# Patient Record
Sex: Female | Born: 1940 | Race: White | Hispanic: No | Marital: Married | Smoking: Never smoker
Health system: Southern US, Community
[De-identification: ages and names within clinical notes are randomized; demographics above are authoritative.]

## PROBLEM LIST (undated history)

## (undated) DIAGNOSIS — N3090 Cystitis, unspecified without hematuria: Secondary | ICD-10-CM

## (undated) DIAGNOSIS — E669 Obesity, unspecified: Secondary | ICD-10-CM

## (undated) DIAGNOSIS — Z9889 Other specified postprocedural states: Secondary | ICD-10-CM

## (undated) DIAGNOSIS — B379 Candidiasis, unspecified: Secondary | ICD-10-CM

## (undated) DIAGNOSIS — C50919 Malignant neoplasm of unspecified site of unspecified female breast: Secondary | ICD-10-CM

## (undated) DIAGNOSIS — R112 Nausea with vomiting, unspecified: Secondary | ICD-10-CM

## (undated) DIAGNOSIS — T4145XA Adverse effect of unspecified anesthetic, initial encounter: Secondary | ICD-10-CM

## (undated) DIAGNOSIS — T8859XA Other complications of anesthesia, initial encounter: Secondary | ICD-10-CM

## (undated) DIAGNOSIS — E039 Hypothyroidism, unspecified: Secondary | ICD-10-CM

## (undated) DIAGNOSIS — M069 Rheumatoid arthritis, unspecified: Secondary | ICD-10-CM

## (undated) DIAGNOSIS — K219 Gastro-esophageal reflux disease without esophagitis: Secondary | ICD-10-CM

## (undated) DIAGNOSIS — M1990 Unspecified osteoarthritis, unspecified site: Secondary | ICD-10-CM

## (undated) DIAGNOSIS — T7840XA Allergy, unspecified, initial encounter: Secondary | ICD-10-CM

## (undated) DIAGNOSIS — C801 Malignant (primary) neoplasm, unspecified: Secondary | ICD-10-CM

## (undated) DIAGNOSIS — I5189 Other ill-defined heart diseases: Secondary | ICD-10-CM

## (undated) DIAGNOSIS — R0600 Dyspnea, unspecified: Secondary | ICD-10-CM

## (undated) DIAGNOSIS — R000 Tachycardia, unspecified: Secondary | ICD-10-CM

## (undated) DIAGNOSIS — IMO0001 Reserved for inherently not codable concepts without codable children: Secondary | ICD-10-CM

## (undated) DIAGNOSIS — I10 Essential (primary) hypertension: Secondary | ICD-10-CM

## (undated) DIAGNOSIS — M797 Fibromyalgia: Secondary | ICD-10-CM

## (undated) DIAGNOSIS — G609 Hereditary and idiopathic neuropathy, unspecified: Principal | ICD-10-CM

## (undated) HISTORY — DX: Other ill-defined heart diseases: I51.89

## (undated) HISTORY — PX: OTHER SURGICAL HISTORY: SHX169

## (undated) HISTORY — DX: Tachycardia, unspecified: R00.0

## (undated) HISTORY — PX: FINGER SURGERY: SHX640

## (undated) HISTORY — DX: Essential (primary) hypertension: I10

## (undated) HISTORY — PX: EYE SURGERY: SHX253

## (undated) HISTORY — DX: Reserved for inherently not codable concepts without codable children: IMO0001

## (undated) HISTORY — PX: BREAST SURGERY: SHX581

## (undated) HISTORY — PX: LUMBAR FUSION: SHX111

## (undated) HISTORY — DX: Malignant (primary) neoplasm, unspecified: C80.1

## (undated) HISTORY — DX: Rheumatoid arthritis, unspecified: M06.9

## (undated) HISTORY — DX: Obesity, unspecified: E66.9

## (undated) HISTORY — PX: TONSILLECTOMY: SUR1361

## (undated) HISTORY — PX: LUMBAR LAMINECTOMY: SHX95

## (undated) HISTORY — DX: Gastro-esophageal reflux disease without esophagitis: K21.9

## (undated) HISTORY — DX: Hereditary and idiopathic neuropathy, unspecified: G60.9

## (undated) HISTORY — PX: BELPHAROPTOSIS REPAIR: SHX369

---

## 1999-02-23 ENCOUNTER — Other Ambulatory Visit: Admission: RE | Admit: 1999-02-23 | Discharge: 1999-02-23 | Payer: Self-pay | Admitting: Obstetrics and Gynecology

## 2000-03-15 ENCOUNTER — Other Ambulatory Visit: Admission: RE | Admit: 2000-03-15 | Discharge: 2000-03-15 | Payer: Self-pay | Admitting: *Deleted

## 2001-03-19 ENCOUNTER — Other Ambulatory Visit: Admission: RE | Admit: 2001-03-19 | Discharge: 2001-03-19 | Payer: Self-pay | Admitting: *Deleted

## 2002-04-03 ENCOUNTER — Other Ambulatory Visit: Admission: RE | Admit: 2002-04-03 | Discharge: 2002-04-03 | Payer: Self-pay | Admitting: *Deleted

## 2003-07-06 ENCOUNTER — Other Ambulatory Visit: Admission: RE | Admit: 2003-07-06 | Discharge: 2003-07-06 | Payer: Self-pay | Admitting: *Deleted

## 2005-01-24 HISTORY — PX: COLONOSCOPY: SHX174

## 2005-02-22 ENCOUNTER — Ambulatory Visit (HOSPITAL_COMMUNITY): Admission: RE | Admit: 2005-02-22 | Discharge: 2005-02-22 | Payer: Self-pay | Admitting: Gastroenterology

## 2005-02-22 ENCOUNTER — Encounter (INDEPENDENT_AMBULATORY_CARE_PROVIDER_SITE_OTHER): Payer: Self-pay | Admitting: *Deleted

## 2006-11-14 ENCOUNTER — Encounter: Admission: RE | Admit: 2006-11-14 | Discharge: 2006-11-14 | Payer: Self-pay | Admitting: Family Medicine

## 2006-12-06 ENCOUNTER — Encounter: Admission: RE | Admit: 2006-12-06 | Discharge: 2006-12-06 | Payer: Self-pay | Admitting: Family Medicine

## 2007-03-12 ENCOUNTER — Encounter (INDEPENDENT_AMBULATORY_CARE_PROVIDER_SITE_OTHER): Payer: Self-pay | Admitting: Specialist

## 2007-03-12 ENCOUNTER — Ambulatory Visit (HOSPITAL_BASED_OUTPATIENT_CLINIC_OR_DEPARTMENT_OTHER): Admission: RE | Admit: 2007-03-12 | Discharge: 2007-03-12 | Payer: Self-pay | Admitting: Orthopedic Surgery

## 2008-01-09 ENCOUNTER — Ambulatory Visit: Payer: Self-pay | Admitting: Oncology

## 2010-01-31 ENCOUNTER — Encounter: Admission: RE | Admit: 2010-01-31 | Discharge: 2010-01-31 | Payer: Self-pay | Admitting: Neurosurgery

## 2010-06-03 ENCOUNTER — Encounter: Admission: RE | Admit: 2010-06-03 | Discharge: 2010-06-03 | Payer: Self-pay | Admitting: Internal Medicine

## 2010-10-26 ENCOUNTER — Ambulatory Visit: Payer: Self-pay | Admitting: Genetic Counselor

## 2011-04-13 NOTE — Op Note (Signed)
NAMESTARLYN, DROGE             ACCOUNT NO.:  [id number]   MEDICAL RECORD NO.:  [id number]          PATIENT TYPE:  AMB   LOCATION:  ENDO                         FACILITY:  [facility]   PHYSICIAN:  Bernette Redbird, M.D.   DATE OF BIRTH:  04/25/1941   DATE OF PROCEDURE:  02/22/2005  DATE OF DISCHARGE:                                 OPERATIVE REPORT   PROCEDURE:  Upper endoscopy with biopsies.   INDICATIONS:  70 year old female with intensified reflux symptoms recently,  and having necessitated the use of Prilosec OTC in the past few months  whereas previously, over-the-counter remedies were sufficient.   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   PROCEDURE:  The nature, purpose and risks of the procedure had been  discussed with the patient who provided written consent. Sedation was  fentanyl 60 mcg and Versed 6 milligrams IV without arrhythmias or  desaturation. The Olympus small-caliber adult video endoscope was passed  under direct vision. The vocal cords looked normal. The esophagus was  readily entered and had normal mucosa except at the GE junction where there  appeared to be a 1-cm tongue of Barrett's mucosa extending from the region  of the lower esophageal sphincter. I could not get the distal esophagus to  relax fully to determine whether this was actually just some slight  irregularity of the Z-line, versus a true tongue of Barrett's mucosa, so I  took a couple of biopsies from it to look for evidence of intestinal  metaplasia. No ring, stricture, varices, infection or neoplasia were  observed in the esophagus nor was there any hiatal hernia.   The stomach contained no significant residual and had normal mucosa without  evidence of gastritis, erosions, ulcers, polyps or masses and the pylorus,  duodenal bulb and proximal second duodenum looked normal. Retroflexed  viewing of the cardia was unremarkable.   The patient tolerated the procedure well and there  were no apparent  complications.   IMPRESSION:  1.  Reflux, without active inflammation seen at the time of this exam      (530.81).  2.  Possible short tongue of columnar mucosa (? Barrett's esophagus), path      pending.   PLAN:  Await pathology results. In the meantime, continue PPI therapy as  needed to control symptoms.      RB/MEDQ  D:  02/22/2005  T:  02/22/2005  Job:  [id number]   cc:   Donia Guiles, M.D.  [address] [place]  [place] [address]  Fax: [phone]

## 2011-04-13 NOTE — Op Note (Signed)
[name], [name]             ACCOUNT NO.:  [id number]   MEDICAL RECORD NO.:  [id number]          PATIENT TYPE:  AMB   LOCATION:  ENDO                         FACILITY:  [facility]   PHYSICIAN:  Bernette Redbird, M.D.   DATE OF BIRTH:  04/23/1941   DATE OF PROCEDURE:  02/22/2005  DATE OF DISCHARGE:                                 OPERATIVE REPORT   PROCEDURE:  Colonoscopy.   INDICATIONS:  Screening for colon cancer in a standard risk individual  without risk factors or symptoms. A flexible sigmoidoscopy several years ago  was reportedly negative.   FINDINGS:  Normal exam to the cecum.   PROCEDURE:  The nature, purpose and risks of the procedure had been  discussed with the patient who provided written consent. Sedation for this  procedure and the upper endoscopy which preceded it totalled fentanyl 80 mcg  and Versed 8 mg IV without arrhythmias or desaturation. Perianal exam was  unremarkable. Despite the fact that the patient had had what sounds like  some prolapsed hemorrhoids during the course of her prep last night.   The Olympus adult video colonoscope was advanced around the colon to the  cecum as identified by clear visualization of the appendiceal orifice and  pullback was then performed. The quality of prep was excellent and it was  felt that all areas were well seen.   This was a normal examination. No polyps, cancer, colitis, vascular  malformations or diverticulosis were noted and retroflexion in the rectum  and reinspection of the rectum was unremarkable. No biopsies were obtained.  The patient tolerated the procedure well and there no apparent  complications.   IMPRESSION:  Normal screening colonoscopy in a patient without worrisome  risk factors or symptoms (V76.51).   PLAN:  Follow-up sigmoidoscopy (versus colonoscopy) in five years for  continued screening.      RB/MEDQ  D:  02/22/2005  T:  02/22/2005  Job:  [id number]   cc:   Donia Guiles, M.D.  [address] [place]  [place] [address]  Fax: [phone]

## 2011-04-13 NOTE — Op Note (Signed)
NAMEMADALYNE, HUSK             ACCOUNT NO.:  [id number]   MEDICAL RECORD NO.:  [id number]          PATIENT TYPE:  AMB   LOCATION:  DSC                          FACILITY:  [facility]   PHYSICIAN:  Cindee Salt, M.D.       DATE OF BIRTH:  April 27, 1941   DATE OF PROCEDURE:  03/12/2007  DATE OF DISCHARGE:                               OPERATIVE REPORT   PREOPERATIVE DIAGNOSIS:  Mucoid cyst left index finger.   POSTOPERATIVE DIAGNOSIS:  Mucoid cyst left index finger.   OPERATION:  Excision mucoid cyst left index finger.   SURGEON:  Cindee Salt, M.D.   ASSISTANT:  Carolyne Fiscal R.N.   ANESTHESIA:  Forearm based IV regional.   HISTORY:  The patient is a 70 year old female with a history large cyst  over the dorsal aspect distal interphalangeal joint left index finger.  She is desirous of removal.  She is aware of risks and complications  including infection, recurrence, injury to arteries, nerves, tendons  incomplete relief of symptoms, dystrophy, possibility of arthritic  changes, stiffness of the joint following the surgery.  She is desirous  of proceeding to have this excised in the joint debrided in effort to  prevent recurrence.  In the preoperative area questions were encouraged  and answered.  The extremity marked by both the patient and surgeon.  Antibiotic given.   PROCEDURE:  The patient is brought to the operating room where a forearm  based IV regional anesthetic was carried out without difficulty.  She  was prepped using DuraPrep, supine position, left arm free.  A  curvilinear incision was made mid laterally, carried across dorsal  aspect of the DIP joint, carried down through subcutaneous tissue.  Bleeders were electrocauterized.  The cyst was immediately encountered.  With blunt and sharp dissection, this was dissected free and sent to  pathology.  The joint was then opened on its radial aspect.  Large  osteophyte was present.  This was removed with a rongeur.  A synovectomy  performed.  The joint debrided with the rongeur.  This was then  irrigated.  The skin was then closed interrupted 5-0 Vicryl Rapide  suture.  A sterile compressive dressing splint to the finger applied.  The patient tolerated the procedure well, was taken to the recovery  observation in satisfactory condition.  She is discharged home to return  to [facility] [place] in one week on Vicodin.           ______________________________  Cindee Salt, M.D.     GK/MEDQ  D:  03/12/2007  T:  03/12/2007  Job:  [id number]   cc:   Donia Guiles, M.D.

## 2012-06-25 ENCOUNTER — Other Ambulatory Visit: Payer: Self-pay | Admitting: Neurosurgery

## 2012-06-25 DIAGNOSIS — M5137 Other intervertebral disc degeneration, lumbosacral region: Secondary | ICD-10-CM

## 2012-06-25 DIAGNOSIS — M47817 Spondylosis without myelopathy or radiculopathy, lumbosacral region: Secondary | ICD-10-CM

## 2012-06-25 DIAGNOSIS — IMO0002 Reserved for concepts with insufficient information to code with codable children: Secondary | ICD-10-CM

## 2012-06-25 DIAGNOSIS — M4310 Spondylolisthesis, site unspecified: Secondary | ICD-10-CM

## 2012-06-26 ENCOUNTER — Ambulatory Visit
Admission: RE | Admit: 2012-06-26 | Discharge: 2012-06-26 | Disposition: A | Discharge status: Home / Self Care | Payer: Medicare Other | Source: Ambulatory Visit | Attending: Neurosurgery | Admitting: Neurosurgery

## 2012-06-26 ENCOUNTER — Other Ambulatory Visit: Payer: Self-pay | Admitting: Neurosurgery

## 2012-06-26 DIAGNOSIS — M4310 Spondylolisthesis, site unspecified: Secondary | ICD-10-CM

## 2012-06-26 DIAGNOSIS — IMO0002 Reserved for concepts with insufficient information to code with codable children: Secondary | ICD-10-CM

## 2012-06-26 DIAGNOSIS — M5137 Other intervertebral disc degeneration, lumbosacral region: Secondary | ICD-10-CM

## 2012-06-26 DIAGNOSIS — M47817 Spondylosis without myelopathy or radiculopathy, lumbosacral region: Secondary | ICD-10-CM

## 2012-07-01 ENCOUNTER — Other Ambulatory Visit: Payer: Self-pay

## 2012-07-02 ENCOUNTER — Ambulatory Visit
Admission: RE | Admit: 2012-07-02 | Discharge: 2012-07-02 | Disposition: A | Discharge status: Home / Self Care | Payer: Medicare Other | Source: Ambulatory Visit | Attending: Neurosurgery | Admitting: Neurosurgery

## 2012-07-02 DIAGNOSIS — M4310 Spondylolisthesis, site unspecified: Secondary | ICD-10-CM

## 2012-07-02 DIAGNOSIS — M5137 Other intervertebral disc degeneration, lumbosacral region: Secondary | ICD-10-CM

## 2012-07-02 DIAGNOSIS — M47817 Spondylosis without myelopathy or radiculopathy, lumbosacral region: Secondary | ICD-10-CM

## 2012-07-02 DIAGNOSIS — IMO0002 Reserved for concepts with insufficient information to code with codable children: Secondary | ICD-10-CM

## 2012-07-02 MED ORDER — GADOBENATE DIMEGLUMINE 529 MG/ML IV SOLN
15.0000 mL | Freq: Once | INTRAVENOUS | Status: AC | PRN
  Administered 2012-07-02: 15 mL via INTRAVENOUS

## 2012-12-29 ENCOUNTER — Ambulatory Visit: Payer: Medicare Other | Attending: Internal Medicine | Admitting: Physical Therapy

## 2012-12-29 DIAGNOSIS — R269 Unspecified abnormalities of gait and mobility: Secondary | ICD-10-CM | POA: Insufficient documentation

## 2012-12-29 DIAGNOSIS — IMO0001 Reserved for inherently not codable concepts without codable children: Secondary | ICD-10-CM | POA: Insufficient documentation

## 2014-03-31 ENCOUNTER — Encounter: Payer: Self-pay | Admitting: General Surgery

## 2014-03-31 DIAGNOSIS — I498 Other specified cardiac arrhythmias: Secondary | ICD-10-CM | POA: Insufficient documentation

## 2014-03-31 DIAGNOSIS — R0602 Shortness of breath: Secondary | ICD-10-CM

## 2014-03-31 DIAGNOSIS — I10 Essential (primary) hypertension: Secondary | ICD-10-CM | POA: Insufficient documentation

## 2014-04-07 ENCOUNTER — Ambulatory Visit: Payer: Medicare Other | Admitting: Cardiology

## 2014-04-13 ENCOUNTER — Ambulatory Visit: Payer: Medicare Other | Admitting: Cardiology

## 2014-04-15 ENCOUNTER — Ambulatory Visit (INDEPENDENT_AMBULATORY_CARE_PROVIDER_SITE_OTHER): Payer: Medicare Other | Admitting: Cardiology

## 2014-04-15 ENCOUNTER — Encounter: Payer: Self-pay | Admitting: Cardiology

## 2014-04-15 VITALS — BP 144/83 | HR 83 | Ht 59.0 in | Wt 169.0 lb

## 2014-04-15 DIAGNOSIS — I5189 Other ill-defined heart diseases: Secondary | ICD-10-CM

## 2014-04-15 DIAGNOSIS — I10 Essential (primary) hypertension: Secondary | ICD-10-CM

## 2014-04-15 DIAGNOSIS — I519 Heart disease, unspecified: Secondary | ICD-10-CM

## 2014-04-15 DIAGNOSIS — R000 Tachycardia, unspecified: Secondary | ICD-10-CM | POA: Insufficient documentation

## 2014-04-15 NOTE — Patient Instructions (Signed)
Your physician recommends that you continue on your current medications as directed. Please refer to the Current Medication list given to you today.  Your physician wants you to follow-up in: 1 year with Dr. Turner. You will receive a reminder letter in the mail two months in advance. If you don't receive a letter, please call our office to schedule the follow-up appointment.  

## 2014-04-15 NOTE — Progress Notes (Signed)
  [address] [place], [place]  [address] Phone: [phone] Fax:  [phone]  Date:  04/15/2014   ID:  Montine, Hight September 24, 1941, MRN [id number]  PCP:  [name], MD  Cardiologist:  Fransico Him, MD     History of Present Illness: Audrey [name] is a 73 y.o. 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  She is doing well.  She denies any chest pain, SOB, DOE, LE edema, dizziness, palpitations or syncope.   Wt Readings from Last 3 Encounters:  04/15/14 169 lb (76.658 kg)     Past Medical History  Diagnosis Date  . 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 Obesity   . Reflux     buring and dysphagia sx resp to PPI; egd 3/06 neg for Barretts  . Hypertension   . Diastolic dysfunction     Current Outpatient Prescriptions  Medication Sig Dispense Refill  . Biotin 10 MG CAPS Take by mouth daily.      . Calcium Carb-Cholecalciferol (CALCIUM + D3) 600-200 MG-UNIT TABS Take by mouth daily.      . Cholecalciferol (VITAMIN D3) 2000 UNITS CHEW Chew by mouth.      Marland Kitchen Co-Enzyme Q10 200 MG CAPS Take by mouth.      . Cranberry 200 MG CAPS Take by mouth daily.      . hydrochlorothiazide (MICROZIDE) 12.5 MG capsule Take 12.5 mg by mouth daily.      Marland Kitchen losartan (COZAAR) 50 MG tablet Take 50 mg by mouth daily.      . nebivolol (BYSTOLIC) 5 MG tablet Take 5 mg by mouth daily.      Marland Kitchen omeprazole (PRILOSEC) 20 MG capsule Take 20 mg by mouth daily.      . rosuvastatin (CRESTOR) 20 MG tablet Take 20 mg by mouth. Once a week       No current facility-administered medications for this visit.    Allergies:    Allergies  Allergen Reactions  . Macrodantin [Nitrofurantoin] Rash    Social History:  The patient  reports that she has never smoked. She does not have any smokeless tobacco history on file. She reports that she does not drink alcohol or use  illicit drugs.   Family History:  The patient's family history is not on file.   ROS:  Please see the history of present illness.      All other systems reviewed and negative.   PHYSICAL EXAM: VS:  BP 144/83  Pulse 83  Ht 4\' 11"  (1.499 m)  Wt 169 lb (76.658 kg)  BMI 34.12 kg/m2 Well nourished, well developed, in no acute distress HEENT: normal Neck: no JVD Cardiac:  normal S1, S2; RRR; no murmur Lungs:  clear to auscultation bilaterally, no wheezing, rhonchi or rales Abd: soft, nontender, no hepatomegaly Ext: no edema Skin: warm and dry Neuro:  CNs 2-12 intact, no focal abnormalities noted     ASSESSMENT AND PLAN:  1. Inappropriate sinus tachycardia suppressed on beta blockers - continue Bystolic 2. HTN well controlled - continue HCTZ and Bystolic  Followup with me in 1 year  Signed, Fransico Him, MD 04/15/2014 11:21 AM

## 2014-08-26 ENCOUNTER — Other Ambulatory Visit: Payer: Self-pay | Admitting: *Deleted

## 2014-08-26 MED ORDER — HYDROCHLOROTHIAZIDE 12.5 MG PO CAPS: 12.5000 mg | ORAL_CAPSULE | Freq: Every day | ORAL | Status: DC

## 2015-01-17 ENCOUNTER — Encounter: Payer: Self-pay | Admitting: Cardiology

## 2015-02-02 ENCOUNTER — Encounter: Payer: Self-pay | Admitting: Neurology

## 2015-02-02 ENCOUNTER — Ambulatory Visit (INDEPENDENT_AMBULATORY_CARE_PROVIDER_SITE_OTHER): Payer: PPO | Admitting: Neurology

## 2015-02-02 VITALS — BP 148/84 | HR 97 | Ht 59.0 in | Wt 168.0 lb

## 2015-02-02 DIAGNOSIS — E538 Deficiency of other specified B group vitamins: Secondary | ICD-10-CM

## 2015-02-02 DIAGNOSIS — R269 Unspecified abnormalities of gait and mobility: Secondary | ICD-10-CM | POA: Diagnosis not present

## 2015-02-02 DIAGNOSIS — M797 Fibromyalgia: Secondary | ICD-10-CM | POA: Diagnosis not present

## 2015-02-02 NOTE — Patient Instructions (Signed)

## 2015-02-02 NOTE — Progress Notes (Signed)
Reason for visit: Gait disturbance  Audrey [name] is a 74 y.o. female  History of present illness:  Ms. Hoggard is a 74 year old right-handed white female with a history of a mild gait disorder that has been present for least 2 years. The patient has had 2 falls in 2 years, the last fall occurred in July 2015. The patient indicated that she fell without much warning, toppling over. The patient goes on to say that she is involved with yoga, and she is having problems with balancing on one leg or the other during these sessions. She reports a greater than two-year history of some numbness in the toes of the feet, she denies any burning or stinging in the feet at night. She has a history of low back surgery, and some low back pain at this point. She denies any sciatica type pain down either leg. She denies any issues controlling the bowels or the bladder. The strength in the legs is relatively good, but she does report some pain in the right knee from a meniscal tear previously. She is sent to this office for further evaluation of the walking problems. She is referred by Dr. Polly Cobia. She denies any dizziness or sensation of syncope prior to the falls. She reports a history of diffuse neuromuscular discomfort in the arms, shoulders, back, and legs. She is also concerned about this issue.  Past Medical History  Diagnosis Date  . 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 Obesity   . Reflux     buring and dysphagia sx resp to PPI; egd 3/06 neg for Barretts  . Hypertension   . Diastolic dysfunction   . Abnormality of gait 02/02/2015    Past Surgical History  Procedure Laterality Date  . Colonoscopy  3/06    NEG  . Lumbar laminectomy    . Belpharoptosis repair Bilateral     Family History  Problem Relation Age of Onset  . Cancer Mother     breast  . Cancer Father     Hodgkin's disease  . Down syndrome Sister   . Alcohol abuse Brother      Social history:  reports that she has never smoked. She does not have any smokeless tobacco history on file. She reports that she does not drink alcohol or use illicit drugs.  Medications:  Prior to Admission medications   Medication Sig Start Date End Date Taking? Authorizing Provider  aspirin 81 MG tablet Take 81 mg by mouth daily.   Yes Historical Provider, MD  Biotin 10 MG CAPS Take by mouth daily.   Yes Historical Provider, MD  Calcium Carb-Cholecalciferol (CALCIUM + D3) 600-200 MG-UNIT TABS Take by mouth daily.   Yes Historical Provider, MD  Cholecalciferol (VITAMIN D3) 2000 UNITS CHEW Chew by mouth.   Yes Historical Provider, MD  Co-Enzyme Q10 200 MG CAPS Take by mouth.   Yes Historical Provider, MD  Cranberry 200 MG CAPS Take by mouth daily.   Yes Historical Provider, MD  hydrochlorothiazide (MICROZIDE) 12.5 MG capsule Take 1 capsule (12.5 mg total) by mouth daily. 08/26/14  Yes Sueanne Margarita, MD  losartan (COZAAR) 50 MG tablet Take 50 mg by mouth daily.   Yes Historical Provider, MD  Multiple Vitamins-Minerals (PRESERVISION AREDS 2) CAPS Take by mouth.   Yes Historical Provider, MD  omeprazole (PRILOSEC) 20 MG capsule Take 20 mg by mouth daily.   Yes Historical Provider, MD  rosuvastatin (  CRESTOR) 20 MG tablet Take 20 mg by mouth. Once a week   Yes Historical Provider, MD  PREMARIN vaginal cream  01/03/15   Historical Provider, MD      Allergies  Allergen Reactions  . Cymbalta [Duloxetine Hcl]     Diarrhea  . Macrodantin [Nitrofurantoin] Rash    ROS:  Out of a complete 14 system review of symptoms, the patient complains only of the following symptoms, and all other reviewed systems are negative.  Cold intolerance Food allergies Joint pain, joint swelling, back pain, achy muscles, walking difficulties Numbness  Blood pressure 148/84, pulse 97, height 4\' 11"  (1.499 m), weight 168 lb (76.204 kg).  Physical Exam  General: The patient is alert and cooperative at the  time of the examination.  Eyes: Pupils are equal, round, and reactive to light. Discs are flat bilaterally.  Neck: The neck is supple, no carotid bruits are noted.  Respiratory: The respiratory examination is clear.  Cardiovascular: The cardiovascular examination reveals a regular rate and rhythm, no obvious murmurs or rubs are noted.  Skin: Extremities are without significant edema.  Neurologic Exam  Mental status: The patient is alert and oriented x 3 at the time of the examination. The patient has apparent normal recent and remote memory, with an apparently normal attention span and concentration ability.  Cranial nerves: Facial symmetry is present. There is good sensation of the face to pinprick and soft touch bilaterally. The strength of the facial muscles and the muscles to head turning and shoulder shrug are normal bilaterally. Speech is well enunciated, no aphasia or dysarthria is noted. Extraocular movements are full. Visual fields are full. The tongue is midline, and the patient has symmetric elevation of the soft palate. No obvious hearing deficits are noted.  Motor: The motor testing reveals 5 over 5 strength of all 4 extremities. Good symmetric motor tone is noted throughout.  Sensory: Sensory testing is intact to pinprick, soft touch, vibration sensation, and position sense on all 4 extremities. No evidence of extinction is noted.  Coordination: Cerebellar testing reveals good finger-nose-finger and heel-to-shin bilaterally.  Gait and station: Gait is normal. Tandem gait is normal. Romberg is negative. No drift is seen.  Reflexes: Deep tendon reflexes are symmetric and normal bilaterally. Toes are downgoing bilaterally.   Assessment/Plan:  1. Mild gait disturbance  2. Diffuse neuromuscular pain, possible fibromyalgia  The patient reports some numbness in the feet, a mild peripheral neuropathy does need to be considered. Lumbosacral spinal stenosis could also produce  similar symptoms. She will undergo blood work today, and have nerve conduction studies done on both legs, EMG evaluation on the right leg. She has diffuse neuromuscular discomfort, she could also have an underlying fibromyalgia syndrome. She will follow-up for the EMG evaluation. In the past, she could not tolerate Cymbalta secondary to severe diarrhea.  Jill Alexanders MD 02/02/2015 8:44 PM  [facility] [address] [place], [place] [address]  Phone [phone] Fax [phone]

## 2015-02-04 LAB — B. BURGDORFI ANTIBODIES: Lyme IgG/IgM Ab: <0.91 {ISR} (ref 0.00–0.90)

## 2015-02-04 LAB — MULTIPLE MYELOMA PANEL, SERUM
ALBUMIN SERPL ELPH-MCNC: 4.1 g/dL (ref 3.2–5.6)
ALPHA2 GLOB SERPL ELPH-MCNC: 0.7 g/dL (ref 0.4–1.2)
Albumin/Glob SerPl: 1.4 (ref 0.7–2.0)
Alpha 1: 0.3 g/dL (ref 0.1–0.4)
B-GLOBULIN SERPL ELPH-MCNC: 1 g/dL (ref 0.6–1.3)
GAMMA GLOB SERPL ELPH-MCNC: 1 g/dL (ref 0.5–1.6)
GLOBULIN, TOTAL: 3 g/dL (ref 2.0–4.5)
IGA/IMMUNOGLOBULIN A, SERUM: 231 mg/dL (ref 91–414)
IGG (IMMUNOGLOBIN G), SERUM: 1003 mg/dL (ref 700–1600)
IgM (Immunoglobulin M), Srm: 109 mg/dL (ref 40–230)
Total Protein: 7.1 g/dL (ref 6.0–8.5)

## 2015-02-04 LAB — ANGIOTENSIN CONVERTING ENZYME: ANGIO CONVERT ENZYME: 36 U/L (ref 14–82)

## 2015-02-04 LAB — VITAMIN B12

## 2015-02-04 LAB — RHEUMATOID FACTOR: Rhuematoid fact SerPl-aCnc: 8.3 IU/mL (ref 0.0–13.9)

## 2015-02-04 LAB — ANA W/REFLEX: Anti Nuclear Antibody(ANA): NEGATIVE

## 2015-02-04 LAB — COPPER, SERUM: COPPER: 120 ug/dL (ref 72–166)

## 2015-02-10 ENCOUNTER — Ambulatory Visit (INDEPENDENT_AMBULATORY_CARE_PROVIDER_SITE_OTHER): Payer: PPO | Admitting: Neurology

## 2015-02-10 ENCOUNTER — Ambulatory Visit (INDEPENDENT_AMBULATORY_CARE_PROVIDER_SITE_OTHER): Payer: Self-pay | Admitting: Neurology

## 2015-02-10 ENCOUNTER — Encounter: Payer: Self-pay | Admitting: Neurology

## 2015-02-10 DIAGNOSIS — M797 Fibromyalgia: Secondary | ICD-10-CM

## 2015-02-10 DIAGNOSIS — G609 Hereditary and idiopathic neuropathy, unspecified: Secondary | ICD-10-CM | POA: Insufficient documentation

## 2015-02-10 DIAGNOSIS — R269 Unspecified abnormalities of gait and mobility: Secondary | ICD-10-CM | POA: Diagnosis not present

## 2015-02-10 HISTORY — DX: Hereditary and idiopathic neuropathy, unspecified: G60.9

## 2015-02-10 MED ORDER — GABAPENTIN 100 MG PO CAPS: ORAL_CAPSULE | ORAL | Status: DC

## 2015-02-10 NOTE — Progress Notes (Signed)
Please refer to EMG and nerve conduction study procedure note.  The patient reports some discomfort in the feet with cold sensations and itching. The patient will be placed on gabapentin in low dose, she could not tolerate Cymbalta previously. She has had blood work done that was unremarkable looking for the etiology of her neuropathy issue.  The patient will follow-up in 4 months for an evaluation. She will call office if she is not tolerating medication well.

## 2015-02-10 NOTE — Procedures (Signed)
     HISTORY:  Audrey [name] is a 74 year old patient with a history of a gait disturbance over the last 2 years associated with occasional falls. The patient reports some sensory alteration in the feet, she is being evaluated for a possible peripheral neuropathy. She also reports some issues with chronic low back pain, prior history of lumbosacral spine surgery.  NERVE CONDUCTION STUDIES:  Nerve conduction studies were performed on the right upper extremity. The distal motor latencies and motor amplitudes for the median and ulnar nerves were within normal limits. The F wave latencies and nerve conduction velocities for these nerves were also normal. The sensory latencies for the median and ulnar nerves were normal.  Nerve conduction studies were performed on both lower extremities. The distal motor latencies for the peroneal and posterior tibial nerves were within normal limits bilaterally, with a low motor amplitude for the left peroneal nerve and for the posterior tibial nerves bilaterally. The motor amplitude for the right peroneal nerve was normal. Nerve conduction velocities for the peroneal and posterior tibial nerves were normal. The H reflex latencies were symmetric and normal, the peroneal sensory latencies were absent bilaterally, and the medial and lateral plantar sensory latencies were unobtainable bilaterally.  EMG STUDIES:  EMG study was performed on the left lower extremity:  The tibialis anterior muscle reveals 2 to 5K motor units with full recruitment. No fibrillations or positive waves were seen. The peroneus tertius muscle reveals 2 to 6K motor units with decreased recruitment. No fibrillations or positive waves were seen. The medial gastrocnemius muscle reveals 2 to 5K motor units with full recruitment. No fibrillations or positive waves were seen. The vastus lateralis muscle reveals 2 to 4K motor units with full recruitment. No fibrillations or positive waves were  seen. The iliopsoas muscle reveals 2 to 4K motor units with full recruitment. No fibrillations or positive waves were seen. The biceps femoris muscle (long head) reveals 2 to 4K motor units with full recruitment. No fibrillations or positive waves were seen. The lumbosacral paraspinal muscles were tested at 3 levels, and revealed no abnormalities of insertional activity at all 3 levels tested. There was good relaxation.   IMPRESSION:  Nerve conduction studies done on the right upper extremity and both lower extremities shows evidence of a primarily axonal peripheral neuropathy of moderate severity. EMG evaluation of the left lower extremity shows mild chronic stable denervation below the knee consistent with the diagnosis of a peripheral neuropathy. No evidence of an overlying lumbosacral radiculopathy is seen.  Jill Alexanders MD 02/10/2015 11:35 AM  [facility] [address] [place], [place] [address]  Phone [phone] Fax [phone]

## 2015-02-10 NOTE — Progress Notes (Signed)
Please refer to EMG and nerve conduction study procedure note. 

## 2015-03-22 ENCOUNTER — Ambulatory Visit (INDEPENDENT_AMBULATORY_CARE_PROVIDER_SITE_OTHER): Payer: PPO | Admitting: Cardiology

## 2015-03-22 ENCOUNTER — Encounter: Payer: Self-pay | Admitting: Cardiology

## 2015-03-22 VITALS — BP 130/72 | HR 84 | Ht 59.0 in | Wt 167.8 lb

## 2015-03-22 DIAGNOSIS — R000 Tachycardia, unspecified: Secondary | ICD-10-CM

## 2015-03-22 DIAGNOSIS — I10 Essential (primary) hypertension: Secondary | ICD-10-CM | POA: Diagnosis not present

## 2015-03-22 NOTE — Patient Instructions (Signed)

## 2015-03-22 NOTE — Progress Notes (Signed)
Cardiology Office Note   Date:  03/22/2015   ID:  Audrey [name], Audrey [name] 20-Mar-1941, MRN [id number]  PCP:  [name], MD    Chief Complaint  Patient presents with  . Tachycardia  . Hypertension      History of Present Illness: MACY POLIO is a 74 y.o. female with a history of inappropriate sinus tachycardia stable and had been on beta blockers but she was having a lot of fatigue and sleepiness and the BB was stopped and she was started on Losartan for HTN.  She was diagnosed with fibromyalgia and is on gabapentin and feels great.  She presents today for followup. She is doing well. She denies any chest pain, SOB, DOE, LE edema, dizziness, palpitations or syncope.     Past Medical History  Diagnosis Date  . 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 Obesity   . Reflux     buring and dysphagia sx resp to PPI; egd 3/06 neg for Barretts  . Hypertension   . Diastolic dysfunction   . Abnormality of gait 02/02/2015  . 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 Colonoscopy  3/06    NEG  . Lumbar laminectomy    . Belpharoptosis repair Bilateral      Current Outpatient Prescriptions  Medication Sig Dispense Refill  . aspirin 81 MG tablet Take 81 mg by mouth daily.    . Calcium Carb-Cholecalciferol (CALCIUM + D3) 600-200 MG-UNIT TABS Take by mouth daily.    . Cholecalciferol (VITAMIN D3) 2000 UNITS CHEW Chew by mouth.    Marland Kitchen Co-Enzyme Q10 200 MG CAPS Take by mouth.    . Cranberry 200 MG CAPS Take by mouth daily.    . Esomeprazole Magnesium (NEXIUM PO) Take 1 capsule by mouth daily.    Marland Kitchen gabapentin (NEURONTIN) 100 MG capsule One capsule 3 times a day for 2 weeks, then take 2 capsules 3 times a day 180 capsule 2  . hydrochlorothiazide (MICROZIDE) 12.5 MG capsule Take 1 capsule (12.5 mg total) by mouth daily. 90 capsule 1  . losartan (COZAAR) 50 MG tablet  Take 50 mg by mouth daily.    . Multiple Vitamins-Minerals (PRESERVISION AREDS 2) CAPS Take by mouth.    Marland Kitchen PREMARIN vaginal cream   11  . rosuvastatin (CRESTOR) 20 MG tablet Take 20 mg by mouth. Once a week     No current facility-administered medications for this visit.    Allergies:   Cymbalta and Macrodantin    Social History:  The patient  reports that she has never smoked. She does not have any smokeless tobacco history on file. She reports that she does not drink alcohol or use illicit drugs.   Family History:  The patient's family history includes Alcohol abuse in her brother; Cancer in her father and mother; Down syndrome in her sister.    ROS:  Please see the history of present illness.   Otherwise, review of systems are positive for none.   All other systems are reviewed and negative.    PHYSICAL EXAM: VS:  BP 130/72 mmHg  Pulse 84  Ht 4\' 11"  (1.499 m)  Wt 167 lb 12.8 oz (76.114 kg)  BMI 33.87 kg/m2 , BMI Body mass index is 33.87 kg/(m^2). GEN: Well nourished, well developed, in no acute distress HEENT: normal Neck: no JVD, carotid bruits, or masses Cardiac: RRR;  no murmurs, rubs, or gallops,no edema  Respiratory:  clear to auscultation bilaterally, normal work of breathing GI: soft, nontender, nondistended, + BS MS: no deformity or atrophy Skin: warm and dry, no rash Neuro:  Strength and sensation are intact Psych: euthymic mood, full affect   EKG:  EKG  ordered today and showed NSR with nonspecific ST abnormality    Recent Labs: No results found for requested labs within last 365 days.    Lipid Panel No results found for: CHOL, TRIG, HDL, CHOLHDL, VLDL, LDLCALC, LDLDIRECT    Wt Readings from Last 3 Encounters:  03/22/15 167 lb 12.8 oz (76.114 kg)  02/02/15 168 lb (76.204 kg)  04/15/14 169 lb (76.658 kg)    ASSESSMENT AND PLAN: 1.  Inappropriate sinus tachycardia resolved and now off beta blockers due to fatigue.  She had one episode of tachycardia  since then and her BP was 182/145mmHg but she did not check her pulse.  She has not had any other problems except for that episodes and denies any other palpitations.  I wonder whether her tachycardia in the past was from chronic pain from her fibromyalgia because this has resolved after pain resolved on gabapentin.   2.  HTN well controlled - continue HCTZ and Losartan    Current medicines are reviewed at length with the patient today.  The patient does not have concerns regarding medicines.  The following changes have been made:  no change  Labs/ tests ordered today include: see above assessment and plan  Orders Placed This Encounter  Procedures  . EKG 12-Lead     Disposition:   FU with me in 1 year   Signed, Sueanne Margarita, MD  03/22/2015 3:03 PM    [facility] [address], [place], [place]  [address] Phone: [phone]; Fax: [phone]

## 2015-03-30 NOTE — Progress Notes (Signed)
Please put orders in Epic surgery 04-13-15 pre op 04-01-15 Thanks

## 2015-03-31 ENCOUNTER — Ambulatory Visit: Payer: Self-pay | Admitting: Orthopedic Surgery

## 2015-03-31 ENCOUNTER — Other Ambulatory Visit (HOSPITAL_COMMUNITY): Payer: Self-pay | Admitting: *Deleted

## 2015-03-31 NOTE — Progress Notes (Signed)
Preoperative surgical orders have been place into the Epic hospital system for Audrey [name] on 03/31/2015, 10:10 PM  by Mickel Crow for surgery on 04/13/15.  Preop Knee Scope orders including IV Tylenol and IV Decadron as long as there are no contraindications to the above medications. Arlee Muslim, PA-C

## 2015-04-01 ENCOUNTER — Encounter (HOSPITAL_COMMUNITY)
Admission: RE | Admit: 2015-04-01 | Discharge: 2015-04-01 | Disposition: A | Discharge status: Home / Self Care | Payer: PPO | Source: Ambulatory Visit | Attending: Orthopedic Surgery | Admitting: Orthopedic Surgery

## 2015-04-01 ENCOUNTER — Encounter: Payer: Self-pay | Admitting: Cardiology

## 2015-04-01 ENCOUNTER — Encounter (HOSPITAL_COMMUNITY): Payer: Self-pay

## 2015-04-01 DIAGNOSIS — S83206A Unspecified tear of unspecified meniscus, current injury, right knee, initial encounter: Secondary | ICD-10-CM | POA: Diagnosis not present

## 2015-04-01 DIAGNOSIS — X58XXXA Exposure to other specified factors, initial encounter: Secondary | ICD-10-CM | POA: Diagnosis not present

## 2015-04-01 HISTORY — DX: Cystitis, unspecified without hematuria: N30.90

## 2015-04-01 HISTORY — DX: Unspecified osteoarthritis, unspecified site: M19.90

## 2015-04-01 HISTORY — DX: Other complications of anesthesia, initial encounter: T88.59XA

## 2015-04-01 HISTORY — DX: Candidiasis, unspecified: B37.9

## 2015-04-01 HISTORY — DX: Adverse effect of unspecified anesthetic, initial encounter: T41.45XA

## 2015-04-01 HISTORY — DX: Other specified postprocedural states: Z98.890

## 2015-04-01 HISTORY — DX: Gastro-esophageal reflux disease without esophagitis: K21.9

## 2015-04-01 HISTORY — DX: Nausea with vomiting, unspecified: R11.2

## 2015-04-01 NOTE — Progress Notes (Signed)
CBCD,CMP,lipid panel and TSH results from 03/24/2015 per chart  EKG per epic 03/22/2015 ECHO per epic 04/16/2012 Stress test per epic 04/16/2012  LOV note per Fransico Him MD / cardiology/epic 03/22/2015 LOV note per epic 02/02/2015 Dr Jannifer Franklin / neurologist

## 2015-04-01 NOTE — Patient Instructions (Addendum)
Melissa  04/01/2015   Your procedure is scheduled on:      Wednesday Apr 13, 2015  Report to [facility] Main Entrance and follow signs to  [facility] arrive at 12:45 PM.  Call this number if you have problems the morning of surgery [phone] or Presurgical Testing [phone].   Remember:  Do not eat food After Midnight but may take clear liquids till 9:00 am day of surgery then nothing by mouth.   For Living Will and/or Health Care Power Attorney Forms: please provide copy for your medical record, may bring AM of surgery (forms should be already notarized-we do not provide this service).     Take these medicines the morning of surgery with A SIP OF WATER: Nexium; Gabapentin; Loratadine (Claritin) if needed;                               You may not have any metal on your body including hair pins and piercings  Do not wear jewelry, make-up, lotions, powders, or deodorant.  Do not shave body hair  48 hours(2 days) of CHG soap use.                Do not bring valuables to the hospital. [place].  Contacts, dentures or bridgework may not be worn into surgery.     Patients discharged the day of surgery will not be allowed to drive home.  Name and phone number of your driver:Joseph Mccarter (husband)   Special Instructions: review fact sheets for Incentive Spirometry. ________________________________________________________________________  [facility] - Preparing for Surgery Before surgery, you can play an important role.  Because skin is not sterile, your skin needs to be as free of germs as possible.  You can reduce the number of germs on your skin by washing with CHG (chlorahexidine gluconate) soap before surgery.  CHG is an antiseptic cleaner which kills germs and bonds with the skin to continue killing germs even after washing. Please DO NOT use if you have an allergy to CHG or antibacterial soaps.  If your skin becomes  reddened/irritated stop using the CHG and inform your nurse when you arrive at Short Stay. Do not shave (including legs and underarms) for at least 48 hours prior to the first CHG shower.  You may shave your face/neck. Please follow these instructions carefully:  1.  Shower with CHG Soap the night before surgery and the  morning of Surgery.  2.  If you choose to wash your hair, wash your hair first as usual with your  normal  shampoo.  3.  After you shampoo, rinse your hair and body thoroughly to remove the  shampoo.                           4.  Use CHG as you would any other liquid soap.  You can apply chg directly  to the skin and wash                       Gently with a scrungie or clean washcloth.  5.  Apply the CHG Soap to your body ONLY FROM THE NECK DOWN.   Do not use on face/ open  Wound or open sores. Avoid contact with eyes, ears mouth and genitals (private parts).                       Wash face,  Genitals (private parts) with your normal soap.             6.  Wash thoroughly, paying special attention to the area where your surgery  will be performed.  7.  Thoroughly rinse your body with warm water from the neck down.  8.  DO NOT shower/wash with your normal soap after using and rinsing off  the CHG Soap.                9.  Pat yourself dry with a clean towel.            10.  Wear clean pajamas.            11.  Place clean sheets on your bed the night of your first shower and do not  sleep with pets. Day of Surgery : Do not apply any lotions/deodorants the morning of surgery.  Please wear clean clothes to the hospital/surgery center.  FAILURE TO FOLLOW THESE INSTRUCTIONS MAY RESULT IN THE CANCELLATION OF YOUR SURGERY PATIENT SIGNATURE_________________________________  NURSE SIGNATURE__________________________________  ________________________________________________________________________    CLEAR LIQUID DIET   Foods Allowed                                                                      Foods Excluded  Coffee and tea, regular and decaf                             liquids that you cannot  Plain Jell-O in any flavor                                             see through such as: Fruit ices (not with fruit pulp)                                     milk, soups, orange juice  Iced Popsicles                                    All solid food Carbonated beverages, regular and diet                                    Cranberry, grape and apple juices Sports drinks like Gatorade Lightly seasoned clear broth or consume(fat free) Sugar, honey syrup  Sample Menu Breakfast                                Lunch  Supper Cranberry juice                    Beef broth                            Chicken broth Jell-O                                     Grape juice                           Apple juice Coffee or tea                        Jell-O                                      Popsicle                                                Coffee or tea                        Coffee or tea  _____________________________________________________________________    Incentive Spirometer  An incentive spirometer is a tool that can help keep your lungs clear and active. This tool measures how well you are filling your lungs with each breath. Taking long deep breaths may help reverse or decrease the chance of developing breathing (pulmonary) problems (especially infection) following:  A long period of time when you are unable to move or be active. BEFORE THE PROCEDURE   If the spirometer includes an indicator to show your best effort, your nurse or respiratory therapist will set it to a desired goal.  If possible, sit up straight or lean slightly forward. Try not to slouch.  Hold the incentive spirometer in an upright position. INSTRUCTIONS FOR USE  1. Sit on the edge of your bed if possible, or sit up as far as you  can in bed or on a chair. 2. Hold the incentive spirometer in an upright position. 3. Breathe out normally. 4. Place the mouthpiece in your mouth and seal your lips tightly around it. 5. Breathe in slowly and as deeply as possible, raising the piston or the ball toward the top of the column. 6. Hold your breath for 3-5 seconds or for as long as possible. Allow the piston or ball to fall to the bottom of the column. 7. Remove the mouthpiece from your mouth and breathe out normally. 8. Rest for a few seconds and repeat Steps 1 through 7 at least 10 times every 1-2 hours when you are awake. Take your time and take a few normal breaths between deep breaths. 9. The spirometer may include an indicator to show your best effort. Use the indicator as a goal to work toward during each repetition. 10. After each set of 10 deep breaths, practice coughing to be sure your lungs are clear. If you have an incision (the cut made at the time of surgery), support your incision when coughing by placing a pillow or rolled up towels firmly against  it. Once you are able to get out of bed, walk around indoors and cough well. You may stop using the incentive spirometer when instructed by your caregiver.  RISKS AND COMPLICATIONS  Take your time so you do not get dizzy or light-headed.  If you are in pain, you may need to take or ask for pain medication before doing incentive spirometry. It is harder to take a deep breath if you are having pain. AFTER USE  Rest and breathe slowly and easily.  It can be helpful to keep track of a log of your progress. Your caregiver can provide you with a simple table to help with this. If you are using the spirometer at home, follow these instructions: Stem IF:   You are having difficultly using the spirometer.  You have trouble using the spirometer as often as instructed.  Your pain medication is not giving enough relief while using the spirometer.  You develop  fever of 100.5 F (38.1 C) or higher. SEEK IMMEDIATE MEDICAL CARE IF:   You cough up bloody sputum that had not been present before.  You develop fever of 102 F (38.9 C) or greater.  You develop worsening pain at or near the incision site. MAKE SURE YOU:   Understand these instructions.  Will watch your condition.  Will get help right away if you are not doing well or get worse. Document Released: 03/25/2007 Document Revised: 02/04/2012 Document Reviewed: 05/26/2007 ExitCare Patient Information 2014 [place].   ________________________________________________________________________    CLEAR LIQUID DIET   Foods Allowed                                                                     Foods Excluded  Coffee and tea, regular and decaf                             liquids that you cannot  Plain Jell-O in any flavor                                             see through such as: Fruit ices (not with fruit pulp)                                     milk, soups, orange juice  Iced Popsicles                                    All solid food Carbonated beverages, regular and diet                                    Cranberry, grape and apple juices Sports drinks like Gatorade Lightly seasoned clear broth or consume(fat free) Sugar, honey syrup  Sample Menu Breakfast  Lunch                                     Supper Cranberry juice                    Beef broth                            Chicken broth Jell-O                                     Grape juice                           Apple juice Coffee or tea                        Jell-O                                      Popsicle                                                Coffee or tea                        Coffee or tea  _____________________________________________________________________

## 2015-04-12 DIAGNOSIS — S83249A Other tear of medial meniscus, current injury, unspecified knee, initial encounter: Secondary | ICD-10-CM | POA: Diagnosis present

## 2015-04-12 NOTE — Progress Notes (Signed)
Spoke with pt on 04/10/2014 in regards to time change pt is aware of surgery time change and knows to be at Short Stay at 12:50 pm. Also discussed clear liquid diet. Pt has info in regards to clear liquid diet and knows can take clear liquids till 9:20 am day of surgery then nothing by mouth.

## 2015-04-12 NOTE — H&P (Signed)
CC- Audrey [name] is a 74 y.o. female who presents with right knee pain.  HPI- . Knee Pain: Patient presents with knee pain involving the  right knee. Onset of the symptoms was several months ago. Inciting event: none known. Current symptoms include giving out, pain located medially and swelling. Pain is aggravated by kneeling, pivoting, rising after sitting and squatting.  Patient has had no prior knee problems. Evaluation to date: MRI: abnormal medial meniscal tear. Treatment to date: corticosteroid injection which was not very effective.  Past Medical History  Diagnosis Date  . 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 Obesity   . Reflux     buring and dysphagia sx resp to PPI; egd 3/06 neg for Barretts  . Hypertension   . Diastolic dysfunction   . Abnormality of gait 02/02/2015  . 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 Complication of anesthesia     difficulty awakening   . PONV (postoperative nausea and vomiting)   . Bladder infection     hx of last one approx 5 years ago   . Yeast infection     hx of   . GERD (gastroesophageal reflux disease)   . Arthritis   . Abnormality of gait     Past Surgical History  Procedure Laterality Date  . Colonoscopy  3/06    NEG  . Lumbar laminectomy    . Belpharoptosis repair Bilateral   . Eye surgery      cosmetic; eyelid surgery bilat   . Left rotator cuff repair      12/2006  . Finger surgery      left index finger/ cyst 02/2007  . Cortisone injections      right arm; back    Prior to Admission medications   Medication Sig Start Date End Date Taking? Authorizing Provider  aspirin 81 MG tablet Take 81 mg by mouth daily.   Yes Historical Provider, MD  Biotin 10 MG TABS Take 1 tablet by mouth daily.   Yes Historical Provider, MD  Calcium Carb-Cholecalciferol (CALCIUM + D3) 600-200 MG-UNIT TABS Take 1 tablet by mouth daily.    Yes Historical Provider, MD   Cholecalciferol (VITAMIN D3) 2000 UNITS CHEW Chew 1 tablet by mouth daily.    Yes Historical Provider, MD  Co-Enzyme Q10 200 MG CAPS Take 1 capsule by mouth daily.    Yes Historical Provider, MD  Cranberry 200 MG CAPS Take 1 capsule by mouth daily.    Yes Historical Provider, MD  Cyanocobalamin (VITAMIN B-12) 5000 MCG SUBL Place 1 tablet under the tongue daily.   Yes Historical Provider, MD  Esomeprazole Magnesium (NEXIUM PO) Take 1 capsule by mouth daily.   Yes Historical Provider, MD  gabapentin (NEURONTIN) 100 MG capsule One capsule 3 times a day for 2 weeks, then take 2 capsules 3 times a day Patient taking differently: Take 200 mg by mouth 3 (three) times daily. One capsule 3 times a day for 2 weeks, then take 2 capsules 3 times a day 02/10/15  Yes Kathrynn Ducking, MD  hydrochlorothiazide (MICROZIDE) 12.5 MG capsule Take 1 capsule (12.5 mg total) by mouth daily. 08/26/14  Yes Sueanne Margarita, MD  loratadine (CLARITIN) 10 MG tablet Take 10 mg by mouth daily.   Yes Historical Provider, MD  losartan (COZAAR) 50 MG tablet Take 50 mg by mouth every morning.    Yes Historical Provider, MD  Multiple Vitamins-Minerals (PRESERVISION AREDS  2) CAPS Take 1 tablet by mouth 2 (two) times daily.    Yes Historical Provider, MD  PREMARIN vaginal cream Place 1 Applicatorful vaginally 2 (two) times a week.  01/03/15  Yes Historical Provider, MD  rosuvastatin (CRESTOR) 10 MG tablet Take 10 mg by mouth 2 (two) times a week.   Yes Historical Provider, MD   KNEE EXAM antalgic gait, soft tissue tenderness over medial joint line, effusion, negative drawer sign, collateral ligaments intact  Physical Examination: General appearance - alert, well appearing, and in no distress Mental status - alert, oriented to person, place, and time Chest - clear to auscultation, no wheezes, rales or rhonchi, symmetric air entry Heart - normal rate, regular rhythm, normal S1, S2, no murmurs, rubs, clicks or gallops Abdomen - soft,  nontender, nondistended, no masses or organomegaly Neurological - alert, oriented, normal speech, no focal findings or movement disorder noted    Asessment/Plan--- Left knee medial meniscal tear- - Plan left knee arthroscopy with meniscal debridement. Procedure risks and potential comps discussed with patient who elects to proceed. Goals are decreased pain and increased function with a high likelihood of achieving both

## 2015-04-13 ENCOUNTER — Encounter (HOSPITAL_COMMUNITY): Payer: Self-pay | Admitting: *Deleted

## 2015-04-13 ENCOUNTER — Ambulatory Visit (HOSPITAL_COMMUNITY): Payer: PPO | Admitting: Anesthesiology

## 2015-04-13 ENCOUNTER — Encounter (HOSPITAL_COMMUNITY)
Admission: RE | Disposition: A | Discharge status: Home / Self Care | Payer: Self-pay | Source: Ambulatory Visit | Attending: Orthopedic Surgery

## 2015-04-13 ENCOUNTER — Ambulatory Visit (HOSPITAL_COMMUNITY)
Admission: RE | Admit: 2015-04-13 | Discharge: 2015-04-13 | Disposition: A | Discharge status: Home / Self Care | Payer: PPO | Source: Ambulatory Visit | Attending: Orthopedic Surgery | Admitting: Orthopedic Surgery

## 2015-04-13 DIAGNOSIS — I10 Essential (primary) hypertension: Secondary | ICD-10-CM | POA: Insufficient documentation

## 2015-04-13 DIAGNOSIS — Y999 Unspecified external cause status: Secondary | ICD-10-CM | POA: Insufficient documentation

## 2015-04-13 DIAGNOSIS — G609 Hereditary and idiopathic neuropathy, unspecified: Secondary | ICD-10-CM | POA: Insufficient documentation

## 2015-04-13 DIAGNOSIS — X58XXXA Exposure to other specified factors, initial encounter: Secondary | ICD-10-CM | POA: Insufficient documentation

## 2015-04-13 DIAGNOSIS — Y929 Unspecified place or not applicable: Secondary | ICD-10-CM | POA: Insufficient documentation

## 2015-04-13 DIAGNOSIS — Z79899 Other long term (current) drug therapy: Secondary | ICD-10-CM | POA: Diagnosis not present

## 2015-04-13 DIAGNOSIS — Z6833 Body mass index (BMI) 33.0-33.9, adult: Secondary | ICD-10-CM | POA: Diagnosis not present

## 2015-04-13 DIAGNOSIS — M94261 Chondromalacia, right knee: Secondary | ICD-10-CM | POA: Insufficient documentation

## 2015-04-13 DIAGNOSIS — S83241A Other tear of medial meniscus, current injury, right knee, initial encounter: Secondary | ICD-10-CM | POA: Insufficient documentation

## 2015-04-13 DIAGNOSIS — K219 Gastro-esophageal reflux disease without esophagitis: Secondary | ICD-10-CM | POA: Insufficient documentation

## 2015-04-13 DIAGNOSIS — E669 Obesity, unspecified: Secondary | ICD-10-CM | POA: Diagnosis not present

## 2015-04-13 DIAGNOSIS — M25561 Pain in right knee: Secondary | ICD-10-CM | POA: Diagnosis present

## 2015-04-13 DIAGNOSIS — Z7982 Long term (current) use of aspirin: Secondary | ICD-10-CM | POA: Diagnosis not present

## 2015-04-13 DIAGNOSIS — M797 Fibromyalgia: Secondary | ICD-10-CM | POA: Insufficient documentation

## 2015-04-13 DIAGNOSIS — M1990 Unspecified osteoarthritis, unspecified site: Secondary | ICD-10-CM | POA: Insufficient documentation

## 2015-04-13 DIAGNOSIS — S83249A Other tear of medial meniscus, current injury, unspecified knee, initial encounter: Secondary | ICD-10-CM | POA: Diagnosis present

## 2015-04-13 DIAGNOSIS — Y939 Activity, unspecified: Secondary | ICD-10-CM | POA: Insufficient documentation

## 2015-04-13 HISTORY — PX: KNEE ARTHROSCOPY: SHX127

## 2015-04-13 SURGERY — ARTHROSCOPY, KNEE
Anesthesia: General | Site: Knee | Laterality: Right

## 2015-04-13 MED ORDER — GABAPENTIN 100 MG PO CAPS: 200.0000 mg | ORAL_CAPSULE | Freq: Three times a day (TID) | ORAL | Status: DC

## 2015-04-13 MED ORDER — PROPOFOL 10 MG/ML IV BOLUS
INTRAVENOUS | Status: AC
  Filled 2015-04-13: qty 20

## 2015-04-13 MED ORDER — LIDOCAINE HCL (CARDIAC) 20 MG/ML IV SOLN
INTRAVENOUS | Status: AC
  Filled 2015-04-13: qty 5

## 2015-04-13 MED ORDER — FENTANYL CITRATE (PF) 250 MCG/5ML IJ SOLN
INTRAMUSCULAR | Status: AC
  Filled 2015-04-13: qty 5

## 2015-04-13 MED ORDER — SODIUM CHLORIDE 0.9 % IV SOLN: INTRAVENOUS | Status: DC

## 2015-04-13 MED ORDER — DEXAMETHASONE SODIUM PHOSPHATE 10 MG/ML IJ SOLN: 10.0000 mg | Freq: Once | INTRAMUSCULAR | Status: DC

## 2015-04-13 MED ORDER — ONDANSETRON HCL 4 MG/2ML IJ SOLN
INTRAMUSCULAR | Status: AC
  Filled 2015-04-13: qty 2

## 2015-04-13 MED ORDER — FENTANYL CITRATE (PF) 100 MCG/2ML IJ SOLN
INTRAMUSCULAR | Status: DC | PRN
  Administered 2015-04-13 (×3): 50 ug via INTRAVENOUS

## 2015-04-13 MED ORDER — ACETAMINOPHEN 10 MG/ML IV SOLN
1000.0000 mg | Freq: Once | INTRAVENOUS | Status: AC
  Administered 2015-04-13: 1000 mg via INTRAVENOUS
  Filled 2015-04-13: qty 100

## 2015-04-13 MED ORDER — FENTANYL CITRATE (PF) 100 MCG/2ML IJ SOLN: 25.0000 ug | INTRAMUSCULAR | Status: DC | PRN

## 2015-04-13 MED ORDER — DEXAMETHASONE SODIUM PHOSPHATE 10 MG/ML IJ SOLN
INTRAMUSCULAR | Status: AC
  Filled 2015-04-13: qty 1

## 2015-04-13 MED ORDER — METHOCARBAMOL 500 MG PO TABS: 500.0000 mg | ORAL_TABLET | Freq: Four times a day (QID) | ORAL | Status: DC

## 2015-04-13 MED ORDER — MIDAZOLAM HCL 5 MG/5ML IJ SOLN
INTRAMUSCULAR | Status: DC | PRN
  Administered 2015-04-13 (×2): 1 mg via INTRAVENOUS

## 2015-04-13 MED ORDER — LACTATED RINGERS IR SOLN
Status: DC | PRN
  Administered 2015-04-13: 6000 mL

## 2015-04-13 MED ORDER — MIDAZOLAM HCL 2 MG/2ML IJ SOLN
INTRAMUSCULAR | Status: AC
  Filled 2015-04-13: qty 2

## 2015-04-13 MED ORDER — ONDANSETRON HCL 4 MG/2ML IJ SOLN
INTRAMUSCULAR | Status: DC | PRN
  Administered 2015-04-13: 4 mg via INTRAVENOUS

## 2015-04-13 MED ORDER — BUPIVACAINE-EPINEPHRINE 0.25% -1:200000 IJ SOLN
INTRAMUSCULAR | Status: DC | PRN
  Administered 2015-04-13: 20 mL

## 2015-04-13 MED ORDER — DEXAMETHASONE SODIUM PHOSPHATE 10 MG/ML IJ SOLN
INTRAMUSCULAR | Status: DC | PRN
  Administered 2015-04-13: 10 mg via INTRAVENOUS

## 2015-04-13 MED ORDER — BUPIVACAINE-EPINEPHRINE (PF) 0.25% -1:200000 IJ SOLN
INTRAMUSCULAR | Status: AC
  Filled 2015-04-13: qty 30

## 2015-04-13 MED ORDER — CHLORHEXIDINE GLUCONATE 4 % EX LIQD: 60.0000 mL | Freq: Once | CUTANEOUS | Status: DC

## 2015-04-13 MED ORDER — PROMETHAZINE HCL 25 MG/ML IJ SOLN: 6.2500 mg | INTRAMUSCULAR | Status: DC | PRN

## 2015-04-13 MED ORDER — LIDOCAINE HCL (CARDIAC) 20 MG/ML IV SOLN
INTRAVENOUS | Status: DC | PRN
  Administered 2015-04-13: 75 mg via INTRAVENOUS

## 2015-04-13 MED ORDER — HYDROCODONE-ACETAMINOPHEN 5-325 MG PO TABS: 1.0000 | ORAL_TABLET | Freq: Four times a day (QID) | ORAL | Status: DC | PRN

## 2015-04-13 MED ORDER — CEFAZOLIN SODIUM-DEXTROSE 2-3 GM-% IV SOLR
INTRAVENOUS | Status: AC
  Filled 2015-04-13: qty 50

## 2015-04-13 MED ORDER — PROPOFOL 10 MG/ML IV BOLUS
INTRAVENOUS | Status: DC | PRN
  Administered 2015-04-13: 175 mg via INTRAVENOUS

## 2015-04-13 MED ORDER — MEPERIDINE HCL 50 MG/ML IJ SOLN: 6.2500 mg | INTRAMUSCULAR | Status: DC | PRN

## 2015-04-13 MED ORDER — LACTATED RINGERS IV SOLN
INTRAVENOUS | Status: DC
  Administered 2015-04-13: 1000 mL via INTRAVENOUS

## 2015-04-13 MED ORDER — CEFAZOLIN SODIUM-DEXTROSE 2-3 GM-% IV SOLR
2.0000 g | INTRAVENOUS | Status: AC
  Administered 2015-04-13: 2 g via INTRAVENOUS

## 2015-04-13 SURGICAL SUPPLY — 28 items
BANDAGE ELASTIC 6 VELCRO ST LF (GAUZE/BANDAGES/DRESSINGS) ×2 IMPLANT
BLADE 4.2CUDA (BLADE) ×2 IMPLANT
COVER SURGICAL LIGHT HANDLE (MISCELLANEOUS) ×2 IMPLANT
CUFF TOURN SGL QUICK 34 (TOURNIQUET CUFF) ×1
CUFF TRNQT CYL 34X4X40X1 (TOURNIQUET CUFF) ×1 IMPLANT
DRAPE U-SHAPE 47X51 STRL (DRAPES) ×2 IMPLANT
DRSG EMULSION OIL 3X3 NADH (GAUZE/BANDAGES/DRESSINGS) ×2 IMPLANT
DRSG PAD ABDOMINAL 8X10 ST (GAUZE/BANDAGES/DRESSINGS) ×4 IMPLANT
DURAPREP 26ML APPLICATOR (WOUND CARE) ×2 IMPLANT
GAUZE SPONGE 4X4 12PLY STRL (GAUZE/BANDAGES/DRESSINGS) ×2 IMPLANT
GAUZE SPONGE 4X4 16PLY XRAY LF (GAUZE/BANDAGES/DRESSINGS) ×2 IMPLANT
GLOVE BIO SURGEON STRL SZ8 (GLOVE) ×2 IMPLANT
GLOVE BIOGEL PI IND STRL 8 (GLOVE) ×1 IMPLANT
GLOVE BIOGEL PI INDICATOR 8 (GLOVE) ×1
GOWN STRL REUS W/TWL LRG LVL3 (GOWN DISPOSABLE) ×2 IMPLANT
KIT BASIN OR (CUSTOM PROCEDURE TRAY) ×2 IMPLANT
MANIFOLD NEPTUNE II (INSTRUMENTS) ×2 IMPLANT
MARKER PEN SURG W/LABELS BLK (STERILIZATION PRODUCTS) ×2 IMPLANT
PACK ARTHROSCOPY WL (CUSTOM PROCEDURE TRAY) ×2 IMPLANT
PACK ICE MAXI GEL EZY WRAP (MISCELLANEOUS) ×6 IMPLANT
PADDING CAST COTTON 6X4 STRL (CAST SUPPLIES) ×2 IMPLANT
POSITIONER SURGICAL ARM (MISCELLANEOUS) ×2 IMPLANT
SET ARTHROSCOPY TUBING (MISCELLANEOUS) ×1
SET ARTHROSCOPY TUBING LN (MISCELLANEOUS) ×1 IMPLANT
SUT ETHILON 4 0 PS 2 18 (SUTURE) ×2 IMPLANT
TOWEL OR 17X26 10 PK STRL BLUE (TOWEL DISPOSABLE) ×2 IMPLANT
WAND 90 DEG TURBOVAC W/CORD (SURGICAL WAND) ×2 IMPLANT
WRAP KNEE MAXI GEL POST OP (GAUZE/BANDAGES/DRESSINGS) ×2 IMPLANT

## 2015-04-13 NOTE — Discharge Instructions (Signed)
° °Dr. Ethyl Vila °Total Joint Specialist °[facility] °[address] °[place], [place] [address] °[phone] ° ° °Arthroscopic Procedure, Knee °An arthroscopic procedure can find what is wrong with your knee. °PROCEDURE °Arthroscopy is a surgical technique that allows your orthopedic surgeon to diagnose and treat your knee injury with accuracy. They will look into your knee through a small instrument. This is almost like a small (pencil sized) telescope. Because arthroscopy affects your knee less than open knee surgery, you can anticipate a more rapid recovery. Taking an active role by following your caregiver's instructions will help with rapid and complete recovery. Use crutches, rest, elevation, ice, and knee exercises as instructed. The length of recovery depends on various factors including type of injury, age, physical condition, medical conditions, and your rehabilitation. °Your knee is the joint between the large bones (femur and tibia) in your leg. Cartilage covers these bone ends which are smooth and slippery and allow your knee to bend and move smoothly. Two menisci, thick, semi-lunar shaped pads of cartilage which form a rim inside the joint, help absorb shock and stabilize your knee. Ligaments bind the bones together and support your knee joint. Muscles move the joint, help support your knee, and take stress off the joint itself. Because of this all programs and physical therapy to rehabilitate an injured or repaired knee require rebuilding and strengthening your muscles. °AFTER THE PROCEDURE °· After the procedure, you will be moved to a recovery area until most of the effects of the medication have worn off. Your caregiver will discuss the test results with you.  °· Only take over-the-counter or prescription medicines for pain, discomfort, or fever as directed by your caregiver.  °SEEK MEDICAL CARE IF:  °· You have increased bleeding from your wounds.  °· You see  redness, swelling, or have increasing pain in your wounds.  °· You have pus coming from your wound.  °· You have an oral temperature above 102° F (38.9° C).  °· You notice a bad smell coming from the wound or dressing.  °· You have severe pain with any motion of your knee.  °SEEK IMMEDIATE MEDICAL CARE IF:  °· You develop a rash.  °· You have difficulty breathing.  °· You have any allergic problems.  °FURTHER INSTRUCTIONS:  °· ICE to the affected knee every three hours for 30 minutes at a time and then as needed for pain and swelling.  Continue to use ice on the knee for pain and swelling from surgery. You may notice swelling that will progress down to the foot and ankle.  This is normal after surgery.  Elevate the leg when you are not up walking on it.   ° °DIET °You may resume your previous home diet once your are discharged from the hospital. ° °DRESSING / WOUND CARE / SHOWERING °You may start showering two days after being discharged home but do not submerge the incisions under water.  °Change dressing 48 hours after the procedure and then cover the small incisions with band aids until your follow up visit. °Change the surgical dressings daily and reapply a dry dressing each time.  ° °ACTIVITY °Walk with your walker as instructed. °Use walker as long as suggested by your caregivers. °Avoid periods of inactivity such as sitting longer than an hour when not asleep. This helps prevent blood clots.  °You may resume a sexual relationship in one month or when given the OK by your doctor.  °You may return to   work once you are cleared by your doctor.  Do not drive a car for 6 weeks or until released by you surgeon.  Do not drive while taking narcotics.  WEIGHT BEARING You may bear weight as tolerated. Use walker for comfort and safety until you feel steady without it  POSTOPERATIVE CONSTIPATION PROTOCOL Constipation - defined medically as fewer than three stools per week and severe constipation as less than one  stool per week.  One of the most common issues patients have following surgery is constipation.  Even if you have a regular bowel pattern at home, your normal regimen is likely to be disrupted due to multiple reasons following surgery.  Combination of anesthesia, postoperative narcotics, change in appetite and fluid intake all can affect your bowels.  In order to avoid complications following surgery, here are some recommendations in order to help you during your recovery period.  Colace (docusate) - Pick up an over-the-counter form of Colace or another stool softener and take twice a day as long as you are requiring postoperative pain medications.  Take with a full glass of water daily.  If you experience loose stools or diarrhea, hold the colace until you stool forms back up.  If your symptoms do not get better within 1 week or if they get worse, check with your doctor.  Dulcolax (bisacodyl) - Pick up over-the-counter and take as directed by the product packaging as needed to assist with the movement of your bowels.  Take with a full glass of water.  Use this product as needed if not relieved by Colace only.   MiraLax (polyethylene glycol) - Pick up over-the-counter to have on hand.  MiraLax is a solution that will increase the amount of water in your bowels to assist with bowel movements.  Take as directed and can mix with a glass of water, juice, soda, coffee, or tea.  Take if you go more than two days without a movement. Do not use MiraLax more than once per day. Call your doctor if you are still constipated or irregular after using this medication for 7 days in a row.  If you continue to have problems with postoperative constipation, please contact the office for further assistance and recommendations.  If you experience "the worst abdominal pain ever" or develop nausea or vomiting, please contact the office immediatly for further recommendations for treatment.  ITCHING  If you experience itching  with your medications, try taking only a single pain pill, or even half a pain pill at a time.  You can also use Benadryl over the counter for itching or also to help with sleep.   TED HOSE STOCKINGS Wear the elastic stockings on both legs for three weeks following surgery during the day but you may remove then at night for sleeping.  MEDICATIONS See your medication summary on the After Visit Summary that the nursing staff will review with you prior to discharge.  You may have some home medications which will be placed on hold until you complete the course of blood thinner medication.  It is important for you to complete the blood thinner medication as prescribed by your surgeon.  Continue your approved medications as instructed at time of discharge. Do not drive while taking narcotics.   PRECAUTIONS If you experience chest pain or shortness of breath - call 911 immediately for transfer to the hospital emergency department.  If you develop a fever greater that 101 F, purulent drainage from wound, increased redness or drainage from  wound, foul odor from the wound/dressing, or calf pain - CONTACT YOUR SURGEON.                                                   FOLLOW-UP APPOINTMENTS Make sure you keep all of your appointments after your operation with your surgeon and caregivers. You should call the office at [phone]  and make an appointment for approximately one week after the date of your surgery or on the date instructed by your surgeon outlined in the "After Visit Summary".  RANGE OF MOTION AND STRENGTHENING EXERCISES  Rehabilitation of the knee is important following a knee injury or an operation. After just a few days of immobilization, the muscles of the thigh which control the knee become weakened and shrink (atrophy). Knee exercises are designed to build up the tone and strength of the thigh muscles and to improve knee motion. Often times heat used for twenty to thirty minutes before  working out will loosen up your tissues and help with improving the range of motion but do not use heat for the first two weeks following surgery. These exercises can be done on a training (exercise) mat, on the floor, on a table or on a bed. Use what ever works the best and is most comfortable for you Knee exercises include:  QUAD STRENGTHENING EXERCISES Strengthening Quadriceps Sets  Tighten muscles on top of thigh by pushing knees down into floor or table. Hold for 20 seconds. Repeat 10 times. Do 2 sessions per day.     Strengthening Terminal Knee Extension  With knee bent over bolster, straighten knee by tightening muscle on top of thigh. Be sure to keep bottom of knee on bolster. Hold for 20 seconds. Repeat 10 times. Do 2 sessions per day.   Straight Leg with Bent Knee  Lie on back with opposite leg bent. Keep involved knee slightly bent at knee and raise leg 4-6". Hold for 10 seconds. Repeat 20 times per set. Do 2 sets per session. Do 2 sessions per day.

## 2015-04-13 NOTE — Anesthesia Postprocedure Evaluation (Signed)
  Anesthesia Post-op Note  Patient: Audrey [name]  Procedure(s) Performed: Procedure(s) (LRB): ARTHROSCOPY RIGHT KNEE WITH MEDIAL MENISCAL DEBRIDEMENT AND CHONDROPLASTY (Right)  Patient Location: PACU  Anesthesia Type: General  Level of Consciousness: awake and alert   Airway and Oxygen Therapy: Patient Spontanous Breathing  Post-op Pain: mild  Post-op Assessment: Post-op Vital signs reviewed, Patient's Cardiovascular Status Stable, Respiratory Function Stable, Patent Airway and No signs of Nausea or vomiting  Last Vitals:  Filed Vitals:   04/13/15 1720  BP: 139/66  Pulse: 79  Temp:   Resp: 18    Post-op Vital Signs: stable   Complications: No apparent anesthesia complications

## 2015-04-13 NOTE — Interval H&P Note (Signed)
History and Physical Interval Note:  04/13/2015 3:44 PM  Audrey [name]  has presented today for surgery, with the diagnosis of right knee medial meniscal tear  The various methods of treatment have been discussed with the patient and family. After consideration of risks, benefits and other options for treatment, the patient has consented to  Procedure(s): ARTHROSCOPY RIGHT KNEE WITH DEBRIDEMENT (Right) as a surgical intervention .  The patient's history has been reviewed, patient examined, no change in status, stable for surgery.  I have reviewed the patient's chart and labs.  Questions were answered to the patient's satisfaction.     [name]

## 2015-04-13 NOTE — Anesthesia Preprocedure Evaluation (Addendum)
Anesthesia Evaluation  Patient identified by MRN, date of birth, ID band Patient awake    Reviewed: Allergy & Precautions, NPO status , Patient's Chart, lab work & pertinent test results  History of Anesthesia Complications (+) PONV and history of anesthetic complications  Airway Mallampati: II  TM Distance: >3 FB Neck ROM: Full    Dental no notable dental hx. (+) Caps   Pulmonary neg pulmonary ROS,  breath sounds clear to auscultation  Pulmonary exam normal       Cardiovascular hypertension, Pt. on medications Normal cardiovascular exam+ dysrhythmias Rhythm:Regular Rate:Normal     Neuro/Psych negative neurological ROS  negative psych ROS   GI/Hepatic Neg liver ROS, GERD-  Medicated and Controlled,  Endo/Other  negative endocrine ROS  Renal/GU negative Renal ROS  negative genitourinary   Musculoskeletal  (+) Arthritis -, Fibromyalgia -  Abdominal (+) + obese,   Peds negative pediatric ROS (+)  Hematology negative hematology ROS (+)   Anesthesia Other Findings   Reproductive/Obstetrics negative OB ROS                            Anesthesia Physical Anesthesia Plan  ASA: II  Anesthesia Plan: General   Post-op Pain Management:    Induction: Intravenous  Airway Management Planned: LMA  Additional Equipment:   Intra-op Plan:   Post-operative Plan: Extubation in OR  Informed Consent: I have reviewed the patients History and Physical, chart, labs and discussed the procedure including the risks, benefits and alternatives for the proposed anesthesia with the patient or authorized representative who has indicated his/her understanding and acceptance.   Dental advisory given  Plan Discussed with: CRNA  Anesthesia Plan Comments:        Anesthesia Quick Evaluation

## 2015-04-13 NOTE — Op Note (Signed)
Preoperative diagnosis-  Right knee medial meniscal tear  Postoperative diagnosis Right- knee medial meniscal tear plus  Medial and lateral femoral chondral defects  Procedure- Right knee arthroscopy with medial  meniscal debridement and chondroplasty   Surgeon- Dione Plover. Shylah Dossantos, MD  Anesthesia-General  EBL-  Minimal  Complications- None  Condition- PACU - hemodynamically stable.  Brief clinical note- -Audrey [name] is a 74 y.o.  female with a several month history of right knee pain and mechanical symptoms. Exam and history suggested medial meniscal tear confirmed by MRI. The patient presents now for arthroscopy and debridement  Procedure in detail -       After successful administration of General anesthetic, a tourmiquet is placed high on the Right  thigh and the Right lower extremity is prepped and draped in the usual sterile fashion. Time out is performed by the surgical team. Standard superomedial and inferolateral portal sites are marked and incisions made with an 11 blade. The inflow cannula is passed through the superomedial portal and camera through the inferolateral portal and inflow is initiated. Arthroscopic visualization proceeds.      The undersurface of the patella and trochlea are visualized and there is a large area of exposed bone on the undersurface of the patella. The trochlea has mild chondromalacia. The medial and lateral gutters are visualized and there are  no loose bodies. Flexion and valgus force is applied to the knee and the medial compartment is entered. A spinal needle is passed into the joint through the site marked for the inferomedial portal. A small incision is made and the dilator passed into the joint. The findings for the medial compartment are tear in body and posterior horn of medial meniscus with unstable cartilage on the surface of the medial femoral condyle . The tear is debrided to a stable base with baskets and a shaver and sealed off with the  Arthrocare. The shaver is used to debride the unstable cartilage to a stable cartilaginous base with stable edges. It is probed and found to be stable.    The intercondylar notch is visualized and the ACL appears normal. The lateral compartment is entered and the findings are 1 x 0.5 cm unstable chondral flap lateral femoral condyle with exposed bone at base . The shaver is used to debride the unstable cartilage to a stable bony base with stable edges. It is probed and found to be stable and the shaver is used to abrade the exposed bone.     The joint is again inspected and there are no other tears, defects or loose bodies identified. The arthroscopic equipment is then removed from the inferior portals which are closed with interrupted 4-0 nylon. 20 ml of .25% Marcaine with epinephrine are injected through the inflow cannula and the cannula is then removed and the portal closed with nylon. The incisions are cleaned and dried and a bulky sterile dressing is applied. The patient is then awakened and transported to recovery in stable condition.   04/13/2015, 4:36 PM

## 2015-04-13 NOTE — Anesthesia Procedure Notes (Signed)
Procedure Name: LMA Insertion Date/Time: 04/13/2015 3:58 PM Performed by: Ofilia Neas Pre-anesthesia Checklist: Patient identified, Timeout performed, Emergency Drugs available, Suction available and Patient being monitored Patient Re-evaluated:Patient Re-evaluated prior to inductionOxygen Delivery Method: Circle system utilized Preoxygenation: Pre-oxygenation with 100% oxygen Intubation Type: IV induction LMA: LMA inserted LMA Size: 4.0 Number of attempts: 1 Placement Confirmation: positive ETCO2 Tube secured with: Tape Dental Injury: Teeth and Oropharynx as per pre-operative assessment

## 2015-04-13 NOTE — Transfer of Care (Signed)
Immediate Anesthesia Transfer of Care Note  Patient: Audrey [name]  Procedure(s) Performed: Procedure(s): ARTHROSCOPY RIGHT KNEE WITH MEDIAL MENISCAL DEBRIDEMENT AND CHONDROPLASTY (Right)  Patient Location: PACU  Anesthesia Type:General  Level of Consciousness: awake, alert , oriented and patient cooperative  Airway & Oxygen Therapy: Patient Spontanous Breathing and Patient connected to face mask oxygen  Post-op Assessment: Report given to RN and Post -op Vital signs reviewed and stable  Post vital signs: Reviewed and stable  Last Vitals:  Filed Vitals:   04/13/15 1257  BP: 147/86  Pulse: 100  Temp: 36.4 C  Resp: 18    Complications: No apparent anesthesia complications

## 2015-04-14 ENCOUNTER — Encounter (HOSPITAL_COMMUNITY): Payer: Self-pay | Admitting: Orthopedic Surgery

## 2015-05-03 ENCOUNTER — Telehealth: Payer: Self-pay | Admitting: Neurology

## 2015-05-03 NOTE — Telephone Encounter (Signed)
Patient is calling in regard to Rx Gabapentin 600 mg.  She was instructed to take for 3 months and has no more refills.  She states it has made a wonderful difference in her life and that the current dosage is working great for her.  Does she need to come in for another appt or can another Rx be called in.  Please Call and advise.  Thanks!

## 2015-05-03 NOTE — Telephone Encounter (Signed)
I called the patient. There is an active order for Gabapentin from another provider. The patient stated it was sent to the wrong mail order pharmacy. I advised her to call her pharmacy and request the order be transferred to the correct pharmacy. I asked that she call back if she has any problems.

## 2015-05-05 ENCOUNTER — Other Ambulatory Visit: Payer: Self-pay | Admitting: Neurology

## 2015-06-02 ENCOUNTER — Ambulatory Visit (INDEPENDENT_AMBULATORY_CARE_PROVIDER_SITE_OTHER): Payer: PPO | Admitting: Neurology

## 2015-06-02 ENCOUNTER — Encounter: Payer: Self-pay | Admitting: Neurology

## 2015-06-02 VITALS — BP 148/87 | HR 79 | Ht 59.0 in | Wt 167.8 lb

## 2015-06-02 DIAGNOSIS — G609 Hereditary and idiopathic neuropathy, unspecified: Secondary | ICD-10-CM | POA: Diagnosis not present

## 2015-06-02 DIAGNOSIS — M797 Fibromyalgia: Secondary | ICD-10-CM

## 2015-06-02 DIAGNOSIS — R269 Unspecified abnormalities of gait and mobility: Secondary | ICD-10-CM | POA: Diagnosis not present

## 2015-06-02 MED ORDER — GABAPENTIN 300 MG PO CAPS: 300.0000 mg | ORAL_CAPSULE | Freq: Three times a day (TID) | ORAL | Status: DC

## 2015-06-02 NOTE — Patient Instructions (Signed)

## 2015-06-02 NOTE — Progress Notes (Signed)
Reason for visit: Peripheral neuropathy  Audrey [name] is an 74 y.o. female  History of present illness:  Audrey [name] is a 74 year old right-handed white female with a history of a peripheral neuropathy of moderate severity. The patient has had discomfort in the legs associated with this, and she was placed on low-dose gabapentin. The patient currently is on 200 mg 3 times daily with excellent benefit. She is still having some itching sensations in the legs at night, and some mild achy sensations in the daytime. The patient denies any side effects from the gabapentin, she has not had any significant gait instability or falls. She does engage in yoga. She does have some mild gait instability, however. The patient returns for an evaluation.  Past Medical History  Diagnosis Date  . 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 Obesity   . Reflux     buring and dysphagia sx resp to PPI; egd 3/06 neg for Barretts  . Hypertension   . Diastolic dysfunction   . Abnormality of gait 02/02/2015  . 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 Complication of anesthesia     difficulty awakening   . PONV (postoperative nausea and vomiting)   . Bladder infection     hx of last one approx 5 years ago   . Yeast infection     hx of   . GERD (gastroesophageal reflux disease)   . Arthritis   . Abnormality of gait     Past Surgical History  Procedure Laterality Date  . Colonoscopy  3/06    NEG  . Lumbar laminectomy    . Belpharoptosis repair Bilateral   . Eye surgery      cosmetic; eyelid surgery bilat   . Left rotator cuff repair      12/2006  . Finger surgery      left index finger/ cyst 02/2007  . Cortisone injections      right arm; back  . Knee arthroscopy Right 04/13/2015    Procedure: ARTHROSCOPY RIGHT KNEE WITH MEDIAL MENISCAL DEBRIDEMENT AND CHONDROPLASTY;  Surgeon: Gaynelle Arabian, MD;  Location: [facility];  Service:  Orthopedics;  Laterality: Right;    Family History  Problem Relation Age of Onset  . Cancer Mother     breast  . Cancer Father     Hodgkin's disease  . Down syndrome Sister   . Alcohol abuse Brother     Social history:  reports that she has never smoked. She has never used smokeless tobacco. She reports that she does not drink alcohol or use illicit drugs.    Allergies  Allergen Reactions  . Cymbalta [Duloxetine Hcl]     Diarrhea  . Macrodantin [Nitrofurantoin] Rash    Medications:  Prior to Admission medications   Medication Sig Start Date End Date Taking? Authorizing Provider  aspirin 81 MG tablet Take 81 mg by mouth daily.   Yes Historical Provider, MD  Biotin 10 MG TABS Take 1 tablet by mouth daily.   Yes Historical Provider, MD  Calcium Carb-Cholecalciferol (CALCIUM + D3) 600-200 MG-UNIT TABS Take 1 tablet by mouth daily.    Yes Historical Provider, MD  calcium carbonate (TUMS - DOSED IN MG ELEMENTAL CALCIUM) 500 MG chewable tablet Chew 1 tablet by mouth daily.   Yes Historical Provider, MD  cholecalciferol (VITAMIN D) 1000 UNITS tablet Take 2,000 Units by mouth daily.   Yes Historical Provider, MD  Co-Enzyme Q10  200 MG CAPS Take 1 capsule by mouth daily.    Yes Historical Provider, MD  Cranberry 200 MG CAPS Take 1 capsule by mouth daily.    Yes Historical Provider, MD  Cyanocobalamin (VITAMIN B-12) 5000 MCG SUBL Place 1 tablet under the tongue daily.   Yes Historical Provider, MD  Esomeprazole Magnesium (NEXIUM PO) Take 1 capsule by mouth daily.   Yes Historical Provider, MD  gabapentin (NEURONTIN) 100 MG capsule Take 2 capsules (200 mg total) by mouth 3 (three) times daily. One capsule 3 times a day for 2 weeks, then take 2 capsules 3 times a day 04/13/15  Yes Gaynelle Arabian, MD  hydrochlorothiazide (MICROZIDE) 12.5 MG capsule Take 1 capsule (12.5 mg total) by mouth daily. 08/26/14  Yes Sueanne Margarita, MD  HYDROcodone-acetaminophen (NORCO) 5-325 MG per tablet Take 1-2 tablets  by mouth every 6 (six) hours as needed for moderate pain. 04/13/15  Yes Gaynelle Arabian, MD  loratadine (CLARITIN) 10 MG tablet Take 10 mg by mouth daily.   Yes Historical Provider, MD  losartan (COZAAR) 50 MG tablet Take 50 mg by mouth every morning.    Yes Historical Provider, MD  Multiple Vitamins-Minerals (PRESERVISION AREDS 2) CAPS Take 1 tablet by mouth 2 (two) times daily.    Yes Historical Provider, MD  PREMARIN vaginal cream Place 1 Applicatorful vaginally 2 (two) times a week.  01/03/15  Yes Historical Provider, MD  rosuvastatin (CRESTOR) 10 MG tablet Take 10 mg by mouth 2 (two) times a week.   Yes Historical Provider, MD    ROS:  Out of a complete 14 system review of symptoms, the patient complains only of the following symptoms, and all other reviewed systems are negative.  Joint pain Numbness  Blood pressure 148/87, pulse 79, height 4\' 11"  (1.499 m), weight 167 lb 12.8 oz (76.114 kg).  Physical Exam  General: The patient is alert and cooperative at the time of the examination. The patient is moderately obese.  Skin: No significant peripheral edema is noted.   Neurologic Exam  Mental status: The patient is alert and oriented x 3 at the time of the examination. The patient has apparent normal recent and remote memory, with an apparently normal attention span and concentration ability.   Cranial nerves: Facial symmetry is present. Speech is normal, no aphasia or dysarthria is noted. Extraocular movements are full. Visual fields are full.  Motor: The patient has good strength in all 4 extremities.  Sensory examination: Soft touch sensation is symmetric on the face, arms, and legs.  Coordination: The patient has good finger-nose-finger and heel-to-shin bilaterally.  Gait and station: The patient has a normal gait. Tandem gait is minimally unsteady. Romberg is negative. No drift is seen.  Reflexes: Deep tendon reflexes are symmetric.   Assessment/Plan:  1. Peripheral  neuropathy  2. Fibromyalgia  The patient is doing quite well this time on the gabapentin. She will be increased on the dose taking 300 mg 3 times daily. A prescription was called in. She will follow-up in 6 months.  Jill Alexanders MD 06/02/2015 3:50 PM  [facility] [address] [place],  [address]  Phone [phone] Fax [phone]

## 2015-07-14 ENCOUNTER — Other Ambulatory Visit: Payer: Self-pay | Admitting: Gastroenterology

## 2015-09-07 ENCOUNTER — Telehealth: Payer: Self-pay | Admitting: Cardiology

## 2015-09-07 NOTE — Telephone Encounter (Signed)
Patient st she stopped Bystolic on her own last March because it made her tired. It was taken out of her medication list then. August 31, 2015, patient restarted Bystolic on her own. She st her BP and HR were elevated. BP was 122/100 and HR 155. (she st she does not think this is correct and she was asymptomatic at time of check) She has intermittently checked BP since, but does not know dates. 156/98   HR 81 152/101 HR 82 144/99   HR 82 And yesterday, her BP was 117/89 and HR 65. She is asymptomatic and has no complaints.  Patient is requesting refills of her Bystolic.  Informed the patient that Dr. Radford Pax has not seen her and was not the last provider to order the medication (patient st her PCP ordered it for her).  Patient st she sees her PCP early November. Instructed her to talk to her PCP about the medication and if her PCP will not refill, then to call back and I will schedule an earlier follow-up appointment with Dr. Radford Pax for evaluation.  Instructed patient to continue checking BP and HR and to call if it increases.

## 2015-09-07 NOTE — Telephone Encounter (Signed)
Agree 

## 2015-09-07 NOTE — Telephone Encounter (Signed)
Pt called requesting a refill on Bystolic 5 mg taken daily. I explained to the pt that the medication was not on her medication list and that I would have sent a message to Dr. Theodosia Blender nurse. The pt stated that she would try her PCP and if that didn't work, she would give our office a call back. I advised that pt that if she has any other problems, questions or concerns to give our office a call back. Pt verbalized understanding. FYI

## 2015-10-25 ENCOUNTER — Other Ambulatory Visit: Payer: Self-pay | Admitting: Radiology

## 2015-10-25 DIAGNOSIS — C50919 Malignant neoplasm of unspecified site of unspecified female breast: Secondary | ICD-10-CM

## 2015-10-25 HISTORY — DX: Malignant neoplasm of unspecified site of unspecified female breast: C50.919

## 2015-11-15 ENCOUNTER — Other Ambulatory Visit: Payer: Self-pay | Admitting: Surgery

## 2015-11-24 NOTE — Pre-Procedure Instructions (Signed)
    Audrey [name]  11/24/2015      [facility] - [place], [place] - [address] [place] [address] Phone: [phone] Fax: [phone]  [facility] [place], [place] [address] Phone: [phone] Fax: [phone]  [id number] [place], [place] - [place] [id number] [place] [id number] Phone: [phone] Fax: [phone]    Your procedure is scheduled on 12/01/15.  Report to [facility] Admitting at 530 A.M.  Call this number if you have problems the morning of surgery:  [phone]   Remember:  Do not eat food or drink liquids after midnight.  Take these medicines the morning of surgery with A SIP OF WATER --nexium,neurontin,bystolic,claritin   Do not wear jewelry, make-up or nail polish.  Do not wear lotions, powders, or perfumes.  You may wear deodorant.  Do not shave 48 hours prior to surgery.  Men may shave face and neck.  Do not bring valuables to the hospital.  [facility] is not responsible for any belongings or valuables.  Contacts, dentures or bridgework may not be worn into surgery.  Leave your suitcase in the car.  After surgery it may be brought to your room.  For patients admitted to the hospital, discharge time will be determined by your treatment team.  Patients discharged the day of surgery will not be allowed to drive home.   Name and phone number of your driver:   Special instructions:   Please read over the following fact sheets that you were given. Pain Booklet, Coughing and Deep Breathing and Surgical Site Infection Prevention

## 2015-11-25 ENCOUNTER — Encounter (HOSPITAL_COMMUNITY)
Admission: RE | Admit: 2015-11-25 | Discharge: 2015-11-25 | Disposition: A | Discharge status: Home / Self Care | Payer: PPO | Source: Ambulatory Visit | Attending: Surgery | Admitting: Surgery

## 2015-11-25 ENCOUNTER — Encounter (HOSPITAL_COMMUNITY): Payer: Self-pay

## 2015-11-25 DIAGNOSIS — Z01818 Encounter for other preprocedural examination: Secondary | ICD-10-CM | POA: Insufficient documentation

## 2015-11-25 DIAGNOSIS — Z79899 Other long term (current) drug therapy: Secondary | ICD-10-CM | POA: Diagnosis not present

## 2015-11-25 DIAGNOSIS — Z7982 Long term (current) use of aspirin: Secondary | ICD-10-CM | POA: Diagnosis not present

## 2015-11-25 DIAGNOSIS — I10 Essential (primary) hypertension: Secondary | ICD-10-CM | POA: Insufficient documentation

## 2015-11-25 DIAGNOSIS — G609 Hereditary and idiopathic neuropathy, unspecified: Secondary | ICD-10-CM | POA: Insufficient documentation

## 2015-11-25 DIAGNOSIS — K219 Gastro-esophageal reflux disease without esophagitis: Secondary | ICD-10-CM | POA: Diagnosis not present

## 2015-11-25 DIAGNOSIS — N6092 Unspecified benign mammary dysplasia of left breast: Secondary | ICD-10-CM | POA: Insufficient documentation

## 2015-11-25 DIAGNOSIS — Z01812 Encounter for preprocedural laboratory examination: Secondary | ICD-10-CM | POA: Diagnosis not present

## 2015-11-25 LAB — CBC
HCT: 41.8 % (ref 36.0–46.0)
Hemoglobin: 14.5 g/dL (ref 12.0–15.0)
MCH: 30.1 pg (ref 26.0–34.0)
MCHC: 34.7 g/dL (ref 30.0–36.0)
MCV: 86.9 fL (ref 78.0–100.0)
PLATELETS: 384 10*3/uL (ref 150–400)
RBC: 4.81 MIL/uL (ref 3.87–5.11)
RDW: 12.9 % (ref 11.5–15.5)
WBC: 9 10*3/uL (ref 4.0–10.5)

## 2015-11-25 LAB — BASIC METABOLIC PANEL
Anion gap: 12 (ref 5–15)
BUN: 8 mg/dL (ref 6–20)
CO2: 26 mmol/L (ref 22–32)
CREATININE: 0.68 mg/dL (ref 0.44–1.00)
Calcium: 9.6 mg/dL (ref 8.9–10.3)
Chloride: 101 mmol/L (ref 101–111)
GFR calc Af Amer: >60 mL/min (ref >60)
Glucose, Bld: 136 mg/dL — ABNORMAL HIGH (ref 65–99)
Potassium: 3.4 mmol/L — ABNORMAL LOW (ref 3.5–5.1)
SODIUM: 139 mmol/L (ref 135–145)

## 2015-11-29 NOTE — Progress Notes (Signed)
Anesthesia chart review: Patient is a 75 year old female scheduled for left breast biopsy on 12/01/2015 by Dr. [name]. Diagnosis: Atypical ductal hyperplasia of the left breast.  History includes nonsmoker, postoperative N/V, HTN, peripheral neuropathy (hereditary/idiopathic), gait abnormality, GERD, diastolic dysfunction (grade 1 2013), tachycardia (on b-blockers; held in the past due to fatigue), lumbar laminectomy, left rotator cuff repair, tonsillectomy, right knee arthroscopy 04/13/2015. BMI is consistent with obesity. PCP is Dr. [name]. Cardiologist is Dr. Fransico Him.  Meds include aspirin 81 mg, Nexium, Neurontin, HCTZ, Claritin, losartan, nebivolol, Crestor, Florastor.  03/22/15 EKG: NSR, non-specific ST/T wave abnormality.  04/16/12 Nuclear stress test: Impression: Post stress myocardial perfusion images show a normal pattern of perfusion in all areas. The post stress left ventricle is normal in size. There is no scintigraphic evidence of inducible myocardial ischemia. No regional wall motion abnormalities. Post stress EF 81%. Exercise capacity 7.0 METS. Exercise capacity is normal. Nondiagnostic ECG. Normal myocardial perfusion study.  04/16/12 Echo: Conclusions: 1. Normal 2-D, m-mode, color Doppler echo. 2. Left ventricular ejection fraction estimated at 74.2%. 3. Analysis of mitral valve inflow, pulmonary vein Doppler and tissue Doppler suggests grade 1 diastolic dysfunction without elevated left atrial pressure. 4. Trivial tricuspid regurgitation. 5. Trace mitral regurgitation.  Preoperative labs noted.   If no acute changes then I anticipate that she can proceed as planned.  George Hugh [facility] Short Stay Center/Anesthesiology Phone [phone] 11/29/2015 11:15 AM

## 2015-11-30 DIAGNOSIS — Z803 Family history of malignant neoplasm of breast: Secondary | ICD-10-CM | POA: Diagnosis not present

## 2015-11-30 DIAGNOSIS — R921 Mammographic calcification found on diagnostic imaging of breast: Secondary | ICD-10-CM | POA: Diagnosis not present

## 2015-11-30 DIAGNOSIS — R922 Inconclusive mammogram: Secondary | ICD-10-CM | POA: Diagnosis not present

## 2015-11-30 DIAGNOSIS — Z1231 Encounter for screening mammogram for malignant neoplasm of breast: Secondary | ICD-10-CM | POA: Diagnosis not present

## 2015-11-30 DIAGNOSIS — R928 Other abnormal and inconclusive findings on diagnostic imaging of breast: Secondary | ICD-10-CM | POA: Diagnosis not present

## 2015-11-30 DIAGNOSIS — Z0000 Encounter for general adult medical examination without abnormal findings: Secondary | ICD-10-CM | POA: Diagnosis not present

## 2015-11-30 MED ORDER — CEFAZOLIN SODIUM-DEXTROSE 2-3 GM-% IV SOLR
2.0000 g | INTRAVENOUS | Status: AC
  Administered 2015-12-01: 2 g via INTRAVENOUS
  Filled 2015-11-30: qty 50

## 2015-11-30 MED ORDER — CHLORHEXIDINE GLUCONATE 4 % EX LIQD: 1.0000 "application " | Freq: Once | CUTANEOUS | Status: DC

## 2015-12-01 ENCOUNTER — Ambulatory Visit (HOSPITAL_COMMUNITY)
Admission: RE | Admit: 2015-12-01 | Discharge: 2015-12-01 | Disposition: A | Discharge status: Home / Self Care | Payer: PPO | Source: Ambulatory Visit | Attending: Surgery | Admitting: Surgery

## 2015-12-01 ENCOUNTER — Ambulatory Visit (HOSPITAL_COMMUNITY): Payer: PPO | Admitting: Critical Care Medicine

## 2015-12-01 ENCOUNTER — Ambulatory Visit (HOSPITAL_COMMUNITY): Payer: PPO | Admitting: Vascular Surgery

## 2015-12-01 ENCOUNTER — Encounter (HOSPITAL_COMMUNITY)
Admission: RE | Disposition: A | Discharge status: Home / Self Care | Payer: Self-pay | Source: Ambulatory Visit | Attending: Surgery

## 2015-12-01 ENCOUNTER — Encounter (HOSPITAL_COMMUNITY): Payer: Self-pay | Admitting: *Deleted

## 2015-12-01 DIAGNOSIS — C50911 Malignant neoplasm of unspecified site of right female breast: Secondary | ICD-10-CM | POA: Diagnosis not present

## 2015-12-01 DIAGNOSIS — R921 Mammographic calcification found on diagnostic imaging of breast: Secondary | ICD-10-CM | POA: Diagnosis not present

## 2015-12-01 DIAGNOSIS — Z0000 Encounter for general adult medical examination without abnormal findings: Secondary | ICD-10-CM | POA: Diagnosis not present

## 2015-12-01 DIAGNOSIS — Z1231 Encounter for screening mammogram for malignant neoplasm of breast: Secondary | ICD-10-CM | POA: Diagnosis not present

## 2015-12-01 DIAGNOSIS — I10 Essential (primary) hypertension: Secondary | ICD-10-CM | POA: Insufficient documentation

## 2015-12-01 DIAGNOSIS — K219 Gastro-esophageal reflux disease without esophagitis: Secondary | ICD-10-CM | POA: Diagnosis not present

## 2015-12-01 DIAGNOSIS — Z79899 Other long term (current) drug therapy: Secondary | ICD-10-CM | POA: Insufficient documentation

## 2015-12-01 DIAGNOSIS — M1990 Unspecified osteoarthritis, unspecified site: Secondary | ICD-10-CM | POA: Diagnosis not present

## 2015-12-01 DIAGNOSIS — Z7982 Long term (current) use of aspirin: Secondary | ICD-10-CM | POA: Diagnosis not present

## 2015-12-01 DIAGNOSIS — Z803 Family history of malignant neoplasm of breast: Secondary | ICD-10-CM | POA: Diagnosis not present

## 2015-12-01 DIAGNOSIS — Z79890 Hormone replacement therapy (postmenopausal): Secondary | ICD-10-CM | POA: Insufficient documentation

## 2015-12-01 DIAGNOSIS — R922 Inconclusive mammogram: Secondary | ICD-10-CM | POA: Diagnosis not present

## 2015-12-01 DIAGNOSIS — C50912 Malignant neoplasm of unspecified site of left female breast: Secondary | ICD-10-CM | POA: Diagnosis not present

## 2015-12-01 DIAGNOSIS — N6092 Unspecified benign mammary dysplasia of left breast: Secondary | ICD-10-CM | POA: Diagnosis not present

## 2015-12-01 DIAGNOSIS — R928 Other abnormal and inconclusive findings on diagnostic imaging of breast: Secondary | ICD-10-CM | POA: Diagnosis not present

## 2015-12-01 HISTORY — PX: BREAST LUMPECTOMY WITH RADIOACTIVE SEED LOCALIZATION: SHX6424

## 2015-12-01 SURGERY — BREAST LUMPECTOMY WITH RADIOACTIVE SEED LOCALIZATION
Anesthesia: General | Site: Breast | Laterality: Left

## 2015-12-01 MED ORDER — FENTANYL CITRATE (PF) 250 MCG/5ML IJ SOLN
INTRAMUSCULAR | Status: AC
  Filled 2015-12-01: qty 5

## 2015-12-01 MED ORDER — BUPIVACAINE-EPINEPHRINE 0.25% -1:200000 IJ SOLN
INTRAMUSCULAR | Status: DC | PRN
  Administered 2015-12-01: 30 mL

## 2015-12-01 MED ORDER — LIDOCAINE HCL (CARDIAC) 10 MG/ML IV SOLN
INTRAVENOUS | Status: DC | PRN
  Administered 2015-12-01: 100 mg via INTRAVENOUS

## 2015-12-01 MED ORDER — ROCURONIUM BROMIDE 50 MG/5ML IV SOLN
INTRAVENOUS | Status: AC
  Filled 2015-12-01: qty 1

## 2015-12-01 MED ORDER — LIDOCAINE HCL (CARDIAC) 20 MG/ML IV SOLN
INTRAVENOUS | Status: AC
  Filled 2015-12-01: qty 5

## 2015-12-01 MED ORDER — DEXAMETHASONE SODIUM PHOSPHATE 4 MG/ML IJ SOLN
INTRAMUSCULAR | Status: DC | PRN
  Administered 2015-12-01: 4 mg via INTRAVENOUS

## 2015-12-01 MED ORDER — PROPOFOL 10 MG/ML IV BOLUS
INTRAVENOUS | Status: AC
  Filled 2015-12-01: qty 20

## 2015-12-01 MED ORDER — KETOROLAC TROMETHAMINE 30 MG/ML IJ SOLN: 30.0000 mg | Freq: Once | INTRAMUSCULAR | Status: DC | PRN

## 2015-12-01 MED ORDER — METOPROLOL TARTRATE 1 MG/ML IV SOLN
3.0000 mg | Freq: Once | INTRAVENOUS | Status: AC
  Administered 2015-12-01: 3 mg via INTRAVENOUS

## 2015-12-01 MED ORDER — BUPIVACAINE-EPINEPHRINE (PF) 0.25% -1:200000 IJ SOLN
INTRAMUSCULAR | Status: AC
  Filled 2015-12-01: qty 30

## 2015-12-01 MED ORDER — HYDROCODONE-ACETAMINOPHEN 5-325 MG PO TABS: 1.0000 | ORAL_TABLET | Freq: Four times a day (QID) | ORAL | Status: DC | PRN

## 2015-12-01 MED ORDER — PROMETHAZINE HCL 25 MG/ML IJ SOLN
6.2500 mg | INTRAMUSCULAR | Status: DC | PRN
  Administered 2015-12-01: 6.25 mg via INTRAVENOUS

## 2015-12-01 MED ORDER — DEXAMETHASONE SODIUM PHOSPHATE 4 MG/ML IJ SOLN
INTRAMUSCULAR | Status: AC
  Filled 2015-12-01: qty 1

## 2015-12-01 MED ORDER — FENTANYL CITRATE (PF) 100 MCG/2ML IJ SOLN
INTRAMUSCULAR | Status: DC | PRN
  Administered 2015-12-01: 25 ug via INTRAVENOUS
  Administered 2015-12-01: 50 ug via INTRAVENOUS

## 2015-12-01 MED ORDER — METOPROLOL TARTRATE 1 MG/ML IV SOLN
INTRAVENOUS | Status: AC
  Filled 2015-12-01: qty 5

## 2015-12-01 MED ORDER — OXYCODONE HCL 5 MG/5ML PO SOLN: 5.0000 mg | Freq: Once | ORAL | Status: DC | PRN

## 2015-12-01 MED ORDER — MIDAZOLAM HCL 5 MG/5ML IJ SOLN
INTRAMUSCULAR | Status: DC | PRN
  Administered 2015-12-01: 2 mg via INTRAVENOUS

## 2015-12-01 MED ORDER — 0.9 % SODIUM CHLORIDE (POUR BTL) OPTIME
TOPICAL | Status: DC | PRN
  Administered 2015-12-01: 1000 mL

## 2015-12-01 MED ORDER — LACTATED RINGERS IV SOLN
INTRAVENOUS | Status: DC | PRN
  Administered 2015-12-01: 07:00:00 via INTRAVENOUS

## 2015-12-01 MED ORDER — ONDANSETRON HCL 4 MG/2ML IJ SOLN
INTRAMUSCULAR | Status: DC | PRN
  Administered 2015-12-01: 4 mg via INTRAVENOUS

## 2015-12-01 MED ORDER — MIDAZOLAM HCL 2 MG/2ML IJ SOLN
INTRAMUSCULAR | Status: AC
  Filled 2015-12-01: qty 2

## 2015-12-01 MED ORDER — PROPOFOL 10 MG/ML IV BOLUS
INTRAVENOUS | Status: DC | PRN
  Administered 2015-12-01: 200 mg via INTRAVENOUS

## 2015-12-01 MED ORDER — OXYCODONE HCL 5 MG PO TABS: 5.0000 mg | ORAL_TABLET | Freq: Once | ORAL | Status: DC | PRN

## 2015-12-01 MED ORDER — FENTANYL CITRATE (PF) 100 MCG/2ML IJ SOLN: 25.0000 ug | INTRAMUSCULAR | Status: DC | PRN

## 2015-12-01 MED ORDER — PHENYLEPHRINE HCL 10 MG/ML IJ SOLN
INTRAMUSCULAR | Status: DC | PRN
  Administered 2015-12-01 (×3): 40 ug via INTRAVENOUS
  Administered 2015-12-01 (×2): 80 ug via INTRAVENOUS
  Administered 2015-12-01: 40 ug via INTRAVENOUS
  Administered 2015-12-01: 80 ug via INTRAVENOUS
  Administered 2015-12-01: 40 ug via INTRAVENOUS

## 2015-12-01 MED ORDER — PROMETHAZINE HCL 25 MG/ML IJ SOLN
INTRAMUSCULAR | Status: AC
  Filled 2015-12-01: qty 1

## 2015-12-01 SURGICAL SUPPLY — 47 items
APPLIER CLIP 9.375 MED OPEN (MISCELLANEOUS) ×2
BINDER BREAST LRG (GAUZE/BANDAGES/DRESSINGS) IMPLANT
BINDER BREAST XLRG (GAUZE/BANDAGES/DRESSINGS) ×2 IMPLANT
BLADE SURG 15 STRL LF DISP TIS (BLADE) ×1 IMPLANT
BLADE SURG 15 STRL SS (BLADE) ×1
CANISTER SUCTION 2500CC (MISCELLANEOUS) IMPLANT
CHLORAPREP W/TINT 26ML (MISCELLANEOUS) ×2 IMPLANT
CLIP APPLIE 9.375 MED OPEN (MISCELLANEOUS) ×1 IMPLANT
COVER PROBE W GEL 5X96 (DRAPES) ×2 IMPLANT
COVER SURGICAL LIGHT HANDLE (MISCELLANEOUS) ×2 IMPLANT
DERMABOND ADVANCED (GAUZE/BANDAGES/DRESSINGS) ×1
DERMABOND ADVANCED .7 DNX12 (GAUZE/BANDAGES/DRESSINGS) ×1 IMPLANT
DEVICE DUBIN SPECIMEN MAMMOGRA (MISCELLANEOUS) ×2 IMPLANT
DRAPE CHEST BREAST 15X10 FENES (DRAPES) ×2 IMPLANT
DRAPE UTILITY W/TAPE 26X15 (DRAPES) ×2 IMPLANT
ELECT CAUTERY BLADE 6.4 (BLADE) ×2 IMPLANT
ELECT REM PT RETURN 9FT ADLT (ELECTROSURGICAL) ×2
ELECTRODE REM PT RTRN 9FT ADLT (ELECTROSURGICAL) ×1 IMPLANT
GAUZE SPONGE 4X4 12PLY STRL (GAUZE/BANDAGES/DRESSINGS) ×2 IMPLANT
GLOVE BIO SURGEON STRL SZ7 (GLOVE) ×4 IMPLANT
GLOVE BIOGEL PI IND STRL 7.0 (GLOVE) ×1 IMPLANT
GLOVE BIOGEL PI INDICATOR 7.0 (GLOVE) ×1
GLOVE SURG SIGNA 7.5 PF LTX (GLOVE) ×4 IMPLANT
GOWN STRL REUS W/ TWL LRG LVL3 (GOWN DISPOSABLE) ×1 IMPLANT
GOWN STRL REUS W/ TWL XL LVL3 (GOWN DISPOSABLE) ×1 IMPLANT
GOWN STRL REUS W/TWL LRG LVL3 (GOWN DISPOSABLE) ×1
GOWN STRL REUS W/TWL XL LVL3 (GOWN DISPOSABLE) ×1
KIT BASIN OR (CUSTOM PROCEDURE TRAY) ×2 IMPLANT
KIT MARKER MARGIN INK (KITS) ×2 IMPLANT
NEEDLE HYPO 25X1 1.5 SAFETY (NEEDLE) ×2 IMPLANT
NS IRRIG 1000ML POUR BTL (IV SOLUTION) ×2 IMPLANT
PACK SURGICAL SETUP 50X90 (CUSTOM PROCEDURE TRAY) ×2 IMPLANT
PENCIL BUTTON HOLSTER BLD 10FT (ELECTRODE) ×2 IMPLANT
SPONGE LAP 18X18 X RAY DECT (DISPOSABLE) ×2 IMPLANT
SUT MNCRL AB 4-0 PS2 18 (SUTURE) ×2 IMPLANT
SUT SILK 2 0 SH (SUTURE) ×2 IMPLANT
SUT VIC AB 2-0 SH 27 (SUTURE) ×1
SUT VIC AB 2-0 SH 27XBRD (SUTURE) ×1 IMPLANT
SUT VIC AB 3-0 SH 27 (SUTURE) ×1
SUT VIC AB 3-0 SH 27X BRD (SUTURE) ×1 IMPLANT
SUT VIC AB 3-0 SH 8-18 (SUTURE) ×2 IMPLANT
SYR BULB 3OZ (MISCELLANEOUS) ×2 IMPLANT
SYR CONTROL 10ML LL (SYRINGE) ×2 IMPLANT
TOWEL OR 17X24 6PK STRL BLUE (TOWEL DISPOSABLE) ×2 IMPLANT
TOWEL OR 17X26 10 PK STRL BLUE (TOWEL DISPOSABLE) ×2 IMPLANT
TUBE CONNECTING 12X1/4 (SUCTIONS) IMPLANT
YANKAUER SUCT BULB TIP NO VENT (SUCTIONS) IMPLANT

## 2015-12-01 NOTE — OR Nursing (Addendum)
Pt w/ nausea on arrival to Phase II and accompanying increase BP. Nausea abated w/ phenergan admin, but BP elevation persist and pt c/o chest discomfort. EKG w/o change, no morphology indicating cardiac change.. Dr. Kalman Shan informed of assessments, interventions, responses and orders recvd.

## 2015-12-01 NOTE — Discharge Instructions (Signed)
[facility] - DISCHARGE INSTRUCTIONS TO PATIENT  Activity:  Driving - may drive in 2 or 3 days, if doing well   Lifting - no lifting more than 15 pounds for 5 days, then no limt  Wound Care:   May shower in 2 days  Diet:  As tolerated  Follow up appointment:  Call Dr. Pollie Friar office [facility]) at [phone] for an appointment in 2 weeks.  Medications and dosages:  Resume your home medications.  You have a prescription for:  Vicodin  Call Dr. Lucia Gaskins or his office  [phone]) if you have:  Temperature greater than 100.4,  Persistent nausea and vomiting,  Severe uncontrolled pain,  Redness, tenderness, or signs of infection (pain, swelling, redness, odor or green/yellow discharge around the site),  Difficulty breathing, headache or visual disturbances,  Any other questions or concerns you may have after discharge.  In an emergency, call 911 or go to an Emergency Department at a nearby hospital.

## 2015-12-01 NOTE — OR Nursing (Signed)
Pt describes chest pain abated; she and husband describe baseline esophageal-gastric junction discomfort and pt expresses that pain in "normal feeling". Dr Kalman Shan informed of all and pt okay for d/c.

## 2015-12-01 NOTE — Anesthesia Preprocedure Evaluation (Addendum)
Anesthesia Evaluation  Patient identified by MRN, date of birth, ID band Patient awake    Reviewed: Allergy & Precautions, NPO status , Patient's Chart, lab work & pertinent test results  History of Anesthesia Complications (+) PONV  Airway Mallampati: II  TM Distance: >3 FB Neck ROM: Full    Dental no notable dental hx.    Pulmonary neg pulmonary ROS,    Pulmonary exam normal breath sounds clear to auscultation       Cardiovascular hypertension, Pt. on medications Normal cardiovascular exam Rhythm:Regular Rate:Normal     Neuro/Psych negative neurological ROS  negative psych ROS   GI/Hepatic Neg liver ROS, GERD  Medicated,  Endo/Other  negative endocrine ROS  Renal/GU negative Renal ROS  negative genitourinary   Musculoskeletal negative musculoskeletal ROS (+)   Abdominal   Peds negative pediatric ROS (+)  Hematology negative hematology ROS (+)   Anesthesia Other Findings   Reproductive/Obstetrics negative OB ROS                            Anesthesia Physical Anesthesia Plan  ASA: II  Anesthesia Plan: General   Post-op Pain Management:    Induction: Intravenous  Airway Management Planned: LMA  Additional Equipment:   Intra-op Plan:   Post-operative Plan: Extubation in OR  Informed Consent: I have reviewed the patients History and Physical, chart, labs and discussed the procedure including the risks, benefits and alternatives for the proposed anesthesia with the patient or authorized representative who has indicated his/her understanding and acceptance.   Dental advisory given  Plan Discussed with: CRNA and Surgeon  Anesthesia Plan Comments:         Anesthesia Quick Evaluation

## 2015-12-01 NOTE — H&P (Signed)
Audrey [name]. Ragas  Location: [facility] Patient #: [id number] DOB: [birth year] Married / Language: English / Race: White Female   History of Present Illness   Patient words: New-Breast.   The patient is a 75 year old female who presents with a complaint of Breast problems.   Her PCP is Dr. Georges Mouse.  Comes with her husband.  She had a mammogram at [facility] on 10/13/2015 and 10/19/2015 which showed an 1.3 cm asymmetry in the left breast at 1 o'clock. She had a left breast biopsy on 10/25/2015 [phone]) - which showed ADH. She said that she had a lot of pain with the biopsy.  She has a mother who died of breast cancer. There are 2 aunts on her mother's side, one had breast cancer 1 had ovarian cancer. In the last year she's been put on a vaginal cream of Premarin she takes a couple times a week. She is a hold that we get the final pathology.  Plan: Left breast seed loc lumpectomy. I went over with her and her husband the indications and complications of surgery. I described possible infection, bleeding, and nerve injury. The final plan will depend on her final pathology.  Past Medical History: 1. HTN 2. Colonoscopy 2016 - [facility] 3. Right knee miniscus - arthroscopy - [facility] - 04/13/2015 4. Back surgery - [place] - 1996  Social History: Married.   Other Problems [name], CMA; 11/09/2015 10:39 AM) Arthritis Back Pain Gastroesophageal Reflux Disease High blood pressure Lump In Breast  Past Surgical History [name], CMA; 11/09/2015 10:39 AM) Knee Surgery Right. Shoulder Surgery Left. Spinal Surgery - Lower Back Tonsillectomy  Diagnostic Studies History [name], CMA; 11/09/2015 10:39 AM) Colonoscopy within last year Mammogram within last year Pap Smear 1-5 years ago  Allergies [name], CMA; 11/09/2015 10:39 AM) Cymbalta *ANTIDEPRESSANTS* Macrodantin *URINARY ANTI-INFECTIVES*  Medication  History ([name], CMA; 11/09/2015 10:42 AM) Gabapentin (300MG  Capsule, Oral) Active. Premarin (0.625MG /GM Cream, Vaginal) Active. Aspirin (81MG  Tablet, Oral) Active. Biotin (10MG  Tablet, Oral) Active. Calcium 600 + D (600-200MG -UNIT Tablet, Oral) Active. Vitamin D (1000UNIT Capsule, Oral) Active. Coenzyme Q10 (200MG  Capsule, Oral) Active. Cranberry (200MG  Capsule, Oral) Active. Vitamin B12 (1000MCG Tablet ER, Oral) Active. NexIUM (40MG  Capsule DR, Oral) Active. HydroCHLOROthiazide (12.5MG  Capsule, Oral) Active. Loratadine (10MG  Tablet, Oral) Active. Losartan Potassium (50MG  Tablet, Oral) Active. PreserVision AREDS 2 (Oral) Active. Bystolic (5MG  Tablet, Oral) Active. Crestor (10MG  Tablet, Oral) Active. Medications Reconciled  Social History [name], CMA; 11/09/2015 10:39 AM) Caffeine use Carbonated beverages, Coffee, Tea. No alcohol use No drug use Tobacco use Never smoker.  Family History [name], CMA; 11/09/2015 10:39 AM) Arthritis Mother. Breast Cancer Mother, Sister. Depression Daughter. Diabetes Mellitus Mother. Thyroid problems Daughter.  Pregnancy / Birth History [name], CMA; 11/09/2015 10:39 AM) Age at menarche 53 years. Age of menopause 44-50 Gravida 2 Maternal age 33-25 Para 2    Review of Systems [name] CMA; 11/09/2015 10:39 AM) General Not Present- Appetite Loss, Chills, Fatigue, Fever, Night Sweats, Weight Gain and Weight Loss. Skin Not Present- Change in Wart/Mole, Dryness, Hives, Jaundice, New Lesions, Non-Healing Wounds, Rash and Ulcer. HEENT Present- Seasonal Allergies and Wears glasses/contact lenses. Not Present- Earache, Hearing Loss, Hoarseness, Nose Bleed, Oral Ulcers, Ringing in the Ears, Sinus Pain, Sore Throat, Visual Disturbances and Yellow Eyes. Respiratory Not Present- Bloody sputum, Chronic Cough, Difficulty Breathing, Snoring and Wheezing. Breast Not Present- Breast Mass,  Breast Pain, Nipple Discharge and Skin Changes. Cardiovascular Not Present- Chest Pain, Difficulty Breathing  Lying Down, Leg Cramps, Palpitations, Rapid Heart Rate, Shortness of Breath and Swelling of Extremities. Gastrointestinal Present- Indigestion. Not Present- Abdominal Pain, Bloating, Bloody Stool, Change in Bowel Habits, Chronic diarrhea, Constipation, Difficulty Swallowing, Excessive gas, Gets full quickly at meals, Hemorrhoids, Nausea, Rectal Pain and Vomiting. Female Genitourinary Not Present- Frequency, Nocturia, Painful Urination, Pelvic Pain and Urgency. Musculoskeletal Present- Back Pain and Joint Pain. Not Present- Joint Stiffness, Muscle Pain, Muscle Weakness and Swelling of Extremities. Neurological Present- Numbness. Not Present- Decreased Memory, Fainting, Headaches, Seizures, Tingling, Tremor, Trouble walking and Weakness. Psychiatric Not Present- Anxiety, Bipolar, Change in Sleep Pattern, Depression, Fearful and Frequent crying. Endocrine Not Present- Cold Intolerance, Excessive Hunger, Hair Changes, Heat Intolerance, Hot flashes and New Diabetes. Hematology Not Present- Easy Bruising, Excessive bleeding, Gland problems, HIV and Persistent Infections.  Vitals ([name] CMA; 11/09/2015 10:39 AM) 11/09/2015 10:39 AM Weight: 170.6 lb Height: 59in Body Surface Area: 1.72 m Body Mass Index: 34.46 kg/m  Temp.: 98.53F(Oral)  Pulse: 72 (Regular)  BP: 140/84 (Sitting, Left Arm, Standard)   Physical Exam  General: WN older WF alert and generally healthy appearing. HEENT: Normal. Pupils equal.  Neck: Supple. No mass. No thyroid mass. Lymph Nodes: No supraclavicular or cervical nodes.  Lungs: Clear to auscultation and symmetric breath sounds. Heart: RRR. No murmur or rub.  Breasts: Right - No mass  Left - bruise fairly superior in upper left breast at 1 o'clock  Abdomen: Soft. No mass. No tenderness. No hernia. Normal bowel sounds. No abdominal  scars.  Extremities: Good strength and ROM in upper and lower extremities.  Neurologic: Grossly intact to motor and sensory function. Psychiatric: Has normal mood and affect. Behavior is normal.   Assessment & Plan  1.  ATYPICAL DUCTAL HYPERPLASIA OF LEFT BREAST (N60.92)  Story: Left breast biopsy on 10/25/2015 [phone]) - which showed ADH.  Plan:   Left breast biopsy  2. HTN 3. Right knee miniscus - arthroscopy - [facility] - 04/13/2015 4. Back surgery - [place]   [name], MD, [facility] Pager: [phone] Office phone:  [phone]

## 2015-12-01 NOTE — Op Note (Signed)
12/01/2015  8:45 AM  PATIENT:  Audrey [name] DOB: 1941-05-01 MRN: [id number]  PREOP DIAGNOSIS:  Atypical ductal hyperlasia left breast  POSTOP DIAGNOSIS:  Atypical ductal hyperlasia left breast at 2 o'clock  PROCEDURE:   Procedure(s): LEFT BREAST BIOPSY WITH RADIOACTIVE SEED LOCALIZATION  (note: the marker from the original biopsy was not near the mass and was not excised with the excision biopsy)  SURGEON:   [name], M.D.  ANESTHESIA:   general  Anesthesiologist: Myrtie Soman, MD CRNA: Rogers Blocker, CRNA  General  EBL:  miminal  ml  DRAINS: none   LOCAL MEDICATIONS USED:   30 cc 1/4% marcaine  SPECIMEN:   Left breast biopsy, suture medial  COUNTS CORRECT:  YES  INDICATIONS FOR PROCEDURE:  Audrey [name] is a 75 y.o. (DOB: Apr 01, 1941) white  female whose primary care physician is [name], MD and comes for left breast lumpectomy.   The options for treatment have been discussed with the patient. The ADH is needs to be excised and I explained this to the patient.    The indications and potential complications of surgery were explained to the patient. Potential complications include, but are not limited to, bleeding, infection, the need for further surgery, and nerve injury.     She had a I131 seed placed on 11/29/2014 in her left breast at [facility].  Note: the clip that was placed at the breast abnormality is about 4 cm cranial to the lesion and will probably not come out with lumpectomy.  I confirmed the presence of the I131 seed in the pre op area using the Neoprobe.  The seed is in the 2 o'clock position of the left breast.     OPERATIVE NOTE:   The patient was taken to room # 8 at [facility] where she underwent a general anesthesia  supervised by Anesthesiologist: Myrtie Soman, MD CRNA: Rogers Blocker, CRNA. Her left breast and axilla were prepped with  ChloraPrep and sterilely draped.    A time-out and the surgical check list was reviewed.    I turned  attention to the breast lesion which was about at the 2 o'clock position of the left breast.   I used the Neoprobe to identify the I131 seed.  I tried to excise an area around the tumor of at least 1 cm.    I excised this block of breast tissue approximately 4 cm by 5 cm  in diameter.   I painted the lumpectomy specimen with the 6 color paint kit and did a specimen mammogram which confirmed the mass, clip, and the seed were all in the right position in the specimen.  The specimen was sent to pathology who called back to confirm that they have the seed and the specimen.    I then irrigated the wound with saline. I infiltrated approximately 30 mL of 1% local between the incisions.  I then closed all the wounds in layers using 2-0 and 3-0 Vicryl sutures for the deep layer. At the skin, I closed the incisions with a 4-0 Monocryl suture. The incisions were then painted with LiquiBand.  She had gauze place over the wounds and placed in a breast binder.   The patient tolerated the procedure well, was transported to the recovery room in good condition. Sponge and needle count were correct at the end of the case.   Final pathology is pending.   [name], MD, [facility] Pager: [phone] Office phone:  [phone]

## 2015-12-01 NOTE — Progress Notes (Signed)
Doing well, has had no pain or issues, tolerating ginger ale, joking with staff re" messed up my hair" / ready fro discharge

## 2015-12-01 NOTE — Transfer of Care (Signed)
Immediate Anesthesia Transfer of Care Note  Patient: Audrey [name]  Procedure(s) Performed: Procedure(s): LEFT BREAST BIOPSYWITH RADIOACTIVE SEED LOCALIZATION (Left)  Patient Location: PACU  Anesthesia Type:General  Level of Consciousness: awake, alert , oriented and patient cooperative  Airway & Oxygen Therapy: Patient Spontanous Breathing  Post-op Assessment: Report given to RN, Post -op Vital signs reviewed and stable and Patient moving all extremities  Post vital signs: Reviewed and stable  Last Vitals:  Filed Vitals:   12/01/15 0628  BP: 145/72  Pulse: 85  Temp: 36.1 C  Resp: 20    Complications: No apparent anesthesia complications

## 2015-12-01 NOTE — Anesthesia Postprocedure Evaluation (Signed)
Anesthesia Post Note  Patient: Audrey [name]  Procedure(s) Performed: Procedure(s) (LRB): LEFT BREAST BIOPSYWITH RADIOACTIVE SEED LOCALIZATION (Left)  Patient location during evaluation: PACU Anesthesia Type: General Level of consciousness: awake and alert Pain management: pain level controlled Vital Signs Assessment: post-procedure vital signs reviewed and stable Respiratory status: spontaneous breathing and respiratory function stable Cardiovascular status: blood pressure returned to baseline and stable Postop Assessment: no signs of nausea or vomiting Anesthetic complications: no    Last Vitals:  Filed Vitals:   12/01/15 0915 12/01/15 0919  BP:  138/73  Pulse: 73 74  Temp:  36.6 C  Resp: 12 12    Last Pain: There were no vitals filed for this visit.               Purnell Daigle S

## 2015-12-01 NOTE — Anesthesia Procedure Notes (Signed)
Procedure Name: LMA Insertion Date/Time: 12/01/2015 7:37 AM Performed by: Rogers Blocker Pre-anesthesia Checklist: Patient identified, Timeout performed, Emergency Drugs available, Suction available and Patient being monitored Patient Re-evaluated:Patient Re-evaluated prior to inductionOxygen Delivery Method: Circle system utilized Preoxygenation: Pre-oxygenation with 100% oxygen Intubation Type: IV induction Ventilation: Mask ventilation without difficulty LMA: LMA inserted LMA Size: 4.0 Number of attempts: 1 Placement Confirmation: positive ETCO2,  CO2 detector and breath sounds checked- equal and bilateral Tube secured with: Tape Dental Injury: Teeth and Oropharynx as per pre-operative assessment

## 2015-12-02 ENCOUNTER — Encounter (HOSPITAL_COMMUNITY): Payer: Self-pay | Admitting: Surgery

## 2015-12-05 ENCOUNTER — Ambulatory Visit: Payer: PPO | Admitting: Neurology

## 2015-12-06 ENCOUNTER — Encounter: Payer: Self-pay | Admitting: Neurology

## 2015-12-06 ENCOUNTER — Telehealth: Payer: Self-pay | Admitting: Hematology

## 2015-12-06 ENCOUNTER — Ambulatory Visit (INDEPENDENT_AMBULATORY_CARE_PROVIDER_SITE_OTHER): Payer: PPO | Admitting: Neurology

## 2015-12-06 VITALS — BP 132/84 | HR 80 | Ht 59.0 in | Wt 171.5 lb

## 2015-12-06 DIAGNOSIS — G609 Hereditary and idiopathic neuropathy, unspecified: Secondary | ICD-10-CM | POA: Diagnosis not present

## 2015-12-06 DIAGNOSIS — R269 Unspecified abnormalities of gait and mobility: Secondary | ICD-10-CM | POA: Diagnosis not present

## 2015-12-06 MED ORDER — GABAPENTIN 300 MG PO CAPS: ORAL_CAPSULE | ORAL | Status: DC

## 2015-12-06 NOTE — Telephone Encounter (Signed)
new patient appt-s/w patietn and gave np appt for 01/12 @  2:30 w/Dr. Burr Medico Referring Dr. [name]  Referral information scanned

## 2015-12-06 NOTE — Patient Instructions (Signed)

## 2015-12-06 NOTE — Progress Notes (Signed)
Reason for visit:  Peripheral neuropathy  Audrey [name] is an 75 y.o. female  History of present illness:   Audrey [name] is a 75 year old right-handed white female with a history of a peripheral neuropathy documented by EMG and nerve conduction study. The patient recently has been diagnosed with early breast cancer. The patient is been on gabapentin taking 300 mg 3 times daily or her neuropathy, initially with excellent benefit. The patient comes back in with recent onset of discomfort that she claims is mainly within the knee joints bilaterally and in the joints of the hands bilaterally. When she gets up out of a chair, she will have significant pain in the knees, and when she starts to walk, the pain will lessen. The patient also indicates that she may have some discomfort in the joints of the hands with use of the hands. The patient does have some neck stiffness as well. The patient continues to be actively involved with yoga , she has difficulty performing some of the tasks with yoga that involved balance. The patient denies any falls. In the past, she has not then able to tolerate arthritis medications well. She returns to this office for an evaluation.  Past Medical History  Diagnosis Date  . 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 Obesity   . Reflux     buring and dysphagia sx resp to PPI; egd 3/06 neg for Barretts  . Hypertension   . Diastolic dysfunction   . Abnormality of gait 02/02/2015  . 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 Complication of anesthesia     difficulty awakening   . PONV (postoperative nausea and vomiting)   . Bladder infection     hx of last one approx 5 years ago   . Yeast infection     hx of   . GERD (gastroesophageal reflux disease)   . Arthritis   . Abnormality of gait   . Cancer [facility])     breast    Past Surgical History  Procedure Laterality Date  . Colonoscopy  3/06    NEG    . Lumbar laminectomy    . Belpharoptosis repair Bilateral   . Eye surgery      cosmetic; eyelid surgery bilat   . Left rotator cuff repair      12/2006  . Finger surgery      left index finger/ cyst 02/2007  . Cortisone injections      right arm; back  . Knee arthroscopy Right 04/13/2015    Procedure: ARTHROSCOPY RIGHT KNEE WITH MEDIAL MENISCAL DEBRIDEMENT AND CHONDROPLASTY;  Surgeon: Gaynelle Arabian, MD;  Location: [facility];  Service: Orthopedics;  Laterality: Right;  . Tonsillectomy    . Breast lumpectomy with radioactive seed localization Left 12/01/2015    Procedure: LEFT BREAST BIOPSYWITH RADIOACTIVE SEED LOCALIZATION;  Surgeon: [name], MD;  Location: [facility];  Service: General;  Laterality: Left;    Family History  Problem Relation Age of Onset  . Cancer Mother     breast  . Cancer Father     Hodgkin's disease  . Down syndrome Sister   . Alcohol abuse Brother     Social history:  reports that she has never smoked. She has never used smokeless tobacco. She reports that she does not drink alcohol or use illicit drugs.    Allergies  Allergen Reactions  . Cymbalta [Duloxetine Hcl]     Diarrhea  .  Other     Dermabond  . Macrodantin [Nitrofurantoin] Rash    Medications:  Prior to Admission medications   Medication Sig Start Date End Date Taking? Authorizing Provider  aspirin 81 MG tablet Take 81 mg by mouth daily.   Yes Historical Provider, MD  Biotin 10 MG TABS Take 1 tablet by mouth daily.   Yes Historical Provider, MD  Calcium Carb-Cholecalciferol (CALCIUM + D3) 600-200 MG-UNIT TABS Take 1 tablet by mouth daily.    Yes Historical Provider, MD  calcium carbonate (TUMS - DOSED IN MG ELEMENTAL CALCIUM) 500 MG chewable tablet Chew 1 tablet by mouth daily as needed for indigestion.    Yes Historical Provider, MD  cholecalciferol (VITAMIN D) 1000 UNITS tablet Take 2,000 Units by mouth daily.   Yes Historical Provider, MD  Co-Enzyme Q10 200 MG CAPS Take 1 capsule by mouth  daily.    Yes Historical Provider, MD  Cranberry 200 MG CAPS Take 1 capsule by mouth daily.    Yes Historical Provider, MD  Cyanocobalamin (VITAMIN B-12) 5000 MCG SUBL Place 1 tablet under the tongue daily.   Yes Historical Provider, MD  Esomeprazole Magnesium (NEXIUM PO) Take 1 capsule by mouth daily.   Yes Historical Provider, MD  gabapentin (NEURONTIN) 300 MG capsule One capsule twice during the day and 2 at night 12/06/15  Yes Kathrynn Ducking, MD  hydrochlorothiazide (MICROZIDE) 12.5 MG capsule Take 1 capsule (12.5 mg total) by mouth daily. 08/26/14  Yes Sueanne Margarita, MD  loratadine (CLARITIN) 10 MG tablet Take 10 mg by mouth daily.   Yes Historical Provider, MD  losartan (COZAAR) 50 MG tablet Take 50 mg by mouth every morning.    Yes Historical Provider, MD  Multiple Vitamins-Minerals (PRESERVISION AREDS 2) CAPS Take 1 tablet by mouth 2 (two) times daily.    Yes Historical Provider, MD  nebivolol (BYSTOLIC) 5 MG tablet Take 5 mg by mouth daily.   Yes Historical Provider, MD  rosuvastatin (CRESTOR) 10 MG tablet Take 10 mg by mouth 2 (two) times a week.   Yes Historical Provider, MD  saccharomyces boulardii (FLORASTOR) 250 MG capsule Take 250 mg by mouth daily.    Yes Historical Provider, MD    ROS:  Out of a complete 14 system review of symptoms, the patient complains only of the following symptoms, and all other reviewed systems are negative.   Food allergies  Joint pain , achy muscles, neck stiffness  Difficulty falling asleep  Blood pressure 132/84, pulse 80, height 4\' 11"  (1.499 m), weight 171 lb 8 oz (77.792 kg).  Physical Exam  General: The patient is alert and cooperative at the time of the examination. The patient has moderately obese.  Skin: No significant peripheral edema is noted.   Neurologic Exam  Mental status: The patient is alert and oriented x 3 at the time of the examination. The patient has apparent normal recent and remote memory, with an apparently normal  attention span and concentration ability.   Cranial nerves: Facial symmetry is present. Speech is normal, no aphasia or dysarthria is noted. Extraocular movements are full. Visual fields are full.  Motor: The patient has good strength in all 4 extremities.  Sensory examination: Soft touch sensation is symmetric on the face, arms, and legs.  Coordination: The patient has good finger-nose-finger and heel-to-shin bilaterally.  Gait and station: The patient has a normal gait. Tandem gait is normal. Romberg is negative. No drift is seen.  Reflexes: Deep tendon reflexes are symmetric, but  are depressed.   Assessment/Plan:   1. Peripheral neuropathy   2. Joint pain, bilateral knees and hands   The patient is having new symptoms with the knees and hands. She indicates that previously, the gabapentin seemed to help this discomfort. We will go up on the dose taking 300 mg twice during the day, 600 mg at night. If the discomfort does not improve in 3 weeks, she is to contact our office, we will try low-dose Mobic at that time. The patient has not tolerated nonsteroidal anti-inflammatory medications well in the past. The patient will follow-up through this office in about 6 months. A prescription was called in for the gabapentin.  Jill Alexanders MD 12/06/2015 4:43 PM  [facility] [address] [place], [place] [address]  Phone [phone] Fax [phone]

## 2015-12-08 ENCOUNTER — Ambulatory Visit: Payer: PPO | Admitting: Hematology

## 2015-12-08 ENCOUNTER — Encounter: Payer: Self-pay | Admitting: Radiation Oncology

## 2015-12-08 NOTE — Progress Notes (Signed)
Location of Breast Cancer: Left Breast  2 o'clock Position, Upper Outer Quadrant  Histology per Pathology Report: Diagnosis 10/25/15: Breast, left, needle core biopsy- ATYPICAL DUCTAL HYPERPLASIA  Receptor Status: ER(90%+), PR (20%+), Her2-neu ()  Did patient present with symptoms (if so, please note symptoms) or was this found on screening mammography?: routine screening 10/13/15 & 10/19/15  Past/Anticipated interventions by surgeon, if [facility]:[id number] 12/01/2015: Dr. Misty Stanley : Breast, lumpectomy, Left - INVASIVE DUCTAL CARCINOMA, SEE COMMENT.- INVASIVE TUMOR IS 8 MM FROM THE NEAREST MARGIN (SUPERIOR)- DUCTAL CARCINOMA IN SITU WITH NECROSIS AND CALCIFICATIONS. - IN SITU CARCINOMA IS 4 MM FROM THE NEAREST MARGIN (SUPERIOR)- PREVIOUS BIOPSY SITE TISSUE CHANGE.  Past/Anticipated interventions by medical oncology, if any: Chemotherapy :to be scheduled with Dr. Burr Medico,  Lymphedema issues, if any:  NO  Pain issues, if any:  NO, incision  slight pink ,glue on , patient is allergic to the glu,takes benadryl cream  On site   SAFETY ISSUES:  Prior radiation? NO  Pacemaker/ICD?  NO  Possible current pregnancy? N/A  Is the patient on methotrexate?  NO  Current Complaints / other details:Married,  Menarche age 10, G68P2, 1st live birth age    Non smoker, no alcohol or illicit drug use, no smokeless tobacco use,  Mother Breast Ca, 1 maternal aunt had breast ca, deceased,  another maternal aunt had ovarian cancer, Father  Hodgkins disease,  Allergies: Cymbalta-diarrhea,Dermabond,Macrodantin -rash    IMMEDIATELY FOLLOWING SURGERY: Do not drive or operate machinery for the first twenty four hours after surgery. Do not make any important decisions for twenty four hours after surgery or while taking narcotic pain medications or sedatives. If you develop intractable nausea and vomiting or a severe headache please notify your doctor immediately.   FOLLOW-UP: You do not need to follow up with  anesthesia unless specifically instructed to do so.   WOUND CARE INSTRUCTIONS (if applicable): Expect some mild vaginal bleeding, but if large amount of bleeding occurs please contact Dr. Sondra Come at [phone] or the Radiation On-Call physician. Call for any fever greater than 101.0 degrees or increasing vaginal//abdominal pain or trouble urinating.   QUESTIONS?: Please feel free to call your physician or the hospital operator if you have any questions, and they will be happy to assist you. Resume all medications: as listed on your after visit summary. Your next appointment is:  BP 160/69 mmHg  Pulse 77  Temp(Src) 98.4 F (36.9 C) (Oral)  Resp 20  Ht 4' 11" (1.499 m)  Wt 173 lb 3.2 oz (78.563 kg)  BMI 34.96 kg/m2  Wt Readings from Last 3 Encounters:  12/12/15 173 lb 3.2 oz (78.563 kg)  12/06/15 171 lb 8 oz (77.792 kg)  12/01/15 172 lb 8 oz (78.245 kg)    Audrey Eaton, RN 12/08/2015,11:24 AM

## 2015-12-09 ENCOUNTER — Telehealth: Payer: Self-pay | Admitting: *Deleted

## 2015-12-09 NOTE — Telephone Encounter (Signed)
Pt called to cancel appt with Dr. Lindi Adie d/t she prefers a female med onc. I sent a request to Dr. Burr Medico for a new pt appt and canceled appt with Dr. Lindi Adie.

## 2015-12-12 ENCOUNTER — Telehealth: Payer: Self-pay | Admitting: Neurology

## 2015-12-12 ENCOUNTER — Ambulatory Visit
Admission: RE | Admit: 2015-12-12 | Discharge: 2015-12-12 | Disposition: A | Discharge status: Home / Self Care | Payer: PPO | Source: Ambulatory Visit | Attending: Radiation Oncology | Admitting: Radiation Oncology

## 2015-12-12 ENCOUNTER — Encounter: Payer: Self-pay | Admitting: Radiation Oncology

## 2015-12-12 VITALS — BP 160/69 | HR 77 | Temp 98.4°F | Resp 20 | Ht 59.0 in | Wt 173.2 lb

## 2015-12-12 DIAGNOSIS — Z170 Estrogen receptor positive status [ER+]: Secondary | ICD-10-CM | POA: Diagnosis not present

## 2015-12-12 DIAGNOSIS — Z510 Encounter for antineoplastic radiation therapy: Secondary | ICD-10-CM | POA: Diagnosis not present

## 2015-12-12 DIAGNOSIS — C50412 Malignant neoplasm of upper-outer quadrant of left female breast: Secondary | ICD-10-CM | POA: Insufficient documentation

## 2015-12-12 HISTORY — DX: Allergy, unspecified, initial encounter: T78.40XA

## 2015-12-12 HISTORY — DX: Malignant neoplasm of unspecified site of unspecified female breast: C50.919

## 2015-12-12 NOTE — Telephone Encounter (Signed)
Hope with [facility] called inquiring if new script for  gabapentin (NEURONTIN) 300 MG capsule directions were intended to changed.

## 2015-12-12 NOTE — Telephone Encounter (Signed)
Last OV note says: We will go up on the dose taking 300 mg twice during the day, 600 mg at night.  I called back.  Got no answer.  Left message.

## 2015-12-12 NOTE — Progress Notes (Signed)
Radiation Oncology         [phone] ________________________________  Name: Audrey [name] MRN: [id number]  Date: 12/12/2015  DOB: 1941-01-25  PR:FF[facility]W Maudie Mercury, MD  [name], MD     REFERRING PHYSICIAN: [name], MD   DIAGNOSIS: The primary encounter diagnosis was Primary malignant neoplasm of upper outer quadrant of breast, left ([place]). A diagnosis of Breast cancer of upper-outer quadrant of left female breast [facility]) was also pertinent to this visit.  Breast cancer of upper-outer quadrant of left female breast ([place])   Staging form: Breast, AJCC 7th Edition     Clinical: Stage IA (T1c, N0, M0) - Unsigned    HISTORY OF PRESENT ILLNESS::Audrey [name] is a 75 y.o. female who is seen at the request of Dr. Lucia Gaskins for a new diagnosis of left breast cancer.   She underwent a screening mammogram 10/13/15 which showed abnormalities within the left breast which prompted a diagnositic study on 10/19/15 revealing a 1.3 cm asymmetry of the left breast at 1 o'clock. A biopsy on 10/25/15 revealed Atypical Ductal Hyperplasia.   She went on to meet with Dr. Lucia Gaskins who recommended seed localized lumpectomy which was performed on 12/01/15. 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 DCIS was also present. Margins were negative, though the invasive component was 15m, and the DCIS was 424mfrom the closest margin.    She plans to see Dr. NeLucia Gaskinsor follow-up on Thursday, 12/15/15.  PREVIOUS RADIATION THERAPY: No   PAST MEDICAL HISTORY:  has a past medical history of Tachycardia ( ); Obesity; Reflux; Hypertension; Diastolic dysfunction; Abnormality of gait (02/02/2015); Hereditary and idiopathic peripheral neuropathy (02/10/2015); Complication of anesthesia; PONV (postoperative nausea and vomiting); Bladder infection; Yeast infection; GERD (gastroesophageal reflux disease); Arthritis; Abnormality of gait; Cancer ([facility] (10/25/15); and Allergy.     PAST SURGICAL HISTORY: Past Surgical History  Procedure Laterality Date  . Colonoscopy  3/06    NEG  . Lumbar laminectomy    . Belpharoptosis repair Bilateral   . Eye surgery      cosmetic; eyelid surgery bilat   . Left rotator cuff repair      12/2006  . Finger surgery      left index finger/ cyst 02/2007  . Cortisone injections      right arm; back  . Knee arthroscopy Right 04/13/2015    Procedure: ARTHROSCOPY RIGHT KNEE WITH MEDIAL MENISCAL DEBRIDEMENT AND CHONDROPLASTY;  Surgeon: FrGaynelle ArabianMD;  [facility]: [facility];  Service: Orthopedics;  Laterality: Right;  . Tonsillectomy    . Breast lumpectomy with radioactive seed localization Left 12/01/2015    Procedure: LEFT BREAST BIOPSYWITH RADIOACTIVE SEED LOCALIZATION;  Surgeon: DaAlphonsa OverallMD;  [facility] Service: General;  Laterality: Left;     FAMILY HISTORY: family history includes Alcohol abuse in her brother; Cancer in her father, maternal aunt, maternal aunt, and mother; Diabetes in her mother; Down syndrome in her sister.   SOCIAL HISTORY:  reports that she has never smoked. She has never used smokeless tobacco. She reports that she does not drink alcohol or use illicit drugs.   ALLERGIES: Cymbalta; Other; and Macrodantin   MEDICATIONS:  Current Outpatient Prescriptions  Medication Sig Dispense Refill  . aspirin 81 MG tablet Take 81 mg by mouth daily.    . Biotin 10 MG TABS Take 1 tablet by mouth daily.    . Calcium Carb-Cholecalciferol (CALCIUM + D3) 600-200  MG-UNIT TABS Take 1 tablet by mouth daily.     . calcium carbonate (TUMS - DOSED IN MG ELEMENTAL CALCIUM) 500 MG chewable tablet Chew 1 tablet by mouth daily as needed for indigestion.     . cholecalciferol (VITAMIN D) 1000 UNITS tablet Take 2,000 Units by mouth daily.    Marland Kitchen Co-Enzyme Q10 200 MG CAPS Take 1 capsule by mouth daily.     . Cranberry 200 MG CAPS Take 1 capsule by mouth daily.     . Cyanocobalamin (VITAMIN B-12)  5000 MCG SUBL Place 1 tablet under the tongue daily.    . Esomeprazole Magnesium (NEXIUM PO) Take 1 capsule by mouth daily.    Marland Kitchen gabapentin (NEURONTIN) 300 MG capsule One capsule twice during the day and 2 at night 360 capsule 2  . hydrochlorothiazide (MICROZIDE) 12.5 MG capsule Take 1 capsule (12.5 mg total) by mouth daily. 90 capsule 1  . loratadine (CLARITIN) 10 MG tablet Take 10 mg by mouth daily as needed.     Marland Kitchen losartan (COZAAR) 50 MG tablet Take 50 mg by mouth every morning.     . Multiple Vitamins-Minerals (PRESERVISION AREDS 2) CAPS Take 1 tablet by mouth 2 (two) times daily.     . nebivolol (BYSTOLIC) 5 MG tablet Take 5 mg by mouth daily.    . rosuvastatin (CRESTOR) 10 MG tablet Take 10 mg by mouth 2 (two) times a week.    . saccharomyces boulardii (FLORASTOR) 250 MG capsule Take 250 mg by mouth daily.      No current facility-administered medications for this encounter.     REVIEW OF SYSTEMS:  A 15 point review of systems is documented in the electronic medical record. This was obtained by the nursing staff. However, I reviewed this with the patient to discuss relevant findings and make appropriate changes.  Pertinent items are noted in HPI.   Patient had a lumpectomy with Dr. Lucia Gaskins on 12/01/15. She is scheduled to see Dr. Lucia Gaskins for follow-up this Thursday, 1/19. Patient is very concerned about reviewing genetics results as she has five grandchildren. Her only issue with her recent lumpectomy was that she is allergic to Dermabond resulting in a lot of itching. Denies any weeping of the wound. She was given a prescription for vicodin but did not feel the need to fill it.   PHYSICAL EXAM:  height is '4\' 11"'$  (1.499 m) and weight is 173 lb 3.2 oz (78.563 kg). Her oral temperature is 98.4 F (36.9 C). Her blood pressure is 160/69 and her pulse is 77. Her respiration is 20.    ECOG = 1  0 - Asymptomatic (Fully active, able to carry on all predisease activities without restriction)  1 -  Symptomatic but completely ambulatory (Restricted in physically strenuous activity but ambulatory and able to carry out work of a light or sedentary nature. For example, light housework, office work)  2 - Symptomatic, <50% in bed during the day (Ambulatory and capable of all self care but unable to carry out any work activities. Up and about more than 50% of waking hours)  3 - Symptomatic, >50% in bed, but not bedbound (Capable of only limited self-care, confined to bed or chair 50% or more of waking hours)  4 - Bedbound (Completely disabled. Cannot carry on any self-care. Totally confined to bed or chair)  5 - Death   Eustace Pen MM, Creech RH, Tormey DC, et al. [phone]). "Toxicity and response criteria of the [facility]". Am. Lenna Sciara.  Clin. Oncol. 5 (6): 649-55  General: Well-developed, in no acute distress HEENT: Normocephalic, atraumatic; oral cavity clear Neck: Supple without any lymphadenopathy Cardiovascular: Regular rate and rhythm Respiratory: Clear to auscultation bilaterally Breasts: Well healing incision in the upper outer quadrant of the left breast with some mild erythema surrounding this. Negative breast exam on the right.  GI: Soft, nontender, normal bowel sounds Extremities: No edema present. No lymphadenopathy present. Neuro: No focal deficits    LABORATORY DATA:  Lab Results  Component Value Date   WBC 9.0 11/25/2015   HGB 14.5 11/25/2015   HCT 41.8 11/25/2015   MCV 86.9 11/25/2015   PLT 384 11/25/2015   Lab Results  Component Value Date   NA 139 11/25/2015   K 3.4* 11/25/2015   CL 101 11/25/2015   CO2 26 11/25/2015   No results found for: ALT, AST, GGT, ALKPHOS, BILITOT    RADIOGRAPHY: No results found.     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 She has completed surgery and appears to be doing satisfactorily postoperatively.  I discussed with the patient the role of  adjuvant radiation treatment in this setting. We discussed the potential benefit of radiation treatment, especially with regards to local control of the patient's tumor. We also discussed the possible side effects and risks of such a treatment as well.   All of the patient's questions were answered. The patient wishes to proceed with radiation treatment at the appropriate time.  PLAN: Patient is scheduled to follow-up with Dr. Lucia Gaskins this Thursday, 1/19. I will present her case at our multidisciplinary breast conference this Wednesday, 1/18. Patient did not have a lymph node dissection during her lumpectomy. She is aware that she may need to proceed with a sentinel lymph node biopsy.  Patient has not yet met with medical oncology.  The patient will proceed with simulation after a decision on lymph node dissection and chemotherapy treatment has been made.  I anticipate treating the patient with a course of adjuvant radiation treatment to the left breast for 4 weeks at the appropriate time.     ________________________________   [name], MD, PhD   **Disclaimer: This note was dictated with voice recognition software. Similar sounding words can inadvertently be transcribed and this note may contain transcription errors which may not have been corrected upon publication of note.**  This document serves as a record of services personally performed by Kyung Rudd, MD. It was created on his behalf by Arlyce Harman, a trained medical scribe. The creation of this record is based on the scribe's personal observations and the provider's statements to them. This document has been checked and approved by the attending provider.

## 2015-12-12 NOTE — Progress Notes (Signed)
Please see the Nurse Progress Note in the MD Initial Consult Encounter for this patient. 

## 2015-12-13 ENCOUNTER — Telehealth: Payer: Self-pay | Admitting: Hematology

## 2015-12-13 ENCOUNTER — Ambulatory Visit: Payer: PPO | Admitting: Hematology and Oncology

## 2015-12-13 NOTE — Telephone Encounter (Signed)
Lt mess regarding new pt for Friday 1/20 at 11

## 2015-12-15 NOTE — Telephone Encounter (Signed)
[facility] [phone] called to advise Rx submitted by Dr. Jannifer Franklin for gabapentin (NEURONTIN) 300 MG capsule. When we are submitting prescriptions electronically it is submitting under Dr. Tobey Grim name and Dr. Hart Robinsons DEA#. Hildred Alamin advised that we need to correct in our electronic prescribing system.

## 2015-12-15 NOTE — Telephone Encounter (Signed)
I called Audrey [name] to verify what DEA# they have on file. I left a voicemail asking her to call me back.

## 2015-12-16 ENCOUNTER — Ambulatory Visit (HOSPITAL_BASED_OUTPATIENT_CLINIC_OR_DEPARTMENT_OTHER): Payer: PPO | Admitting: Hematology

## 2015-12-16 ENCOUNTER — Other Ambulatory Visit: Payer: Self-pay | Admitting: Surgery

## 2015-12-16 ENCOUNTER — Encounter: Payer: Self-pay | Admitting: Hematology

## 2015-12-16 VITALS — BP 151/83 | HR 86 | Temp 98.4°F | Resp 18 | Ht 59.0 in | Wt 171.8 lb

## 2015-12-16 DIAGNOSIS — M1990 Unspecified osteoarthritis, unspecified site: Secondary | ICD-10-CM | POA: Diagnosis not present

## 2015-12-16 DIAGNOSIS — Z170 Estrogen receptor positive status [ER+]: Secondary | ICD-10-CM | POA: Diagnosis not present

## 2015-12-16 DIAGNOSIS — C50412 Malignant neoplasm of upper-outer quadrant of left female breast: Secondary | ICD-10-CM

## 2015-12-16 DIAGNOSIS — Z803 Family history of malignant neoplasm of breast: Secondary | ICD-10-CM

## 2015-12-16 DIAGNOSIS — C50912 Malignant neoplasm of unspecified site of left female breast: Secondary | ICD-10-CM

## 2015-12-16 DIAGNOSIS — I10 Essential (primary) hypertension: Secondary | ICD-10-CM

## 2015-12-16 DIAGNOSIS — Z8041 Family history of malignant neoplasm of ovary: Secondary | ICD-10-CM

## 2015-12-16 DIAGNOSIS — G629 Polyneuropathy, unspecified: Secondary | ICD-10-CM | POA: Insufficient documentation

## 2015-12-16 DIAGNOSIS — G6289 Other specified polyneuropathies: Secondary | ICD-10-CM

## 2015-12-16 NOTE — Progress Notes (Addendum)
[place]  [phone]) [phone] Fax:([phone]) [place] Note   Patient Care Team: [name], MD as PCP - General (Internal Medicine) Sueanne Margarita, MD as Consulting Physician (Cardiology) Jodi Marble, MD as Consulting Physician (Otolaryngology) 12/16/2015  REFERRAL PHYSICIAN: Dr. Lucia Gaskins   CHIEF COMPLAINTS/PURPOSE OF CONSULTATION:  Newly diagnosed breast cancer     Breast cancer of upper-outer quadrant of left female breast ([place])   10/25/2015 Initial Biopsy Left breast core needle biopsy showed atypical ductal Hyperplasia   12/01/2015 Receptors her2 ER 90% positive, PR 20% positive, HER-2 negative, Ki-67 5%   12/01/2015 Pathology Results Left breast lumpectomy showed invasive ductal carcinoma, 1.2 cm, grade 2, and DCIS with necrosis and calcification, margins were negative.   12/01/2015 Surgery Left breast lumpectomy, no sentinel lymph node biopsy.   12/01/2015 Initial Diagnosis Breast cancer of upper-outer quadrant of left female breast ([place])    HISTORY OF PRESENTING ILLNESS:  Audrey [name] 75 y.o. female is here because of her recently diagnosed left breast cancer.  This was dicovered by screening mammogram on 10/13/2015, which showed a 1.3 cm irregular focal asymmetry in the 1:00 position of left breast. She underwent diagnostic mammogram and ultrasound and core needle biopsy, which showed atypical ductal hyperplasia. She was referred to breast surgeon Dr. Lucia Gaskins. She underwent left breast lumpectomy on 11/30/2014, no sentinel lymph nodes. 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  She has recovered very well, she took a few tablets of Tylenol after surgery, the pain has resolved. She is back to her normal exercise routine without any issues.  She has arthritis, especially knee, shoudler and hands, takes Aleve evarage once a day. She is fairly active, exercise daily at home, including yaga and treadmill. She has good appetite and  eating well, no recent weight change. No other complaints.   GYN HISTORY  Menarchal: 13 LMP: 1991 Contraceptive: no  HRT: no  G2P2: (+) breast feeding     MEDICAL HISTORY:  Past Medical History  Diagnosis Date  . 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 Obesity   . Reflux     buring and dysphagia sx resp to PPI; egd 3/06 neg for Barretts  . Hypertension   . Diastolic dysfunction   . Abnormality of gait 02/02/2015  . 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 Complication of anesthesia     difficulty awakening   . PONV (postoperative nausea and vomiting)   . Bladder infection     hx of last one approx 5 years ago   . Yeast infection     hx of   . GERD (gastroesophageal reflux disease)   . Arthritis   . Abnormality of gait   . Cancer [facility])     breast  . Breast cancer ([place]) 10/25/15    left breast  ADH   . Allergy     SURGICAL HISTORY: Past Surgical History  Procedure Laterality Date  . Colonoscopy  3/06    NEG  . Lumbar laminectomy    . Belpharoptosis repair Bilateral   . Eye surgery      cosmetic; eyelid surgery bilat   . Left rotator cuff repair      12/2006  . Finger surgery      left index finger/ cyst 02/2007  . Cortisone injections      right arm; back  . Knee arthroscopy Right 04/13/2015    Procedure:  ARTHROSCOPY RIGHT KNEE WITH MEDIAL MENISCAL DEBRIDEMENT AND CHONDROPLASTY;  Surgeon: Gaynelle Arabian, MD;  Location: [facility];  Service: Orthopedics;  Laterality: Right;  . Tonsillectomy    . Breast lumpectomy with radioactive seed localization Left 12/01/2015    Procedure: LEFT BREAST [place] RADIOACTIVE SEED LOCALIZATION;  Surgeon: [name], MD;  Location: [place];  Service: General;  Laterality: Left;    SOCIAL HISTORY: Social History   Social History  . Marital Status: Married    Spouse Name: N/A  . Number of Children: 2, daughter 58, son 54  . Years of Education: 13   Occupational  History  . retired    Social History Main Topics  . Smoking status: Never Smoker   . Smokeless tobacco: Never Used  . Alcohol Use: No  . Drug Use: No  . Sexual Activity: Not on file   Other Topics Concern  . Not on file   Social History Narrative   Patient is right handed.   Patient drinks 2-3 cups of caffeine per day.    FAMILY HISTORY: Family History  Problem Relation Age of Onset  . Cancer Mother     breast  . Diabetes Mother   . Cancer Father     Hodgkin's disease  . Down syndrome Sister   . Cancer Sister 24    brast cancer (half sister)  . Alcohol abuse Brother   . Cancer Maternal Aunt     breast  . Cancer Maternal Aunt     ovarian ca  . Cancer Paternal Aunt 70    Breast cancer     ALLERGIES:  is allergic to cymbalta; other; and macrodantin.  MEDICATIONS:  Current Outpatient Prescriptions  Medication Sig Dispense Refill  . aspirin 81 MG tablet Take 81 mg by mouth daily.    . Biotin 10 MG TABS Take 1 tablet by mouth daily.    . Calcium Carb-Cholecalciferol (CALCIUM + D3) 600-200 MG-UNIT TABS Take 1 tablet by mouth daily.     . calcium carbonate (TUMS - DOSED IN MG ELEMENTAL CALCIUM) 500 MG chewable tablet Chew 1 tablet by mouth daily as needed for indigestion.     . cholecalciferol (VITAMIN D) 1000 UNITS tablet Take 2,000 Units by mouth daily.    Marland Kitchen Co-Enzyme Q10 200 MG CAPS Take 1 capsule by mouth daily.     . Cranberry 200 MG CAPS Take 1 capsule by mouth daily.     . Cyanocobalamin (VITAMIN B-12) 5000 MCG SUBL Place 1 tablet under the tongue daily.    . Esomeprazole Magnesium (NEXIUM PO) Take 1 capsule by mouth daily.    Marland Kitchen gabapentin (NEURONTIN) 300 MG capsule One capsule twice during the day and 2 at night 360 capsule 2  . hydrochlorothiazide (MICROZIDE) 12.5 MG capsule Take 1 capsule (12.5 mg total) by mouth daily. 90 capsule 1  . loratadine (CLARITIN) 10 MG tablet Take 10 mg by mouth daily as needed.     Marland Kitchen losartan (COZAAR) 50 MG tablet Take 50 mg by  mouth every morning.     . Multiple Vitamins-Minerals (PRESERVISION AREDS 2) CAPS Take 1 tablet by mouth 2 (two) times daily.     . nebivolol (BYSTOLIC) 5 MG tablet Take 5 mg by mouth daily.    . rosuvastatin (CRESTOR) 10 MG tablet Take 10 mg by mouth 2 (two) times a week.    . saccharomyces boulardii (FLORASTOR) 250 MG capsule Take 250 mg by mouth daily.      No current facility-administered medications  for this visit.    REVIEW OF SYSTEMS:   Constitutional: Denies fevers, chills or abnormal night sweats Eyes: Denies blurriness of vision, double vision or watery eyes Ears, nose, mouth, throat, and face: Denies mucositis or sore throat Respiratory: Denies cough, dyspnea or wheezes Cardiovascular: Denies palpitation, chest discomfort or lower extremity swelling Gastrointestinal:  Denies nausea, heartburn or change in bowel habits Skin: Denies abnormal skin rashes Lymphatics: Denies new lymphadenopathy or easy bruising Neurological:Denies numbness, tingling or new weaknesses Behavioral/Psych: Mood is stable, no new changes  All other systems were reviewed with the patient and are negative.  PHYSICAL EXAMINATION: ECOG PERFORMANCE STATUS: 0 - Asymptomatic  Filed Vitals:   12/16/15 1103  BP: 151/83  Pulse: 86  Temp: 98.4 F (36.9 C)  Resp: 18   Filed Weights   12/16/15 1103  Weight: 171 lb 12.8 oz (77.928 kg)    GENERAL:alert, no distress and comfortable SKIN: skin color, texture, turgor are normal, no rashes or significant lesions EYES: normal, conjunctiva are pink and non-injected, sclera clear OROPHARYNX:no exudate, no erythema and lips, buccal mucosa, and tongue normal  NECK: supple, thyroid normal size, non-tender, without nodularity LYMPH:  no palpable lymphadenopathy in the cervical, axillary or inguinal LUNGS: clear to auscultation and percussion with normal breathing effort HEART: regular rate & rhythm and no murmurs and no lower extremity edema ABDOMEN:abdomen soft,  non-tender and normal bowel sounds Musculoskeletal:no cyanosis of digits and no clubbing  PSYCH: alert & oriented x 3 with fluent speech NEURO: no focal motor/sensory deficits  LABORATORY DATA:  I have reviewed the data as listed Lab Results  Component Value Date   WBC 9.0 11/25/2015   HGB 14.5 11/25/2015   HCT 41.8 11/25/2015   MCV 86.9 11/25/2015   PLT 384 11/25/2015    Recent Labs  02/02/15 1556 11/25/15 1412  NA  --  139  K  --  3.4*  CL  --  101  CO2  --  26  GLUCOSE  --  136*  BUN  --  8  CREATININE  --  0.68  CALCIUM  --  9.6  GFRNONAA  --  >60  GFRAA  --  >60  PROT 7.1  --    PATHOLOGY REPORT:  Diagnosis 10/25/2015 Breast, left, needle core biopsy - ATYPICAL DUCTAL HYPERPLASIA. Microscopic Comment The findings are called to [facility] on 10/26/2015. Dr. Donato Heinz has seen this case in consultation with agreement. (RH:kh 10/26/2015)   Diagnosis 12/01/2015 Breast, lumpectomy, left - INVASIVE DUCTAL CARCINOMA, SEE COMMENT. - INVASIVE TUMOR IS 8 MM FROM THE NEAREST MARGIN (SUPERIOR) - DUCTAL CARCINOMA IN SITU WITH NECROSIS AND CALCIFICATIONS. - IN SITU CARCINOMA IS 4 MM FROM THE NEAREST MARGIN (SUPERIOR) - PREVIOUS BIOPSY SITE TISSUE CHANGE. - SEE TUMOR SYNOPTIC TEMPLATE BELOW. Microscopic Comment BREAST, INVASIVE TUMOR, WITHOUT LYMPH NODES PRESENT Specimen, including laterality : Left breast without sentinel lymph node sampling Procedure: Lumpectomy Histologic type: Ductal Grade: 2 of 3 Tubule formation: 3 Nuclear pleomorphism: 2 Mitotic: 1 Tumor size (gross measurement): 1.2 cm Margins: Invasive, distance to closest margin: 8 mm In-situ, distance to closest margin: 4 mm If margin positive, focally or broadly: N/A Lymphovascular invasion: Absent Ductal carcinoma in situ: Present Grade: 2 of 3 Extensive intraductal component: Absent Lobular neoplasia: Absent Tumor focality: Unifocal Treatment effect:  None If present, treatment effect in breast tissue, lymph nodes or both: N/A Extent of tumor: Skin: N/A Nipple: N/A Skeletal muscle: N/A Breast prognostic profile: Estrogen receptor: Pending  and will be reported in an addendum Progesterone receptor: Pending and will be reported in an addendum. Her 2 neu: Pending and will be reported in an addendum Ki-67: Pending and will be reported in an addendum. Non-neoplastic breast: Previous biopsy site tissue change, fibrocystic changes and microcalcifications. TNM: pT1c, pNX, pMX Comments: Multiple recut sections were examined. The absence of the myoepithelial layer was confirmed with smooth muscle myosin heavy chain, Calponin and p63 immunostains (blocks 1A and 1C). (CR:kh 12-05-15)  Results: IMMUNOHISTOCHEMICAL AND MORPHOMETRIC ANALYSIS PERFORMED MANUALLY Estrogen Receptor: 90%, POSITIVE, STRONG STAINING INTENSITY Progesterone Receptor: 20%, POSITIVE, STRONG STAINING INTENSITY Proliferation Marker Ki67: 5%  Results: HER2 - NEGATIVE RATIO OF HER2/CEP17 SIGNALS 1.33 AVERAGE HER2 COPY NUMBER PER CELL 2.85  RADIOGRAPHIC STUDIES: I have personally reviewed the radiological images as listed and agreed with the findings in the report. No results found.  ASSESSMENT & PLAN:  75 year old Caucasian female, with past medical history of hypertension, arthritis, presented with screening discovered left breast cancer.  1. 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 Although she is 75 year old, She is in good health, very active, previously tolerated lumpectomy very well, and is very motivated to have the second surgery, so I encouraged her to consider sentinel lymph node biopsy to get more information. -We reviewed the natural  history of early-stage ER/PR positive HER-2 negative breast cancer, if lymph node negative, she will likely do very well. The risk of cancer recurrence were discussed with her, and adjuvant strategy to reduce the risk of cancer recurrence, which includes chemotherapy, endocrine therapy, and breast radiation were reviewed with her in general. -If her lymph nodes negative, I recommend her to have Oncotype DX test for further recurrent risk stratification, and decide about adjuvant chemotherapy. Given her low-Ki-67, she likely would have low risk score. -If she decided not to have sentinel lymph node biopsy, or if her sentinel lymph node biopsy were positive, given the low-grade tumor, I will send her tumor for Mammaprint genomic testing to see if she needs adjuvant chemotherapy -Giving the strong ER/PR positivity, I recommend her to have a aromatase inhibitor as adjuvant endocrine therapy to reduce risk of cancer recurrence. She has moderate arthritis, I recommend exemestane  -She is scheduled to see radiation oncologist Dr. Lisbeth Renshaw next week to discuss adjuvant radiation.   2. HTN, arthritis  -She will continue follow-up with her primary care physician, and exercise  3. Genetics -She has strong family history of breast and ovarian cancer. -She had genetic testing done at [facility] and was negative for BRCA1, BRCA2, CHECK2, ATM, BARD1, MLH1, MSH2, MSH6, PALB2, PMS2, PTEN, RAD51C, RAD51D, STK11 and TP53  Plan -she will likely proceed with SLN biopsy by Dr. Lucia Gaskins -I will send Oncotype Dx if node negative, or mammaprint if node positive -I will see her 2-3 weeks after her second surgery to go over the above test result. I spokw with our breast navigator Dawn about the plan and she will schedule her follow up appointment    All questions were answered. The patient knows to call the clinic with any problems, questions or concerns. I spent 55 minutes counseling the patient face to face. The total  time spent in the appointment was 60 minutes and more than 50% was on counseling.     Truitt Merle, MD  12/16/2015 12:15 PM

## 2015-12-16 NOTE — Telephone Encounter (Signed)
I called Audrey [name] to verify what DEA# they have on file. I left a voicemail asking her to call me back.

## 2015-12-19 ENCOUNTER — Telehealth: Payer: Self-pay | Admitting: Neurology

## 2015-12-19 NOTE — Telephone Encounter (Signed)
I received this message from phone staff: Wynelle Link with [facility] is call with the following DEA #: Dr. Tollie Pizza [id number]. Dr. Jannifer Franklin [id number]. Please call her if additional info is needed [phone].   They have the correct DEA#.

## 2015-12-19 NOTE — Telephone Encounter (Signed)
Please see telephone note from 12/12/15.

## 2015-12-19 NOTE — Telephone Encounter (Signed)
Audrey [name] with [facility] is call with the following DEA #:  Dr. Tollie Pizza [id number].  Dr. Jannifer Franklin [id number].  Please call her if additional info is needed [phone]

## 2015-12-21 ENCOUNTER — Encounter (HOSPITAL_BASED_OUTPATIENT_CLINIC_OR_DEPARTMENT_OTHER): Payer: Self-pay | Admitting: *Deleted

## 2015-12-23 ENCOUNTER — Encounter (HOSPITAL_BASED_OUTPATIENT_CLINIC_OR_DEPARTMENT_OTHER)
Admission: RE | Admit: 2015-12-23 | Discharge: 2015-12-23 | Disposition: A | Discharge status: Home / Self Care | Payer: PPO | Source: Ambulatory Visit | Attending: Surgery | Admitting: Surgery

## 2015-12-23 DIAGNOSIS — C50412 Malignant neoplasm of upper-outer quadrant of left female breast: Secondary | ICD-10-CM | POA: Diagnosis not present

## 2015-12-23 DIAGNOSIS — Z79890 Hormone replacement therapy (postmenopausal): Secondary | ICD-10-CM | POA: Diagnosis not present

## 2015-12-23 DIAGNOSIS — Z170 Estrogen receptor positive status [ER+]: Secondary | ICD-10-CM | POA: Diagnosis not present

## 2015-12-23 DIAGNOSIS — Z803 Family history of malignant neoplasm of breast: Secondary | ICD-10-CM | POA: Diagnosis not present

## 2015-12-23 DIAGNOSIS — K219 Gastro-esophageal reflux disease without esophagitis: Secondary | ICD-10-CM | POA: Diagnosis not present

## 2015-12-23 DIAGNOSIS — M1990 Unspecified osteoarthritis, unspecified site: Secondary | ICD-10-CM | POA: Insufficient documentation

## 2015-12-23 DIAGNOSIS — Z01812 Encounter for preprocedural laboratory examination: Secondary | ICD-10-CM | POA: Insufficient documentation

## 2015-12-23 DIAGNOSIS — I10 Essential (primary) hypertension: Secondary | ICD-10-CM | POA: Diagnosis not present

## 2015-12-23 DIAGNOSIS — M797 Fibromyalgia: Secondary | ICD-10-CM | POA: Diagnosis not present

## 2015-12-23 DIAGNOSIS — Z7982 Long term (current) use of aspirin: Secondary | ICD-10-CM | POA: Diagnosis not present

## 2015-12-23 DIAGNOSIS — Z79899 Other long term (current) drug therapy: Secondary | ICD-10-CM | POA: Diagnosis not present

## 2015-12-23 DIAGNOSIS — C50912 Malignant neoplasm of unspecified site of left female breast: Secondary | ICD-10-CM | POA: Diagnosis not present

## 2015-12-23 LAB — BASIC METABOLIC PANEL
Anion gap: 12 (ref 5–15)
BUN: 10 mg/dL (ref 6–20)
CALCIUM: 9.5 mg/dL (ref 8.9–10.3)
CO2: 29 mmol/L (ref 22–32)
CREATININE: 0.83 mg/dL (ref 0.44–1.00)
Chloride: 99 mmol/L — ABNORMAL LOW (ref 101–111)
GFR calc Af Amer: >60 mL/min (ref >60)
GLUCOSE: 119 mg/dL — AB (ref 65–99)
Potassium: 3.8 mmol/L (ref 3.5–5.1)
Sodium: 140 mmol/L (ref 135–145)

## 2015-12-26 NOTE — H&P (Signed)
Audrey [name]. Audrey [name]  Location: [facility] Patient #: [id number] DOB: [birth year] Married / Language: English / Race: White Female  History of Present Illness   The patient is a 75 year old female who presents with a complaint of Breast problems.  Her PCP is Dr. Georges Mouse.  Comes with her husband.   She is doing well, unfortunately her right breast biopsy on 12/01/2015 ([id number]) showed 1.2 cm IDC, clear margins, ER - 90%, PR - 20%, Ki67 - 5%, Her2Neu - neg. She has seen Dr. Lisbeth Renshaw. She sees Dr. Burr Medico tomorrow.  She was presented at [facility] - 12/14/2015. Dr. Burr Medico sees her 12/16/2015 - she will let me know if the patient will need a SLNBx. We will probably get one.   I discussed the options for breast cancer treatment with the patient. I discussed a multidisciplinary approach to the treatment of breast cancer, which includes medical oncology and radiation oncology. I discussed the surgical options of lumpectomy vs. mastectomy. If mastectomy, there is the possibility of reconstruction. I discussed the options of lymph node biopsy. The treatment plan depends on the pathologic staging of the tumor and the patient's personal wishes.  The risks of surgery include, but are not limited to, bleeding, infection, the need for further surgery, and nerve injury. The patient has been given literature on the treatment of breast cancer.  Plan: 1) she has already had a lumptectomy, 2) the question is whether she needs a lymph node biopsy, 3) oncotype, 4) radation tx, 5) anti estrogen  History of right breast cancer (11/2015): She had a [facility] at [facility] on 10/13/2015 and 10/19/2015 which showed an 1.3 cm asymmetry in the left breast at 1 o'clock. She had a left breast biopsy on 10/25/2015 [phone]) - which showed ADH. She said that she had a lot of pain with the biopsy.  She has a mother who died of breast cancer. There are 2 aunts on  her mother's side, one had breast cancer 1 had ovarian cancer. In the last year she's been put on a vaginal cream of Premarin she takes a couple times a week. She is a hold that we get the final pathology.  Past Medical History: 1. HTN 2. Colonoscopy 2016 - [facility] 3. Right knee miniscus - arthroscopy - [facility] - 04/13/2015 4. Back surgery - [place] - 1996  Social History: Married.   Other Problems Shann Medal, MD; 12/15/2015 1:50 PM) Arthritis Back Pain Gastroesophageal Reflux Disease High blood pressure Lump In Breast  Past Surgical History Shann Medal, MD; 12/15/2015 1:50 PM) Knee Surgery Right. Shoulder Surgery Left. Spinal Surgery - Lower Back Tonsillectomy  Allergies Elbert Ewings, CMA; 12/15/2015 1:38 PM) Cymbalta *ANTIDEPRESSANTS* Macrodantin *URINARY ANTI-INFECTIVES*  Medication History Elbert Ewings, CMA; 12/15/2015 1:39 PM) Gabapentin ('300MG'$  Capsule, Oral four times daily) Active. Premarin (0.'625MG'$ /GM Cream, Vaginal) Active. Aspirin ('81MG'$  Tablet, Oral) Active. Biotin ('10MG'$  Tablet, Oral) Active. Calcium 600 + D (600-'200MG'$ -UNIT Tablet, Oral) Active. Vitamin D (1000UNIT Capsule, Oral) Active. Coenzyme Q10 ('200MG'$  Capsule, Oral) Active. Cranberry ('200MG'$  Capsule, Oral) Active. Vitamin B12 (1000MCG Tablet ER, Oral) Active. NexIUM ('40MG'$  Capsule DR, Oral) Active. HydroCHLOROthiazide (12.'5MG'$  Capsule, Oral) Active. Loratadine ('10MG'$  Tablet, Oral) Active. Losartan Potassium ('50MG'$  Tablet, Oral) Active. PreserVision AREDS 2 (Oral) Active. Bystolic ('5MG'$  Tablet, Oral) Active. Crestor ('10MG'$  Tablet, Oral) Active. Medications Reconciled  Vitals Elbert Ewings CMA; 12/15/2015 1:39 PM) 12/15/2015 1:39 PM Weight: 173 lb Height: 59in Body Surface Area: 1.73 m Body Mass Index: 34.94 kg/m  Temp.: 98.51F(Temporal)  Pulse: 88 (Regular)  BP: 126/72 (Sitting, Left Arm, Standard)  Physical Exam: General: WN older WF alert and  generally healthy appearing. HEENT: Normal. Pupils equal.  Neck: Supple. No mass. No thyroid mass.  Lymph Nodes: No supraclavicular or cervical nodes.  Lungs: Clear to auscultation and symmetric breath sounds. Heart: RRR. No murmur or rub.  Breasts: Right - No mass Left - Incision in UOQ of left breast looks good.  Extremities: Good strength and ROM in upper and lower extremities.   She complains of right arm bruisinig from IV.  Neurologic: Grossly intact to motor and sensory function. Psychiatric: Has normal mood and affect. Behavior is normal.  Assessment & Plan: 1.  BREAST CANCER, STAGE 1, LEFT (C50.912)  Story: Left breast biopsy on 10/25/2015 ([id number]) - which showed ADH.   Left breast biopsy - 12/01/2015 ([id number]) - 1.2 cm IDC, clear margins, ER - 90%, PR - 20%, Ki67 - 5%, Her2Neu - neg  Plan:   1) to see Drs. Feng. Already saw Dr. Lisbeth Renshaw   2) possible lymph node biopsy   Addendum Note(Debrah Granderson H. Lucia Gaskins MD; 12/16/2015 1:48 PM) She met with Dr. Burr Medico today. We will go ahead and schedule her left axillary SLNBx.  2.  HTN 3. Right knee miniscus - arthroscopy - [facility] - 04/13/2015 4. Back surgery - [place]  [name], MD, [facility] Pager: [phone] Office phone:  [phone]

## 2015-12-27 ENCOUNTER — Ambulatory Visit (HOSPITAL_BASED_OUTPATIENT_CLINIC_OR_DEPARTMENT_OTHER): Payer: PPO | Admitting: Anesthesiology

## 2015-12-27 ENCOUNTER — Encounter (HOSPITAL_BASED_OUTPATIENT_CLINIC_OR_DEPARTMENT_OTHER)
Admission: RE | Disposition: A | Discharge status: Home / Self Care | Payer: Self-pay | Source: Ambulatory Visit | Attending: Surgery

## 2015-12-27 ENCOUNTER — Ambulatory Visit (HOSPITAL_BASED_OUTPATIENT_CLINIC_OR_DEPARTMENT_OTHER)
Admission: RE | Admit: 2015-12-27 | Discharge: 2015-12-27 | Disposition: A | Discharge status: Home / Self Care | Payer: PPO | Source: Ambulatory Visit | Attending: Surgery | Admitting: Surgery

## 2015-12-27 ENCOUNTER — Encounter (HOSPITAL_BASED_OUTPATIENT_CLINIC_OR_DEPARTMENT_OTHER): Payer: Self-pay | Admitting: Anesthesiology

## 2015-12-27 ENCOUNTER — Ambulatory Visit (HOSPITAL_COMMUNITY)
Admission: RE | Admit: 2015-12-27 | Discharge: 2015-12-27 | Disposition: A | Discharge status: Home / Self Care | Payer: PPO | Source: Ambulatory Visit | Attending: Surgery | Admitting: Surgery

## 2015-12-27 DIAGNOSIS — Z79890 Hormone replacement therapy (postmenopausal): Secondary | ICD-10-CM | POA: Insufficient documentation

## 2015-12-27 DIAGNOSIS — C50912 Malignant neoplasm of unspecified site of left female breast: Secondary | ICD-10-CM

## 2015-12-27 DIAGNOSIS — Z79899 Other long term (current) drug therapy: Secondary | ICD-10-CM | POA: Insufficient documentation

## 2015-12-27 DIAGNOSIS — I10 Essential (primary) hypertension: Secondary | ICD-10-CM | POA: Insufficient documentation

## 2015-12-27 DIAGNOSIS — C50412 Malignant neoplasm of upper-outer quadrant of left female breast: Secondary | ICD-10-CM | POA: Insufficient documentation

## 2015-12-27 DIAGNOSIS — Z803 Family history of malignant neoplasm of breast: Secondary | ICD-10-CM | POA: Diagnosis not present

## 2015-12-27 DIAGNOSIS — K219 Gastro-esophageal reflux disease without esophagitis: Secondary | ICD-10-CM | POA: Diagnosis not present

## 2015-12-27 DIAGNOSIS — Z7982 Long term (current) use of aspirin: Secondary | ICD-10-CM | POA: Insufficient documentation

## 2015-12-27 DIAGNOSIS — M797 Fibromyalgia: Secondary | ICD-10-CM | POA: Insufficient documentation

## 2015-12-27 HISTORY — PX: AXILLARY LYMPH NODE BIOPSY: SHX5737

## 2015-12-27 SURGERY — AXILLARY LYMPH NODE BIOPSY
Anesthesia: General | Site: Axilla | Laterality: Left

## 2015-12-27 MED ORDER — MIDAZOLAM HCL 2 MG/2ML IJ SOLN
INTRAMUSCULAR | Status: AC
  Filled 2015-12-27: qty 2

## 2015-12-27 MED ORDER — CEFAZOLIN SODIUM-DEXTROSE 2-3 GM-% IV SOLR
INTRAVENOUS | Status: AC
  Filled 2015-12-27: qty 50

## 2015-12-27 MED ORDER — MIDAZOLAM HCL 2 MG/2ML IJ SOLN
1.0000 mg | INTRAMUSCULAR | Status: DC | PRN
  Administered 2015-12-27 (×2): 2 mg via INTRAVENOUS

## 2015-12-27 MED ORDER — LIDOCAINE HCL (CARDIAC) 20 MG/ML IV SOLN
INTRAVENOUS | Status: DC | PRN
  Administered 2015-12-27: 60 mg via INTRAVENOUS

## 2015-12-27 MED ORDER — LACTATED RINGERS IV SOLN: INTRAVENOUS | Status: DC

## 2015-12-27 MED ORDER — DEXAMETHASONE SODIUM PHOSPHATE 10 MG/ML IJ SOLN
INTRAMUSCULAR | Status: AC
  Filled 2015-12-27: qty 1

## 2015-12-27 MED ORDER — FENTANYL CITRATE (PF) 100 MCG/2ML IJ SOLN
INTRAMUSCULAR | Status: AC
  Filled 2015-12-27: qty 2

## 2015-12-27 MED ORDER — FENTANYL CITRATE (PF) 100 MCG/2ML IJ SOLN
50.0000 ug | INTRAMUSCULAR | Status: AC | PRN
  Administered 2015-12-27 (×3): 50 ug via INTRAVENOUS
  Administered 2015-12-27: 5 ug via INTRAVENOUS

## 2015-12-27 MED ORDER — PROPOFOL 10 MG/ML IV BOLUS
INTRAVENOUS | Status: AC
  Filled 2015-12-27: qty 20

## 2015-12-27 MED ORDER — DEXAMETHASONE SODIUM PHOSPHATE 4 MG/ML IJ SOLN
INTRAMUSCULAR | Status: DC | PRN
  Administered 2015-12-27: 10 mg via INTRAVENOUS

## 2015-12-27 MED ORDER — ONDANSETRON HCL 4 MG/2ML IJ SOLN
INTRAMUSCULAR | Status: DC | PRN
  Administered 2015-12-27: 4 mg via INTRAVENOUS

## 2015-12-27 MED ORDER — ONDANSETRON HCL 4 MG/2ML IJ SOLN
INTRAMUSCULAR | Status: AC
  Filled 2015-12-27: qty 2

## 2015-12-27 MED ORDER — BUPIVACAINE HCL (PF) 0.25 % IJ SOLN
INTRAMUSCULAR | Status: DC | PRN
  Administered 2015-12-27: 30 mL

## 2015-12-27 MED ORDER — SCOPOLAMINE 1 MG/3DAYS TD PT72: 1.0000 | MEDICATED_PATCH | Freq: Once | TRANSDERMAL | Status: DC | PRN

## 2015-12-27 MED ORDER — PROPOFOL 10 MG/ML IV BOLUS
INTRAVENOUS | Status: DC | PRN
  Administered 2015-12-27: 170 mg via INTRAVENOUS

## 2015-12-27 MED ORDER — CHLORHEXIDINE GLUCONATE 4 % EX LIQD: 1.0000 "application " | Freq: Once | CUTANEOUS | Status: DC

## 2015-12-27 MED ORDER — CEFAZOLIN SODIUM-DEXTROSE 2-3 GM-% IV SOLR
2.0000 g | INTRAVENOUS | Status: AC
  Administered 2015-12-27: 2 g via INTRAVENOUS

## 2015-12-27 MED ORDER — LACTATED RINGERS IV SOLN
INTRAVENOUS | Status: DC
  Administered 2015-12-27 (×2): via INTRAVENOUS

## 2015-12-27 MED ORDER — FENTANYL CITRATE (PF) 100 MCG/2ML IJ SOLN: 25.0000 ug | INTRAMUSCULAR | Status: DC | PRN

## 2015-12-27 MED ORDER — PHENYLEPHRINE HCL 10 MG/ML IJ SOLN
INTRAMUSCULAR | Status: DC | PRN
  Administered 2015-12-27 (×3): 80 ug via INTRAVENOUS

## 2015-12-27 MED ORDER — GLYCOPYRROLATE 0.2 MG/ML IJ SOLN
INTRAMUSCULAR | Status: AC
  Filled 2015-12-27: qty 1

## 2015-12-27 MED ORDER — BUPIVACAINE HCL (PF) 0.25 % IJ SOLN
INTRAMUSCULAR | Status: AC
  Filled 2015-12-27: qty 30

## 2015-12-27 MED ORDER — TECHNETIUM TC 99M SULFUR COLLOID FILTERED
1.0000 | Freq: Once | INTRAVENOUS | Status: AC | PRN
Start: 2015-12-27 — End: 2015-12-27
  Administered 2015-12-27: 1 via INTRADERMAL

## 2015-12-27 MED ORDER — METHYLENE BLUE 1 % INJ SOLN
INTRAMUSCULAR | Status: DC | PRN
  Administered 2015-12-27: 4 mL via INTRAMUSCULAR

## 2015-12-27 MED ORDER — GLYCOPYRROLATE 0.2 MG/ML IJ SOLN
0.2000 mg | Freq: Once | INTRAMUSCULAR | Status: AC | PRN
  Administered 2015-12-27: 0.2 mg via INTRAVENOUS

## 2015-12-27 SURGICAL SUPPLY — 52 items
APPLIER CLIP 11 MED OPEN (CLIP)
BENZOIN TINCTURE PRP APPL 2/3 (GAUZE/BANDAGES/DRESSINGS) IMPLANT
BLADE SURG 10 STRL SS (BLADE) ×2 IMPLANT
BLADE SURG 15 STRL LF DISP TIS (BLADE) ×1 IMPLANT
BLADE SURG 15 STRL SS (BLADE) ×1
CANISTER SUCT 1200ML W/VALVE (MISCELLANEOUS) ×2 IMPLANT
CHLORAPREP W/TINT 26ML (MISCELLANEOUS) ×2 IMPLANT
CLIP APPLIE 11 MED OPEN (CLIP) IMPLANT
CLIP TI MEDIUM 6 (CLIP) IMPLANT
CLIP TI WIDE RED SMALL 6 (CLIP) IMPLANT
COVER BACK TABLE 60X90IN (DRAPES) ×2 IMPLANT
COVER MAYO STAND STRL (DRAPES) ×2 IMPLANT
DECANTER SPIKE VIAL GLASS SM (MISCELLANEOUS) IMPLANT
DRAIN CHANNEL 19F RND (DRAIN) IMPLANT
DRAIN HEMOVAC 1/8 X 5 (WOUND CARE) IMPLANT
DRAPE LAPAROTOMY T 102X78X121 (DRAPES) ×2 IMPLANT
DRAPE UTILITY XL STRL (DRAPES) ×2 IMPLANT
ELECT COATED BLADE 2.86 ST (ELECTRODE) ×2 IMPLANT
ELECT REM PT RETURN 9FT ADLT (ELECTROSURGICAL) ×2
ELECTRODE REM PT RTRN 9FT ADLT (ELECTROSURGICAL) ×1 IMPLANT
EVACUATOR SILICONE 100CC (DRAIN) IMPLANT
GAUZE SPONGE 4X4 16PLY XRAY LF (GAUZE/BANDAGES/DRESSINGS) IMPLANT
GLOVE BIO SURGEON STRL SZ 6.5 (GLOVE) ×2 IMPLANT
GLOVE BIOGEL PI IND STRL 7.0 (GLOVE) ×2 IMPLANT
GLOVE BIOGEL PI INDICATOR 7.0 (GLOVE) ×2
GLOVE SURG SIGNA 7.5 PF LTX (GLOVE) ×2 IMPLANT
GOWN STRL REUS W/ TWL LRG LVL3 (GOWN DISPOSABLE) ×2 IMPLANT
GOWN STRL REUS W/TWL LRG LVL3 (GOWN DISPOSABLE) ×2
LIQUID BAND (GAUZE/BANDAGES/DRESSINGS) ×2 IMPLANT
NEEDLE HYPO 25X1 1.5 SAFETY (NEEDLE) ×2 IMPLANT
NS IRRIG 1000ML POUR BTL (IV SOLUTION) IMPLANT
PACK BASIN DAY SURGERY FS (CUSTOM PROCEDURE TRAY) ×2 IMPLANT
PENCIL BUTTON HOLSTER BLD 10FT (ELECTRODE) ×2 IMPLANT
PIN SAFETY STERILE (MISCELLANEOUS) IMPLANT
SHEET MEDIUM DRAPE 40X70 STRL (DRAPES) ×2 IMPLANT
SLEEVE SCD COMPRESS KNEE MED (MISCELLANEOUS) ×2 IMPLANT
SPONGE GAUZE 4X4 12PLY STER LF (GAUZE/BANDAGES/DRESSINGS) IMPLANT
SPONGE LAP 18X18 X RAY DECT (DISPOSABLE) IMPLANT
STRIP CLOSURE SKIN 1/2X4 (GAUZE/BANDAGES/DRESSINGS) IMPLANT
SUT ETHILON 2 0 FS 18 (SUTURE) IMPLANT
SUT SILK 3 0 TIES 17X18 (SUTURE)
SUT SILK 3-0 18XBRD TIE BLK (SUTURE) IMPLANT
SUT VIC AB 5-0 PS2 18 (SUTURE) ×2 IMPLANT
SUT VICRYL 3-0 CR8 SH (SUTURE) ×2 IMPLANT
SUT VICRYL AB 2 0 TIE (SUTURE) IMPLANT
SUT VICRYL AB 2 0 TIES (SUTURE)
SYR BULB 3OZ (MISCELLANEOUS) ×2 IMPLANT
SYR CONTROL 10ML LL (SYRINGE) ×2 IMPLANT
TOWEL OR 17X24 6PK STRL BLUE (TOWEL DISPOSABLE) ×2 IMPLANT
TOWEL OR NON WOVEN STRL DISP B (DISPOSABLE) ×2 IMPLANT
TUBE CONNECTING 20X1/4 (TUBING) ×2 IMPLANT
YANKAUER SUCT BULB TIP NO VENT (SUCTIONS) ×2 IMPLANT

## 2015-12-27 NOTE — Interval H&P Note (Signed)
History and Physical Interval Note:  12/27/2015 9:11 AM  Audrey [name]  has presented today for surgery, with the diagnosis of Left breast cancer  The various methods of treatment have been discussed with the patient and family.  Her husband is with her.  After consideration of risks, benefits and other options for treatment, the patient has consented to  Procedure(s): LEFT AXILLARY SENTINEL LYMPH NODE BIOPSY (Left) as a surgical intervention .  The patient's history has been reviewed, patient examined, no change in status, stable for surgery.  I have reviewed the patient's chart and labs.  Questions were answered to the patient's satisfaction.     Sheniah Supak H

## 2015-12-27 NOTE — Discharge Instructions (Signed)
[facility] - DISCHARGE INSTRUCTIONS TO PATIENT  Activity:  Driving - May drive in 2 or 3 days, if doing well   Lifting - Take it easy for one week.  Wound Care:   Leave bandage for 2 days, then remove and shower.  Diet:  As Tolerated  Follow up appointment:  Call Dr. Pollie Friar office [facility]) at [phone] for an appointment in 2 to 3 weeks.  Medications and dosages:  Resume your home medications.   Call Dr. Lucia Gaskins or his office  [phone]) if you have:  Temperature greater than 100.4,  Persistent nausea and vomiting,  Severe uncontrolled pain,  Redness, tenderness, or signs of infection (pain, swelling, redness, odor or green/yellow discharge around the site),  Difficulty breathing, headache or visual disturbances,  Any other questions or concerns you may have after discharge.  In an emergency, call 911 or go to an Emergency Department at a nearby hospital.    Post Anesthesia Home Care Instructions  Activity: Get plenty of rest for the remainder of the day. A responsible adult should stay with you for 24 hours following the procedure.  For the next 24 hours, DO NOT: -Drive a car -Paediatric nurse -Drink alcoholic beverages -Take any medication unless instructed by your physician -Make any legal decisions or sign important papers.  Meals: Start with liquid foods such as gelatin or soup. Progress to regular foods as tolerated. Avoid greasy, spicy, heavy foods. If nausea and/or vomiting occur, drink only clear liquids until the nausea and/or vomiting subsides. Call your physician if vomiting continues.  Special Instructions/Symptoms: Your throat may feel dry or sore from the anesthesia or the breathing tube placed in your throat during surgery. If this causes discomfort, gargle with warm salt water. The discomfort should disappear within 24 hours.  If you had a scopolamine patch placed behind your ear for the management of post- operative  nausea and/or vomiting:  1. The medication in the patch is effective for 72 hours, after which it should be removed.  Wrap patch in a tissue and discard in the trash. Wash hands thoroughly with soap and water. 2. You may remove the patch earlier than 72 hours if you experience unpleasant side effects which may include dry mouth, dizziness or visual disturbances. 3. Avoid touching the patch. Wash your hands with soap and water after contact with the patch.

## 2015-12-27 NOTE — Transfer of Care (Signed)
Immediate Anesthesia Transfer of Care Note  Patient: Audrey [name]  Procedure(s) Performed: Procedure(s): LEFT AXILLARY SENTINEL LYMPH NODE BIOPSY (Left)  Patient Location: PACU  Anesthesia Type:General  Level of Consciousness: sedated and patient cooperative  Airway & Oxygen Therapy: Patient Spontanous Breathing and Patient connected to face mask oxygen  Post-op Assessment: Report given to RN and Post -op Vital signs reviewed and stable  Post vital signs: Reviewed and stable  Last Vitals:  Filed Vitals:   12/27/15 0845 12/27/15 0855  BP: 140/76   Pulse: 78 75  Temp:    Resp: 18 16    Complications: No apparent anesthesia complications

## 2015-12-27 NOTE — Anesthesia Procedure Notes (Signed)
Procedure Name: LMA Insertion Date/Time: 12/27/2015 9:27 AM Performed by: Lyndee Leo Pre-anesthesia Checklist: Patient identified, Emergency Drugs available, Suction available and Patient being monitored Patient Re-evaluated:Patient Re-evaluated prior to inductionOxygen Delivery Method: Circle System Utilized Preoxygenation: Pre-oxygenation with 100% oxygen Intubation Type: IV induction Ventilation: Mask ventilation without difficulty LMA: LMA inserted LMA Size: 4.0 Number of attempts: 1 Airway Equipment and Method: Bite block Placement Confirmation: positive ETCO2 Tube secured with: Tape Dental Injury: Teeth and Oropharynx as per pre-operative assessment

## 2015-12-27 NOTE — Op Note (Signed)
12/27/2015  10:38 AM  PATIENT:  Audrey [name] DOB: 1941-11-06 MRN: [id number]  PREOP DIAGNOSIS:  Left breast cancer  POSTOP DIAGNOSIS:   Left breast cancer, 2 o'clock position (T1, N0)  PROCEDURE:   Procedure(s):  LEFT AXILLARY SENTINEL LYMPH NODE BIOPSY, Injection of peri areolar area of breast with methylene blue (1.0 cc)  SURGEON:   [name], M.D.  ANESTHESIA:   general  Anesthesiologist: Effie Berkshire, MD CRNA: Lyndee Leo, CRNA  General  EBL:  minimal  ml  DRAINS: none   LOCAL MEDICATIONS USED:   30 cc 1/4% marcaine  SPECIMEN:   Left axillary fat pad, left axillary lymph node (cold and not blue)  COUNTS CORRECT:  YES  INDICATIONS FOR PROCEDURE:  Audrey [name] is a 75 y.o. (DOB: [birth year]) white  female whose primary care physician is [name], MD and comes for left axillary sentinel lymph node biopsy.   She underwent a left breast biopsy on 12/01/2015 for a 1.2 cm IDC in the upper outer quadrant of her right breast.  She has seen Dr. Burr Medico and Dr. Lisbeth Renshaw for oncology.  She now comes for a left axillary SLNBx.   The options for breast cancer treatment have been discussed with the patient. She elected to proceed with lumpectomy and axillary sentinel lymph node.     The indications and potential complications of surgery were explained to the patient. Potential complications include, but are not limited to, bleeding, infection, the need for further surgery, and nerve injury.     In the holding area, her left areola was injected with 1 millicurie of Technitium Sulfur Colloid.  OPERATIVE NOTE:   The patient was taken to room # 6 at [facility] where she underwent a general anesthesia  supervised by Anesthesiologist: Effie Berkshire, MD CRNA: Lyndee Leo, CRNA. Her left breast and axilla were prepped with  ChloraPrep and sterilely draped.    A time-out and the surgical check list was reviewed.    I injected about 0.5 mL of 40% methylene blue around  her left areola.   She has already had a "lumpectomy", so today was for the left axillary sentinel lymph node biopsy.  Of note, her prior biopsy was in the UOQ of the left breast and I think this proved to "block" the circumareolar injections to find the left axillary node.    I started the left axillary sentinel lymph node biopsy. I made an incision in the left axilla.  I did not find a definite "hot" area, though the biopsy site seroma was "warm".  I cut down along the pectoralis major and  identified a node that was slightly enlarged and I think represented where the sentinel lymph node would be.  It had counts of 0 and the background has 0 counts. The lymph node was not blue. I checked her internal mammary nodes and supraclavicular nodes with the neoprobe and found no other hot area. The axillary node was then sent to pathology.    I then irrigated the wound with saline. I infiltrated approximately 30 mL of 1% local in the incisions.  I then closed the wound in layers using 3-0 Vicryl sutures for the deep layer. At the skin, I closed the incisions with a 5-0 Monocryl suture. The incisions were then painted with LiquiBand.  She had gauze place over the wounds and then the dressing taped in place.   The patient tolerated the procedure well, was transported to the recovery  room in good condition. Sponge and needle count were correct at the end of the case.   Final pathology is pending.   [name], MD, [facility] Pager: [phone] Office phone:  [phone]

## 2015-12-27 NOTE — Anesthesia Postprocedure Evaluation (Signed)
Anesthesia Post Note  Patient: Audrey [name]  Procedure(s) Performed: Procedure(s) (LRB): LEFT AXILLARY SENTINEL LYMPH NODE BIOPSY (Left)  Patient location during evaluation: PACU Anesthesia Type: General Level of consciousness: awake and alert Pain management: pain level controlled Vital Signs Assessment: post-procedure vital signs reviewed and stable Respiratory status: spontaneous breathing, nonlabored ventilation, respiratory function stable and patient connected to nasal cannula oxygen Cardiovascular status: blood pressure returned to baseline and stable Postop Assessment: no signs of nausea or vomiting Anesthetic complications: no    Last Vitals:  Filed Vitals:   12/27/15 1115 12/27/15 1215  BP: 122/76 149/76  Pulse: 73 64  Temp:  36.4 C  Resp: 26 20    Last Pain:  Filed Vitals:   12/27/15 1222  PainSc: Fife Lashana Spang

## 2015-12-27 NOTE — Anesthesia Preprocedure Evaluation (Addendum)
Anesthesia Evaluation  Patient identified by MRN, date of birth, ID band Patient awake    History of Anesthesia Complications (+) PONV and history of anesthetic complications  Airway Mallampati: II  TM Distance: >3 FB Neck ROM: Full    Dental  (+) Teeth Intact   Pulmonary    breath sounds clear to auscultation       Cardiovascular hypertension, Pt. on medications and Pt. on home beta blockers + dysrhythmias  Rhythm:Regular Rate:Normal     Neuro/Psych  Neuromuscular disease negative psych ROS   GI/Hepatic Neg liver ROS, GERD  Medicated,  Endo/Other  negative endocrine ROS  Renal/GU negative Renal ROS  negative genitourinary   Musculoskeletal  (+) Arthritis , Fibromyalgia -  Abdominal   Peds negative pediatric ROS (+)  Hematology negative hematology ROS (+)   Anesthesia Other Findings   Reproductive/Obstetrics negative OB ROS                            Lab Results  Component Value Date   WBC 9.0 11/25/2015   HGB 14.5 11/25/2015   HCT 41.8 11/25/2015   MCV 86.9 11/25/2015   PLT 384 11/25/2015   No results found for: INR, PROTIME  02/2015 EKG: normal sinus rhythm.   Anesthesia Physical Anesthesia Plan  ASA: III  Anesthesia Plan: General   Post-op Pain Management:    Induction: Intravenous  Airway Management Planned: LMA  Additional Equipment:   Intra-op Plan:   Post-operative Plan: Extubation in OR  Informed Consent: I have reviewed the patients History and Physical, chart, labs and discussed the procedure including the risks, benefits and alternatives for the proposed anesthesia with the patient or authorized representative who has indicated his/her understanding and acceptance.   Dental advisory given  Plan Discussed with: CRNA  Anesthesia Plan Comments:         Anesthesia Quick Evaluation

## 2015-12-28 ENCOUNTER — Encounter (HOSPITAL_BASED_OUTPATIENT_CLINIC_OR_DEPARTMENT_OTHER): Payer: Self-pay | Admitting: Surgery

## 2015-12-28 NOTE — Addendum Note (Signed)
Addendum  created 12/28/15 1336 by Willa Frater, CRNA   Modules edited: Charges VN

## 2015-12-29 ENCOUNTER — Telehealth: Payer: Self-pay | Admitting: *Deleted

## 2015-12-29 NOTE — Telephone Encounter (Signed)
Received order per Dr. Feng for oncotype testing. Requisition sent to pathology. Received by Christy. 

## 2016-01-02 ENCOUNTER — Encounter: Payer: Self-pay | Admitting: *Deleted

## 2016-01-05 DIAGNOSIS — C50412 Malignant neoplasm of upper-outer quadrant of left female breast: Secondary | ICD-10-CM | POA: Diagnosis not present

## 2016-01-06 ENCOUNTER — Encounter (HOSPITAL_COMMUNITY): Payer: Self-pay

## 2016-01-07 ENCOUNTER — Other Ambulatory Visit: Payer: Self-pay | Admitting: Hematology

## 2016-01-09 ENCOUNTER — Telehealth: Payer: Self-pay | Admitting: Hematology

## 2016-01-09 NOTE — Telephone Encounter (Signed)
per pof to sch pt appt-cld & spoke to pt and gave pt time & date of appt °

## 2016-01-13 ENCOUNTER — Telehealth: Payer: Self-pay | Admitting: Hematology

## 2016-01-13 ENCOUNTER — Ambulatory Visit (HOSPITAL_BASED_OUTPATIENT_CLINIC_OR_DEPARTMENT_OTHER): Payer: PPO | Admitting: Hematology

## 2016-01-13 VITALS — BP 134/75 | HR 72 | Temp 97.5°F | Resp 18 | Ht 59.0 in | Wt 175.0 lb

## 2016-01-13 DIAGNOSIS — C50412 Malignant neoplasm of upper-outer quadrant of left female breast: Secondary | ICD-10-CM | POA: Diagnosis not present

## 2016-01-13 DIAGNOSIS — I10 Essential (primary) hypertension: Secondary | ICD-10-CM

## 2016-01-13 DIAGNOSIS — M1990 Unspecified osteoarthritis, unspecified site: Secondary | ICD-10-CM | POA: Diagnosis not present

## 2016-01-13 NOTE — Progress Notes (Signed)
[place]  [phone] [phone]  Clinic follow Up Note   Patient Care Team: Audrey Gravel, MD as PCP - General (Internal Medicine) Audrey Margarita, MD as Consulting Physician (Cardiology) Audrey Marble, MD as Consulting Physician (Otolaryngology) Audrey Overall, MD as Consulting Physician (General [name]) Audrey Merle, MD as Consulting Physician (Hematology) Audrey Rudd, MD as Consulting Physician (Radiation Oncology) 01/13/2016   CHIEF COMPLAINTS:  Follow up left breast cancer   Oncology History   Breast cancer of upper-outer quadrant of left female breast Audrey [name])   Staging form: Breast, AJCC 7th Edition     Pathologic stage from 11/29/2015: Stage IA (T1c, N0, cM0) - Signed by Audrey Merle, MD on 01/14/2016     Clinical: Stage IA (T1c, N0, M0) - Unsigned        Breast cancer of upper-outer quadrant of left female breast ([place])   10/25/2015 Initial Biopsy Left breast core needle biopsy showed atypical ductal Hyperplasia   12/01/2015 Receptors her2 ER 90% positive, PR 20% positive, HER-2 negative, Ki-67 5%   12/01/2015 Pathology Results Left breast lumpectomy showed invasive ductal carcinoma, 1.2 cm, grade 2, and DCIS with necrosis and calcification, margins were negative.   12/01/2015 [name] Left breast lumpectomy, no sentinel lymph node biopsy.   12/01/2015 Initial Diagnosis Breast cancer of upper-outer quadrant of left female breast ([place])   12/01/2015 Oncotype testing RS 7, which predicts 10- year distant recurrence risk of 6% with tamoxifen alone.   12/27/2015 Pathology Results Second [name] with sentinel lymph node biopsy showed no malignant cells in 5 lymph nodes    HISTORY OF PRESENTING ILLNESS:  Audrey [name] 75 y.o. female is here because of her recently diagnosed left breast cancer.  This was dicovered by screening mammogram on 10/13/2015, which showed a 1.3 cm irregular focal asymmetry in the 1:00 position of left breast. She underwent diagnostic  mammogram and ultrasound and core needle biopsy, which showed atypical ductal hyperplasia. She was referred to breast surgeon Dr. Lucia [name]. She underwent left breast lumpectomy on 11/30/2014, no sentinel lymph nodes. 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  She has recovered very well, she took a few tablets of Tylenol after [name], the pain has resolved. She is back to her normal exercise routine without any issues.  She has arthritis, especially knee, shoudler and hands, takes Aleve evarage once a day. She is fairly active, exercise daily at home, including yaga and treadmill. She has good appetite and eating well, no recent weight change. No other complaints.   GYN HISTORY  Menarchal: 13 LMP: 1991 Contraceptive: no  HRT: no  G2P2: (+) breast feeding   INTERIM HISTORY Audrey [name] turns for follow-up. She underwent this second [name] with sentinel lymph node biopsy, and tardive very well. She has recovered well, with minimal pain as a surgical site, occasional numbness in the left shoulder and upper arm, the left shoulder has no limitation of the range of motion, no other complaints.   MEDICAL HISTORY:  Past Medical History  Diagnosis Date  . 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 Obesity   . Reflux     buring and dysphagia sx resp to PPI; egd 3/06 neg for Barretts  . Hypertension   . Diastolic dysfunction   . 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 Complication of anesthesia     difficulty awakening   . PONV (postoperative nausea and vomiting)   .  Bladder infection     hx of last one approx 5 years ago   . Yeast infection     hx of   . GERD (gastroesophageal reflux disease)   . Arthritis   . Cancer [facility])     breast  . Breast cancer ([place]) 10/25/15    left breast  ADH   . Allergy     SURGICAL HISTORY: Past Surgical History  Procedure Laterality Date  . Colonoscopy  3/06    NEG  .  Lumbar laminectomy    . Belpharoptosis repair Bilateral   . Eye [name]      cosmetic; eyelid [name] bilat   . Left rotator cuff repair      12/2006  . Finger [name]      left index finger/ cyst 02/2007  . Cortisone injections      right arm; back  . Knee arthroscopy Right 04/13/2015    Procedure: ARTHROSCOPY RIGHT KNEE WITH MEDIAL MENISCAL DEBRIDEMENT AND CHONDROPLASTY;  Surgeon: Gaynelle Arabian, MD;  Location: [facility];  Service: Orthopedics;  Laterality: Right;  . Tonsillectomy    . Breast lumpectomy with radioactive seed localization Left 12/01/2015    Procedure: LEFT BREAST [place] RADIOACTIVE SEED LOCALIZATION;  Surgeon: Audrey Overall, MD;  Location: [place];  Service: General;  Laterality: Left;  . Axillary lymph node biopsy Left 12/27/2015    Procedure: LEFT AXILLARY SENTINEL LYMPH NODE BIOPSY;  Surgeon: Audrey Overall, MD;  Location: [place];  Service: General;  Laterality: Left;    SOCIAL HISTORY: Social History   Social History  . Marital Status: Married    Spouse Name: N/A  . Number of Children: 2, daughter 31, son 20  . Years of Education: 13   Occupational History  . retired    Social History Main Topics  . Smoking status: Never Smoker   . Smokeless tobacco: Never Used  . Alcohol Use: No  . Drug Use: No  . Sexual Activity: Not on file   Other Topics Concern  . Not on file   Social History Narrative   Patient is right handed.   Patient drinks 2-3 cups of caffeine per day.    FAMILY HISTORY: Family History  Problem Relation Age of Onset  . Cancer Mother     breast  . Diabetes Mother   . Cancer Father     Hodgkin's disease  . Down syndrome Sister   . Cancer Sister 11    brast cancer (half sister)  . Alcohol abuse Brother   . Cancer Maternal Aunt     breast  . Cancer Maternal Aunt     ovarian ca  . Cancer Paternal Aunt 53    Breast cancer     ALLERGIES:  is allergic to cymbalta; other; and macrodantin.  MEDICATIONS:  Current  Outpatient Prescriptions  Medication Sig Dispense Refill  . aspirin 81 MG tablet Take 81 mg by mouth daily.    . Biotin 10 MG TABS Take 1 tablet by mouth daily.    . Calcium Carb-Cholecalciferol (CALCIUM + D3) 600-200 MG-UNIT TABS Take 1 tablet by mouth daily.     . calcium carbonate (TUMS - DOSED IN MG ELEMENTAL CALCIUM) 500 MG chewable tablet Chew 1 tablet by mouth daily as needed for indigestion.     . cholecalciferol (VITAMIN D) 1000 UNITS tablet Take 2,000 Units by mouth daily.    Marland Kitchen Co-Enzyme Q10 200 MG CAPS Take 1 capsule by mouth daily.     . Cranberry  200 MG CAPS Take 1 capsule by mouth daily.     . Cyanocobalamin (VITAMIN B-12) 5000 MCG SUBL Place 1 tablet under the tongue daily.    . Esomeprazole Magnesium (NEXIUM PO) Take 1 capsule by mouth daily.    Marland Kitchen gabapentin (NEURONTIN) 300 MG capsule One capsule twice during the day and 2 at night 360 capsule 2  . hydrochlorothiazide (MICROZIDE) 12.5 MG capsule Take 1 capsule (12.5 mg total) by mouth daily. 90 capsule 1  . loratadine (CLARITIN) 10 MG tablet Take 10 mg by mouth daily as needed.     Marland Kitchen losartan (COZAAR) 50 MG tablet Take 50 mg by mouth every morning.     . Multiple Vitamins-Minerals (PRESERVISION AREDS 2) CAPS Take 1 tablet by mouth 2 (two) times daily.     . nebivolol (BYSTOLIC) 5 MG tablet Take 5 mg by mouth daily.    . rosuvastatin (CRESTOR) 10 MG tablet Take 10 mg by mouth 2 (two) times a week.     No current facility-administered medications for this visit.    REVIEW OF SYSTEMS:   Constitutional: Denies fevers, chills or abnormal night sweats Eyes: Denies blurriness of vision, double vision or watery eyes Ears, nose, mouth, throat, and face: Denies mucositis or sore throat Respiratory: Denies cough, dyspnea or wheezes Cardiovascular: Denies palpitation, chest discomfort or lower extremity swelling Gastrointestinal:  Denies nausea, heartburn or change in bowel habits Skin: Denies abnormal skin rashes Lymphatics: Denies  new lymphadenopathy or easy bruising Neurological:Denies numbness, tingling or new weaknesses Behavioral/Psych: Mood is stable, no new changes  All other systems were reviewed with the patient and are negative.  PHYSICAL EXAMINATION: ECOG PERFORMANCE STATUS: 0 - Asymptomatic  Filed Vitals:   01/13/16 1518  BP: 134/75  Pulse: 72  Temp: 97.5 F (36.4 C)  Resp: 18   Filed Weights   01/13/16 1518  Weight: 175 lb (79.379 kg)    GENERAL:alert, no distress and comfortable SKIN: skin color, texture, turgor are normal, no rashes or significant lesions EYES: normal, conjunctiva are pink and non-injected, sclera clear OROPHARYNX:no exudate, no erythema and lips, buccal mucosa, and tongue normal  NECK: supple, thyroid normal size, non-tender, without nodularity LYMPH:  no palpable lymphadenopathy in the cervical, axillary or inguinal LUNGS: clear to auscultation and percussion with normal breathing effort HEART: regular rate & rhythm and no murmurs and no lower extremity edema ABDOMEN:abdomen soft, non-tender and normal bowel sounds Musculoskeletal:no cyanosis of digits and no clubbing  PSYCH: alert & oriented x 3 with fluent speech NEURO: no focal motor/sensory deficits  LABORATORY DATA:  I have reviewed the data as listed Lab Results  Component Value Date   WBC 9.0 11/25/2015   HGB 14.5 11/25/2015   HCT 41.8 11/25/2015   MCV 86.9 11/25/2015   PLT 384 11/25/2015    Recent Labs  02/02/15 1556 11/25/15 1412 12/23/15 1500  NA  --  139 140  K  --  3.4* 3.8  CL  --  101 99*  CO2  --  26 29  GLUCOSE  --  136* 119*  BUN  --  8 10  CREATININE  --  0.68 0.83  CALCIUM  --  9.6 9.5  GFRNONAA  --  >60 >60  GFRAA  --  >60 >60  PROT 7.1  --   --    PATHOLOGY REPORT:  Diagnosis 10/25/2015 Breast, left, needle core biopsy - ATYPICAL DUCTAL HYPERPLASIA. Microscopic Comment The findings are called to [facility])  on 10/26/2015. Dr. Donato Heinz  has seen this case in consultation with agreement. (RH:kh 10/26/2015)   Diagnosis 12/01/2015 Breast, lumpectomy, left - INVASIVE DUCTAL CARCINOMA, SEE COMMENT. - INVASIVE TUMOR IS 8 MM FROM THE NEAREST MARGIN (SUPERIOR) - DUCTAL CARCINOMA IN SITU WITH NECROSIS AND CALCIFICATIONS. - IN SITU CARCINOMA IS 4 MM FROM THE NEAREST MARGIN (SUPERIOR) - PREVIOUS BIOPSY SITE TISSUE CHANGE. - SEE TUMOR SYNOPTIC TEMPLATE BELOW. Microscopic Comment BREAST, INVASIVE TUMOR, WITHOUT LYMPH NODES PRESENT Specimen, including laterality : Left breast without sentinel lymph node sampling Procedure: Lumpectomy Histologic type: Ductal Grade: 2 of 3 Tubule formation: 3 Nuclear pleomorphism: 2 Mitotic: 1 Tumor size (gross measurement): 1.2 cm Margins: Invasive, distance to closest margin: 8 mm In-situ, distance to closest margin: 4 mm If margin positive, focally or broadly: N/A Lymphovascular invasion: Absent Ductal carcinoma in situ: Present Grade: 2 of 3 Extensive intraductal component: Absent Lobular neoplasia: Absent Tumor focality: Unifocal Treatment effect: None If present, treatment effect in breast tissue, lymph nodes or both: N/A Extent of tumor: Skin: N/A Nipple: N/A Skeletal muscle: N/A Breast prognostic profile: Estrogen receptor: Pending and will be reported in an addendum Progesterone receptor: Pending and will be reported in an addendum. Her 2 neu: Pending and will be reported in an addendum Ki-67: Pending and will be reported in an addendum. Non-neoplastic breast: Previous biopsy site tissue change, fibrocystic changes and microcalcifications. TNM: pT1c, pNX, pMX Comments: Multiple recut sections were examined. The absence of the myoepithelial layer was confirmed with smooth muscle myosin heavy chain, Calponin and p63 immunostains (blocks 1A and 1C). (CR:kh 12-05-15)  Results: IMMUNOHISTOCHEMICAL AND MORPHOMETRIC ANALYSIS PERFORMED MANUALLY Estrogen Receptor: 90%, POSITIVE, STRONG  STAINING INTENSITY Progesterone Receptor: 20%, POSITIVE, STRONG STAINING INTENSITY Proliferation Marker Ki67: 5%  Results: HER2 - NEGATIVE RATIO OF HER2/CEP17 SIGNALS 1.33 AVERAGE HER2 COPY NUMBER PER CELL 2.85  Diagnosis 12/27/2015 1. Fatty tissue BENIGN FIBROADIPOSE TISSUE 2. Lymph nodes, regional resection, Left axiliary node FIVE BENIGN LYMPH NODES (0/5)  ONCOTYPE Dx RS 7, which predicts 10- year distant recurrence risk of 6% with tamoxifen alone.  RADIOGRAPHIC STUDIES: I have personally reviewed the radiological images as listed and agreed with the findings in the report. Nm Sentinel Node Inj-no Rpt (breast)  12/27/2015  CLINICAL DATA: left breast cancer Sulfur colloid was injected intradermally by the nuclear medicine technologist for breast cancer sentinel node localization.    ASSESSMENT & PLAN:  75 year old Caucasian female, with past medical history of hypertension, arthritis, presented with screening discovered left breast cancer.  1. 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  -I reviewed the Oncotype test result, the recurrence score is 7, which is a low risk, she would not benefit from adjuvant chemotherapy.  -Giving the strong ER/PR positivity, I recommend her to have a aromatase inhibitor as adjuvant endocrine therapy to reduce risk of cancer recurrence. She has moderate arthritis, I recommend exemestane. 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 She is willing to try endocrine therapy, but would like to stop if she experiences any significant side effects, especially side effects on her vision.  -She is scheduled to start Radiation soon.  -We discussed the breast cancer surveillance, including annual screening mammogram, self-exam, routine follow-up with lab. -I encouraged her to  continue healthy diet and exercise regularly.  2. HTN, arthritis,  -She will continue follow-up with her primary care physician, and  exercise  3. Genetics -She has strong family history of breast and ovarian cancer. -She had genetic testing done at [facility] and was negative for BRCA1, BRCA2, CHECK2, ATM, BARD1, MLH1, MSH2, MSH6, PALB2, PMS2, PTEN, RAD51C, RAD51D, STK11 and TP53 -She is very interested in genetic counseling, and would like to know if she needs additional genetic testing. She has a daughter, she is very concerned about her daughter's risk of breast cancer.  -I will refer her to genetic counselor in our cancer center   Plan -she will start RT soon, her simulation is scheduled for Feb 22nd -I will see her back at the end of her radiation -genetic referral    All questions were answered. The patient knows to call the clinic with any problems, questions or concerns. I spent 25 minutes counseling the patient face to face. The total time spent in the appointment was 30 minutes and more than 50% was on counseling.     Audrey Merle, MD 01/13/2016 4:01 PM

## 2016-01-13 NOTE — Telephone Encounter (Signed)
appts made and avs printed °

## 2016-01-14 ENCOUNTER — Encounter: Payer: Self-pay | Admitting: Hematology

## 2016-01-18 ENCOUNTER — Ambulatory Visit
Admission: RE | Admit: 2016-01-18 | Discharge: 2016-01-18 | Disposition: A | Discharge status: Home / Self Care | Payer: PPO | Source: Ambulatory Visit | Attending: Radiation Oncology | Admitting: Radiation Oncology

## 2016-01-18 DIAGNOSIS — C50412 Malignant neoplasm of upper-outer quadrant of left female breast: Secondary | ICD-10-CM | POA: Diagnosis not present

## 2016-01-18 DIAGNOSIS — Z510 Encounter for antineoplastic radiation therapy: Secondary | ICD-10-CM | POA: Diagnosis not present

## 2016-01-18 NOTE — Progress Notes (Signed)
  Radiation Oncology         [phone] ________________________________  Name: Audrey [name] MRN: [id number]  Date: 01/18/2016  DOB: December 23, 1940  Optical Surface Tracking Plan:  Since intensity modulated radiotherapy (IMRT) and 3D conformal radiation treatment methods are predicated on accurate and precise positioning for treatment, intrafraction motion monitoring is medically necessary to ensure accurate and safe treatment delivery.  The ability to quantify intrafraction motion without excessive ionizing radiation dose can only be performed with optical surface tracking. Accordingly, surface imaging offers the opportunity to obtain 3D measurements of patient position throughout IMRT and 3D treatments without excessive radiation exposure.  I am ordering optical surface tracking for this patient's upcoming course of radiotherapy. ________________________________  Kyung Rudd, MD 01/18/2016 8:52 PM    Reference:   Particia Jasper, et al. Surface imaging-based analysis of intrafraction motion for breast radiotherapy patients.Journal of [place], n. 6, nov. 2014. ISSN [id number].   Available at: <http://www.jacmp.org/index.php/jacmp/article/view/4957>.

## 2016-01-18 NOTE — Progress Notes (Signed)
  Radiation Oncology         [phone] ________________________________  Name: Audrey [name] MRN: [id number]  Date: 01/18/2016  DOB: October 27, 1941   DIAGNOSIS:     ICD-9-CM ICD-10-CM   1. Breast cancer of upper-outer quadrant of left female breast ([place]) 174.4 C50.412     SIMULATION AND TREATMENT PLANNING NOTE  The patient presented for simulation prior to beginning her course of radiation treatment for her diagnosis of left-sided breast cancer. The patient was placed in a supine position on a breast board. A customized vac-lock bag was constructed and this complex treatment device will be used on a daily basis during her treatment. In this fashion, a CT scan was obtained through the chest area and an isocenter was placed near the chest wall within the breast.  The patient will be planned to receive a course of radiation initially to a dose of 42.5 Gy. This will consist of a whole breast radiotherapy technique. To accomplish this, 2 customized blocks have been designed which will correspond to medial and lateral whole breast tangent fields. This treatment will be accomplished at 2.5 Gy per fraction. A forward planning technique will also be evaluated to determine if this approach improves the plan. It is anticipated that the patient will then receive a 7.5 Gy boost to the seroma cavity which has been contoured. This will be accomplished at 2.5 Gy per fraction.   This initial treatment will consist of a 3-D conformal technique. The seroma has been contoured as the primary target structure. Additionally, dose volume histograms of both this target as well as the lungs and heart will also be evaluated. Such an approach is necessary to ensure that the target area is adequately covered while the nearby critical  normal structures are adequately spared.  Plan:  The final anticipated total dose therefore will correspond to 50 Gy.   Special treatment procedure Special treatment procedure was  performed today due to the extra time and effort required by myself to plan and prepare this patient for deep inspiration breath hold technique.  I have determined cardiac sparing to be of benefit to this patient to prevent long term cardiac damage due to radiation of the heart.  Bellows were placed on the patient's abdomen. To facilitate cardiac sparing, the patient was coached by the radiation therapists on breath hold techniques and breathing practice was performed. Practice waveforms were obtained. The patient was then scanned while maintaining breath hold in the treatment position.  This image was then transferred over to the imaging specialist. The imaging specialist then created a fusion of the free breathing and breath hold scans using the chest wall as the stable structure. I personally reviewed the fusion in axial, coronal and sagittal image planes.  Excellent cardiac sparing was obtained.  I felt the patient is an appropriate candidate for breath hold and the patient will be treated as such.  The image fusion was then reviewed with the patient to reinforce the necessity of reproducible breath hold.      _______________________________   [name], MD, PhD

## 2016-01-20 DIAGNOSIS — C50412 Malignant neoplasm of upper-outer quadrant of left female breast: Secondary | ICD-10-CM | POA: Diagnosis not present

## 2016-01-20 DIAGNOSIS — Z510 Encounter for antineoplastic radiation therapy: Secondary | ICD-10-CM | POA: Diagnosis not present

## 2016-01-23 ENCOUNTER — Ambulatory Visit
Admission: RE | Admit: 2016-01-23 | Discharge: 2016-01-23 | Disposition: A | Discharge status: Home / Self Care | Payer: PPO | Source: Ambulatory Visit | Attending: Radiation Oncology | Admitting: Radiation Oncology

## 2016-01-23 DIAGNOSIS — C50412 Malignant neoplasm of upper-outer quadrant of left female breast: Secondary | ICD-10-CM

## 2016-01-23 DIAGNOSIS — Z510 Encounter for antineoplastic radiation therapy: Secondary | ICD-10-CM | POA: Diagnosis not present

## 2016-01-23 MED ORDER — RADIAPLEXRX EX GEL
Freq: Once | CUTANEOUS | Status: AC
  Administered 2016-01-23: 12:00:00 via TOPICAL

## 2016-01-23 MED ORDER — ALRA NON-METALLIC DEODORANT (RAD-ONC)
1.0000 "application " | Freq: Once | TOPICAL | Status: AC
  Administered 2016-01-23: 1 via TOPICAL

## 2016-01-23 NOTE — Progress Notes (Signed)
Pt education done, radiation therapy and you book, my business card, alra and radiapelx gel given to patent, discussed ways to manage side effects, fatigue,pain, breast swelling/soreness, skin irritation, increase protein in diet, stay hydrated,drink plenty water, sees MD weekly and prn, verbal understanding, teach back given

## 2016-01-24 ENCOUNTER — Ambulatory Visit
Admission: RE | Admit: 2016-01-24 | Discharge: 2016-01-24 | Disposition: A | Discharge status: Home / Self Care | Payer: PPO | Source: Ambulatory Visit | Attending: Radiation Oncology | Admitting: Radiation Oncology

## 2016-01-24 DIAGNOSIS — Z510 Encounter for antineoplastic radiation therapy: Secondary | ICD-10-CM | POA: Diagnosis not present

## 2016-01-24 DIAGNOSIS — C50412 Malignant neoplasm of upper-outer quadrant of left female breast: Secondary | ICD-10-CM | POA: Diagnosis not present

## 2016-01-25 ENCOUNTER — Ambulatory Visit
Admission: RE | Admit: 2016-01-25 | Discharge: 2016-01-25 | Disposition: A | Discharge status: Home / Self Care | Payer: PPO | Source: Ambulatory Visit | Attending: Radiation Oncology | Admitting: Radiation Oncology

## 2016-01-25 DIAGNOSIS — C50412 Malignant neoplasm of upper-outer quadrant of left female breast: Secondary | ICD-10-CM | POA: Diagnosis not present

## 2016-01-25 DIAGNOSIS — Z510 Encounter for antineoplastic radiation therapy: Secondary | ICD-10-CM | POA: Diagnosis not present

## 2016-01-26 ENCOUNTER — Ambulatory Visit
Admission: RE | Admit: 2016-01-26 | Discharge: 2016-01-26 | Disposition: A | Discharge status: Home / Self Care | Payer: PPO | Source: Ambulatory Visit | Attending: Radiation Oncology | Admitting: Radiation Oncology

## 2016-01-26 DIAGNOSIS — C50412 Malignant neoplasm of upper-outer quadrant of left female breast: Secondary | ICD-10-CM | POA: Diagnosis not present

## 2016-01-26 DIAGNOSIS — Z510 Encounter for antineoplastic radiation therapy: Secondary | ICD-10-CM | POA: Diagnosis not present

## 2016-01-27 ENCOUNTER — Telehealth: Payer: Self-pay | Admitting: Radiation Oncology

## 2016-01-27 ENCOUNTER — Encounter: Payer: Self-pay | Admitting: Radiation Oncology

## 2016-01-27 ENCOUNTER — Ambulatory Visit
Admission: RE | Admit: 2016-01-27 | Discharge: 2016-01-27 | Disposition: A | Discharge status: Home / Self Care | Payer: PPO | Source: Ambulatory Visit | Attending: Radiation Oncology | Admitting: Radiation Oncology

## 2016-01-27 VITALS — BP 155/70 | HR 70 | Temp 97.9°F | Resp 20 | Wt 173.6 lb

## 2016-01-27 DIAGNOSIS — C50412 Malignant neoplasm of upper-outer quadrant of left female breast: Secondary | ICD-10-CM

## 2016-01-27 DIAGNOSIS — Z510 Encounter for antineoplastic radiation therapy: Secondary | ICD-10-CM | POA: Diagnosis not present

## 2016-01-27 NOTE — Progress Notes (Signed)
Weekly rad txs left breast 5/20 completed, no skin changes, swelling under left axilla, no pain, using raiaiplex bid,  Patient  Appetite  Fair, energy level fair 2:08 PM BP 155/70 mmHg  Pulse 70  Temp(Src) 97.9 F (36.6 C) (Oral)  Resp 20  Wt 173 lb 9.6 oz (78.744 kg)  Wt Readings from Last 3 Encounters:  01/27/16 173 lb 9.6 oz (78.744 kg)  01/13/16 175 lb (79.379 kg)  12/27/15 170 lb 9.6 oz (77.384 kg)

## 2016-01-27 NOTE — Progress Notes (Signed)
Department of Radiation Oncology  Phone:  [phone] Fax:        [phone]  Weekly Treatment Note    Name: Audrey [name] Date: 01/27/2016 MRN: [id number] DOB: November 15, 1941   Diagnosis:     ICD-9-CM ICD-10-CM   1. Breast cancer of upper-outer quadrant of left female breast ([place]) 174.4 C50.412      Current dose: 12.5 Gy  Current fraction: 5   MEDICATIONS: Current Outpatient Prescriptions  Medication Sig Dispense Refill  . aspirin 81 MG tablet Take 81 mg by mouth daily.    . Biotin 10 MG TABS Take 1 tablet by mouth daily.    . Calcium Carb-Cholecalciferol (CALCIUM + D3) 600-200 MG-UNIT TABS Take 1 tablet by mouth daily.     . calcium carbonate (TUMS - DOSED IN MG ELEMENTAL CALCIUM) 500 MG chewable tablet Chew 1 tablet by mouth daily as needed for indigestion.     . cholecalciferol (VITAMIN D) 1000 UNITS tablet Take 2,000 Units by mouth daily.    Marland Kitchen Co-Enzyme Q10 200 MG CAPS Take 1 capsule by mouth daily.     . Cranberry 200 MG CAPS Take 1 capsule by mouth daily.     . Cyanocobalamin (VITAMIN B-12) 5000 MCG SUBL Place 1 tablet under the tongue daily.    . Esomeprazole Magnesium (NEXIUM PO) Take 1 capsule by mouth daily.    Marland Kitchen gabapentin (NEURONTIN) 300 MG capsule One capsule twice during the day and 2 at night 360 capsule 2  . hyaluronate sodium (RADIAPLEXRX) GEL Apply 1 application topically 2 (two) times daily.    . hydrochlorothiazide (MICROZIDE) 12.5 MG capsule Take 1 capsule (12.5 mg total) by mouth daily. 90 capsule 1  . loratadine (CLARITIN) 10 MG tablet Take 10 mg by mouth daily as needed.     Marland Kitchen losartan (COZAAR) 50 MG tablet Take 50 mg by mouth every morning.     . Multiple Vitamins-Minerals (PRESERVISION AREDS 2) CAPS Take 1 tablet by mouth 2 (two) times daily.     . nebivolol (BYSTOLIC) 5 MG tablet Take 5 mg by mouth daily.    . non-metallic deodorant Jethro Poling) MISC Apply 1 application topically daily as needed.    . rosuvastatin (CRESTOR) 10 MG tablet Take 10  mg by mouth 2 (two) times a week.     No current facility-administered medications for this encounter.     ALLERGIES: Cymbalta; Other; and Macrodantin   LABORATORY DATA:  Lab Results  Component Value Date   WBC 9.0 11/25/2015   HGB 14.5 11/25/2015   HCT 41.8 11/25/2015   MCV 86.9 11/25/2015   PLT 384 11/25/2015   Lab Results  Component Value Date   NA 140 12/23/2015   K 3.8 12/23/2015   CL 99* 12/23/2015   CO2 29 12/23/2015   No results found for: ALT, AST, GGT, ALKPHOS, BILITOT   NARRATIVE: Audrey [name] was seen today for weekly treatment management. The chart was checked and the patient's films were reviewed.  Weekly rad txs left breast 5/20 completed, no skin changes, swelling under left axilla, no pain, using raiaiplex bid,  Patient  Appetite  Fair, energy level fair 6:12 PM BP 155/70 mmHg  Pulse 70  Temp(Src) 97.9 F (36.6 C) (Oral)  Resp 20  Wt 173 lb 9.6 oz (78.744 kg)  Wt Readings from Last 3 Encounters:  01/27/16 173 lb 9.6 oz (78.744 kg)  01/13/16 175 lb (79.379 kg)  12/27/15 170 lb 9.6 oz (77.384 kg)    PHYSICAL  EXAMINATION: weight is 173 lb 9.6 oz (78.744 kg). Her oral temperature is 97.9 F (36.6 C). Her blood pressure is 155/70 and her pulse is 70. Her respiration is 20.        ASSESSMENT: The patient is doing satisfactorily with treatment.  PLAN: We will continue with the patient's radiation treatment as planned.

## 2016-01-27 NOTE — Telephone Encounter (Signed)
LEFT VM FOR PT TO CALL BACK ABOUT NUT. APPT.  (JE)

## 2016-01-30 ENCOUNTER — Ambulatory Visit
Admission: RE | Admit: 2016-01-30 | Discharge: 2016-01-30 | Disposition: A | Discharge status: Home / Self Care | Payer: PPO | Source: Ambulatory Visit | Attending: Radiation Oncology | Admitting: Radiation Oncology

## 2016-01-30 ENCOUNTER — Telehealth: Payer: Self-pay | Admitting: *Deleted

## 2016-01-30 DIAGNOSIS — Z510 Encounter for antineoplastic radiation therapy: Secondary | ICD-10-CM | POA: Diagnosis not present

## 2016-01-30 DIAGNOSIS — C50412 Malignant neoplasm of upper-outer quadrant of left female breast: Secondary | ICD-10-CM | POA: Diagnosis not present

## 2016-01-30 NOTE — Telephone Encounter (Signed)
Spoke with patient to follow up after start of radiation.  She has had her ups and downs.  She states she just feels overwhelmed at times coming everyday, but she's ok.  Encouraged her to call with any needs or concerns.

## 2016-01-31 ENCOUNTER — Ambulatory Visit
Admission: RE | Admit: 2016-01-31 | Discharge: 2016-01-31 | Disposition: A | Discharge status: Home / Self Care | Payer: PPO | Source: Ambulatory Visit | Attending: Radiation Oncology | Admitting: Radiation Oncology

## 2016-01-31 DIAGNOSIS — C50412 Malignant neoplasm of upper-outer quadrant of left female breast: Secondary | ICD-10-CM | POA: Diagnosis not present

## 2016-01-31 DIAGNOSIS — Z510 Encounter for antineoplastic radiation therapy: Secondary | ICD-10-CM | POA: Diagnosis not present

## 2016-02-01 ENCOUNTER — Ambulatory Visit
Admission: RE | Admit: 2016-02-01 | Discharge: 2016-02-01 | Disposition: A | Discharge status: Home / Self Care | Payer: PPO | Source: Ambulatory Visit | Attending: Radiation Oncology | Admitting: Radiation Oncology

## 2016-02-01 ENCOUNTER — Encounter: Payer: Self-pay | Admitting: Radiation Oncology

## 2016-02-01 DIAGNOSIS — C50412 Malignant neoplasm of upper-outer quadrant of left female breast: Secondary | ICD-10-CM | POA: Diagnosis not present

## 2016-02-01 DIAGNOSIS — Z510 Encounter for antineoplastic radiation therapy: Secondary | ICD-10-CM | POA: Diagnosis not present

## 2016-02-02 ENCOUNTER — Ambulatory Visit (HOSPITAL_BASED_OUTPATIENT_CLINIC_OR_DEPARTMENT_OTHER): Payer: PPO | Admitting: Genetic Counselor

## 2016-02-02 ENCOUNTER — Other Ambulatory Visit: Payer: PPO

## 2016-02-02 ENCOUNTER — Ambulatory Visit
Admission: RE | Admit: 2016-02-02 | Discharge: 2016-02-02 | Disposition: A | Discharge status: Home / Self Care | Payer: PPO | Source: Ambulatory Visit | Attending: Radiation Oncology | Admitting: Radiation Oncology

## 2016-02-02 DIAGNOSIS — Z807 Family history of other malignant neoplasms of lymphoid, hematopoietic and related tissues: Secondary | ICD-10-CM

## 2016-02-02 DIAGNOSIS — Z8041 Family history of malignant neoplasm of ovary: Secondary | ICD-10-CM | POA: Diagnosis not present

## 2016-02-02 DIAGNOSIS — Z808 Family history of malignant neoplasm of other organs or systems: Secondary | ICD-10-CM

## 2016-02-02 DIAGNOSIS — Z803 Family history of malignant neoplasm of breast: Secondary | ICD-10-CM

## 2016-02-02 DIAGNOSIS — Z315 Encounter for genetic counseling: Secondary | ICD-10-CM

## 2016-02-02 DIAGNOSIS — C50412 Malignant neoplasm of upper-outer quadrant of left female breast: Secondary | ICD-10-CM | POA: Diagnosis not present

## 2016-02-02 DIAGNOSIS — Z510 Encounter for antineoplastic radiation therapy: Secondary | ICD-10-CM | POA: Diagnosis not present

## 2016-02-03 ENCOUNTER — Encounter: Payer: Self-pay | Admitting: Radiation Oncology

## 2016-02-03 ENCOUNTER — Encounter: Payer: Self-pay | Admitting: Genetic Counselor

## 2016-02-03 ENCOUNTER — Ambulatory Visit
Admission: RE | Admit: 2016-02-03 | Discharge: 2016-02-03 | Disposition: A | Discharge status: Home / Self Care | Payer: PPO | Source: Ambulatory Visit | Attending: Radiation Oncology | Admitting: Radiation Oncology

## 2016-02-03 VITALS — BP 126/74 | HR 74 | Temp 98.3°F | Resp 20 | Wt 173.2 lb

## 2016-02-03 DIAGNOSIS — C50412 Malignant neoplasm of upper-outer quadrant of left female breast: Secondary | ICD-10-CM | POA: Diagnosis not present

## 2016-02-03 DIAGNOSIS — Z510 Encounter for antineoplastic radiation therapy: Secondary | ICD-10-CM | POA: Diagnosis not present

## 2016-02-03 DIAGNOSIS — Z803 Family history of malignant neoplasm of breast: Secondary | ICD-10-CM | POA: Insufficient documentation

## 2016-02-03 DIAGNOSIS — Z8041 Family history of malignant neoplasm of ovary: Secondary | ICD-10-CM | POA: Insufficient documentation

## 2016-02-03 NOTE — Progress Notes (Signed)
Department of Radiation Oncology  Phone:  [phone] Fax:        [phone]  Weekly Treatment Note    Name: Audrey [name] Date: 02/03/2016 MRN: [id number] DOB: 31-Aug-1941   Diagnosis:     ICD-9-CM ICD-10-CM   1. Breast cancer of upper-outer quadrant of left female breast ([place]) 174.4 C50.412      Current dose: 25 Gy  Current fraction: 10   MEDICATIONS: Current Outpatient Prescriptions  Medication Sig Dispense Refill  . aspirin 81 MG tablet Take 81 mg by mouth daily.    . Biotin 10 MG TABS Take 1 tablet by mouth daily.    . Calcium Carb-Cholecalciferol (CALCIUM + D3) 600-200 MG-UNIT TABS Take 1 tablet by mouth daily.     . calcium carbonate (TUMS - DOSED IN MG ELEMENTAL CALCIUM) 500 MG chewable tablet Chew 1 tablet by mouth daily as needed for indigestion.     . cholecalciferol (VITAMIN D) 1000 UNITS tablet Take 2,000 Units by mouth daily.    Marland Kitchen Co-Enzyme Q10 200 MG CAPS Take 1 capsule by mouth daily.     . Cranberry 200 MG CAPS Take 1 capsule by mouth daily.     . Cyanocobalamin (VITAMIN B-12) 5000 MCG SUBL Place 1 tablet under the tongue daily.    . Esomeprazole Magnesium (NEXIUM PO) Take 1 capsule by mouth daily.    Marland Kitchen gabapentin (NEURONTIN) 300 MG capsule One capsule twice during the day and 2 at night 360 capsule 2  . hyaluronate sodium (RADIAPLEXRX) GEL Apply 1 application topically 2 (two) times daily.    . hydrochlorothiazide (MICROZIDE) 12.5 MG capsule Take 1 capsule (12.5 mg total) by mouth daily. 90 capsule 1  . loratadine (CLARITIN) 10 MG tablet Take 10 mg by mouth daily as needed.     Marland Kitchen losartan (COZAAR) 50 MG tablet Take 50 mg by mouth every morning.     . Multiple Vitamins-Minerals (PRESERVISION AREDS 2) CAPS Take 1 tablet by mouth 2 (two) times daily.     . nebivolol (BYSTOLIC) 5 MG tablet Take 5 mg by mouth daily.    . non-metallic deodorant Jethro Poling) MISC Apply 1 application topically daily as needed.    . rosuvastatin (CRESTOR) 10 MG tablet Take 10  mg by mouth 2 (two) times a week.     No current facility-administered medications for this encounter.     ALLERGIES: Cymbalta; Other; and Macrodantin   LABORATORY DATA:  Lab Results  Component Value Date   WBC 9.0 11/25/2015   HGB 14.5 11/25/2015   HCT 41.8 11/25/2015   MCV 86.9 11/25/2015   PLT 384 11/25/2015   Lab Results  Component Value Date   NA 140 12/23/2015   K 3.8 12/23/2015   CL 99* 12/23/2015   CO2 29 12/23/2015   No results found for: ALT, AST, GGT, ALKPHOS, BILITOT   NARRATIVE: Audrey [name] was seen today for weekly treatment management. The chart was checked and the patient's films were reviewed.  Weekly rad  txs 10/20 completed, no skin changes  Stilll swelling under axilla, uses radiaplex bid, no c/o   Wt Readings from Last 3 Encounters:  02/03/16 173 lb 3.2 oz (78.563 kg)  01/27/16 173 lb 9.6 oz (78.744 kg)  01/13/16 175 lb (79.379 kg)    PHYSICAL EXAMINATION: weight is 173 lb 3.2 oz (78.563 kg). Her oral temperature is 98.3 F (36.8 C). Her blood pressure is 126/74 and her pulse is 74. Her respiration is 20.  ASSESSMENT: The patient is doing satisfactorily with treatment.  PLAN: We will continue with the patient's radiation treatment as planned.

## 2016-02-03 NOTE — Progress Notes (Signed)
Weekly rad  txs 10/20 completed, no skin changes  Stilll swelling under axilla, uses radiaplex bid, no c/o   Wt Readings from Last 3 Encounters:  01/27/16 173 lb 9.6 oz (78.744 kg)  01/13/16 175 lb (79.379 kg)  12/27/15 170 lb 9.6 oz (77.384 kg)

## 2016-02-03 NOTE — Progress Notes (Signed)
REFERRING PROVIDER: Truitt Merle, MD  PRIMARY PROVIDER:  [name], MD  PRIMARY REASON FOR VISIT:  1. Breast cancer of upper-outer quadrant of left female breast ([place])   2. Family history of breast cancer in female   3. Family history of ovarian cancer   4. Family history of skin cancer   5. Family history of Hodgkin's lymphoma      HISTORY OF PRESENT ILLNESS:   Ms. Audrey [name], a 75 y.o. female, was seen for a [facility] cancer genetics consultation at the request of Dr. Burr Medico due to a personal history of breast cancer and family history of breast, ovarian, and other cancers.  Ms. Boys had previous negative genetic testing through [facility] in 2015.  She presents to clinic today to discuss whether or not she should have additional testing and to discuss the possibility of a hereditary predisposition to cancer, her previous genetic testing results, and to further clarify her future cancer risks, as well as potential cancer risks for family members.   In January 2017, at the age of 80, Ms. Stehr was diagnosed with invasive ductal carcinoma with DCIS of the left breast.  Hormone receptor status was ER/PR+, Her2-. This was treated with left breast lumpectomy and radiation.    CANCER HISTORY:  Oncology History   Breast cancer of upper-outer quadrant of left female breast [facility])   Staging form: Breast, AJCC 7th Edition     Pathologic stage from 11/29/2015: Stage IA (T1c, N0, cM0) - Signed by Truitt Merle, MD on 01/14/2016     Clinical: Stage IA (T1c, N0, M0) - Unsigned        Breast cancer of upper-outer quadrant of left female breast ([place])   10/25/2015 Initial Biopsy Left breast core needle biopsy showed atypical ductal Hyperplasia   12/01/2015 Receptors her2 ER 90% positive, PR 20% positive, HER-2 negative, Ki-67 5%   12/01/2015 Pathology Results Left breast lumpectomy showed invasive ductal carcinoma, 1.2 cm, grade 2, and DCIS with necrosis and calcification, margins were negative.   12/01/2015  Surgery Left breast lumpectomy, no sentinel lymph node biopsy.   12/01/2015 Initial Diagnosis Breast cancer of upper-outer quadrant of left female breast ([place])   12/01/2015 Oncotype testing RS 7, which predicts 10- year distant recurrence risk of 6% with tamoxifen alone.   12/27/2015 Pathology Results Second surgery with sentinel lymph node biopsy showed no malignant cells in 5 lymph nodes     HORMONAL RISK FACTORS:  Menarche was at age 52.  First live birth at age 51.  OCP use for approximately 0 years.  Ovaries intact: yes.  Hysterectomy: no.  Menopausal status: postmenopausal.  HRT use: used for approx 2 weeks and stopped; started premarin cream recently years. Colonoscopy: yes; 06/2015: 1 tubular adenoma found in sigmoid colon. Mammogram within the last year: yes. Number of breast biopsies: 1. Up to date with pelvic exams:  yes. Any excessive radiation exposure in the past:  no, but does report history of secondhand smoke exposure when growing up  Past Medical History  Diagnosis Date  . 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 Obesity   . Reflux     buring and dysphagia sx resp to PPI; egd 3/06 neg for Barretts  . Hypertension   . Diastolic dysfunction   . 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 Complication of anesthesia     difficulty awakening   . PONV (postoperative nausea and vomiting)   .  Bladder infection     hx of last one approx 5 years ago   . Yeast infection     hx of   . GERD (gastroesophageal reflux disease)   . Arthritis   . Cancer [facility])     breast  . Breast cancer (HCC) 10/25/15    left breast  ADH   . Allergy     Past Surgical History  Procedure Laterality Date  . Colonoscopy  3/06    NEG  . Lumbar laminectomy    . Belpharoptosis repair Bilateral   . Eye surgery      cosmetic; eyelid surgery bilat   . Left rotator cuff repair      12/2006  . Finger surgery      left index finger/  cyst 02/2007  . Cortisone injections      right arm; back  . Knee arthroscopy Right 04/13/2015    Procedure: ARTHROSCOPY RIGHT KNEE WITH MEDIAL MENISCAL DEBRIDEMENT AND CHONDROPLASTY;  Surgeon: Ollen Gross, MD;  Location: [facility];  Service: Orthopedics;  Laterality: Right;  . Tonsillectomy    . Breast lumpectomy with radioactive seed localization Left 12/01/2015    Procedure: LEFT BREAST BIOPSYWITH RADIOACTIVE SEED LOCALIZATION;  Surgeon: Ovidio Kin, MD;  Location: [facility] OR;  Service: General;  Laterality: Left;  . Axillary lymph node biopsy Left 12/27/2015    Procedure: LEFT AXILLARY SENTINEL LYMPH NODE BIOPSY;  Surgeon: Ovidio Kin, MD;  Location: [facility];  Service: General;  Laterality: Left;    Social History   Social History  . Marital Status: Married    Spouse Name: N/A  . Number of Children: 2  . Years of Education: 13   Occupational History  . retired    Social History Main Topics  . Smoking status: Never Smoker   . Smokeless tobacco: Never Used  . Alcohol Use: No  . Drug Use: No  . Sexual Activity: Not Asked   Other Topics Concern  . None   Social History Narrative   Patient is right handed.   Patient drinks 2-3 cups of caffeine per day.     FAMILY HISTORY:  We obtained a detailed, 4-generation family history.  Significant diagnoses are listed below: Family History  Problem Relation Age of Onset  . Diabetes Mother   . Breast cancer Mother     dx. late 69s-early 71s; "metastasis to throat"?  . Hodgkin's lymphoma Father 27  . Breast cancer Sister     maternal half-sister dx. w/ DCIS in her late 91s;  . Other Sister     learning disabilities; lives on her own  . Alcohol abuse Brother   . Breast cancer Maternal Aunt     dx. 62s  . Ovarian cancer Maternal Aunt 59  . Breast cancer Paternal Aunt     dx. early 1s  . Depression Maternal Grandmother   . Heart Problems Maternal Grandfather   . Stroke Maternal Grandfather   . Heart Problems  Paternal Grandmother   . Skin cancer Daughter     dx. early 58s  . Other Daughter     insterstitial cystitis  . Heart Problems Paternal Aunt     Ms. Arnold has one daughter, age 55, and one son, age 65.  She has three granddaughters and two grandsons.  Ms. Boerema has two maternal half-brothers and one maternal half-sister.  One half brother passed away in his early 21s due to alcohol/drug use.  Ms. Nestler's other maternal half-brother is currently 52 and  has never had cancer.  Her half-sister has learning disabilities and has a history of DCIS which was diagnosed in her late 58s.  This sister has no children of her own.  Ms. Hynson is unaware of any cancer diagnoses in her nephew or niece.  Ms. Lada's mother died at the age of 22.  She had a history of breast cancer in her late 64s-early 57s that reportedly "metastasized to her throat".  Ms. Sprung's father died of rheumatic fever and Hodgkin's lymphoma at the age of 23.    Ms. Lawless's mother had five full sisters and one full brother.  Her brother died in WWII at the age of 15-20.  One sister died of ovarian cancer at the age of 15.  Another sister died in her late 77s and had a history of breast cancer that was diagnosed in her 40s.  The other three sisters died in their late 21s-mid-90s and never had cancer.  Ms. Pellegrin's maternal grandmother died at the age of 39.  Ms. Kerstetter's maternal grandfather died of stroke and heart problems in his late 59s.  Ms. Spikes has limited information for her maternal great aunts/uncles or great grandparents.    Ms. Rosenberg's father had two full sisters and one full brother.  One sister was diagnosed with breast cancer in her early 65s and passed away at an unspecified age.  The other sister died of heart problems.  Ms. Vaquerano's paternal uncle died in his early 60s, but Ms. Shawn has limited information for him.  Ms. Voorhies's paternal grandmother died of heart problems.  Ms. Constantin has no information for her  paternal great aunts/uncles or great grandparents.    Patient's maternal ancestors are of Caucasian/Dutch descent, and paternal ancestors are of Caucasian descent. There is no reported Ashkenazi Jewish ancestry. There is no known consanguinity.  GENETIC COUNSELING ASSESSMENT: NYSIA DELL is a 76 y.o. female with a personal and family history of cancer which.  She previously had negative genetic testing through a 19-gene panel.  We, therefore, discussed and recommended the following at today's visit.   DISCUSSION: We reviewed the characteristics, features and inheritance patterns of hereditary cancer syndromes. Ms. Filosa's family history still represents some of these characteristics including history of multiple cases of breast cancer in multiple generations of the family and breast cancer diagnoses at younger ages.  We discussed Ms. Quiles's negative genetic test result and reviewed the genes that were included on testing.  The 19-gene hereditary cancer panel includes sequencing and/or deletion/duplication analysis for the following genes: ATM, BARD1, BRCA1, BRCA2, BRIP1, CDH1, CHEK2, EPCAM, MLH1, MSH2, MSH6, NBN, PALB2, PMS2, PTEN, RAD51C, RAD51D, STK11, and TP53.  The date of report was July 18, 2014.  This test result demonstrates that no known pathogenic mutations were found in any of these genes reviewed by [facility].  We discussed that this laboratory is a somewhat newer laboratory, that they have recently updated their test to offer the analysis of 30 genes.  However, we also discussed that the additional 11 genes are genes that increase risks primarily for colon, pancreatic, and melanoma cancers--no new breast cancer genes have been added to this panel.  We discussed that this test was, then, comprehensive.  We also discussed that [facility] provides a reliable test today in 2017.  It will be worth looking into to determine whether the same quality of test was offered in 2015 when  Ms. Willcutt had testing.  I would also like to find out  if any variants of uncertain significance (VUSes) would have been reported on the test report, if found.    Thus, I will follow-up with [facility] to answer these questions.  In the meantime, we discussed that other scenarios might include the chance that all of the cancer history is due to a sporadic/non-genetic cause.  Because Ms. Seguin's cancer was diagnosed at an older age, we also discussed that there is potential that there is a genetic mutation in the family that explains the family history of breast cancer, but that Ms. Freiberger herself just did not inherit.  Discussed that Ms. Pazos's sister Daine Floras, if she is interested in testing and is able to consent to testing, then she could have genetic testing to determine if her early-onset breast cancer was caused by a genetic mutation.  Ms. Pfefferle will discuss this option with her family.  I will follow-up with Ms. Dacy as soon as I find out more information regarding her previous testing from [facility].  She is also eligible to have the updated 30-gene panel testing, if she is interested.  Likely the lab will need a new saliva sample and she will be charged a reduced price of approximately $160.  She is not interested in this test at this time, but will await further information about her previous testing.  PLAN: Since Ms. Mcgarvey has had updated, comprehensive genetic testing already we did not recommend any additional genetic testing for Ms. Luallen at today's visit. We reviewed her previous result and will reach out to [facility], the testing laboratory, to find out more information.  If we find that additional testing is indicated, that recommendation will be made at that time.  Ms. Givhan is also welcome to have updated genetic testing through [facility] for an additional cost.  She will reach out to [place] if she decides to have this updated panel test.  We understand this decision,  and remain available to coordinate genetic testing at any time in the future. In the meantime, we recommend Ms. Tamburri continue to follow the cancer screening guidelines given by her primary healthcare provider.  Based on Ms. Mitch's family history, we recommended her maternal half-sister, who was diagnosed with breast cancer in her late 36s, have genetic counseling and testing, if interested. Ms. Murry will let us know if we can be of any assistance in coordinating genetic counseling and/or testing for her sister.  Lastly, we encouraged Ms. Brocksmith to remain in contact with cancer genetics annually so that we can continuously update the family history and inform her of any changes in cancer genetics and testing that may be of benefit for this family.   Ms.  Virag's questions were answered to her satisfaction today. Our contact information was provided should additional questions or concerns arise. Thank you for the referral and allowing [place] to share in the care of your patient.   Jeanine Luz, MS, [employer] Genetic Counselor [email or web address] Phone: [phone]  The patient was seen for a total of 45 minutes in face-to-face genetic counseling.  This patient was discussed with Drs. Magrinat, Lindi Adie and/or Burr Medico who agrees with the above.    _______________________________________________________________________ For Office Staff:  Number of people involved in session: 1 Was an Intern/ student involved with case: no

## 2016-02-06 ENCOUNTER — Ambulatory Visit
Admission: RE | Admit: 2016-02-06 | Discharge: 2016-02-06 | Disposition: A | Discharge status: Home / Self Care | Payer: PPO | Source: Ambulatory Visit | Attending: Radiation Oncology | Admitting: Radiation Oncology

## 2016-02-06 DIAGNOSIS — Z510 Encounter for antineoplastic radiation therapy: Secondary | ICD-10-CM | POA: Diagnosis not present

## 2016-02-06 DIAGNOSIS — C50412 Malignant neoplasm of upper-outer quadrant of left female breast: Secondary | ICD-10-CM | POA: Diagnosis not present

## 2016-02-07 ENCOUNTER — Ambulatory Visit
Admission: RE | Admit: 2016-02-07 | Discharge: 2016-02-07 | Disposition: A | Discharge status: Home / Self Care | Payer: PPO | Source: Ambulatory Visit | Attending: Radiation Oncology | Admitting: Radiation Oncology

## 2016-02-07 DIAGNOSIS — C50412 Malignant neoplasm of upper-outer quadrant of left female breast: Secondary | ICD-10-CM | POA: Diagnosis not present

## 2016-02-07 DIAGNOSIS — Z510 Encounter for antineoplastic radiation therapy: Secondary | ICD-10-CM | POA: Diagnosis not present

## 2016-02-08 ENCOUNTER — Ambulatory Visit: Payer: PPO | Admitting: Radiation Oncology

## 2016-02-08 ENCOUNTER — Encounter: Payer: Self-pay | Admitting: Radiation Oncology

## 2016-02-08 ENCOUNTER — Ambulatory Visit
Admission: RE | Admit: 2016-02-08 | Discharge: 2016-02-08 | Disposition: A | Discharge status: Home / Self Care | Payer: PPO | Source: Ambulatory Visit | Attending: Radiation Oncology | Admitting: Radiation Oncology

## 2016-02-08 ENCOUNTER — Ambulatory Visit: Payer: PPO | Admitting: Nutrition

## 2016-02-08 DIAGNOSIS — C50412 Malignant neoplasm of upper-outer quadrant of left female breast: Secondary | ICD-10-CM | POA: Diagnosis not present

## 2016-02-08 DIAGNOSIS — Z510 Encounter for antineoplastic radiation therapy: Secondary | ICD-10-CM | POA: Diagnosis not present

## 2016-02-08 NOTE — Progress Notes (Signed)
75 year old female diagnosed with Breast Cancer.  She is a patient of Dr. Burr Medico.  PMH includes obesity, reflux, HTN, GERD.  Medications include Biotin, Calcium + Vitamin D3, CoQ10, cranberry, B12, Nexium, MVI, and Crestor  Labs include Glucose of 119.  Height: 4'11". Weight: 173.6 pounds. UBW:170 pounds. BMI: 35.04.  Patient wants to lose weight and choose healthier diet to avoid breast cancer recurrence. She exercises 2 times weekly at yoga and will use the eliptical or treadmill occasionally. She loves to bake and often does for husband.  Nutrition Diagnosis:  Food and Nutrition Related Knowledge Deficit related to Breast cancer as evidenced by no prior need for nutrition related information.  Intervention:   Educated patient on increasing plant based foods and limiting calories to ~1400 cal daily. Recommended increase exercise to 150 min weekly. Provided fact sheets and answered questions. Teach back method used.  Monitoring, Evaluation, Goals:  Patient will increase fruits, vegetables and plant based foods, limit calories, and increase activity to achieve safe weight loss.  No follow up scheduled.  **Disclaimer: This note was dictated with voice recognition software. Similar sounding words can inadvertently be transcribed and this note may contain transcription errors which may not have been corrected upon publication of note.**

## 2016-02-09 ENCOUNTER — Ambulatory Visit
Admission: RE | Admit: 2016-02-09 | Discharge: 2016-02-09 | Disposition: A | Discharge status: Home / Self Care | Payer: PPO | Source: Ambulatory Visit | Attending: Radiation Oncology | Admitting: Radiation Oncology

## 2016-02-09 DIAGNOSIS — C50412 Malignant neoplasm of upper-outer quadrant of left female breast: Secondary | ICD-10-CM | POA: Diagnosis not present

## 2016-02-09 DIAGNOSIS — Z510 Encounter for antineoplastic radiation therapy: Secondary | ICD-10-CM | POA: Diagnosis not present

## 2016-02-10 ENCOUNTER — Encounter: Payer: Self-pay | Admitting: Radiation Oncology

## 2016-02-10 ENCOUNTER — Ambulatory Visit
Admission: RE | Admit: 2016-02-10 | Discharge: 2016-02-10 | Disposition: A | Discharge status: Home / Self Care | Payer: PPO | Source: Ambulatory Visit | Attending: Radiation Oncology | Admitting: Radiation Oncology

## 2016-02-10 ENCOUNTER — Ambulatory Visit: Payer: PPO | Admitting: Radiation Oncology

## 2016-02-10 ENCOUNTER — Telehealth: Payer: Self-pay | Admitting: Genetic Counselor

## 2016-02-10 VITALS — BP 139/81 | HR 66 | Temp 98.1°F | Resp 20 | Wt 171.7 lb

## 2016-02-10 DIAGNOSIS — Z510 Encounter for antineoplastic radiation therapy: Secondary | ICD-10-CM | POA: Diagnosis not present

## 2016-02-10 DIAGNOSIS — C50412 Malignant neoplasm of upper-outer quadrant of left female breast: Secondary | ICD-10-CM

## 2016-02-10 NOTE — Progress Notes (Signed)
Department of Radiation Oncology  Phone:  [phone] Fax:        [phone]  Weekly Treatment Note    Name: Audrey [name] Date: 02/10/2016 MRN: [id number] DOB: [birth year]   Diagnosis:     ICD-9-CM ICD-10-CM   1. Breast cancer of upper-outer quadrant of left female breast ([place]) 174.4 C50.412      Current dose: 37.5 Gy  Current fraction: 15   MEDICATIONS: Current Outpatient Prescriptions  Medication Sig Dispense Refill  . aspirin 81 MG tablet Take 81 mg by mouth daily.    . Biotin 10 MG TABS Take 1 tablet by mouth daily.    . Calcium Carb-Cholecalciferol (CALCIUM + D3) 600-200 MG-UNIT TABS Take 1 tablet by mouth daily.     . calcium carbonate (TUMS - DOSED IN MG ELEMENTAL CALCIUM) 500 MG chewable tablet Chew 1 tablet by mouth daily as needed for indigestion.     . cholecalciferol (VITAMIN D) 1000 UNITS tablet Take 2,000 Units by mouth daily.    Marland Kitchen Co-Enzyme Q10 200 MG CAPS Take 1 capsule by mouth daily.     . Cranberry 200 MG CAPS Take 1 capsule by mouth daily.     . Cyanocobalamin (VITAMIN B-12) 5000 MCG SUBL Place 1 tablet under the tongue daily.    . Esomeprazole Magnesium (NEXIUM PO) Take 1 capsule by mouth daily.    Marland Kitchen gabapentin (NEURONTIN) 300 MG capsule One capsule twice during the day and 2 at night 360 capsule 2  . hyaluronate sodium (RADIAPLEXRX) GEL Apply 1 application topically 2 (two) times daily.    . hydrochlorothiazide (MICROZIDE) 12.5 MG capsule Take 1 capsule (12.5 mg total) by mouth daily. 90 capsule 1  . loratadine (CLARITIN) 10 MG tablet Take 10 mg by mouth daily as needed.     Marland Kitchen losartan (COZAAR) 50 MG tablet Take 50 mg by mouth every morning.     . Multiple Vitamins-Minerals (PRESERVISION AREDS 2) CAPS Take 1 tablet by mouth 2 (two) times daily.     . nebivolol (BYSTOLIC) 5 MG tablet Take 5 mg by mouth daily.    . non-metallic deodorant Jethro Poling) MISC Apply 1 application topically daily as needed.    . rosuvastatin (CRESTOR) 10 MG tablet Take 10  mg by mouth 2 (two) times a week.     No current facility-administered medications for this encounter.     ALLERGIES: Cymbalta; Other; and Macrodantin   LABORATORY DATA:  Lab Results  Component Value Date   WBC 9.0 11/25/2015   HGB 14.5 11/25/2015   HCT 41.8 11/25/2015   MCV 86.9 11/25/2015   PLT 384 11/25/2015   Lab Results  Component Value Date   NA 140 12/23/2015   K 3.8 12/23/2015   CL 99* 12/23/2015   CO2 29 12/23/2015   No results found for: ALT, AST, GGT, ALKPHOS, BILITOT   NARRATIVE: Imagin Audrey [name] was seen today for weekly treatment management. The chart was checked and the patient's films were reviewed.  Weekly rad txs, 15/20  Completed,  Mild erythema skin intact, using radiaplex gel, gave another tube,  No pain, appetite good getting fatigued,  2:19 PM' 2:19 PM .  PHYSICAL EXAMINATION: weight is 171 lb 11.2 oz (77.883 kg). Her oral temperature is 98.1 F (36.7 C). Her blood pressure is 139/81 and her pulse is 66. Her respiration is 20.      The patient's skin shows moderate erythema in the treatment area. No desquamation.  ASSESSMENT: The patient is doing satisfactorily  with treatment.  PLAN: We will continue with the patient's radiation treatment as planned. She is doing excellent right now and will finish after one additional week.

## 2016-02-10 NOTE — Telephone Encounter (Signed)
Reviewed with Audrey [name] what I had found out about [facility] and their testing in 2015, since our genetic counseling session on February 02, 2016.  Discussed that there would be no need to repeat testing based on increased quality because same testing was offered in 2015.  The only reason to order new testing would be if Audrey [name] wanted to add the additional 11 genes now offered by [facility] (none are breast and ovarian genes, but genes associated with colon, pancreatic, and melanoma risks).  Discussed that some labs offer additional genes that may confer lower breast cancer risks but do not currently have screening guidelines associated with those at this point.  Also, we had wondered if [facility] reported their VUSes back in 2015, and I learned that they have always done so.  Thus, Audrey [name]'s test result is negative and also did not find any variants of uncertain significance.  Audrey [name] is not interested in proceeding with additional testing at this time, but she is aware that she can call me if she would like to do so.  Also reviewed that her sister is eligible for genetic testing, if interested.  Audrey [name] will speak with her sister and get in touch with me, if she would like to come in for genetic counseling.  Audrey [name] knows she is welcome to call with any additional questions.

## 2016-02-10 NOTE — Progress Notes (Signed)
Complex simulation note  Diagnosis: Left-sided breast cancer  Narrative The patient has initially been planned to receive a course of whole breast radiation to a dose of 42.5 Gy in 17 fractions. The patient will now receive an additional boost to the seroma cavity which has been contoured. This will correspond to a boost of 7.5 Gy at 2.5 Gy per fraction. To accomplish this, an additional 3 customized blocks have been designed for this purpose. A complex isodose plan is requested to ensure that the target area is adequately covered with radiation dose and that the nearby normal structures such as the lung are adequately spared. The patient's final total dose will be 50 Gy.  ------------------------------------------------  Audrey Shew S. [name], MD, PhD 

## 2016-02-10 NOTE — Progress Notes (Signed)
Weekly rad txs, 15/20  Completed,  Mild erythema skin intact, using radiaplex gel, gave another tube,  No pain, appetite good getting fatigued,  2:06 PM' 2:06 PM .

## 2016-02-13 ENCOUNTER — Ambulatory Visit
Admission: RE | Admit: 2016-02-13 | Discharge: 2016-02-13 | Disposition: A | Discharge status: Home / Self Care | Payer: PPO | Source: Ambulatory Visit | Attending: Radiation Oncology | Admitting: Radiation Oncology

## 2016-02-13 DIAGNOSIS — C50412 Malignant neoplasm of upper-outer quadrant of left female breast: Secondary | ICD-10-CM | POA: Diagnosis not present

## 2016-02-13 DIAGNOSIS — Z510 Encounter for antineoplastic radiation therapy: Secondary | ICD-10-CM | POA: Diagnosis not present

## 2016-02-14 ENCOUNTER — Ambulatory Visit
Admission: RE | Admit: 2016-02-14 | Discharge: 2016-02-14 | Disposition: A | Discharge status: Home / Self Care | Payer: PPO | Source: Ambulatory Visit | Attending: Radiation Oncology | Admitting: Radiation Oncology

## 2016-02-14 DIAGNOSIS — Z510 Encounter for antineoplastic radiation therapy: Secondary | ICD-10-CM | POA: Diagnosis not present

## 2016-02-14 DIAGNOSIS — C50412 Malignant neoplasm of upper-outer quadrant of left female breast: Secondary | ICD-10-CM | POA: Diagnosis not present

## 2016-02-15 ENCOUNTER — Ambulatory Visit
Admission: RE | Admit: 2016-02-15 | Discharge: 2016-02-15 | Disposition: A | Discharge status: Home / Self Care | Payer: PPO | Source: Ambulatory Visit | Attending: Radiation Oncology | Admitting: Radiation Oncology

## 2016-02-15 DIAGNOSIS — C50412 Malignant neoplasm of upper-outer quadrant of left female breast: Secondary | ICD-10-CM | POA: Diagnosis not present

## 2016-02-15 DIAGNOSIS — Z510 Encounter for antineoplastic radiation therapy: Secondary | ICD-10-CM | POA: Diagnosis not present

## 2016-02-16 ENCOUNTER — Ambulatory Visit
Admission: RE | Admit: 2016-02-16 | Discharge: 2016-02-16 | Disposition: A | Discharge status: Home / Self Care | Payer: PPO | Source: Ambulatory Visit | Attending: Radiation Oncology | Admitting: Radiation Oncology

## 2016-02-16 DIAGNOSIS — Z510 Encounter for antineoplastic radiation therapy: Secondary | ICD-10-CM | POA: Diagnosis not present

## 2016-02-16 DIAGNOSIS — C50412 Malignant neoplasm of upper-outer quadrant of left female breast: Secondary | ICD-10-CM | POA: Diagnosis not present

## 2016-02-16 NOTE — Progress Notes (Signed)
Weekly Management Note Current Dose: 50 Gy  Projected Dose: 50 Gy   Narrative:  The patient presents for routine under treatment assessment.  CBCT/MVCT images/Port film x-rays were reviewed.  The chart was checked. Doing well. No complaints except fatigue.   Physical Findings: Minimal pink skin.   Impression:  The patient is tolerating radiation and completes today.   lan:  Discussed post RT skin care. Discussed survivorship. Follow up in 1 month. Has follow up today with Dr. Burr Medico.     ------------------------------------------------  Thea Silversmith, MD

## 2016-02-17 ENCOUNTER — Ambulatory Visit
Admission: RE | Admit: 2016-02-17 | Discharge: 2016-02-17 | Disposition: A | Discharge status: Home / Self Care | Payer: PPO | Source: Ambulatory Visit | Attending: Radiation Oncology | Admitting: Radiation Oncology

## 2016-02-17 ENCOUNTER — Encounter: Payer: Self-pay | Admitting: Radiation Oncology

## 2016-02-17 ENCOUNTER — Encounter: Payer: Self-pay | Admitting: Hematology

## 2016-02-17 ENCOUNTER — Ambulatory Visit (HOSPITAL_BASED_OUTPATIENT_CLINIC_OR_DEPARTMENT_OTHER): Payer: PPO | Admitting: Hematology

## 2016-02-17 ENCOUNTER — Telehealth: Payer: Self-pay | Admitting: Hematology

## 2016-02-17 ENCOUNTER — Other Ambulatory Visit (HOSPITAL_BASED_OUTPATIENT_CLINIC_OR_DEPARTMENT_OTHER): Payer: PPO

## 2016-02-17 ENCOUNTER — Telehealth: Payer: Self-pay | Admitting: *Deleted

## 2016-02-17 VITALS — BP 136/66 | HR 80 | Temp 98.3°F | Resp 18 | Ht 59.0 in | Wt 171.5 lb

## 2016-02-17 VITALS — BP 149/92 | HR 86 | Temp 98.2°F | Resp 16 | Wt 171.9 lb

## 2016-02-17 DIAGNOSIS — I10 Essential (primary) hypertension: Secondary | ICD-10-CM | POA: Diagnosis not present

## 2016-02-17 DIAGNOSIS — Z510 Encounter for antineoplastic radiation therapy: Secondary | ICD-10-CM | POA: Diagnosis not present

## 2016-02-17 DIAGNOSIS — C50412 Malignant neoplasm of upper-outer quadrant of left female breast: Secondary | ICD-10-CM

## 2016-02-17 DIAGNOSIS — G6289 Other specified polyneuropathies: Secondary | ICD-10-CM

## 2016-02-17 LAB — CBC WITH DIFFERENTIAL/PLATELET
BASO%: 0.6 % (ref 0.0–2.0)
BASOS ABS: 0 10*3/uL (ref 0.0–0.1)
EOS%: 1.5 % (ref 0.0–7.0)
Eosinophils Absolute: 0.1 10*3/uL (ref 0.0–0.5)
HEMATOCRIT: 42.6 % (ref 34.8–46.6)
HGB: 14.5 g/dL (ref 11.6–15.9)
LYMPH#: 1 10*3/uL (ref 0.9–3.3)
LYMPH%: 14.5 % (ref 14.0–49.7)
MCH: 29.8 pg (ref 25.1–34.0)
MCHC: 34.1 g/dL (ref 31.5–36.0)
MCV: 87.5 fL (ref 79.5–101.0)
MONO#: 0.7 10*3/uL (ref 0.1–0.9)
MONO%: 9.9 % (ref 0.0–14.0)
NEUT#: 5 10*3/uL (ref 1.5–6.5)
NEUT%: 73.5 % (ref 38.4–76.8)
Platelets: 328 10*3/uL (ref 145–400)
RBC: 4.87 10*6/uL (ref 3.70–5.45)
RDW: 13.1 % (ref 11.2–14.5)
WBC: 6.8 10*3/uL (ref 3.9–10.3)

## 2016-02-17 LAB — COMPREHENSIVE METABOLIC PANEL
ALT: 19 U/L (ref 0–55)
ANION GAP: 9 meq/L (ref 3–11)
AST: 24 U/L (ref 5–34)
Albumin: 3.9 g/dL (ref 3.5–5.0)
Alkaline Phosphatase: 64 U/L (ref 40–150)
BUN: 12 mg/dL (ref 7.0–26.0)
CALCIUM: 9.7 mg/dL (ref 8.4–10.4)
CHLORIDE: 104 meq/L (ref 98–109)
CO2: 29 mEq/L (ref 22–29)
Creatinine: 0.8 mg/dL (ref 0.6–1.1)
EGFR: 75 mL/min/{1.73_m2} — ABNORMAL LOW (ref >90)
Glucose: 96 mg/dl (ref 70–140)
POTASSIUM: 3.6 meq/L (ref 3.5–5.1)
Sodium: 142 mEq/L (ref 136–145)
Total Bilirubin: 0.59 mg/dL (ref 0.20–1.20)
Total Protein: 7.2 g/dL (ref 6.4–8.3)

## 2016-02-17 MED ORDER — EXEMESTANE 25 MG PO TABS: 25.0000 mg | ORAL_TABLET | Freq: Every day | ORAL | Status: DC

## 2016-02-17 NOTE — Progress Notes (Signed)
Weekly rad txs 20/20  Completed,  Mild erythema,  Skin intact, radiaplex gel bid,  Not interested in [facility] program, appetite good, mild fatigue 2:09 PM BP 149/92 mmHg  Pulse 86  Temp(Src) 98.2 F (36.8 C) (Oral)  Resp 16  Wt 171 lb 14.4 oz (77.973 kg)  Wt Readings from Last 3 Encounters:  02/17/16 171 lb 14.4 oz (77.973 kg)  02/10/16 171 lb 11.2 oz (77.883 kg)  02/03/16 173 lb 3.2 oz (78.563 kg)

## 2016-02-17 NOTE — Progress Notes (Signed)
[place]  [phone] [phone]  Clinic follow Up Note   Patient Care Team: [name], MD as PCP - General (Internal Medicine) Sueanne Margarita, MD as Consulting Physician (Cardiology) Jodi Marble, MD as Consulting Physician (Otolaryngology) [name], MD as Consulting Physician (General Surgery) Truitt Merle, MD as Consulting Physician (Hematology) Kyung Rudd, MD as Consulting Physician (Radiation Oncology) 02/17/2016   CHIEF COMPLAINTS:  Follow up left breast cancer   Oncology History   Breast cancer of upper-outer quadrant of left female breast [facility])   Staging form: Breast, AJCC 7th Edition     Pathologic stage from 11/29/2015: Stage IA (T1c, N0, cM0) - Signed by Truitt Merle, MD on 01/14/2016     Clinical: Stage IA (T1c, N0, M0) - Unsigned        Breast cancer of upper-outer quadrant of left female breast ([place])   10/25/2015 Initial Biopsy Left breast core needle biopsy showed atypical ductal Hyperplasia   12/01/2015 Receptors her2 ER 90% positive, PR 20% positive, HER-2 negative, Ki-67 5%   12/01/2015 Pathology Results Left breast lumpectomy showed invasive ductal carcinoma, 1.2 cm, grade 2, and DCIS with necrosis and calcification, margins were negative.   12/01/2015 Surgery Left breast lumpectomy, no sentinel lymph node biopsy.   12/01/2015 Initial Diagnosis Breast cancer of upper-outer quadrant of left female breast ([place])   12/01/2015 Oncotype testing RS 7, which predicts 10- year distant recurrence risk of 6% with tamoxifen alone.   12/27/2015 Pathology Results Second surgery with sentinel lymph node biopsy showed no malignant cells in 5 lymph nodes   02/15/2016 - 02/17/2016 Radiation Therapy Adjuvant left Breast radiation    HISTORY OF PRESENTING ILLNESS:  Audrey [name] 75 y.o. female is here because of her recently diagnosed left breast cancer.  This was dicovered by screening mammogram on 10/13/2015, which showed a 1.3 cm irregular focal  asymmetry in the 1:00 position of left breast. She underwent diagnostic mammogram and ultrasound and core needle biopsy, which showed atypical ductal hyperplasia. She was referred to breast surgeon Dr. Lucia Gaskins. She underwent left breast lumpectomy on 11/30/2014, no sentinel lymph nodes. 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  She has recovered very well, she took a few tablets of Tylenol after surgery, the pain has resolved. She is back to her normal exercise routine without any issues.  She has arthritis, especially knee, shoudler and hands, takes Aleve evarage once a day. She is fairly active, exercise daily at home, including yaga and treadmill. She has good appetite and eating well, no recent weight change. No other complaints.   GYN HISTORY  Menarchal: 13 LMP: 1991 Contraceptive: no  HRT: no  G2P2: (+) breast feeding   CURRENT THERAPY: left breast irradiation  INTERIM HISTORY [name] turns for follow-up. She is finishing her breast radiation today, so far has been tolerating where well. She had mild radiation dermatitis, no ulcer or infection. She had mild to moderate fatigue in the last few weeks, who is better this week. She overall feels well. She does have chronic arthritis, which is being controlled. No other new complaints.  MEDICAL HISTORY:  Past Medical History  Diagnosis Date  . 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 Obesity   . Reflux     buring and dysphagia sx resp to PPI; egd 3/06 neg for Barretts  . Hypertension   . Diastolic dysfunction   . 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 Complication of anesthesia     difficulty awakening   . PONV (postoperative nausea and vomiting)   . Bladder infection     hx of last one approx 5 years ago   . Yeast infection     hx of   . GERD (gastroesophageal reflux disease)   . Arthritis   . Cancer [facility])     breast  . Breast cancer ([place])  10/25/15    left breast  ADH   . Allergy     SURGICAL HISTORY: Past Surgical History  Procedure Laterality Date  . Colonoscopy  3/06    NEG  . Lumbar laminectomy    . Belpharoptosis repair Bilateral   . Eye surgery      cosmetic; eyelid surgery bilat   . Left rotator cuff repair      12/2006  . Finger surgery      left index finger/ cyst 02/2007  . Cortisone injections      right arm; back  . Knee arthroscopy Right 04/13/2015    Procedure: ARTHROSCOPY RIGHT KNEE WITH MEDIAL MENISCAL DEBRIDEMENT AND CHONDROPLASTY;  Surgeon: Gaynelle Arabian, MD;  Location: [facility];  Service: Orthopedics;  Laterality: Right;  . Tonsillectomy    . Breast lumpectomy with radioactive seed localization Left 12/01/2015    Procedure: LEFT BREAST [place] RADIOACTIVE SEED LOCALIZATION;  Surgeon: [name], MD;  Location: [place];  Service: General;  Laterality: Left;  . Axillary lymph node biopsy Left 12/27/2015    Procedure: LEFT AXILLARY SENTINEL LYMPH NODE BIOPSY;  Surgeon: [name], MD;  Location: [place];  Service: General;  Laterality: Left;    SOCIAL HISTORY: Social History   Social History  . Marital Status: Married    Spouse Name: N/A  . Number of Children: 2, daughter 71, son 31  . Years of Education: 13   Occupational History  . retired    Social History Main Topics  . Smoking status: Never Smoker   . Smokeless tobacco: Never Used  . Alcohol Use: No  . Drug Use: No  . Sexual Activity: Not on file   Other Topics Concern  . Not on file   Social History Narrative   Patient is right handed.   Patient drinks 2-3 cups of caffeine per day.    FAMILY HISTORY: Family History  Problem Relation Age of Onset  . Diabetes Mother   . Breast cancer Mother     dx. late 72s-early 43s; "metastasis to throat"?  . Hodgkin's lymphoma Father 57  . Breast cancer Sister     maternal half-sister dx. w/ DCIS in her late 72s;  . Other Sister     learning disabilities; lives on  her own  . Alcohol abuse Brother   . Breast cancer Maternal Aunt     dx. 75s  . Ovarian cancer Maternal Aunt 53  . Breast cancer Paternal Aunt     dx. early 20s  . Depression Maternal Grandmother   . Heart Problems Maternal Grandfather   . Stroke Maternal Grandfather   . Heart Problems Paternal Grandmother   . Skin cancer Daughter     dx. early 51s  . Other Daughter     insterstitial cystitis  . Heart Problems Paternal Aunt     ALLERGIES:  is allergic to cymbalta; other; and macrodantin.  MEDICATIONS:  Current Outpatient Prescriptions  Medication Sig Dispense Refill  . aspirin 81 MG tablet Take 81 mg by mouth daily.    Marland Kitchen  Biotin 10 MG TABS Take 1 tablet by mouth daily.    . Calcium Carb-Cholecalciferol (CALCIUM + D3) 600-200 MG-UNIT TABS Take 1 tablet by mouth daily.     . calcium carbonate (TUMS - DOSED IN MG ELEMENTAL CALCIUM) 500 MG chewable tablet Chew 1 tablet by mouth daily as needed for indigestion.     . cholecalciferol (VITAMIN D) 1000 UNITS tablet Take 2,000 Units by mouth daily.    Marland Kitchen Co-Enzyme Q10 200 MG CAPS Take 1 capsule by mouth daily.     . Cranberry 200 MG CAPS Take 1 capsule by mouth daily.     . Cyanocobalamin (VITAMIN B-12) 5000 MCG SUBL Place 1 tablet under the tongue daily.    . Esomeprazole Magnesium (NEXIUM PO) Take 1 capsule by mouth daily.    Marland Kitchen gabapentin (NEURONTIN) 300 MG capsule One capsule twice during the day and 2 at night 360 capsule 2  . hyaluronate sodium (RADIAPLEXRX) GEL Apply 1 application topically 2 (two) times daily.    . hydrochlorothiazide (MICROZIDE) 12.5 MG capsule Take 1 capsule (12.5 mg total) by mouth daily. 90 capsule 1  . loratadine (CLARITIN) 10 MG tablet Take 10 mg by mouth daily as needed.     Marland Kitchen losartan (COZAAR) 50 MG tablet Take 50 mg by mouth every morning.     . Multiple Vitamins-Minerals (PRESERVISION AREDS 2) CAPS Take 1 tablet by mouth 2 (two) times daily.     . nebivolol (BYSTOLIC) 5 MG tablet Take 5 mg by mouth daily.     . non-metallic deodorant Jethro Poling) MISC Apply 1 application topically daily as needed.    . rosuvastatin (CRESTOR) 10 MG tablet Take 10 mg by mouth 2 (two) times a week.    Marland Kitchen exemestane (AROMASIN) 25 MG tablet Take 1 tablet (25 mg total) by mouth daily after breakfast. 30 tablet 2   No current facility-administered medications for this visit.    REVIEW OF SYSTEMS:   Constitutional: Denies fevers, chills or abnormal night sweats Eyes: Denies blurriness of vision, double vision or watery eyes Ears, nose, mouth, throat, and face: Denies mucositis or sore throat Respiratory: Denies cough, dyspnea or wheezes Cardiovascular: Denies palpitation, chest discomfort or lower extremity swelling Gastrointestinal:  Denies nausea, heartburn or change in bowel habits Skin: Denies abnormal skin rashes Lymphatics: Denies new lymphadenopathy or easy bruising Neurological:Denies numbness, tingling or new weaknesses Behavioral/Psych: Mood is stable, no new changes  All other systems were reviewed with the patient and are negative.  PHYSICAL EXAMINATION: ECOG PERFORMANCE STATUS: 0 - Asymptomatic  Filed Vitals:   02/17/16 1451  BP: 136/66  Pulse: 80  Temp: 98.3 F (36.8 C)  Resp: 18   Filed Weights   02/17/16 1451  Weight: 171 lb 8 oz (77.792 kg)    GENERAL:alert, no distress and comfortable SKIN: skin color, texture, turgor are normal, no rashes or significant lesions EYES: normal, conjunctiva are pink and non-injected, sclera clear OROPHARYNX:no exudate, no erythema and lips, buccal mucosa, and tongue normal  NECK: supple, thyroid normal size, non-tender, without nodularity LYMPH:  no palpable lymphadenopathy in the cervical, axillary or inguinal LUNGS: clear to auscultation and percussion with normal breathing effort HEART: regular rate & rhythm and no murmurs and no lower extremity edema ABDOMEN:abdomen soft, non-tender and normal bowel sounds Musculoskeletal:no cyanosis of digits and no  clubbing  PSYCH: alert & oriented x 3 with fluent speech NEURO: no focal motor/sensory deficits  LABORATORY DATA:  I have reviewed the data as listed CBC Latest Ref  Rng 02/17/2016 11/25/2015  WBC 3.9 - 10.3 10e3/uL 6.8 9.0  Hemoglobin 11.6 - 15.9 g/dL 14.5 14.5  Hematocrit 34.8 - 46.6 % 42.6 41.8  Platelets 145 - 400 10e3/uL 328 384    CMP Latest Ref Rng 02/17/2016 12/23/2015 11/25/2015  Glucose 70 - 140 mg/dl 96 119(H) 136(H)  BUN 7.0 - 26.0 mg/dL 12.0 10 8  Creatinine 0.6 - 1.1 mg/dL 0.8 0.83 0.68  Sodium 136 - 145 mEq/L 142 140 139  Potassium 3.5 - 5.1 mEq/L 3.6 3.8 3.4(L)  Chloride 101 - 111 mmol/L - 99(L) 101  CO2 22 - 29 mEq/L _0 Calcium 8.4 - 10.4 mg/dL 9.7 9.5 9.6  Total Protein 6.4 - 8.3 g/dL 7.2 - -  Total Bilirubin 0.20 - 1.20 mg/dL 0.59 - -  Alkaline Phos 40 - 150 U/L 64 - -  AST 5 - 34 U/L 24 - -  ALT 0 - 55 U/L 19 - -     PATHOLOGY REPORT:  Diagnosis 10/25/2015 Breast, left, needle core biopsy - ATYPICAL DUCTAL HYPERPLASIA. Microscopic Comment The findings are called to [facility]) on 10/26/2015. Dr. Donato Heinz has seen this case in consultation with agreement. (RH:kh 10/26/2015)   Diagnosis 12/01/2015 Breast, lumpectomy, left - INVASIVE DUCTAL CARCINOMA, SEE COMMENT. - INVASIVE TUMOR IS 8 MM FROM THE NEAREST MARGIN (SUPERIOR) - DUCTAL CARCINOMA IN SITU WITH NECROSIS AND CALCIFICATIONS. - IN SITU CARCINOMA IS 4 MM FROM THE NEAREST MARGIN (SUPERIOR) - PREVIOUS BIOPSY SITE TISSUE CHANGE. - SEE TUMOR SYNOPTIC TEMPLATE BELOW. Microscopic Comment BREAST, INVASIVE TUMOR, WITHOUT LYMPH NODES PRESENT Specimen, including laterality : Left breast without sentinel lymph node sampling Procedure: Lumpectomy Histologic type: Ductal Grade: 2 of 3 Tubule formation: 3 Nuclear pleomorphism: 2 Mitotic: 1 Tumor size (gross measurement): 1.2 cm Margins: Invasive, distance to closest margin: 8 mm In-situ, distance to closest  margin: 4 mm If margin positive, focally or broadly: N/A Lymphovascular invasion: Absent Ductal carcinoma in situ: Present Grade: 2 of 3 Extensive intraductal component: Absent Lobular neoplasia: Absent Tumor focality: Unifocal Treatment effect: None If present, treatment effect in breast tissue, lymph nodes or both: N/A Extent of tumor: Skin: N/A Nipple: N/A Skeletal muscle: N/A Breast prognostic profile: Estrogen receptor: Pending and will be reported in an addendum Progesterone receptor: Pending and will be reported in an addendum. Her 2 neu: Pending and will be reported in an addendum Ki-67: Pending and will be reported in an addendum. Non-neoplastic breast: Previous biopsy site tissue change, fibrocystic changes and microcalcifications. TNM: pT1c, pNX, pMX Comments: Multiple recut sections were examined. The absence of the myoepithelial layer was confirmed with smooth muscle myosin heavy chain, Calponin and p63 immunostains (blocks 1A and 1C). (CR:kh 12-05-15)  Results: IMMUNOHISTOCHEMICAL AND MORPHOMETRIC ANALYSIS PERFORMED MANUALLY Estrogen Receptor: 90%, POSITIVE, STRONG STAINING INTENSITY Progesterone Receptor: 20%, POSITIVE, STRONG STAINING INTENSITY Proliferation Marker Ki67: 5%  Results: HER2 - NEGATIVE RATIO OF HER2/CEP17 SIGNALS 1.33 AVERAGE HER2 COPY NUMBER PER CELL 2.85  Diagnosis 12/27/2015 1. Fatty tissue BENIGN FIBROADIPOSE TISSUE 2. Lymph nodes, regional resection, Left axiliary node FIVE BENIGN LYMPH NODES (0/5)  ONCOTYPE Dx RS 7, which predicts 10- year distant recurrence risk of 6% with tamoxifen alone.  RADIOGRAPHIC STUDIES: I have personally reviewed the radiological images as listed and agreed with the findings in the report. No results found.  ASSESSMENT & PLAN:  75 year old Caucasian female, with past medical history of hypertension, arthritis, presented with screening discovered left breast cancer.  1. Breast cancer  of upper outer quadrant  of left female breast, invasive ductal carcinoma, pT1cN0M0, ER+/PR+/HER2-, and DCIS -I reviewed her postsurgical pathology findings with her in details.  -I reviewed the Oncotype test result, the recurrence score is 7, which is a low risk, she would not benefit from adjuvant chemotherapy.  -Giving the strong ER/PR positivity, I recommend her to have a aromatase inhibitor as adjuvant endocrine therapy to reduce risk of cancer recurrence. Potential side effects, which includes but not limited to, hot flash, skin and vaginal dryness, slightly increased risk of cardiovascular disease and cataract, muscular joint discomfort, osteoporosis, were discussed with her in great details.  She has moderate arthritis, I recommend exemestane. 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 She is willing to try endocrine therapy, but would like to stop if she experiences any significant side effects, especially side effects on her vision. She declined tamoxifen, which her mother took before. -she has completed radiation now, tolerated well overall. -She is little reluctant initially, but decided to try exemestane to see if she can tolerate. She will like to start from lower dose, and every other day, if she is able tolerate it well in 2 weeks, she is agreeable to go on for dose. I sent a prescription to her pharmacy today  -We discussed the breast cancer surveillance, including annual screening mammogram, self-exam, routine follow-up with lab. -I encouraged her to continue healthy diet and exercise regularly. -She has been referred to survivorship clinic, I encouraged her to come.   2. HTN, arthritis,  -She will continue follow-up with her primary care physician, and exercise  3. Genetics -She has strong family history of breast and ovarian cancer. -She had genetic testing done at [facility] and was negative for BRCA1, BRCA2, CHECK2, ATM, BARD1, MLH1, MSH2, MSH6, PALB2, PMS2,  PTEN, RAD51C, RAD51D, STK11 and TP53 -She is very interested in genetic counseling, and would like to know if she needs additional genetic testing. She has a daughter, she is very concerned about her daughter's risk of breast cancer.  -She was seen by our genetic counselor Ms. Boggs who did not recommend further more genetic testing.    Plan -she will start exemestane in 2 weeks, from 1 tablet every other day, if she tolerates well in 2 weeks, she'll go to 1 tablet daily. Prescription was called in to her pharmacy today -Lab today and on next visit -I'll see her back in 2 months -She has been referred to survivorship clinic.   All questions were answered. The patient knows to call the clinic with any problems, questions or concerns. I spent 25 minutes counseling the patient face to face. The total time spent in the appointment was 30 minutes and more than 50% was on counseling.     Truitt Merle, MD 02/17/2016

## 2016-02-17 NOTE — Telephone Encounter (Signed)
Gave and printed appt sched and avs fo rpt for May °

## 2016-02-17 NOTE — Telephone Encounter (Signed)
Left vm to congratulate pt on completing xrt and need to further discuss survivorship program.

## 2016-02-20 ENCOUNTER — Ambulatory Visit: Admission: RE | Admit: 2016-02-20 | Payer: PPO | Source: Ambulatory Visit

## 2016-02-20 ENCOUNTER — Ambulatory Visit: Payer: PPO | Admitting: Hematology

## 2016-02-20 ENCOUNTER — Telehealth: Payer: Self-pay | Admitting: *Deleted

## 2016-02-20 NOTE — Telephone Encounter (Signed)
Pt return call.  Congratulate on completion of xrt. Relate doing well and without complaints. Discussed survivorship program and referral. Encourage pt to call with further needs. Received verbal understanding.

## 2016-02-22 ENCOUNTER — Telehealth: Payer: Self-pay | Admitting: Nurse Practitioner

## 2016-02-22 ENCOUNTER — Other Ambulatory Visit: Payer: Self-pay | Admitting: Adult Health

## 2016-02-22 DIAGNOSIS — C50412 Malignant neoplasm of upper-outer quadrant of left female breast: Secondary | ICD-10-CM

## 2016-02-22 NOTE — Telephone Encounter (Signed)
cld & spoke to pt and gave pt time & date of 6/8 @ 2:30 appt

## 2016-03-05 NOTE — Progress Notes (Signed)
°  Radiation Oncology         [phone] ________________________________  Name: Audrey [name] MRN: [id number]  Date: 02/17/2016  DOB: 1941/02/23  End of Treatment Note  Diagnosis: Left- sided breast cancer      Indication for treatment:  Curative       Radiation treatment dates:   01/23/16- 02/17/16  Site/dose:   Left breast treated to 42.5 Gy in 17 fractions, Left breast boost treated to 7.5 Gy in 3 fractions   Beams/energy: Left breast : 3D-Breath Hold / 10x, 15X Left breast boost: 3 field / 10X, 6X  Narrative: The patient tolerated radiation treatment relatively well.     Plan: The patient has completed radiation treatment. The patient will return to radiation oncology clinic for routine followup in one month. I advised them to call or return sooner if they have any questions or concerns related to their recovery or treatment.  ------------------------------------------------  [name], MD, PhD  This document serves as a record of services personally performed by Kyung Rudd, MD. It was created on his behalf by Derek Mound, a trained medical scribe. The creation of this record is based on the scribe's personal observations and the provider's statements to them. This document has been checked and approved by the attending provider.

## 2016-03-07 NOTE — Addendum Note (Signed)
Encounter addended by: Kyung Rudd, MD on: 03/07/2016 10:43 AM<BR>     Documentation filed: Notes Section, Visit Diagnoses

## 2016-03-07 NOTE — Progress Notes (Signed)
  Radiation Oncology         [phone] ________________________________  Name: Audrey [name] MRN: [id number]  Date: 02/15/2016  DOB: 04/03/1941  Simulation Verification Note   NARRATIVE: The patient was brought to the treatment unit and placed in the planned treatment position. The clinical setup was verified. Then port films were obtained and uploaded to the radiation oncology medical record software.  The treatment beams were carefully compared against the planned radiation fields. The position, location, and shape of the radiation fields was reviewed. The targeted volume of tissue appears to be appropriately covered by the radiation beams. Based on my personal review, I approved the simulation verification. The patient's treatment will proceed as planned.  ________________________________   [name], MD, PhD

## 2016-03-22 ENCOUNTER — Ambulatory Visit
Admission: RE | Admit: 2016-03-22 | Discharge: 2016-03-22 | Disposition: A | Discharge status: Home / Self Care | Payer: PPO | Source: Ambulatory Visit | Attending: Radiation Oncology | Admitting: Radiation Oncology

## 2016-03-22 ENCOUNTER — Encounter: Payer: Self-pay | Admitting: Radiation Oncology

## 2016-03-22 VITALS — BP 134/69 | HR 71 | Temp 97.7°F | Ht 59.0 in | Wt 169.7 lb

## 2016-03-22 DIAGNOSIS — C50412 Malignant neoplasm of upper-outer quadrant of left female breast: Secondary | ICD-10-CM

## 2016-03-22 NOTE — Progress Notes (Addendum)
Ms. Fonti reports that she recently had the flu, thus the reason for her continued fatigue. Mild change in pigmentation on her left breast - mild redness with hyperpigmentation in the axillary region.    She has elected to not take Aromasin.

## 2016-03-23 NOTE — Progress Notes (Addendum)
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  Interval Since Last Radiation:  1 month  01/23/16- 02/17/16: Left breast treated to 42.5 Gy in 17 fractions, Left breast boost treated to 7.5 Gy in 3 fractions   Narrative:  The patient returns today for routine follow-up. During the course of treatment, she did very well, and did not have any significant desquamation.  On review of systems, the patient reports that she is doing great, she states that she is ready to move forward with surveillance. She has recovered since having the flu a few weeks ago, and states that she does have some fatigue but believes that was the cause of her symptoms. He She has met back with Dr. Burr Medico, and has decided not to pursue an aromatase inhibitor. She is concerned about the significant osteoarthritis symptoms she already has an is not interested in experiencing this with another medication as well. She has plans to follow-up with Dr. Burr Medico in a few weeks to discuss this further. She denies any skin irritation, chest pain, shortness of breath, fevers or chills. No other complaints or verbalized.                             ALLERGIES:  is allergic to cymbalta; other; and macrodantin.  Meds: Current Outpatient Prescriptions  Medication Sig Dispense Refill  . aspirin 81 MG tablet Take 81 mg by mouth daily.    . Biotin 10 MG TABS Take 1 tablet by mouth daily.    . Calcium Carb-Cholecalciferol (CALCIUM + D3) 600-200 MG-UNIT TABS Take 1 tablet by mouth daily.     . calcium carbonate (TUMS - DOSED IN MG ELEMENTAL CALCIUM) 500 MG chewable tablet Chew 1 tablet by mouth daily as needed for indigestion.     . cholecalciferol (VITAMIN D) 1000  UNITS tablet Take 2,000 Units by mouth daily.    Marland Kitchen Co-Enzyme Q10 200 MG CAPS Take 1 capsule by mouth daily.     . Cranberry 200 MG CAPS Take 1 capsule by mouth daily.     . Cyanocobalamin (VITAMIN B-12) 5000 MCG SUBL Place 1 tablet under the tongue daily.    . Esomeprazole Magnesium (NEXIUM PO) Take 1 capsule by mouth daily.    Marland Kitchen gabapentin (NEURONTIN) 300 MG capsule One capsule twice during the day and 2 at night 360 capsule 2  . hyaluronate sodium (RADIAPLEXRX) GEL Apply 1 application topically 2 (two) times daily.    . hydrochlorothiazide (MICROZIDE) 12.5 MG capsule Take 1 capsule (12.5 mg total) by mouth daily. 90 capsule 1  . loratadine (CLARITIN) 10 MG tablet Take 10 mg by mouth daily as needed.     Marland Kitchen losartan (COZAAR) 50 MG tablet Take 50 mg by mouth every morning.     . Multiple Vitamins-Minerals (PRESERVISION AREDS 2) CAPS Take 1 tablet by mouth 2 (two) times daily.     . nebivolol (BYSTOLIC) 5 MG tablet Take 5 mg by mouth daily.    . non-metallic deodorant Jethro Poling) MISC Apply 1 application topically daily as needed.    . rosuvastatin (CRESTOR) 10 MG tablet Take 10 mg by mouth 2 (two) times a week.    Marland Kitchen  exemestane (AROMASIN) 25 MG tablet Take 1 tablet (25 mg total) by mouth daily after breakfast. (Patient not taking: Reported on 03/22/2016) 30 tablet 2   No current facility-administered medications for this encounter.    Physical Findings:  height is _0  (1.499 m) and weight is 169 lb 11.2 oz (76.975 kg). Her temperature is 97.7 F (36.5 C). Her blood pressure is 134/69 and her pulse is 71.   Pain scale 0/10 In general this is a well appearing Caucasian female in no acute distress. She's alert and oriented x4 and appropriate throughout the examination. Cardiopulmonary assessment is negative for acute distress and she exhibits normal effort. The skin of the left breast is intact without evidence of desquamation, there is some hyperpigmentation that extends into the axilla without skin  breakdown.  Lab Findings: Lab Results  Component Value Date   WBC 6.8 02/17/2016   HGB 14.5 02/17/2016   HCT 42.6 02/17/2016   MCV 87.5 02/17/2016   PLT 328 02/17/2016     Radiographic Findings: No results found.  Impression/Plan: 1. 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 The patient appears to be doing very well following her radiotherapy. We discussed the use of vitamin E oil this time, and that she should contact us should she have any questions or concerns moving forward regarding her radiotherapy. Regarding her estrogen receptor status, she will be meeting with Dr. Burr Medico in a few weeks,  and at that point time we'll discuss further the concern she has about Aromasin. 2. Survivorship. Patient has follow-up with Chestine Spore, NP. We have emphasized the importance of survivorship clinic.    Audrey [name], PAC

## 2016-04-09 DIAGNOSIS — E559 Vitamin D deficiency, unspecified: Secondary | ICD-10-CM | POA: Diagnosis not present

## 2016-04-09 DIAGNOSIS — N390 Urinary tract infection, site not specified: Secondary | ICD-10-CM | POA: Diagnosis not present

## 2016-04-09 DIAGNOSIS — M8580 Other specified disorders of bone density and structure, unspecified site: Secondary | ICD-10-CM | POA: Diagnosis not present

## 2016-04-09 DIAGNOSIS — I10 Essential (primary) hypertension: Secondary | ICD-10-CM | POA: Diagnosis not present

## 2016-04-09 DIAGNOSIS — E039 Hypothyroidism, unspecified: Secondary | ICD-10-CM | POA: Diagnosis not present

## 2016-04-12 DIAGNOSIS — N390 Urinary tract infection, site not specified: Secondary | ICD-10-CM | POA: Diagnosis not present

## 2016-04-12 DIAGNOSIS — E7800 Pure hypercholesterolemia, unspecified: Secondary | ICD-10-CM | POA: Diagnosis not present

## 2016-04-12 DIAGNOSIS — E039 Hypothyroidism, unspecified: Secondary | ICD-10-CM | POA: Diagnosis not present

## 2016-04-12 DIAGNOSIS — I10 Essential (primary) hypertension: Secondary | ICD-10-CM | POA: Diagnosis not present

## 2016-04-13 ENCOUNTER — Ambulatory Visit (HOSPITAL_BASED_OUTPATIENT_CLINIC_OR_DEPARTMENT_OTHER): Payer: PPO | Admitting: Hematology

## 2016-04-13 ENCOUNTER — Encounter: Payer: Self-pay | Admitting: Hematology

## 2016-04-13 ENCOUNTER — Telehealth: Payer: Self-pay | Admitting: Hematology

## 2016-04-13 ENCOUNTER — Other Ambulatory Visit: Payer: PPO

## 2016-04-13 VITALS — BP 138/72 | HR 80 | Temp 98.4°F | Resp 18 | Ht 59.0 in | Wt 169.3 lb

## 2016-04-13 DIAGNOSIS — C50412 Malignant neoplasm of upper-outer quadrant of left female breast: Secondary | ICD-10-CM

## 2016-04-13 DIAGNOSIS — N951 Menopausal and female climacteric states: Secondary | ICD-10-CM | POA: Diagnosis not present

## 2016-04-13 DIAGNOSIS — I10 Essential (primary) hypertension: Secondary | ICD-10-CM | POA: Diagnosis not present

## 2016-04-13 DIAGNOSIS — G6289 Other specified polyneuropathies: Secondary | ICD-10-CM

## 2016-04-13 NOTE — Progress Notes (Signed)
[place]  [phone] [phone]  Clinic follow Up Note   Patient Care Team: [name], MD as PCP - General (Internal Medicine) Sueanne Margarita, MD as Consulting Physician (Cardiology) Jodi Marble, MD as Consulting Physician (Otolaryngology) [name], MD as Consulting Physician (General Surgery) Truitt Merle, MD as Consulting Physician (Hematology) Kyung Rudd, MD as Consulting Physician (Radiation Oncology) 04/13/2016   CHIEF COMPLAINTS:  Follow up left breast cancer   Oncology History   Breast cancer of upper-outer quadrant of left female breast [facility])   Staging form: Breast, AJCC 7th Edition     Pathologic stage from 11/29/2015: Stage IA (T1c, N0, cM0) - Signed by Truitt Merle, MD on 01/14/2016     Clinical: Stage IA (T1c, N0, M0) - Unsigned        Breast cancer of upper-outer quadrant of left female breast ([place])   10/25/2015 Initial Biopsy Left breast core needle biopsy showed atypical ductal Hyperplasia   12/01/2015 Receptors her2 ER 90% positive, PR 20% positive, HER-2 negative, Ki-67 5%   12/01/2015 Pathology Results Left breast lumpectomy showed invasive ductal carcinoma, 1.2 cm, grade 2, and DCIS with necrosis and calcification, margins were negative.   12/01/2015 Surgery Left breast lumpectomy, no sentinel lymph node biopsy.   12/01/2015 Initial Diagnosis Breast cancer of upper-outer quadrant of left female breast ([place])   12/01/2015 Oncotype testing RS 7, which predicts 10- year distant recurrence risk of 6% with tamoxifen alone.   12/27/2015 Pathology Results Second surgery with sentinel lymph node biopsy showed no malignant cells in 5 lymph nodes   02/15/2016 - 02/17/2016 Radiation Therapy Adjuvant left Breast radiation    HISTORY OF PRESENTING ILLNESS:  Audrey [name] 75 y.o. female is here because of her recently diagnosed left breast cancer.  This was dicovered by screening mammogram on 10/13/2015, which showed a 1.3 cm irregular focal  asymmetry in the 1:00 position of left breast. She underwent diagnostic mammogram and ultrasound and core needle biopsy, which showed atypical ductal hyperplasia. She was referred to breast surgeon Dr. Lucia Gaskins. She underwent left breast lumpectomy on 11/30/2014, no sentinel lymph nodes. 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  She has recovered very well, she took a few tablets of Tylenol after surgery, the pain has resolved. She is back to her normal exercise routine without any issues.  She has arthritis, especially knee, shoudler and hands, takes Aleve evarage once a day. She is fairly active, exercise daily at home, including yaga and treadmill. She has good appetite and eating well, no recent weight change. No other complaints.   GYN HISTORY  Menarchal: 13 LMP: 1991 Contraceptive: no  HRT: no  G2P2: (+) breast feeding   CURRENT THERAPY:  Surveillance, she has not started adjuvant exemestane yet   INTERIM HISTORY Audrey [name] turns for follow-up. She has recovered very well from her radiation, has trouble twice to [place] in the past few months. She feels well, denies any pain or other new symptoms. She does have mild hot flashes, manageable. She has not started exemestane yet, due to the concern of side effects from medication while she is on vacation.   MEDICAL HISTORY:  Past Medical History  Diagnosis Date  . 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 Obesity   . Reflux     buring and dysphagia sx resp to PPI; egd 3/06 neg for Barretts  . Hypertension   .  Diastolic dysfunction   . 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 Complication of anesthesia     difficulty awakening   . PONV (postoperative nausea and vomiting)   . Bladder infection     hx of last one approx 5 years ago   . Yeast infection     hx of   . GERD (gastroesophageal reflux disease)   . Arthritis   . Cancer [facility])      breast  . Breast cancer ([place]) 10/25/15    left breast  ADH   . Allergy     SURGICAL HISTORY: Past Surgical History  Procedure Laterality Date  . Colonoscopy  3/06    NEG  . Lumbar laminectomy    . Belpharoptosis repair Bilateral   . Eye surgery      cosmetic; eyelid surgery bilat   . Left rotator cuff repair      12/2006  . Finger surgery      left index finger/ cyst 02/2007  . Cortisone injections      right arm; back  . Knee arthroscopy Right 04/13/2015    Procedure: ARTHROSCOPY RIGHT KNEE WITH MEDIAL MENISCAL DEBRIDEMENT AND CHONDROPLASTY;  Surgeon: Gaynelle Arabian, MD;  Location: [facility];  Service: Orthopedics;  Laterality: Right;  . Tonsillectomy    . Breast lumpectomy with radioactive seed localization Left 12/01/2015    Procedure: LEFT BREAST [place] RADIOACTIVE SEED LOCALIZATION;  Surgeon: [name], MD;  Location: [place];  Service: General;  Laterality: Left;  . Axillary lymph node biopsy Left 12/27/2015    Procedure: LEFT AXILLARY SENTINEL LYMPH NODE BIOPSY;  Surgeon: [name], MD;  Location: [place];  Service: General;  Laterality: Left;    SOCIAL HISTORY: Social History   Social History  . Marital Status: Married    Spouse Name: N/A  . Number of Children: 2, daughter 57, son 10  . Years of Education: 13   Occupational History  . retired    Social History Main Topics  . Smoking status: Never Smoker   . Smokeless tobacco: Never Used  . Alcohol Use: No  . Drug Use: No  . Sexual Activity: Not on file   Other Topics Concern  . Not on file   Social History Narrative   Patient is right handed.   Patient drinks 2-3 cups of caffeine per day.    FAMILY HISTORY: Family History  Problem Relation Age of Onset  . Diabetes Mother   . Breast cancer Mother     dx. late 65s-early 82s; "metastasis to throat"?  . Hodgkin's lymphoma Father 48  . Breast cancer Sister     maternal half-sister dx. w/ DCIS in her late 49s;  . Other Sister      learning disabilities; lives on her own  . Alcohol abuse Brother   . Breast cancer Maternal Aunt     dx. 57s  . Ovarian cancer Maternal Aunt 76  . Breast cancer Paternal Aunt     dx. early 48s  . Depression Maternal Grandmother   . Heart Problems Maternal Grandfather   . Stroke Maternal Grandfather   . Heart Problems Paternal Grandmother   . Skin cancer Daughter     dx. early 7s  . Other Daughter     insterstitial cystitis  . Heart Problems Paternal Aunt     ALLERGIES:  is allergic to cymbalta; other; and macrodantin.  MEDICATIONS:  Current Outpatient Prescriptions  Medication Sig Dispense Refill  . aspirin 81 MG tablet  Take 81 mg by mouth daily.    . Biotin 10 MG TABS Take 1 tablet by mouth daily.    . Calcium Carb-Cholecalciferol (CALCIUM + D3) 600-200 MG-UNIT TABS Take 1 tablet by mouth daily.     . calcium carbonate (TUMS - DOSED IN MG ELEMENTAL CALCIUM) 500 MG chewable tablet Chew 1 tablet by mouth daily as needed for indigestion.     . cholecalciferol (VITAMIN D) 1000 UNITS tablet Take 2,000 Units by mouth daily.    Marland Kitchen Co-Enzyme Q10 200 MG CAPS Take 1 capsule by mouth daily.     . Cranberry 200 MG CAPS Take 1 capsule by mouth daily.     . Cyanocobalamin (VITAMIN B-12) 5000 MCG SUBL Place 1 tablet under the tongue daily.    . Esomeprazole Magnesium (NEXIUM PO) Take 1 capsule by mouth daily.    Marland Kitchen exemestane (AROMASIN) 25 MG tablet Take 1 tablet (25 mg total) by mouth daily after breakfast. (Patient not taking: Reported on 03/22/2016) 30 tablet 2  . gabapentin (NEURONTIN) 300 MG capsule One capsule twice during the day and 2 at night 360 capsule 2  . hyaluronate sodium (RADIAPLEXRX) GEL Apply 1 application topically 2 (two) times daily.    . hydrochlorothiazide (MICROZIDE) 12.5 MG capsule Take 1 capsule (12.5 mg total) by mouth daily. 90 capsule 1  . loratadine (CLARITIN) 10 MG tablet Take 10 mg by mouth daily as needed.     Marland Kitchen losartan (COZAAR) 50 MG tablet Take 50 mg by mouth  every morning.     . Multiple Vitamins-Minerals (PRESERVISION AREDS 2) CAPS Take 1 tablet by mouth 2 (two) times daily.     . nebivolol (BYSTOLIC) 5 MG tablet Take 5 mg by mouth daily.    . non-metallic deodorant Jethro Poling) MISC Apply 1 application topically daily as needed.    . rosuvastatin (CRESTOR) 10 MG tablet Take 10 mg by mouth 2 (two) times a week.     No current facility-administered medications for this visit.    REVIEW OF SYSTEMS:   Constitutional: Denies fevers, chills or abnormal night sweats Eyes: Denies blurriness of vision, double vision or watery eyes Ears, nose, mouth, throat, and face: Denies mucositis or sore throat Respiratory: Denies cough, dyspnea or wheezes Cardiovascular: Denies palpitation, chest discomfort or lower extremity swelling Gastrointestinal:  Denies nausea, heartburn or change in bowel habits Skin: Denies abnormal skin rashes Lymphatics: Denies new lymphadenopathy or easy bruising Neurological:Denies numbness, tingling or new weaknesses Behavioral/Psych: Mood is stable, no new changes  All other systems were reviewed with the patient and are negative.  PHYSICAL EXAMINATION: ECOG PERFORMANCE STATUS: 0 - Asymptomatic  Filed Vitals:   04/13/16 1501  BP: 138/72  Pulse: 80  Temp: 98.4 F (36.9 C)  Resp: 18   Filed Weights   04/13/16 1501  Weight: 169 lb 4.8 oz (76.794 kg)    GENERAL:alert, no distress and comfortable SKIN: skin color, texture, turgor are normal, no rashes or significant lesions EYES: normal, conjunctiva are pink and non-injected, sclera clear OROPHARYNX:no exudate, no erythema and lips, buccal mucosa, and tongue normal  NECK: supple, thyroid normal size, non-tender, without nodularity LYMPH:  no palpable lymphadenopathy in the cervical, axillary or inguinal LUNGS: clear to auscultation and percussion with normal breathing effort HEART: regular rate & rhythm and no murmurs and no lower extremity edema ABDOMEN:abdomen soft,  non-tender and normal bowel sounds Musculoskeletal:no cyanosis of digits and no clubbing  PSYCH: alert & oriented x 3 with fluent speech NEURO: no focal  motor/sensory deficits  LABORATORY DATA:  I have reviewed the data as listed CBC Latest Ref Rng 02/17/2016 11/25/2015  WBC 3.9 - 10.3 10e3/uL 6.8 9.0  Hemoglobin 11.6 - 15.9 g/dL 14.5 14.5  Hematocrit 34.8 - 46.6 % 42.6 41.8  Platelets 145 - 400 10e3/uL 328 384    CMP Latest Ref Rng 02/17/2016 12/23/2015 11/25/2015  Glucose 70 - 140 mg/dl 96 119(H) 136(H)  BUN 7.0 - 26.0 mg/dL 12.0 10 8  Creatinine 0.6 - 1.1 mg/dL 0.8 0.83 0.68  Sodium 136 - 145 mEq/L 142 140 139  Potassium 3.5 - 5.1 mEq/L 3.6 3.8 3.4(L)  Chloride 101 - 111 mmol/L - 99(L) 101  CO2 22 - 29 mEq/L _0 Calcium 8.4 - 10.4 mg/dL 9.7 9.5 9.6  Total Protein 6.4 - 8.3 g/dL 7.2 - -  Total Bilirubin 0.20 - 1.20 mg/dL 0.59 - -  Alkaline Phos 40 - 150 U/L 64 - -  AST 5 - 34 U/L 24 - -  ALT 0 - 55 U/L 19 - -     PATHOLOGY REPORT:  Diagnosis 10/25/2015 Breast, left, needle core biopsy - ATYPICAL DUCTAL HYPERPLASIA. Microscopic Comment The findings are called to [facility] on 10/26/2015. Dr. Donato Heinz has seen this case in consultation with agreement. (RH:kh 10/26/2015)   Diagnosis 12/01/2015 Breast, lumpectomy, left - INVASIVE DUCTAL CARCINOMA, SEE COMMENT. - INVASIVE TUMOR IS 8 MM FROM THE NEAREST MARGIN (SUPERIOR) - DUCTAL CARCINOMA IN SITU WITH NECROSIS AND CALCIFICATIONS. - IN SITU CARCINOMA IS 4 MM FROM THE NEAREST MARGIN (SUPERIOR) - PREVIOUS BIOPSY SITE TISSUE CHANGE. - SEE TUMOR SYNOPTIC TEMPLATE BELOW. Microscopic Comment BREAST, INVASIVE TUMOR, WITHOUT LYMPH NODES PRESENT Specimen, including laterality : Left breast without sentinel lymph node sampling Procedure: Lumpectomy Histologic type: Ductal Grade: 2 of 3 Tubule formation: 3 Nuclear pleomorphism: 2 Mitotic: 1 Tumor size (gross measurement): 1.2  cm Margins: Invasive, distance to closest margin: 8 mm In-situ, distance to closest margin: 4 mm If margin positive, focally or broadly: N/A Lymphovascular invasion: Absent Ductal carcinoma in situ: Present Grade: 2 of 3 Extensive intraductal component: Absent Lobular neoplasia: Absent Tumor focality: Unifocal Treatment effect: None If present, treatment effect in breast tissue, lymph nodes or both: N/A Extent of tumor: Skin: N/A Nipple: N/A Skeletal muscle: N/A Breast prognostic profile: Estrogen receptor: Pending and will be reported in an addendum Progesterone receptor: Pending and will be reported in an addendum. Her 2 neu: Pending and will be reported in an addendum Ki-67: Pending and will be reported in an addendum. Non-neoplastic breast: Previous biopsy site tissue change, fibrocystic changes and microcalcifications. TNM: pT1c, pNX, pMX Comments: Multiple recut sections were examined. The absence of the myoepithelial layer was confirmed with smooth muscle myosin heavy chain, Calponin and p63 immunostains (blocks 1A and 1C). (CR:kh 12-05-15)  Results: IMMUNOHISTOCHEMICAL AND MORPHOMETRIC ANALYSIS PERFORMED MANUALLY Estrogen Receptor: 90%, POSITIVE, STRONG STAINING INTENSITY Progesterone Receptor: 20%, POSITIVE, STRONG STAINING INTENSITY Proliferation Marker Ki67: 5%  Results: HER2 - NEGATIVE RATIO OF HER2/CEP17 SIGNALS 1.33 AVERAGE HER2 COPY NUMBER PER CELL 2.85  Diagnosis 12/27/2015 1. Fatty tissue BENIGN FIBROADIPOSE TISSUE 2. Lymph nodes, regional resection, Left axiliary node FIVE BENIGN LYMPH NODES (0/5)  ONCOTYPE Dx RS 7, which predicts 10- year distant recurrence risk of 6% with tamoxifen alone.  RADIOGRAPHIC STUDIES: I have personally reviewed the radiological images as listed and agreed with the findings in the report. No results found.  ASSESSMENT & PLAN:  75 year old Caucasian female, with past  medical history of hypertension, arthritis, presented with  screening discovered left breast cancer.  1. 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  -I reviewed the Oncotype test result, the recurrence score is 7, which is a low risk, she would not benefit from adjuvant chemotherapy.  -Giving the strong ER/PR positivity, I recommend her to have a aromatase inhibitor as adjuvant endocrine therapy to reduce risk of cancer recurrence. Potential side effects, which includes but not limited to, hot flash, skin and vaginal dryness, slightly increased risk of cardiovascular disease and cataract, muscular joint discomfort, osteoporosis, were discussed with her in great details.  She has moderate arthritis, I recommend exemestane. 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 She is willing to try endocrine therapy, but would like to stop if she experiences any significant side effects, especially side effects on her vision. She declined tamoxifen, which her mother took before. -she has completed radiation  -due to the strong ER/PR positivity, I recommend adjuvant aromatase inhibitor for total 5 years. Exemestane was called into her pharmacy a few months ago, she has not started yet, due to the concern of side effects. I again reviewed the benefit and side effects,and alternative option of tamoxifen if she is concerned about muscular joint discomfort from aromatase inhibitor. She expressed good understanding about the above, and wants to try exemestane, since she has already filled the prescription. She'll start in the next few weeks.  2. HTN, arthritis,  -She will continue follow-up with her primary care physician, and exercise  3. Genetics -She has strong family history of breast and ovarian cancer. -She had genetic testing done at [facility] and was negative for BRCA1, BRCA2,  CHECK2, ATM, BARD1, MLH1, MSH2, MSH6, PALB2, PMS2, PTEN, RAD51C, RAD51D, STK11 and TP53 -She is very interested in genetic counseling, and would like to know if she needs additional genetic testing. She has a daughter, she is very concerned about her daughter's risk of breast cancer.  -She was seen by our genetic counselor Audrey. Boggs who did not recommend further more genetic testing.    Plan -she will start exemestane in the next few weeks, she has filled it -I'll see her back in 3-4 months with lab    All questions were answered. The patient knows to call the clinic with any problems, questions or concerns. I spent 25 minutes counseling the patient face to face. The total time spent in the appointment was 30 minutes and more than 50% was on counseling.     Truitt Merle, MD 04/13/2016

## 2016-04-13 NOTE — Telephone Encounter (Signed)
Gave pt apt & avs °

## 2016-04-17 DIAGNOSIS — H2513 Age-related nuclear cataract, bilateral: Secondary | ICD-10-CM | POA: Diagnosis not present

## 2016-04-17 DIAGNOSIS — H353131 Nonexudative age-related macular degeneration, bilateral, early dry stage: Secondary | ICD-10-CM | POA: Diagnosis not present

## 2016-04-17 DIAGNOSIS — H5203 Hypermetropia, bilateral: Secondary | ICD-10-CM | POA: Diagnosis not present

## 2016-04-17 DIAGNOSIS — D3132 Benign neoplasm of left choroid: Secondary | ICD-10-CM | POA: Diagnosis not present

## 2016-05-03 ENCOUNTER — Ambulatory Visit (HOSPITAL_BASED_OUTPATIENT_CLINIC_OR_DEPARTMENT_OTHER): Payer: PPO | Admitting: Nurse Practitioner

## 2016-05-03 ENCOUNTER — Encounter: Payer: Self-pay | Admitting: Nurse Practitioner

## 2016-05-03 VITALS — BP 140/75 | HR 72 | Temp 98.6°F | Resp 18 | Wt 167.8 lb

## 2016-05-03 DIAGNOSIS — C50912 Malignant neoplasm of unspecified site of left female breast: Secondary | ICD-10-CM

## 2016-05-03 DIAGNOSIS — M8580 Other specified disorders of bone density and structure, unspecified site: Secondary | ICD-10-CM

## 2016-05-03 DIAGNOSIS — C50412 Malignant neoplasm of upper-outer quadrant of left female breast: Secondary | ICD-10-CM

## 2016-05-03 NOTE — Progress Notes (Signed)
CLINIC:  Cancer Survivorship   REASON FOR VISIT:  Routine follow-up post-treatment for a recent history of breast cancer.  BRIEF ONCOLOGIC HISTORY:  Oncology History   Breast cancer of upper-outer quadrant of left female breast [facility])   Staging form: Breast, AJCC 7th Edition     Pathologic stage from 11/29/2015: Stage IA (T1c, N0, cM0) - Signed by Truitt Merle, MD on 01/14/2016     Clinical: Stage IA (T1c, N0, M0) - Unsigned        Breast cancer of upper-outer quadrant of left female breast ([place])   07/18/2014 Procedure Hereditary cancer panel: no deleterious mutation at ATM, BARD1, BRCA1, BRCA2, BRIP1, CDH1, CHEK2, EPCAM, MLH1, MSH2, MSH6, NBN, PALB2, PMS2, PTEN, RAD51C, RAD51D, STK11, and TP53.   10/19/2015 Mammogram 1.3 cm irregular focal asymmetry in the left breast   10/25/2015 Initial Biopsy Left breast core needle biopsy showed atypical ductal Hyperplasia   12/01/2015 Definitive Surgery Left breast lumpectomy showed invasive ductal carcinoma, 1.2 cm, grade 2, and DCIS with necrosis and calcification, margins were negative.ER+ (90%), PR+ (20%), HER2/neu negative, Ki67 5%   12/01/2015 Pathologic Stage Stage IA: pT1c pN0   12/01/2015 Oncotype testing RS 7, which predicts 10- year distant recurrence risk of 6% with tamoxifen alone.   12/27/2015 Pathology Results Second surgery with sentinel lymph node biopsy showed no malignant cells in 5 lymph nodes   01/23/2016 - 02/17/2016 Radiation Therapy Adjuvant left Breast radiation: Left breast treated to 42.5 Gy in 17 fractions, Left breast boost treated to 7.5 Gy in 3 fractions     Anti-estrogen oral therapy Exemestane to begin 04/2016    INTERVAL HISTORY:  Audrey [name] presents to the [facility] today for our initial meeting to review her survivorship care plan detailing her treatment course for breast cancer, as well as monitoring long-term side effects of that treatment, education regarding health maintenance, screening, and overall wellness and  health promotion.     Overall, Audrey [name] reports feeling quite well since completing her radiation therapy approximately two and a half months ago.  She continues with mild fatigue but reports that the skin changes overlying her left breast have resolved.  She has some thickening in her left breast following radiation.  She denies headache, cough, or shortness of breath.  She has arthritis and has ongoing complaints of pain in her knees, shoulders, and hands.  She exercises daily (including home yoga).  She has not yet begun the exemestane and is uncertain whether she will due to concern over quality of life.   She has a good appetite and denies any weight loss.  She would like to lose some weight.   REVIEW OF SYSTEMS:  General: Mild fatigue as above. Denies fever, chills, and unintentional weight loss.  HEENT: Denies visual changes, hearing loss, mouth sores or difficulty swallowing. Cardiac: Denies palpitations, chest pain, and lower extremity edema.  Respiratory: Denies wheeze or dyspnea on exertion.  Breast: As above. GI: Denies abdominal pain, constipation, diarrhea, nausea, or vomiting.  GU: Denies dysuria, hematuria, vaginal bleeding, vaginal discharge, or vaginal dryness.  Musculoskeletal: As above. Neuro: Denies recent fall or numbness / tingling in her extremities. Skin: Denies rash, pruritis, or open wounds.  Psych: Denies depression, anxiety, insomnia, or memory loss.   A 14-point review of systems was completed and was negative, except as noted above.   ONCOLOGY TREATMENT TEAM:  1. Surgeon:  Dr. Lucia Gaskins at [facility]  2. Medical Oncologist: Dr. Burr Medico 3. Radiation Oncologist: Dr. Lisbeth Renshaw  PAST MEDICAL/SURGICAL HISTORY:  Past Medical History  Diagnosis Date  . 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 Obesity   . Reflux     buring and dysphagia sx resp to PPI; egd 3/06 neg for Barretts  . Hypertension   . Diastolic  dysfunction   . 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 Complication of anesthesia     difficulty awakening   . PONV (postoperative nausea and vomiting)   . Bladder infection     hx of last one approx 5 years ago   . Yeast infection     hx of   . GERD (gastroesophageal reflux disease)   . Arthritis   . Cancer [facility])     breast  . Breast cancer ([place]) 10/25/15    left breast  ADH   . Allergy    Past Surgical History  Procedure Laterality Date  . Colonoscopy  3/06    NEG  . Lumbar laminectomy    . Belpharoptosis repair Bilateral   . Eye surgery      cosmetic; eyelid surgery bilat   . Left rotator cuff repair      12/2006  . Finger surgery      left index finger/ cyst 02/2007  . Cortisone injections      right arm; back  . Knee arthroscopy Right 04/13/2015    Procedure: ARTHROSCOPY RIGHT KNEE WITH MEDIAL MENISCAL DEBRIDEMENT AND CHONDROPLASTY;  Surgeon: Gaynelle Arabian, MD;  Location: [facility];  Service: Orthopedics;  Laterality: Right;  . Tonsillectomy    . Breast lumpectomy with radioactive seed localization Left 12/01/2015    Procedure: LEFT BREAST [place] RADIOACTIVE SEED LOCALIZATION;  Surgeon: [name], MD;  Location: [place];  Service: General;  Laterality: Left;  . Axillary lymph node biopsy Left 12/27/2015    Procedure: LEFT AXILLARY SENTINEL LYMPH NODE BIOPSY;  Surgeon: [name], MD;  Location: [place];  Service: General;  Laterality: Left;     ALLERGIES:  Allergies  Allergen Reactions  . Cymbalta [Duloxetine Hcl]     Diarrhea  . Other     Dermabond & Cat gut  . Macrodantin [Nitrofurantoin] Rash     CURRENT MEDICATIONS:  Current Outpatient Prescriptions on File Prior to Visit  Medication Sig Dispense Refill  . aspirin 81 MG tablet Take 81 mg by mouth daily.    . Biotin 10 MG TABS Take 1 tablet by mouth daily.    . Calcium Carb-Cholecalciferol (CALCIUM + D3) 600-200 MG-UNIT TABS Take 1 tablet by mouth daily.     .  calcium carbonate (TUMS - DOSED IN MG ELEMENTAL CALCIUM) 500 MG chewable tablet Chew 1 tablet by mouth daily as needed for indigestion.     . cholecalciferol (VITAMIN D) 1000 UNITS tablet Take 2,000 Units by mouth daily.    Marland Kitchen Co-Enzyme Q10 200 MG CAPS Take 1 capsule by mouth daily.     . Cranberry 200 MG CAPS Take 1 capsule by mouth daily.     . Cyanocobalamin (VITAMIN B-12) 5000 MCG SUBL Place 1 tablet under the tongue daily.    . Esomeprazole Magnesium (NEXIUM PO) Take 1 capsule by mouth daily.    Marland Kitchen gabapentin (NEURONTIN) 300 MG capsule One capsule twice during the day and 2 at night 360 capsule 2  . hydrochlorothiazide (MICROZIDE) 12.5 MG capsule Take 1 capsule (12.5 mg total) by mouth daily. 90 capsule 1  . Krill Oil 300 MG  CAPS Take 1 capsule by mouth daily.    Marland Kitchen losartan (COZAAR) 50 MG tablet Take 50 mg by mouth every morning.     . Multiple Vitamins-Minerals (PRESERVISION AREDS 2) CAPS Take 1 tablet by mouth 2 (two) times daily.     . nebivolol (BYSTOLIC) 5 MG tablet Take 5 mg by mouth daily.    . non-metallic deodorant Jethro Poling) MISC Apply 1 application topically daily as needed.    . rosuvastatin (CRESTOR) 10 MG tablet Take 10 mg by mouth 2 (two) times a week.    . loratadine (CLARITIN) 10 MG tablet Take 10 mg by mouth daily as needed. Reported on 05/03/2016     No current facility-administered medications on file prior to visit.     ONCOLOGIC FAMILY HISTORY:  Family History  Problem Relation Age of Onset  . Diabetes Mother   . Breast cancer Mother     dx. late 35s-early 79s; "metastasis to throat"?  . Hodgkin's lymphoma Father 72  . Breast cancer Sister     maternal half-sister dx. w/ DCIS in her late 34s;  . Other Sister     learning disabilities; lives on her own  . Alcohol abuse Brother   . Breast cancer Maternal Aunt     dx. 65s  . Ovarian cancer Maternal Aunt 54  . Breast cancer Paternal Aunt     dx. early 55s  . Depression Maternal Grandmother   . Heart Problems  Maternal Grandfather   . Stroke Maternal Grandfather   . Heart Problems Paternal Grandmother   . Skin cancer Daughter     dx. early 59s  . Other Daughter     insterstitial cystitis  . Heart Problems Paternal Aunt      GENETIC COUNSELING/TESTING: Yes, performed 07/18/2014: Hereditary cancer panel: no deleterious mutation at ATM, BARD1, BRCA1, BRCA2, BRIP1, CDH1, CHEK2, EPCAM, MLH1, MSH2, MSH6, NBN, PALB2, PMS2, PTEN, RAD51C, RAD51D, STK11, and TP53.   SOCIAL HISTORY:  SOSHA SHEPHERD is married and lives with her spouse in [place], [place].  She has 2 children. Ms. Gavin is currently retired.  She denies any current or history of tobacco, alcohol, or illicit drug use.     PHYSICAL EXAMINATION:  Vital Signs: Filed Vitals:   05/03/16 1400  BP: 140/75  Pulse: 72  Temp: 98.6 F (37 C)  Resp: 18   ECOG Performance Status: 0  General: Well-nourished, well-appearing female in no acute distress.  She is unaccompanied in clinic today.   HEENT: Head is atraumatic and normocephalic.  Pupils equal and reactive to light and accomodation. Conjunctivae clear without exudate.  Sclerae anicteric. Oral mucosa is pink, moist, and intact without lesions.  Oropharynx is pink without lesions or erythema.  Lymph: No cervical, supraclavicular, infraclavicular, or axillary lymphadenopathy noted on palpation.  Cardiovascular: Regular rate and rhythm without murmurs, rubs, or gallops. Respiratory: Clear to auscultation bilaterally. Chest expansion symmetric without accessory muscle use on inspiration or expiration.  GI: Abdomen soft and round. No tenderness to palpation. Bowel sounds normoactive in 4 quadrants.  GU: Deferred.  Neuro: No focal deficits. Steady gait.  Psych: Mood and affect normal and appropriate for situation.  Extremities: No edema, cyanosis, or clubbing.  Skin: Warm and dry. No open lesions noted.   LABORATORY DATA:  None for this visit.  DIAGNOSTIC IMAGING:  None for  this visit.     ASSESSMENT AND PLAN:   1. 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 Ms. Jans is doing well without clinical symptoms worrisome for disease recurrence. She will follow-up with her medical oncologist,  Dr. Burr Medico, in September 2017 with history and physical examination per surveillance protocol after seeing Dr. Lucia Gaskins in August 2017.   She will continue to consider initiating her anti-estrogen therapy and we have discussed this in detail today. A comprehensive survivorship care plan and treatment summary was reviewed with the patient today detailing her breast cancer diagnosis, treatment course, potential late/long-term effects of treatment, appropriate follow-up care with recommendations for the future, and patient education resources.  A copy of this summary, along with a letter will be sent to the patient's primary care provider via in basket message after today's visit.  Ms. Helbling is welcome to return to the Survivorship Clinic in the future, as needed; no follow-up will be scheduled at this time.    2. Bone health:  Given Ms. Marxen's age/history of breast cancer and her current treatment regimen including endocrine therapy with exemestane, she is at risk for bone demineralization.  Per our records, her last DEXA scan was performed 06/2012 revealing osteopenia. We will continue to monitor this closely while she is on endocrine therapy.  In the meantime, she was encouraged to increase her consumption of foods rich in calcium and vitamin D as well as to increase her weight-bearing activities.  She was given education on specific activities to promote bone health.  3. Cancer screening:  Due to Ms. Janes's history and her age, she should receive  screening for skin cancers, colon cancer, and gynecologic cancers.  The information and recommendations are listed on the patient's comprehensive care plan/treatment summary and were reviewed in detail with the patient.    4. Health maintenance and wellness promotion: Ms. Barbe was encouraged to consume 5-7 servings of fruits and vegetables per day. We reviewed the "Nutrition Rainbow" handout, as well as discussed recommendations to maximize nutrition and minimize recurrence, such as increased intake of fruits, vegetables, lean proteins, and minimizing the intake of red meats and processed foods.  She was also encouraged to engage in moderate to vigorous exercise for 30 minutes per day most days of the week. We discussed the [employer] fitness program, which is designed for cancer survivors to help them become more physically fit after cancer treatments.  She was instructed to limit her alcohol consumption and continue to abstain from tobacco use.  A copy of the "Take Control of Your Health" brochure was given to her reinforcing these recommendations.   5. Support services/counseling: It is not uncommon for this period of the patient's cancer care trajectory to be one of many emotions and stressors.  We discussed an opportunity for her to participate in the next session of [facility] ("Finding Your New Normal") support group series designed for patients after they have completed treatment.  Ms. Rinck was encouraged to take advantage of our many other support services programs, support groups, and/or counseling in coping with her new life as a cancer survivor after completing anti-cancer treatment.  She was offered support today through active listening and expressive supportive counseling.  She was given information regarding our available services and encouraged to contact me with any questions or for help enrolling in any of our support group/programs.    A total of 60 minutes of face-to-face time was  spent with this patient  with greater than 50% of that time in counseling and care-coordination.   Sylvan Cheese, NP  Survivorship Program [place] [phone]   Note: PRIMARY CARE PROVIDER [name], [place] [phone]

## 2016-05-14 ENCOUNTER — Encounter: Payer: Self-pay | Admitting: Nurse Practitioner

## 2016-06-05 ENCOUNTER — Encounter: Payer: Self-pay | Admitting: Adult Health

## 2016-06-05 ENCOUNTER — Ambulatory Visit (INDEPENDENT_AMBULATORY_CARE_PROVIDER_SITE_OTHER): Payer: PPO | Admitting: Adult Health

## 2016-06-05 VITALS — BP 122/72 | HR 86 | Resp 20 | Ht 59.0 in | Wt 164.0 lb

## 2016-06-05 DIAGNOSIS — G609 Hereditary and idiopathic neuropathy, unspecified: Secondary | ICD-10-CM | POA: Diagnosis not present

## 2016-06-05 DIAGNOSIS — R269 Unspecified abnormalities of gait and mobility: Secondary | ICD-10-CM | POA: Insufficient documentation

## 2016-06-05 MED ORDER — GABAPENTIN 300 MG PO CAPS: ORAL_CAPSULE | ORAL | Status: DC

## 2016-06-05 NOTE — Progress Notes (Signed)
PATIENT: Audrey [name] DOB: 02-28-1941  REASON FOR VISIT: follow up- peripheral neuropathy HISTORY FROM: patient  HISTORY OF PRESENT ILLNESS: Audrey [name] is a 75 year old female with a history of peripheral neuropathy. She returns today for follow-up. At the last visit gabapentin was increased to 300 mg in the morning, noon and 600 mg in the evening. She reports that this has been very beneficial. She is sleeping well with this increase. Denies any significant discomfort in the lower extremities. She reports every now and then she will have a "creepy crawly sensations on the legs." She reports this has only happened 3-4 times. She denies any changes with her gait or balance. She states that she is participating in yoga and has noticed positive changes with her balance. She denies any new neurological symptoms. Returns today for an evaluation.  HISTORY 12/06/15 ([place]): Audrey [name] is a 75 year old right-handed white female with a history of a peripheral neuropathy documented by EMG and nerve conduction study. The patient recently has been diagnosed with early breast cancer. The patient is been on gabapentin taking 300 mg 3 times daily or her neuropathy, initially with excellent benefit. The patient comes back in with recent onset of discomfort that she claims is mainly within the knee joints bilaterally and in the joints of the hands bilaterally. When she gets up out of a chair, she will have significant pain in the knees, and when she starts to walk, the pain will lessen. The patient also indicates that she may have some discomfort in the joints of the hands with use of the hands. The patient does have some neck stiffness as well. The patient continues to be actively involved with yoga , she has difficulty performing some of the tasks with yoga that involved balance. The patient denies any falls. In the past, she has not then able to tolerate arthritis medications well. She returns to this office  for an evaluation.  REVIEW OF SYSTEMS: Out of a complete 14 system review of symptoms, the patient complains only of the following symptoms, and all other reviewed systems are negative.  See history of present illness   ALLERGIES: Allergies  Allergen Reactions  . Cymbalta [Duloxetine Hcl]     Diarrhea  . Other     Dermabond & Cat gut  . Macrodantin [Nitrofurantoin] Rash    HOME MEDICATIONS: Outpatient Prescriptions Prior to Visit  Medication Sig Dispense Refill  . aspirin 81 MG tablet Take 81 mg by mouth daily.    . Biotin 10 MG TABS Take 1 tablet by mouth daily.    . Calcium Carb-Cholecalciferol (CALCIUM + D3) 600-200 MG-UNIT TABS Take 1 tablet by mouth daily.     . calcium carbonate (TUMS - DOSED IN MG ELEMENTAL CALCIUM) 500 MG chewable tablet Chew 1 tablet by mouth daily as needed for indigestion.     . cholecalciferol (VITAMIN D) 1000 UNITS tablet Take 2,000 Units by mouth daily.    Marland Kitchen Co-Enzyme Q10 200 MG CAPS Take 1 capsule by mouth daily.     . Cranberry 200 MG CAPS Take 1 capsule by mouth daily.     . Cyanocobalamin (VITAMIN B-12) 5000 MCG SUBL Place 1 tablet under the tongue daily.    . Esomeprazole Magnesium (NEXIUM PO) Take 1 capsule by mouth daily.    Marland Kitchen gabapentin (NEURONTIN) 300 MG capsule One capsule twice during the day and 2 at night 360 capsule 2  . hydrochlorothiazide (MICROZIDE) 12.5 MG capsule Take 1 capsule (12.5 mg  total) by mouth daily. 90 capsule 1  . Krill Oil 300 MG CAPS Take 1 capsule by mouth daily.    Marland Kitchen loratadine (CLARITIN) 10 MG tablet Take 10 mg by mouth daily as needed. Reported on 05/03/2016    . losartan (COZAAR) 50 MG tablet Take 50 mg by mouth every morning.     . Multiple Vitamins-Minerals (PRESERVISION AREDS 2) CAPS Take 1 tablet by mouth 2 (two) times daily.     . nebivolol (BYSTOLIC) 5 MG tablet Take 5 mg by mouth daily.    . non-metallic deodorant Jethro Poling) MISC Apply 1 application topically daily as needed.    . rosuvastatin (CRESTOR) 10 MG  tablet Take 10 mg by mouth 2 (two) times a week.     No facility-administered medications prior to visit.    PAST MEDICAL HISTORY: Past Medical History  Diagnosis Date  . 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 Obesity   . Reflux     buring and dysphagia sx resp to PPI; egd 3/06 neg for Barretts  . Hypertension   . Diastolic dysfunction   . 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 Complication of anesthesia     difficulty awakening   . PONV (postoperative nausea and vomiting)   . Bladder infection     hx of last one approx 5 years ago   . Yeast infection     hx of   . GERD (gastroesophageal reflux disease)   . Arthritis   . Cancer [facility])     breast  . Breast cancer ([place]) 10/25/15    left breast  ADH   . Allergy     PAST SURGICAL HISTORY: Past Surgical History  Procedure Laterality Date  . Colonoscopy  3/06    NEG  . Lumbar laminectomy    . Belpharoptosis repair Bilateral   . Eye surgery      cosmetic; eyelid surgery bilat   . Left rotator cuff repair      12/2006  . Finger surgery      left index finger/ cyst 02/2007  . Cortisone injections      right arm; back  . Knee arthroscopy Right 04/13/2015    Procedure: ARTHROSCOPY RIGHT KNEE WITH MEDIAL MENISCAL DEBRIDEMENT AND CHONDROPLASTY;  Surgeon: Gaynelle Arabian, MD;  [facility];  Service: Orthopedics;  Laterality: Right;  . Tonsillectomy    . Breast lumpectomy with radioactive seed localization Left 12/01/2015    Procedure: LEFT BREAST [place] RADIOACTIVE SEED LOCALIZATION;  Surgeon: [name], MD;  Location: [place];  Service: General;  Laterality: Left;  . Axillary lymph node biopsy Left 12/27/2015    Procedure: LEFT AXILLARY SENTINEL LYMPH NODE BIOPSY;  Surgeon: [name], MD;  Location: [place];  Service: General;  Laterality: Left;    FAMILY HISTORY: Family History  Problem Relation Age of Onset  . Diabetes Mother     . Breast cancer Mother     dx. late 57s-early 61s; "metastasis to throat"?  . Hodgkin's lymphoma Father 56  . Breast cancer Sister     maternal half-sister dx. w/ DCIS in her late 100s;  . Other Sister     learning disabilities; lives on her own  . Alcohol abuse Brother   . Breast cancer Maternal Aunt     dx. 15s  . Ovarian cancer Maternal Aunt 33  . Breast cancer Paternal Aunt     dx. early 77s  .  Depression Paternal Grandmother   . Heart Problems Paternal Grandmother   . Stroke Maternal Grandfather   . Heart Problems Paternal Grandmother   . Skin cancer Daughter     dx. early 49s  . Other Daughter     insterstitial cystitis  . Heart Problems Paternal Aunt     SOCIAL HISTORY: Social History   Social History  . Marital Status: Married    Spouse Name: N/A  . Number of Children: 2  . Years of Education: 13   Occupational History  . retired    Social History Main Topics  . Smoking status: Never Smoker   . Smokeless tobacco: Never Used  . Alcohol Use: No  . Drug Use: No  . Sexual Activity: Not on file   Other Topics Concern  . Not on file   Social History Narrative   Patient is right handed.   Patient drinks 2-3 cups of caffeine per day.      PHYSICAL EXAM  Filed Vitals:   06/05/16 1528  BP: 122/72  Pulse: 86  Resp: 20  Height: 4\' 11"  (1.499 m)  Weight: 164 lb (74.39 kg)   Body mass index is 33.11 kg/(m^2).  Generalized: Well developed, in no acute distress   Neurological examination  Mentation: Alert oriented to time, place, history taking. Follows all commands speech and language fluent Cranial nerve II-XII: Pupils were equal round reactive to light. Extraocular movements were full, visual field were full on confrontational test. Facial sensation and strength were normal. Uvula tongue midline. Head turning and shoulder shrug  were normal and symmetric. Motor: The motor testing reveals 5 over 5 strength of all 4 extremities. Good symmetric motor  tone is noted throughout.  Sensory: Sensory testing is intact to soft touchAnd pinprick  on all 4 extremities. No evidence of extinction is noted.  Coordination: Cerebellar testing reveals good finger-nose-finger and heel-to-shin bilaterally.  Gait and station: Gait is normal. Tandem gait is normal. Romberg is negative. No drift is seen.  Reflexes: Deep tendon reflexes are symmetric and normal bilaterally.   DIAGNOSTIC DATA (LABS, IMAGING, TESTING) - I reviewed patient records, labs, notes, testing and imaging myself where available.  Lab Results  Component Value Date   WBC 6.8 02/17/2016   HGB 14.5 02/17/2016   HCT 42.6 02/17/2016   MCV 87.5 02/17/2016   PLT 328 02/17/2016      Component Value Date/Time   NA 142 02/17/2016 1542   NA 140 12/23/2015 1500   K 3.6 02/17/2016 1542   K 3.8 12/23/2015 1500   CL 99* 12/23/2015 1500   CO2 29 02/17/2016 1542   CO2 29 12/23/2015 1500   GLUCOSE 96 02/17/2016 1542   GLUCOSE 119* 12/23/2015 1500   BUN 12.0 02/17/2016 1542   BUN 10 12/23/2015 1500   CREATININE 0.8 02/17/2016 1542   CREATININE 0.83 12/23/2015 1500   CALCIUM 9.7 02/17/2016 1542   CALCIUM 9.5 12/23/2015 1500   PROT 7.2 02/17/2016 1542   PROT 7.1 02/02/2015 1556   ALBUMIN 3.9 02/17/2016 1542   AST 24 02/17/2016 1542   ALT 19 02/17/2016 1542   ALKPHOS 64 02/17/2016 1542   BILITOT 0.59 02/17/2016 1542   GFRNONAA >60 12/23/2015 1500   GFRAA >60 12/23/2015 1500     ASSESSMENT AND PLAN 75 y.o. year old female  has a past medical history of Tachycardia ( ); Obesity; Reflux; Hypertension; Diastolic dysfunction; Hereditary and idiopathic peripheral neuropathy (02/10/2015); Complication of anesthesia; PONV (postoperative nausea and vomiting); Bladder infection;  Yeast infection; GERD (gastroesophageal reflux disease); Arthritis; Cancer ([place]); Breast cancer ([place]) (10/25/15); and Allergy. here with:  1. Peripheral neuropathy  Overall the patient is doing well. She has noticed  good benefit with gabapentin. She will continue taking 300 mg in the morning, noon and 600 mg in the evening. Patient advised that if her symptoms worsen or she develops any new symptoms she should let us know. She will follow-up in 6 months or sooner if needed.  Ward Givens, MSN, NP-C 06/05/2016, 3:37 PM [facility] [address] [place], [place] [address] [phone]

## 2016-06-05 NOTE — Patient Instructions (Signed)
Continue gabapentin  If your symptoms worsen or you develop new symptoms please let us know.

## 2016-06-05 NOTE — Progress Notes (Signed)
I have read the note, and I agree with the clinical assessment and plan.  Avik Leoni KEITH   

## 2016-07-12 DIAGNOSIS — C50912 Malignant neoplasm of unspecified site of left female breast: Secondary | ICD-10-CM | POA: Diagnosis not present

## 2016-07-12 DIAGNOSIS — Z006 Encounter for examination for normal comparison and control in clinical research program: Secondary | ICD-10-CM | POA: Diagnosis not present

## 2016-07-19 ENCOUNTER — Other Ambulatory Visit: Payer: Self-pay | Admitting: Neurology

## 2016-07-26 DIAGNOSIS — D2272 Melanocytic nevi of left lower limb, including hip: Secondary | ICD-10-CM | POA: Diagnosis not present

## 2016-07-26 DIAGNOSIS — D225 Melanocytic nevi of trunk: Secondary | ICD-10-CM | POA: Diagnosis not present

## 2016-07-26 DIAGNOSIS — L821 Other seborrheic keratosis: Secondary | ICD-10-CM | POA: Diagnosis not present

## 2016-08-03 ENCOUNTER — Ambulatory Visit (HOSPITAL_BASED_OUTPATIENT_CLINIC_OR_DEPARTMENT_OTHER): Payer: PPO | Admitting: Hematology

## 2016-08-03 ENCOUNTER — Other Ambulatory Visit (HOSPITAL_BASED_OUTPATIENT_CLINIC_OR_DEPARTMENT_OTHER): Payer: PPO

## 2016-08-03 ENCOUNTER — Telehealth: Payer: Self-pay | Admitting: Hematology

## 2016-08-03 VITALS — BP 130/65 | HR 74 | Temp 98.2°F | Resp 18 | Ht 59.0 in | Wt 169.0 lb

## 2016-08-03 DIAGNOSIS — I10 Essential (primary) hypertension: Secondary | ICD-10-CM

## 2016-08-03 DIAGNOSIS — N644 Mastodynia: Secondary | ICD-10-CM | POA: Diagnosis not present

## 2016-08-03 DIAGNOSIS — R109 Unspecified abdominal pain: Secondary | ICD-10-CM

## 2016-08-03 DIAGNOSIS — C50412 Malignant neoplasm of upper-outer quadrant of left female breast: Secondary | ICD-10-CM | POA: Diagnosis not present

## 2016-08-03 DIAGNOSIS — R197 Diarrhea, unspecified: Secondary | ICD-10-CM

## 2016-08-03 DIAGNOSIS — M1990 Unspecified osteoarthritis, unspecified site: Secondary | ICD-10-CM

## 2016-08-03 LAB — COMPREHENSIVE METABOLIC PANEL
ALBUMIN: 3.7 g/dL (ref 3.5–5.0)
ALK PHOS: 69 U/L (ref 40–150)
ALT: 19 U/L (ref 0–55)
AST: 26 U/L (ref 5–34)
Anion Gap: 11 mEq/L (ref 3–11)
BILIRUBIN TOTAL: 0.52 mg/dL (ref 0.20–1.20)
BUN: 10.9 mg/dL (ref 7.0–26.0)
CO2: 28 mEq/L (ref 22–29)
Calcium: 9.4 mg/dL (ref 8.4–10.4)
Chloride: 100 mEq/L (ref 98–109)
Creatinine: 0.9 mg/dL (ref 0.6–1.1)
EGFR: 64 mL/min/{1.73_m2} — ABNORMAL LOW (ref >90)
GLUCOSE: 112 mg/dL (ref 70–140)
Potassium: 3.6 mEq/L (ref 3.5–5.1)
SODIUM: 140 meq/L (ref 136–145)
TOTAL PROTEIN: 7.1 g/dL (ref 6.4–8.3)

## 2016-08-03 LAB — CBC WITH DIFFERENTIAL/PLATELET
BASO%: 0.7 % (ref 0.0–2.0)
Basophils Absolute: 0.1 10*3/uL (ref 0.0–0.1)
EOS ABS: 0.2 10*3/uL (ref 0.0–0.5)
EOS%: 1.9 % (ref 0.0–7.0)
HCT: 42.2 % (ref 34.8–46.6)
HEMOGLOBIN: 14.8 g/dL (ref 11.6–15.9)
LYMPH%: 20.1 % (ref 14.0–49.7)
MCH: 30.9 pg (ref 25.1–34.0)
MCHC: 35.2 g/dL (ref 31.5–36.0)
MCV: 87.9 fL (ref 79.5–101.0)
MONO#: 0.6 10*3/uL (ref 0.1–0.9)
MONO%: 7.2 % (ref 0.0–14.0)
NEUT%: 70.1 % (ref 38.4–76.8)
NEUTROS ABS: 5.8 10*3/uL (ref 1.5–6.5)
Platelets: 345 10*3/uL (ref 145–400)
RBC: 4.8 10*6/uL (ref 3.70–5.45)
RDW: 13.2 % (ref 11.2–14.5)
WBC: 8.2 10*3/uL (ref 3.9–10.3)
lymph#: 1.7 10*3/uL (ref 0.9–3.3)

## 2016-08-03 NOTE — Progress Notes (Signed)
[facility]  Telephone:([phone]) [phone] [phone]  Clinic follow Up Note   Patient Care Team: [name], MD as PCP - General (Internal Medicine) Sueanne Margarita, MD as Consulting Physician (Cardiology) Jodi Marble, MD as Consulting Physician (Otolaryngology) [name], MD as Consulting Physician (General Surgery) Truitt Merle, MD as Consulting Physician (Hematology) Kyung Rudd, MD as Consulting Physician (Radiation Oncology) Sylvan Cheese, NP as Nurse Practitioner (Hematology and Oncology) Aloha Gell, MD as Consulting Physician (Obstetrics and Gynecology) 08/03/2016   CHIEF COMPLAINTS:  Follow up left breast cancer   Oncology History   Breast cancer of upper-outer quadrant of left female breast [facility])   Staging form: Breast, AJCC 7th Edition     Pathologic stage from 11/29/2015: Stage IA (T1c, N0, cM0) - Signed by Truitt Merle, MD on 01/14/2016     Clinical: Stage IA (T1c, N0, M0) - Unsigned        Breast cancer of upper-outer quadrant of left female breast ([place])   07/18/2014 Procedure    Hereditary cancer panel: no deleterious mutation at ATM, BARD1, BRCA1, BRCA2, BRIP1, CDH1, CHEK2, EPCAM, MLH1, MSH2, MSH6, NBN, PALB2, PMS2, PTEN, RAD51C, RAD51D, STK11, and TP53.      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      12/27/2015 Pathology Results    Second surgery with sentinel lymph node biopsy showed no malignant cells  in 5 lymph nodes      01/23/2016 - 02/17/2016 Radiation Therapy    Adjuvant left Breast radiation: Left breast treated to 42.5 Gy in 17 fractions, Left breast boost treated to 7.5 Gy in 3 fractions        Anti-estrogen oral therapy    She declined adjuvant AI or tamoxifen       05/03/2016 Survivorship    SCP visit completed       HISTORY OF PRESENTING ILLNESS:  Audrey [name] 75 y.o. female is here because of her recently diagnosed left breast cancer.  This was dicovered by screening mammogram on 10/13/2015, which showed a 1.3 cm irregular focal asymmetry in the 1:00 position of left breast. She underwent diagnostic mammogram and ultrasound and core needle biopsy, which showed atypical ductal hyperplasia. She was referred to breast surgeon Dr. Lucia Gaskins. She underwent left breast lumpectomy on 11/30/2014, no sentinel lymph nodes. 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  She has recovered very well, she took a few tablets of Tylenol after surgery, the pain has resolved. She is back to her normal exercise routine without any issues.  She has arthritis, especially knee, shoudler and hands, takes Aleve evarage once a day. She is fairly active, exercise daily at home, including yaga and treadmill. She has good appetite and eating well, no recent weight change. No other complaints.   GYN HISTORY  Menarchal: 13 LMP: 1991 Contraceptive: no  HRT: no  G2P2: (+) breast  feeding   CURRENT THERAPY:  Surveillance  INTERIM HISTORY [name] turns for follow-up. She has decided not to take adjuvant endocrine therapy due to the concerns of side effects. She report intermittent low abdominal pain for the past few months, and had a few episodes of diarrhea. She is not sure if she caught virus infection. She also complains of left breast pain, most are shooting pain and resolves in seconds. She Otherwise feels well, has good appetite and energy level, denies any other pain, dyspnea, or  other symptoms.  MEDICAL HISTORY:  Past Medical History:  Diagnosis Date  . Allergy   . Arthritis   . Bladder infection    hx of last one approx 5 years ago   . Breast cancer ([place]) 10/25/15   left breast  ADH   . Cancer ([place])    breast  . Complication of anesthesia    difficulty awakening   . Diastolic dysfunction   . GERD (gastroesophageal reflux disease)   . 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 Hypertension   . Obesity   . PONV (postoperative nausea and vomiting)   . Reflux    buring and dysphagia sx resp to PPI; egd 3/06 neg for Barretts  . 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 Yeast infection    hx of     SURGICAL HISTORY: Past Surgical History:  Procedure Laterality Date  . AXILLARY LYMPH NODE BIOPSY Left 12/27/2015   Procedure: LEFT AXILLARY SENTINEL LYMPH NODE BIOPSY;  Surgeon: [name], MD;  Location: [place];  Service: General;  Laterality: Left;  . BELPHAROPTOSIS REPAIR Bilateral   . BREAST LUMPECTOMY WITH RADIOACTIVE SEED LOCALIZATION Left 12/01/2015   Procedure: LEFT BREAST [place] RADIOACTIVE SEED LOCALIZATION;  Surgeon: [name], MD;  Location: [place];  Service: General;  Laterality: Left;  . COLONOSCOPY  3/06   NEG  . cortisone injections     right arm; back  . EYE SURGERY     cosmetic; eyelid surgery bilat   . FINGER SURGERY     left index finger/ cyst 02/2007  . KNEE ARTHROSCOPY Right 04/13/2015   Procedure: ARTHROSCOPY RIGHT KNEE WITH MEDIAL MENISCAL DEBRIDEMENT AND CHONDROPLASTY;  Surgeon: Gaynelle Arabian, MD;  Location: [facility];  Service: Orthopedics;  Laterality: Right;  . left rotator cuff repair     12/2006  . LUMBAR LAMINECTOMY    . TONSILLECTOMY      SOCIAL HISTORY: Social History   Social History  . Marital Status: Married    Spouse Name: N/A  . Number of Children: 2, daughter 57, son 107  . Years of Education: 13   Occupational History  .  retired    Social History Main Topics  . Smoking status: Never Smoker   . Smokeless tobacco: Never Used  . Alcohol Use: No  . Drug Use: No  . Sexual Activity: Not on file   Other Topics Concern  . Not on file   Social History Narrative   Patient is right handed.   Patient drinks 2-3 cups of caffeine per day.    FAMILY HISTORY: Family History  Problem Relation Age of Onset  . Diabetes Mother   . Breast cancer Mother     dx. late 43s-early 70s; "metastasis to throat"?  . Hodgkin's lymphoma Father 78  . Breast cancer Sister     maternal half-sister dx. w/ DCIS in her late 52s;  . Other Sister  learning disabilities; lives on her own  . Alcohol abuse Brother   . Breast cancer Maternal Aunt     dx. 17s  . Ovarian cancer Maternal Aunt 49  . Breast cancer Paternal Aunt     dx. early 21s  . Depression Paternal Grandmother   . Heart Problems Paternal Grandmother   . Stroke Maternal Grandfather   . Heart Problems Paternal Grandmother   . Skin cancer Daughter     dx. early 102s  . Other Daughter     insterstitial cystitis  . Heart Problems Paternal Aunt     ALLERGIES:  is allergic to cymbalta [duloxetine hcl]; other; and macrodantin [nitrofurantoin].  MEDICATIONS:  Current Outpatient Prescriptions  Medication Sig Dispense Refill  . aspirin 81 MG tablet Take 81 mg by mouth daily.    . Biotin 10 MG TABS Take 1 tablet by mouth daily.    . Calcium Carb-Cholecalciferol (CALCIUM + D3) 600-200 MG-UNIT TABS Take 1 tablet by mouth daily.     . calcium carbonate (TUMS - DOSED IN MG ELEMENTAL CALCIUM) 500 MG chewable tablet Chew 1 tablet by mouth daily as needed for indigestion.     . cholecalciferol (VITAMIN D) 1000 UNITS tablet Take 2,000 Units by mouth daily.    Marland Kitchen Co-Enzyme Q10 200 MG CAPS Take 1 capsule by mouth daily.     . Cranberry 200 MG CAPS Take 1 capsule by mouth daily.     . Cyanocobalamin (VITAMIN B-12) 5000 MCG SUBL Place 1 tablet under the tongue daily.    .  Esomeprazole Magnesium (NEXIUM PO) Take 1 capsule by mouth daily.    Marland Kitchen gabapentin (NEURONTIN) 300 MG capsule One capsule twice during the day and 2 at night 360 capsule 3  . hydrochlorothiazide (MICROZIDE) 12.5 MG capsule Take 1 capsule (12.5 mg total) by mouth daily. 90 capsule 1  . Krill Oil 300 MG CAPS Take 1 capsule by mouth daily.    Marland Kitchen loratadine (CLARITIN) 10 MG tablet Take 10 mg by mouth daily as needed. Reported on 05/03/2016    . losartan (COZAAR) 50 MG tablet Take 50 mg by mouth every morning.     . Multiple Vitamins-Minerals (PRESERVISION AREDS 2) CAPS Take 1 tablet by mouth 2 (two) times daily.     . nebivolol (BYSTOLIC) 5 MG tablet Take 5 mg by mouth daily.    . non-metallic deodorant Jethro Poling) MISC Apply 1 application topically daily as needed.    . rosuvastatin (CRESTOR) 10 MG tablet Take 10 mg by mouth 2 (two) times a week.     No current facility-administered medications for this visit.     REVIEW OF SYSTEMS:   Constitutional: Denies fevers, chills or abnormal night sweats Eyes: Denies blurriness of vision, double vision or watery eyes Ears, nose, mouth, throat, and face: Denies mucositis or sore throat Respiratory: Denies cough, dyspnea or wheezes Cardiovascular: Denies palpitation, chest discomfort or lower extremity swelling Gastrointestinal:  Denies nausea, heartburn or change in bowel habits Skin: Denies abnormal skin rashes Lymphatics: Denies new lymphadenopathy or easy bruising Neurological:Denies numbness, tingling or new weaknesses Behavioral/Psych: Mood is stable, no new changes  All other systems were reviewed with the patient and are negative.  PHYSICAL EXAMINATION: ECOG PERFORMANCE STATUS: 0 - Asymptomatic  Vitals:   08/03/16 1523  BP: 130/65  Pulse: 74  Resp: 18  Temp: 98.2 F (36.8 C)   Filed Weights   08/03/16 1523  Weight: 169 lb (76.7 kg)    GENERAL:alert, no distress and comfortable  SKIN: skin color, texture, turgor are normal, no rashes or  significant lesions EYES: normal, conjunctiva are pink and non-injected, sclera clear OROPHARYNX:no exudate, no erythema and lips, buccal mucosa, and tongue normal  NECK: supple, thyroid normal size, non-tender, without nodularity LYMPH:  no palpable lymphadenopathy in the cervical, axillary or inguinal LUNGS: clear to auscultation and percussion with normal breathing effort HEART: regular rate & rhythm and no murmurs and no lower extremity edema ABDOMEN:abdomen soft, non-tender and normal bowel sounds Musculoskeletal:no cyanosis of digits and no clubbing  PSYCH: alert & oriented x 3 with fluent speech NEURO: no focal motor/sensory deficits  LABORATORY DATA:  I have reviewed the data as listed CBC Latest Ref Rng & Units 08/03/2016 02/17/2016 11/25/2015  WBC 3.9 - 10.3 10e3/uL 8.2 6.8 9.0  Hemoglobin 11.6 - 15.9 g/dL 14.8 14.5 14.5  Hematocrit 34.8 - 46.6 % 42.2 42.6 41.8  Platelets 145 - 400 10e3/uL 345 328 384    CMP Latest Ref Rng & Units 08/03/2016 02/17/2016 12/23/2015  Glucose 70 - 140 mg/dl 112 96 119(H)  BUN 7.0 - 26.0 mg/dL 10.9 12.0 10  Creatinine 0.6 - 1.1 mg/dL 0.9 0.8 0.83  Sodium 136 - 145 mEq/L 140 142 140  Potassium 3.5 - 5.1 mEq/L 3.6 3.6 3.8  Chloride 101 - 111 mmol/L - - 99(L)  CO2 22 - 29 mEq/L _0 Calcium 8.4 - 10.4 mg/dL 9.4 9.7 9.5  Total Protein 6.4 - 8.3 g/dL 7.1 7.2 -  Total Bilirubin 0.20 - 1.20 mg/dL 0.52 0.59 -  Alkaline Phos 40 - 150 U/L 69 64 -  AST 5 - 34 U/L 26 24 -  ALT 0 - 55 U/L 19 19 -     PATHOLOGY REPORT:  Diagnosis 10/25/2015 Breast, left, needle core biopsy - ATYPICAL DUCTAL HYPERPLASIA. Microscopic Comment The findings are called to [facility]) on 10/26/2015. Dr. Donato Heinz has seen this case in consultation with agreement. (RH:kh 10/26/2015)   Diagnosis 12/01/2015 Breast, lumpectomy, left - INVASIVE DUCTAL CARCINOMA, SEE COMMENT. - INVASIVE TUMOR IS 8 MM FROM THE NEAREST MARGIN (SUPERIOR) -  DUCTAL CARCINOMA IN SITU WITH NECROSIS AND CALCIFICATIONS. - IN SITU CARCINOMA IS 4 MM FROM THE NEAREST MARGIN (SUPERIOR) - PREVIOUS BIOPSY SITE TISSUE CHANGE. - SEE TUMOR SYNOPTIC TEMPLATE BELOW. Microscopic Comment BREAST, INVASIVE TUMOR, WITHOUT LYMPH NODES PRESENT Specimen, including laterality : Left breast without sentinel lymph node sampling Procedure: Lumpectomy Histologic type: Ductal Grade: 2 of 3 Tubule formation: 3 Nuclear pleomorphism: 2 Mitotic: 1 Tumor size (gross measurement): 1.2 cm Margins: Invasive, distance to closest margin: 8 mm In-situ, distance to closest margin: 4 mm If margin positive, focally or broadly: N/A Lymphovascular invasion: Absent Ductal carcinoma in situ: Present Grade: 2 of 3 Extensive intraductal component: Absent Lobular neoplasia: Absent Tumor focality: Unifocal Treatment effect: None If present, treatment effect in breast tissue, lymph nodes or both: N/A Extent of tumor: Skin: N/A Nipple: N/A Skeletal muscle: N/A Breast prognostic profile: Estrogen receptor: Pending and will be reported in an addendum Progesterone receptor: Pending and will be reported in an addendum. Her 2 neu: Pending and will be reported in an addendum Ki-67: Pending and will be reported in an addendum. Non-neoplastic breast: Previous biopsy site tissue change, fibrocystic changes and microcalcifications. TNM: pT1c, pNX, pMX Comments: Multiple recut sections were examined. The absence of the myoepithelial layer was confirmed with smooth muscle myosin heavy chain, Calponin and p63 immunostains (blocks 1A and 1C). (CR:kh 12-05-15)  Results: IMMUNOHISTOCHEMICAL  AND MORPHOMETRIC ANALYSIS PERFORMED MANUALLY Estrogen Receptor: 90%, POSITIVE, STRONG STAINING INTENSITY Progesterone Receptor: 20%, POSITIVE, STRONG STAINING INTENSITY Proliferation Marker Ki67: 5%  Results: HER2 - NEGATIVE RATIO OF HER2/CEP17 SIGNALS 1.33 AVERAGE HER2 COPY NUMBER PER CELL  2.85  Diagnosis 12/27/2015 1. Fatty tissue BENIGN FIBROADIPOSE TISSUE 2. Lymph nodes, regional resection, Left axiliary node FIVE BENIGN LYMPH NODES (0/5)  ONCOTYPE Dx RS 7, which predicts 10- year distant recurrence risk of 6% with tamoxifen alone.  RADIOGRAPHIC STUDIES: I have personally reviewed the radiological images as listed and agreed with the findings in the report. No results found.  ASSESSMENT & PLAN:  75 year old Caucasian female, with past medical history of hypertension, arthritis, presented with screening discovered left breast cancer.  1. 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  -I reviewed the Oncotype test result, the recurrence score is 7, which is a low risk, she would not benefit from adjuvant chemotherapy.  -Giving the strong ER/PR positivity, I recommend her to have a aromatase inhibitor as adjuvant endocrine therapy to reduce risk of cancer recurrence. The benefit and risks were discussed with patient, she declined. -she has completed radiation  -Continue breast cancer surveillance, with annual screening mammogram, self exam, and routine office viists with labs.. -She is clinically doing well, exam was unremarkable, lab results reviewed, CBC and CMP are within normal limits. No evidence of recurrence. Continue surveillance. -I encouraged her to eat healthy, and exercise regularly.  2. HTN, arthritis,  -She will continue follow-up with her primary care physician, and exercise  3. Genetics -She has strong family history of breast and ovarian cancer. -She had genetic testing done at [facility] and was negative for BRCA1, BRCA2, CHECK2, ATM, BARD1, MLH1, MSH2, MSH6, PALB2, PMS2, PTEN, RAD51C, RAD51D, STK11 and TP53 -She is very interested in genetic counseling, and would like to know if she needs additional  genetic testing. She has a daughter, she is very concerned about her daughter's risk of breast cancer.  -She was seen by our genetic counselor Ms. Boggs who did not recommend further more genetic testing.   4. Abdominal pain -She has mild intermittent abdominal pain, and a few episodes of diarrhea -I encouraged her to follow-up with her primary care physician -She will try probiotics, and avoid dairy products.  5. Left breast pain -She has intermittent left breast shooting pain, likely related to breast surgery.  Plan -Lab and follow-up in 4 months, if she is is doing well, I'll see her every 6 months after the next visit -She is due for mammogram in December, I'll schedule for her   All questions were answered. The patient knows to call the clinic with any problems, questions or concerns. I spent 25 minutes counseling the patient face to face. The total time spent in the appointment was 30 minutes and more than 50% was on counseling.     Truitt Merle, MD       08/03/2016

## 2016-08-03 NOTE — Telephone Encounter (Signed)
GAVE PATIENT AVS REPORT AND APPOINTMENTS FOR January 2018. PATIENT TO SCHEDULE ANNUAL MAMMO.

## 2016-08-05 ENCOUNTER — Encounter: Payer: Self-pay | Admitting: Hematology

## 2016-08-10 ENCOUNTER — Encounter: Payer: Self-pay | Admitting: Hematology

## 2016-09-03 DIAGNOSIS — Z23 Encounter for immunization: Secondary | ICD-10-CM | POA: Diagnosis not present

## 2016-09-03 DIAGNOSIS — Z0000 Encounter for general adult medical examination without abnormal findings: Secondary | ICD-10-CM | POA: Diagnosis not present

## 2016-09-03 DIAGNOSIS — M79605 Pain in left leg: Secondary | ICD-10-CM | POA: Diagnosis not present

## 2016-09-03 DIAGNOSIS — M7122 Synovial cyst of popliteal space [Baker], left knee: Secondary | ICD-10-CM | POA: Diagnosis not present

## 2016-09-06 DIAGNOSIS — M25562 Pain in left knee: Secondary | ICD-10-CM | POA: Diagnosis not present

## 2016-09-25 DIAGNOSIS — M25562 Pain in left knee: Secondary | ICD-10-CM | POA: Diagnosis not present

## 2016-10-04 DIAGNOSIS — M25562 Pain in left knee: Secondary | ICD-10-CM | POA: Diagnosis not present

## 2016-10-04 DIAGNOSIS — S83232D Complex tear of medial meniscus, current injury, left knee, subsequent encounter: Secondary | ICD-10-CM | POA: Diagnosis not present

## 2016-10-25 DIAGNOSIS — N320 Bladder-neck obstruction: Secondary | ICD-10-CM | POA: Diagnosis not present

## 2016-10-25 DIAGNOSIS — N6002 Solitary cyst of left breast: Secondary | ICD-10-CM | POA: Diagnosis not present

## 2016-10-25 DIAGNOSIS — Z853 Personal history of malignant neoplasm of breast: Secondary | ICD-10-CM | POA: Diagnosis not present

## 2016-10-29 DIAGNOSIS — E039 Hypothyroidism, unspecified: Secondary | ICD-10-CM | POA: Diagnosis not present

## 2016-10-29 DIAGNOSIS — M810 Age-related osteoporosis without current pathological fracture: Secondary | ICD-10-CM | POA: Diagnosis not present

## 2016-10-29 DIAGNOSIS — I10 Essential (primary) hypertension: Secondary | ICD-10-CM | POA: Diagnosis not present

## 2016-11-01 DIAGNOSIS — E039 Hypothyroidism, unspecified: Secondary | ICD-10-CM | POA: Diagnosis not present

## 2016-11-01 DIAGNOSIS — M859 Disorder of bone density and structure, unspecified: Secondary | ICD-10-CM | POA: Diagnosis not present

## 2016-11-01 DIAGNOSIS — E7800 Pure hypercholesterolemia, unspecified: Secondary | ICD-10-CM | POA: Diagnosis not present

## 2016-11-02 DIAGNOSIS — S83232D Complex tear of medial meniscus, current injury, left knee, subsequent encounter: Secondary | ICD-10-CM | POA: Diagnosis not present

## 2016-11-02 DIAGNOSIS — M25562 Pain in left knee: Secondary | ICD-10-CM | POA: Diagnosis not present

## 2016-11-28 NOTE — Progress Notes (Signed)
[place]  [phone]) [phone] [phone]) [phone]  Clinic follow Up Note   Patient Care Team: [name], MD as PCP - General (Internal Medicine) Sueanne Margarita, MD as Consulting Physician (Cardiology) Jodi Marble, MD as Consulting Physician (Otolaryngology) [name], MD as Consulting Physician (General Surgery) Truitt Merle, MD as Consulting Physician (Hematology) Kyung Rudd, MD as Consulting Physician (Radiation Oncology) Sylvan Cheese, NP as Nurse Practitioner (Hematology and Oncology) Aloha Gell, MD as Consulting Physician (Obstetrics and Gynecology) 11/30/2016   CHIEF COMPLAINTS:  Follow up left breast cancer   Oncology History   Breast cancer of upper-outer quadrant of left female breast [facility])   Staging form: Breast, AJCC 7th Edition     Pathologic stage from 11/29/2015: Stage IA (T1c, N0, cM0) - Signed by Truitt Merle, MD on 01/14/2016     Clinical: Stage IA (T1c, N0, M0) - Unsigned        Breast cancer of upper-outer quadrant of left female breast ([place])   07/18/2014 Procedure    Hereditary cancer panel: no deleterious mutation at ATM, BARD1, BRCA1, BRCA2, BRIP1, CDH1, CHEK2, EPCAM, MLH1, MSH2, MSH6, NBN, PALB2, PMS2, PTEN, RAD51C, RAD51D, STK11, and TP53.      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      12/27/2015 Pathology Results    Second surgery with sentinel lymph node biopsy showed no malignant cells  in 5 lymph nodes      01/23/2016 - 02/17/2016 Radiation Therapy    Adjuvant left Breast radiation: Left breast treated to 42.5 Gy in 17 fractions, Left breast boost treated to 7.5 Gy in 3 fractions        Anti-estrogen oral therapy    She declined adjuvant AI or tamoxifen       05/03/2016 Survivorship    SCP visit completed       HISTORY OF PRESENTING ILLNESS:  Audrey [name] 76 y.o. female is here because of her recently diagnosed left breast cancer.  This was dicovered by screening mammogram on 10/13/2015, which showed a 1.3 cm irregular focal asymmetry in the 1:00 position of left breast. She underwent diagnostic mammogram and ultrasound and core needle biopsy, which showed atypical ductal hyperplasia. She was referred to breast surgeon Dr. Lucia Gaskins. She underwent left breast lumpectomy on 11/30/2014, no sentinel lymph nodes. 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  She has recovered very well, she took a few tablets of Tylenol after surgery, the pain has resolved. She is back to her normal exercise routine without any issues.  She has arthritis, especially knee, shoudler and hands, takes Aleve evarage once a day. She is fairly active, exercise daily at home, including yaga and treadmill. She has good appetite and eating well, no recent weight change. No other complaints.   GYN HISTORY  Menarchal: 13 LMP: 1991 Contraceptive: no  HRT: no  G2P2: (+) breast  feeding   CURRENT THERAPY:  Surveillance  INTERIM HISTORY Audrey [name] turns for follow-up. She has decided not to take adjuvant endocrine therapy due to the concerns of side effects. Doing well. She reports an upset stomach and fatigue. The patient attributes her upset stomach to being placed on a thyroid supplement in the past 4 weeks. She only takes 2 tables 13 mcg a week. The patient has Nexium with taking 1 tablet daily. The patient reports having her mammogram at [facility] last week and it was benign.  MEDICAL  HISTORY:  Past Medical History:  Diagnosis Date  . Allergy   . Arthritis   . Bladder infection    hx of last one approx 5 years ago   . Breast cancer ([place]) 10/25/15   left breast  ADH   . Cancer ([place])    breast  . Complication of anesthesia    difficulty awakening   . Diastolic dysfunction   . GERD (gastroesophageal reflux disease)   . 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 Hypertension   . Obesity   . PONV (postoperative nausea and vomiting)   . Reflux    buring and dysphagia sx resp to PPI; egd 3/06 neg for Barretts  . 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 Yeast infection    hx of     SURGICAL HISTORY: Past Surgical History:  Procedure Laterality Date  . AXILLARY LYMPH NODE BIOPSY Left 12/27/2015   Procedure: LEFT AXILLARY SENTINEL LYMPH NODE BIOPSY;  Surgeon: [name], MD;  Location: [place];  Service: General;  Laterality: Left;  . BELPHAROPTOSIS REPAIR Bilateral   . BREAST LUMPECTOMY WITH RADIOACTIVE SEED LOCALIZATION Left 12/01/2015   Procedure: LEFT BREAST [place] RADIOACTIVE SEED LOCALIZATION;  Surgeon: [name], MD;  Location: [place];  Service: General;  Laterality: Left;  . COLONOSCOPY  3/06   NEG  . cortisone injections     right arm; back  . EYE SURGERY     cosmetic; eyelid surgery bilat   . FINGER SURGERY     left index finger/ cyst 02/2007  . KNEE ARTHROSCOPY Right 04/13/2015   Procedure: ARTHROSCOPY RIGHT KNEE WITH MEDIAL MENISCAL DEBRIDEMENT AND CHONDROPLASTY;  Surgeon: Gaynelle Arabian, MD;  Location: [facility];  Service: Orthopedics;  Laterality: Right;  . left rotator cuff repair     12/2006  . LUMBAR LAMINECTOMY    . TONSILLECTOMY      SOCIAL HISTORY: Social History   Social History  . Marital Status: Married    Spouse Name: N/A  . Number of Children: 2, daughter 74, son 52  . Years of Education: 13   Occupational History  . retired    Social History  Main Topics  . Smoking status: Never Smoker   . Smokeless tobacco: Never Used  . Alcohol Use: No  . Drug Use: No  . Sexual Activity: Not on file   Other Topics Concern  . Not on file   Social History Narrative   Patient is right handed.   Patient drinks 2-3 cups of caffeine per day.    FAMILY HISTORY: Family History  Problem Relation Age of Onset  . Diabetes Mother   . Breast cancer Mother     dx. late 76s-early 55s; "metastasis to throat"?  . Hodgkin's lymphoma Father 14  . Breast cancer Sister     maternal half-sister dx. w/ DCIS in her late 48s;  . Other Sister  learning disabilities; lives on her own  . Alcohol abuse Brother   . Breast cancer Maternal Aunt     dx. 25s  . Ovarian cancer Maternal Aunt 59  . Breast cancer Paternal Aunt     dx. early 8s  . Depression Paternal Grandmother   . Heart Problems Paternal Grandmother   . Stroke Maternal Grandfather   . Heart Problems Paternal Grandmother   . Skin cancer Daughter     dx. early 37s  . Other Daughter     insterstitial cystitis  . Heart Problems Paternal Aunt     ALLERGIES:  is allergic to cymbalta [duloxetine hcl]; other; and macrodantin [nitrofurantoin].  MEDICATIONS:  Current Outpatient Prescriptions  Medication Sig Dispense Refill  . aspirin 81 MG tablet Take 81 mg by mouth daily.    . Biotin 10 MG TABS Take 1 tablet by mouth daily.    . Calcium Carb-Cholecalciferol (CALCIUM + D3) 600-200 MG-UNIT TABS Take 1 tablet by mouth daily.     . calcium carbonate (TUMS - DOSED IN MG ELEMENTAL CALCIUM) 500 MG chewable tablet Chew 1 tablet by mouth daily as needed for indigestion.     . cholecalciferol (VITAMIN D) 1000 UNITS tablet Take 2,000 Units by mouth daily.    Marland Kitchen Co-Enzyme Q10 200 MG CAPS Take 1 capsule by mouth daily.     . Cranberry 200 MG CAPS Take 1 capsule by mouth daily.     . Cyanocobalamin (VITAMIN B-12) 5000 MCG SUBL Place 1 tablet under the tongue daily.    . Esomeprazole Magnesium (NEXIUM  PO) Take 1 capsule by mouth daily.    Marland Kitchen gabapentin (NEURONTIN) 300 MG capsule One capsule twice during the day and 2 at night 360 capsule 3  . hydrochlorothiazide (MICROZIDE) 12.5 MG capsule Take 1 capsule (12.5 mg total) by mouth daily. 90 capsule 1  . Krill Oil 300 MG CAPS Take 1 capsule by mouth daily.    Marland Kitchen loratadine (CLARITIN) 10 MG tablet Take 10 mg by mouth daily as needed. Reported on 05/03/2016    . losartan (COZAAR) 50 MG tablet Take 50 mg by mouth every morning.     . Multiple Vitamins-Minerals (PRESERVISION AREDS 2) CAPS Take 1 tablet by mouth 2 (two) times daily.     . nebivolol (BYSTOLIC) 5 MG tablet Take 5 mg by mouth daily.    . non-metallic deodorant Thornton Papas) MISC Apply 1 application topically daily as needed.    . rosuvastatin (CRESTOR) 10 MG tablet Take 10 mg by mouth 2 (two) times a week.     No current facility-administered medications for this visit.     REVIEW OF SYSTEMS:   Constitutional: Denies fevers, chills or abnormal night sweats Eyes: Denies blurriness of vision, double vision or watery eyes Ears, nose, mouth, throat, and face: Denies mucositis or sore throat Respiratory: Denies cough, dyspnea or wheezes Cardiovascular: Denies palpitation, chest discomfort or lower extremity swelling Gastrointestinal:  Denies nausea, heartburn or change in bowel habits Skin: Denies abnormal skin rashes Lymphatics: Denies new lymphadenopathy or easy bruising Neurological:Denies numbness, tingling or new weaknesses Behavioral/Psych: Mood is stable, no new changes  All other systems were reviewed with the patient and are negative.  PHYSICAL EXAMINATION: ECOG PERFORMANCE STATUS: 0 - Asymptomatic  Vitals:   11/30/16 1423  BP: (!) 157/71  Pulse: 84  Resp: 18  Temp: 98.4 F (36.9 C)   Filed Weights   11/30/16 1423  Weight: 170 lb (77.1 kg)    GENERAL:alert, no distress and  comfortable SKIN: skin color, texture, turgor are normal, no rashes or significant lesions EYES:  normal, conjunctiva are pink and non-injected, sclera clear OROPHARYNX:no exudate, no erythema and lips, buccal mucosa, and tongue normal  NECK: supple, thyroid normal size, non-tender, without nodularity LYMPH:  no palpable lymphadenopathy in the cervical, axillary or inguinal LUNGS: clear to auscultation and percussion with normal breathing effort HEART: regular rate & rhythm and no murmurs and no lower extremity edema ABDOMEN:abdomen soft, non-tender and normal bowel sounds Musculoskeletal:no cyanosis of digits and no clubbing  PSYCH: alert & oriented x 3 with fluent speech NEURO: no focal motor/sensory deficits BREAST: Two incisions in the left breast breast UOQ and axillary incision well healed. Scar tissue underneath. Otherwise negative exam.  LABORATORY DATA:  I have reviewed the data as listed CBC Latest Ref Rng & Units 11/30/2016 08/03/2016 02/17/2016  WBC 3.9 - 10.3 10e3/uL 7.2 8.2 6.8  Hemoglobin 11.6 - 15.9 g/dL 13.9 14.8 14.5  Hematocrit 34.8 - 46.6 % 39.0 42.2 42.6  Platelets 145 - 400 10e3/uL 332 345 328    CMP Latest Ref Rng & Units 11/30/2016 08/03/2016 02/17/2016  Glucose 70 - 140 mg/dl 103 112 96  BUN 7.0 - 26.0 mg/dL 14.2 10.9 12.0  Creatinine 0.6 - 1.1 mg/dL 0.8 0.9 0.8  Sodium 136 - 145 mEq/L 141 140 142  Potassium 3.5 - 5.1 mEq/L 3.5 3.6 3.6  Chloride 101 - 111 mmol/L - - -  CO2 22 - 29 mEq/L '29 28 29  '$ Calcium 8.4 - 10.4 mg/dL 9.5 9.4 9.7  Total Protein 6.4 - 8.3 g/dL 7.1 7.1 7.2  Total Bilirubin 0.20 - 1.20 mg/dL 0.55 0.52 0.59  Alkaline Phos 40 - 150 U/L 71 69 64  AST 5 - 34 U/L '25 26 24  '$ ALT 0 - 55 U/L '20 19 19     '$ PATHOLOGY REPORT:  Diagnosis 10/25/2015 Breast, left, needle core biopsy - ATYPICAL DUCTAL HYPERPLASIA. Microscopic Comment The findings are called to [facility] on 10/26/2015. Dr. Donato Heinz has seen this case in consultation with agreement. (RH:kh 10/26/2015)   Diagnosis 12/01/2015 Breast, lumpectomy,  left - INVASIVE DUCTAL CARCINOMA, SEE COMMENT. - INVASIVE TUMOR IS 8 MM FROM THE NEAREST MARGIN (SUPERIOR) - DUCTAL CARCINOMA IN SITU WITH NECROSIS AND CALCIFICATIONS. - IN SITU CARCINOMA IS 4 MM FROM THE NEAREST MARGIN (SUPERIOR) - PREVIOUS BIOPSY SITE TISSUE CHANGE. - SEE TUMOR SYNOPTIC TEMPLATE BELOW. Microscopic Comment BREAST, INVASIVE TUMOR, WITHOUT LYMPH NODES PRESENT Specimen, including laterality : Left breast without sentinel lymph node sampling Procedure: Lumpectomy Histologic type: Ductal Grade: 2 of 3 Tubule formation: 3 Nuclear pleomorphism: 2 Mitotic: 1 Tumor size (gross measurement): 1.2 cm Margins: Invasive, distance to closest margin: 8 mm In-situ, distance to closest margin: 4 mm If margin positive, focally or broadly: N/A Lymphovascular invasion: Absent Ductal carcinoma in situ: Present Grade: 2 of 3 Extensive intraductal component: Absent Lobular neoplasia: Absent Tumor focality: Unifocal Treatment effect: None If present, treatment effect in breast tissue, lymph nodes or both: N/A Extent of tumor: Skin: N/A Nipple: N/A Skeletal muscle: N/A Breast prognostic profile: Estrogen receptor: Pending and will be reported in an addendum Progesterone receptor: Pending and will be reported in an addendum. Her 2 neu: Pending and will be reported in an addendum Ki-67: Pending and will be reported in an addendum. Non-neoplastic breast: Previous biopsy site tissue change, fibrocystic changes and microcalcifications. TNM: pT1c, pNX, pMX Comments: Multiple recut sections were examined. The absence of the myoepithelial layer  was confirmed with smooth muscle myosin heavy chain, Calponin and p63 immunostains (blocks 1A and 1C). (CR:kh 12-05-15)  Results: IMMUNOHISTOCHEMICAL AND MORPHOMETRIC ANALYSIS PERFORMED MANUALLY Estrogen Receptor: 90%, POSITIVE, STRONG STAINING INTENSITY Progesterone Receptor: 20%, POSITIVE, STRONG STAINING INTENSITY Proliferation Marker Ki67:  5%  Results: HER2 - NEGATIVE RATIO OF HER2/CEP17 SIGNALS 1.33 AVERAGE HER2 COPY NUMBER PER CELL 2.85  Diagnosis 12/27/2015 1. Fatty tissue BENIGN FIBROADIPOSE TISSUE 2. Lymph nodes, regional resection, Left axiliary node FIVE BENIGN LYMPH NODES (0/5)  ONCOTYPE Dx RS 7, which predicts 10- year distant recurrence risk of 6% with tamoxifen alone.  RADIOGRAPHIC STUDIES: I have personally reviewed the radiological images as listed and agreed with the findings in the report. No results found.  ASSESSMENT & PLAN:  76 y.o. Caucasian female, with past medical history of hypertension, arthritis, presented with screening discovered left breast cancer.  1. 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  -I reviewed the Oncotype test result, the recurrence score is 7, which is a low risk, she would not benefit from adjuvant chemotherapy.  -Giving the strong ER/PR positivity, I recommend her to have a aromatase inhibitor as adjuvant endocrine therapy to reduce risk of cancer recurrence. The benefit and risks were discussed with patient, she declined. -she has completed radiation  -Continue breast cancer surveillance, with annual screening mammogram, self exam, and routine office viists with labs. -She is clinically doing well, exam was unremarkable, lab results reviewed, CBC and CMP are within normal limits. Last mammo in 10/2016 was normal. No evidence of recurrence. Continue surveillance. -I encouraged her to eat healthy, and exercise regularly.  2. HTN, arthritis,  -She will continue follow-up with her primary care physician, and exercise  3. Genetics -She has strong family history of breast and ovarian cancer. -She had genetic testing done at [facility] and was negative for BRCA1, BRCA2, CHECK2, ATM, BARD1, MLH1, MSH2, MSH6, PALB2, PMS2, PTEN,  RAD51C, RAD51D, STK11 and TP53 -She is very interested in genetic counseling, and would like to know if she needs additional genetic testing. She has a daughter, she is very concerned about her daughter's risk of breast cancer.  -She was seen by our genetic counselor Audrey. Boggs who did not recommend further more genetic testing.   4. Epigastricl pain -She has mild intermittent epigastric pain  -She will try probiotics, and avoid dairy products. -She has an upset stomach and is taking Nexium '20mg'$  1 tablet daily by her PCP. I advised her that she could increase up to 2 tablets daily. -If it continues then she may need to see her GI.   5. Hypothyroidism -Managed by her PCP  Plan -Lab and follow-up in 1 year. -I will get a copy of her recent mammogram from [facility] yearly mammograms, next due in 10/2017 -Advised her to increase Nexium 20 mg to 2 tablets a day. If she does not improve, then she should see her GI. -Encouraged her to continue diet and exercise.   All questions were answered. The patient knows to call the clinic with any problems, questions or concerns. I spent 15 minutes counseling the patient face to face. The total time spent in the appointment was 0 minutes and more than 50% was on counseling.     Truitt Merle, MD       11/30/2016   This document serves as a record of services personally performed by  Truitt Merle, MD. It was created on her behalf by Darcus Austin, a trained medical scribe. The creation of this record is based on the scribe's personal observations and the provider's statements to them. This document has been checked and approved by the attending provider.

## 2016-11-30 ENCOUNTER — Encounter: Payer: Self-pay | Admitting: Hematology

## 2016-11-30 ENCOUNTER — Other Ambulatory Visit (HOSPITAL_BASED_OUTPATIENT_CLINIC_OR_DEPARTMENT_OTHER): Payer: PPO

## 2016-11-30 ENCOUNTER — Ambulatory Visit (HOSPITAL_BASED_OUTPATIENT_CLINIC_OR_DEPARTMENT_OTHER): Payer: PPO | Admitting: Hematology

## 2016-11-30 VITALS — BP 157/71 | HR 84 | Temp 98.4°F | Resp 18 | Ht 59.0 in | Wt 170.0 lb

## 2016-11-30 DIAGNOSIS — I10 Essential (primary) hypertension: Secondary | ICD-10-CM | POA: Diagnosis not present

## 2016-11-30 DIAGNOSIS — C50412 Malignant neoplasm of upper-outer quadrant of left female breast: Secondary | ICD-10-CM

## 2016-11-30 DIAGNOSIS — R1013 Epigastric pain: Secondary | ICD-10-CM

## 2016-11-30 DIAGNOSIS — E039 Hypothyroidism, unspecified: Secondary | ICD-10-CM

## 2016-11-30 DIAGNOSIS — Z170 Estrogen receptor positive status [ER+]: Secondary | ICD-10-CM | POA: Diagnosis not present

## 2016-11-30 LAB — CBC WITH DIFFERENTIAL/PLATELET
BASO%: 0.3 % (ref 0.0–2.0)
Basophils Absolute: 0 10*3/uL (ref 0.0–0.1)
EOS ABS: 0.1 10*3/uL (ref 0.0–0.5)
EOS%: 1.4 % (ref 0.0–7.0)
HEMATOCRIT: 39 % (ref 34.8–46.6)
HEMOGLOBIN: 13.9 g/dL (ref 11.6–15.9)
LYMPH#: 1.6 10*3/uL (ref 0.9–3.3)
LYMPH%: 21.5 % (ref 14.0–49.7)
MCH: 30.7 pg (ref 25.1–34.0)
MCHC: 35.6 g/dL (ref 31.5–36.0)
MCV: 86.1 fL (ref 79.5–101.0)
MONO#: 0.5 10*3/uL (ref 0.1–0.9)
MONO%: 6.9 % (ref 0.0–14.0)
NEUT#: 5.1 10*3/uL (ref 1.5–6.5)
NEUT%: 69.9 % (ref 38.4–76.8)
PLATELETS: 332 10*3/uL (ref 145–400)
RBC: 4.53 10*6/uL (ref 3.70–5.45)
RDW: 13.5 % (ref 11.2–14.5)
WBC: 7.2 10*3/uL (ref 3.9–10.3)

## 2016-11-30 LAB — COMPREHENSIVE METABOLIC PANEL
ALBUMIN: 4 g/dL (ref 3.5–5.0)
ALK PHOS: 71 U/L (ref 40–150)
ALT: 20 U/L (ref 0–55)
AST: 25 U/L (ref 5–34)
Anion Gap: 10 mEq/L (ref 3–11)
BILIRUBIN TOTAL: 0.55 mg/dL (ref 0.20–1.20)
BUN: 14.2 mg/dL (ref 7.0–26.0)
CO2: 29 mEq/L (ref 22–29)
Calcium: 9.5 mg/dL (ref 8.4–10.4)
Chloride: 102 mEq/L (ref 98–109)
Creatinine: 0.8 mg/dL (ref 0.6–1.1)
EGFR: 70 mL/min/{1.73_m2} — AB (ref >90)
GLUCOSE: 103 mg/dL (ref 70–140)
Potassium: 3.5 mEq/L (ref 3.5–5.1)
SODIUM: 141 meq/L (ref 136–145)
TOTAL PROTEIN: 7.1 g/dL (ref 6.4–8.3)

## 2016-12-05 ENCOUNTER — Other Ambulatory Visit: Payer: Self-pay | Admitting: *Deleted

## 2016-12-06 ENCOUNTER — Encounter: Payer: Self-pay | Admitting: Adult Health

## 2016-12-06 ENCOUNTER — Ambulatory Visit (INDEPENDENT_AMBULATORY_CARE_PROVIDER_SITE_OTHER): Payer: PPO | Admitting: Adult Health

## 2016-12-06 VITALS — BP 141/87 | HR 72 | Wt 171.2 lb

## 2016-12-06 DIAGNOSIS — G609 Hereditary and idiopathic neuropathy, unspecified: Secondary | ICD-10-CM

## 2016-12-06 NOTE — Progress Notes (Signed)
I have read the note, and I agree with the clinical assessment and plan.  Audrey [name]   

## 2016-12-06 NOTE — Progress Notes (Signed)
PATIENT: Audrey [name] DOB: 10-04-1941  REASON FOR VISIT: follow up HISTORY FROM: patient  HISTORY OF PRESENT ILLNESS: Today 12/06/2016:  Mr. Audrey [name] is a 76 year old female with a history of peripheral neuropathy. She returns today for follow-up. She is currently taking gabapentin 300 mg in the morning and at noon and 600 mg in the evening. She reports that this continues to be beneficial for her discomfort. She denies any significant changes with her gait or balance. Denies any falls. She continues to participate in yoga and finds it beneficial for her balance. She denies any new neurological symptoms. She returns today for an evaluation.  HISTORY 06/05/16: Ms. Audrey [name] is a 76 year old female with a history of peripheral neuropathy. She returns today for follow-up. At the last visit gabapentin was increased to 300 mg in the morning, noon and 600 mg in the evening. She reports that this has been very beneficial. She is sleeping well with this increase. Denies any significant discomfort in the lower extremities. She reports every now and then she will have a "creepy crawly sensations on the legs." She reports this has only happened 3-4 times. She denies any changes with her gait or balance. She states that she is participating in yoga and has noticed positive changes with her balance. She denies any new neurological symptoms. Returns today for an evaluation.  HISTORY 12/06/15 ([place]): Ms. Audrey [name] is a 76 year old right-handed white female with a history of a peripheral neuropathy documented by EMG and nerve conduction study. The patient recently has been diagnosed with early breast cancer. The patient is been on gabapentin taking 300 mg 3 times daily or her neuropathy, initially with excellent benefit. The patient comes back in with recent onset of discomfort that she claims is mainly within the knee joints bilaterally and in the joints of the hands bilaterally. When she gets up out of a chair, she  will have significant pain in the knees, and when she starts to walk, the pain will lessen. The patient also indicates that she may have some discomfort in the joints of the hands with use of the hands. The patient does have some neck stiffness as well. The patient continues to be actively involved with yoga , she has difficulty performing some of the tasks with yoga that involved balance. The patient denies any falls. In the past, she has not then able to tolerate arthritis medications well. She returns to this office for an evaluation.   REVIEW OF SYSTEMS: Out of a complete 14 system review of symptoms, the patient complains only of the following symptoms, and all other reviewed systems are negative.  ALLERGIES: Allergies  Allergen Reactions  . Cymbalta [Duloxetine Hcl]     Diarrhea  . Other     Dermabond & Cat gut  . Macrodantin [Nitrofurantoin] Rash    HOME MEDICATIONS: Outpatient Medications Prior to Visit  Medication Sig Dispense Refill  . Biotin 10 MG TABS Take 1 tablet by mouth daily.    . Calcium Carb-Cholecalciferol (CALCIUM + D3) 600-200 MG-UNIT TABS Take 1 tablet by mouth daily.     . calcium carbonate (TUMS - DOSED IN MG ELEMENTAL CALCIUM) 500 MG chewable tablet Chew 1 tablet by mouth daily as needed for indigestion.     . cholecalciferol (VITAMIN D) 1000 UNITS tablet Take 2,000 Units by mouth daily.    Marland Kitchen Co-Enzyme Q10 200 MG CAPS Take 1 capsule by mouth daily.     . Cranberry 200 MG CAPS Take 1 capsule  by mouth daily.     . Cyanocobalamin (VITAMIN B-12) 5000 MCG SUBL Place 1 tablet under the tongue daily.    . Esomeprazole Magnesium (NEXIUM PO) Take 1 capsule by mouth daily.    Marland Kitchen gabapentin (NEURONTIN) 300 MG capsule One capsule twice during the day and 2 at night 360 capsule 3  . hydrochlorothiazide (MICROZIDE) 12.5 MG capsule Take 1 capsule (12.5 mg total) by mouth daily. 90 capsule 1  . Krill Oil 300 MG CAPS Take 1 capsule by mouth daily.    Marland Kitchen loratadine (CLARITIN) 10  MG tablet Take 10 mg by mouth daily as needed. Reported on 05/03/2016    . meloxicam (MOBIC) 15 MG tablet Take 15 mg by mouth every morning.    . Multiple Vitamins-Minerals (PRESERVISION AREDS 2) CAPS Take 1 tablet by mouth 2 (two) times daily.     . nebivolol (BYSTOLIC) 5 MG tablet Take 5 mg by mouth daily.    . non-metallic deodorant Jethro Poling) MISC Apply 1 application topically daily as needed.    . rosuvastatin (CRESTOR) 10 MG tablet Take 10 mg by mouth 2 (two) times a week.    Marland Kitchen TIROSINT 13 MCG CAPS      No facility-administered medications prior to visit.     PAST MEDICAL HISTORY: Past Medical History:  Diagnosis Date  . Allergy   . Arthritis   . Bladder infection    hx of last one approx 5 years ago   . 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 Cancer ([place])    breast  . Complication of anesthesia    difficulty awakening   . Diastolic dysfunction   . GERD (gastroesophageal reflux disease)   . 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 Hypertension   . Obesity   . PONV (postoperative nausea and vomiting)   . Reflux    buring and dysphagia sx resp to PPI; egd 3/06 neg for Barretts  . 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 Yeast infection    hx of     PAST SURGICAL HISTORY: Past Surgical History:  Procedure Laterality Date  . AXILLARY LYMPH NODE BIOPSY Left 12/27/2015   Procedure: LEFT AXILLARY SENTINEL LYMPH NODE BIOPSY;  Surgeon: [name], MD;  Location: [place];  Service: General;  Laterality: Left;  . BELPHAROPTOSIS REPAIR Bilateral   . BREAST LUMPECTOMY WITH RADIOACTIVE SEED LOCALIZATION Left 12/01/2015   Procedure: LEFT BREAST [place] RADIOACTIVE SEED LOCALIZATION;  Surgeon: [name], MD;  Location: [place];  Service: General;  Laterality: Left;  . COLONOSCOPY  3/06   NEG  . cortisone injections     right arm; back  . EYE SURGERY     cosmetic; eyelid surgery bilat     . FINGER SURGERY     left index finger/ cyst 02/2007  . KNEE ARTHROSCOPY Right 04/13/2015   Procedure: ARTHROSCOPY RIGHT KNEE WITH MEDIAL MENISCAL DEBRIDEMENT AND CHONDROPLASTY;  Surgeon: Gaynelle Arabian, MD;  Location: [facility];  Service: Orthopedics;  Laterality: Right;  . left rotator cuff repair     12/2006  . LUMBAR LAMINECTOMY    . TONSILLECTOMY      FAMILY HISTORY: Family History  Problem Relation Age of Onset  . Diabetes Mother   . Breast cancer Mother     dx. late 14s-early 22s; "metastasis to throat"?  . Hodgkin's lymphoma Father 59  . Breast cancer Sister  maternal half-sister dx. w/ DCIS in her late 88s;  . Other Sister     learning disabilities; lives on her own  . Breast cancer Maternal Aunt     dx. 78s  . Ovarian cancer Maternal Aunt 11  . Breast cancer Paternal Aunt     dx. early 40s  . Stroke Maternal Grandfather   . Depression Paternal Grandmother   . Heart Problems Paternal Grandmother   . Alcohol abuse Brother   . Skin cancer Daughter     dx. early 38s  . Other Daughter     insterstitial cystitis  . Heart Problems Paternal Aunt     SOCIAL HISTORY: Social History   Social History  . Marital status: Married    Spouse name: N/A  . Number of children: 2  . Years of education: 13   Occupational History  . retired    Social History Main Topics  . Smoking status: Never Smoker  . Smokeless tobacco: Never Used  . Alcohol use No  . Drug use: No  . Sexual activity: Not on file   Other Topics Concern  . Not on file   Social History Narrative   Patient is right handed.   Patient drinks 2-3 cups of caffeine per day.      PHYSICAL EXAM  Vitals:   12/06/16 1520  Weight: 171 lb 3.2 oz (77.7 kg)   Body mass index is 34.58 kg/m.  Generalized: Well developed, in no acute distress   Neurological examination  Mentation: Alert oriented to time, place, history taking. Follows all commands speech and language fluent Cranial nerve II-XII: Pupils  were equal round reactive to light. Extraocular movements were full, visual field were full on confrontational test. Facial sensation and strength were normal. Uvula tongue midline. Head turning and shoulder shrug  were normal and symmetric. Motor: The motor testing reveals 5 over 5 strength of all 4 extremities. Good symmetric motor tone is noted throughout.  Sensory: Sensory testing is intact to soft touch on all 4 extremities. No evidence of extinction is noted.  Coordination: Cerebellar testing reveals good finger-nose-finger and heel-to-shin bilaterally.  Gait and station: Gait is normal. Tandem gait is normal. Romberg is negative. No drift is seen.  Reflexes: Deep tendon reflexes are symmetric and normal bilaterally.   DIAGNOSTIC DATA (LABS, IMAGING, TESTING) - I reviewed patient records, labs, notes, testing and imaging myself where available.  Lab Results  Component Value Date   WBC 7.2 11/30/2016   HGB 13.9 11/30/2016   HCT 39.0 11/30/2016   MCV 86.1 11/30/2016   PLT 332 11/30/2016      Component Value Date/Time   NA 141 11/30/2016 1358   K 3.5 11/30/2016 1358   CL 99 (L) 12/23/2015 1500   CO2 29 11/30/2016 1358   GLUCOSE 103 11/30/2016 1358   BUN 14.2 11/30/2016 1358   CREATININE 0.8 11/30/2016 1358   CALCIUM 9.5 11/30/2016 1358   PROT 7.1 11/30/2016 1358   ALBUMIN 4.0 11/30/2016 1358   AST 25 11/30/2016 1358   ALT 20 11/30/2016 1358   ALKPHOS 71 11/30/2016 1358   BILITOT 0.55 11/30/2016 1358   GFRNONAA >60 12/23/2015 1500   GFRAA >60 12/23/2015 1500   No results found for: CHOL, HDL, LDLCALC, LDLDIRECT, TRIG, CHOLHDL No results found for: HGBA1C Lab Results  Component Value Date   VITAMINB12 >2000 (H) 02/02/2015   No results found for: TSH    ASSESSMENT AND PLAN 76 y.o. year old female  has a past  medical history of Allergy; Arthritis; Bladder infection; Breast cancer ([place]) (10/25/15); Cancer ([place]); Complication of anesthesia; Diastolic dysfunction; GERD  (gastroesophageal reflux disease); Hereditary and idiopathic peripheral neuropathy (02/10/2015); Hypertension; Obesity; PONV (postoperative nausea and vomiting); Reflux; Tachycardia ( ); and Yeast infection. here with:  1. Peripheral neuropathy  Overall the patient is doing well. She will continue on gabapentin 300 mg in the morning and at noon and 600 mg in the evening. Advised patient that if her symptoms worsen or she develops any new symptoms she should let us know. She will follow-up in one year with Dr. Jannifer Franklin.     Ward Givens, MSN, NP-C 12/06/2016, 3:25 PM [facility] [address], [place], [place] [address] [phone]

## 2016-12-06 NOTE — Patient Instructions (Signed)
Continue gabapentin If your symptoms worsen or you develop new symptoms please let us know.

## 2016-12-14 DIAGNOSIS — S83232D Complex tear of medial meniscus, current injury, left knee, subsequent encounter: Secondary | ICD-10-CM | POA: Diagnosis not present

## 2017-01-18 ENCOUNTER — Other Ambulatory Visit: Payer: Self-pay | Admitting: Neurology

## 2017-01-30 DIAGNOSIS — C50912 Malignant neoplasm of unspecified site of left female breast: Secondary | ICD-10-CM | POA: Diagnosis not present

## 2017-01-30 DIAGNOSIS — Z006 Encounter for examination for normal comparison and control in clinical research program: Secondary | ICD-10-CM | POA: Diagnosis not present

## 2017-02-01 DIAGNOSIS — E7800 Pure hypercholesterolemia, unspecified: Secondary | ICD-10-CM | POA: Diagnosis not present

## 2017-02-01 DIAGNOSIS — E039 Hypothyroidism, unspecified: Secondary | ICD-10-CM | POA: Diagnosis not present

## 2017-02-06 DIAGNOSIS — I10 Essential (primary) hypertension: Secondary | ICD-10-CM | POA: Diagnosis not present

## 2017-02-06 DIAGNOSIS — E7800 Pure hypercholesterolemia, unspecified: Secondary | ICD-10-CM | POA: Diagnosis not present

## 2017-02-06 DIAGNOSIS — E039 Hypothyroidism, unspecified: Secondary | ICD-10-CM | POA: Diagnosis not present

## 2017-03-06 DIAGNOSIS — M25559 Pain in unspecified hip: Secondary | ICD-10-CM | POA: Diagnosis not present

## 2017-03-06 DIAGNOSIS — M545 Low back pain: Secondary | ICD-10-CM | POA: Diagnosis not present

## 2017-03-20 DIAGNOSIS — M545 Low back pain: Secondary | ICD-10-CM | POA: Diagnosis not present

## 2017-03-20 DIAGNOSIS — M25559 Pain in unspecified hip: Secondary | ICD-10-CM | POA: Diagnosis not present

## 2017-06-26 DIAGNOSIS — M545 Low back pain: Secondary | ICD-10-CM | POA: Diagnosis not present

## 2017-06-26 DIAGNOSIS — M25559 Pain in unspecified hip: Secondary | ICD-10-CM | POA: Diagnosis not present

## 2017-07-31 DIAGNOSIS — D2272 Melanocytic nevi of left lower limb, including hip: Secondary | ICD-10-CM | POA: Diagnosis not present

## 2017-07-31 DIAGNOSIS — L821 Other seborrheic keratosis: Secondary | ICD-10-CM | POA: Diagnosis not present

## 2017-07-31 DIAGNOSIS — D225 Melanocytic nevi of trunk: Secondary | ICD-10-CM | POA: Diagnosis not present

## 2017-07-31 DIAGNOSIS — Z23 Encounter for immunization: Secondary | ICD-10-CM | POA: Diagnosis not present

## 2017-08-07 DIAGNOSIS — H2513 Age-related nuclear cataract, bilateral: Secondary | ICD-10-CM | POA: Diagnosis not present

## 2017-08-07 DIAGNOSIS — D3132 Benign neoplasm of left choroid: Secondary | ICD-10-CM | POA: Diagnosis not present

## 2017-08-07 DIAGNOSIS — H5203 Hypermetropia, bilateral: Secondary | ICD-10-CM | POA: Diagnosis not present

## 2017-08-12 DIAGNOSIS — I10 Essential (primary) hypertension: Secondary | ICD-10-CM | POA: Diagnosis not present

## 2017-08-12 DIAGNOSIS — E039 Hypothyroidism, unspecified: Secondary | ICD-10-CM | POA: Diagnosis not present

## 2017-08-15 DIAGNOSIS — M791 Myalgia: Secondary | ICD-10-CM | POA: Diagnosis not present

## 2017-08-15 DIAGNOSIS — I10 Essential (primary) hypertension: Secondary | ICD-10-CM | POA: Diagnosis not present

## 2017-08-15 DIAGNOSIS — E876 Hypokalemia: Secondary | ICD-10-CM | POA: Diagnosis not present

## 2017-08-15 DIAGNOSIS — E039 Hypothyroidism, unspecified: Secondary | ICD-10-CM | POA: Diagnosis not present

## 2017-08-15 DIAGNOSIS — Z23 Encounter for immunization: Secondary | ICD-10-CM | POA: Diagnosis not present

## 2017-08-15 DIAGNOSIS — E7800 Pure hypercholesterolemia, unspecified: Secondary | ICD-10-CM | POA: Diagnosis not present

## 2017-08-16 DIAGNOSIS — C50912 Malignant neoplasm of unspecified site of left female breast: Secondary | ICD-10-CM | POA: Diagnosis not present

## 2017-09-11 DIAGNOSIS — E876 Hypokalemia: Secondary | ICD-10-CM | POA: Diagnosis not present

## 2017-09-11 DIAGNOSIS — E039 Hypothyroidism, unspecified: Secondary | ICD-10-CM | POA: Diagnosis not present

## 2017-09-16 DIAGNOSIS — I10 Essential (primary) hypertension: Secondary | ICD-10-CM | POA: Diagnosis not present

## 2017-09-16 DIAGNOSIS — E039 Hypothyroidism, unspecified: Secondary | ICD-10-CM | POA: Diagnosis not present

## 2017-09-16 DIAGNOSIS — E876 Hypokalemia: Secondary | ICD-10-CM | POA: Diagnosis not present

## 2017-09-24 DIAGNOSIS — E039 Hypothyroidism, unspecified: Secondary | ICD-10-CM | POA: Diagnosis not present

## 2017-09-24 DIAGNOSIS — M2550 Pain in unspecified joint: Secondary | ICD-10-CM | POA: Diagnosis not present

## 2017-09-24 DIAGNOSIS — M79642 Pain in left hand: Secondary | ICD-10-CM | POA: Diagnosis not present

## 2017-09-24 DIAGNOSIS — Z6834 Body mass index (BMI) 34.0-34.9, adult: Secondary | ICD-10-CM | POA: Diagnosis not present

## 2017-09-24 DIAGNOSIS — M79641 Pain in right hand: Secondary | ICD-10-CM | POA: Diagnosis not present

## 2017-09-24 DIAGNOSIS — M791 Myalgia, unspecified site: Secondary | ICD-10-CM | POA: Diagnosis not present

## 2017-09-24 DIAGNOSIS — E785 Hyperlipidemia, unspecified: Secondary | ICD-10-CM | POA: Diagnosis not present

## 2017-09-24 DIAGNOSIS — E669 Obesity, unspecified: Secondary | ICD-10-CM | POA: Diagnosis not present

## 2017-10-02 DIAGNOSIS — M79602 Pain in left arm: Secondary | ICD-10-CM | POA: Diagnosis not present

## 2017-10-02 DIAGNOSIS — M79604 Pain in right leg: Secondary | ICD-10-CM | POA: Diagnosis not present

## 2017-10-02 DIAGNOSIS — M79605 Pain in left leg: Secondary | ICD-10-CM | POA: Diagnosis not present

## 2017-10-02 DIAGNOSIS — M546 Pain in thoracic spine: Secondary | ICD-10-CM | POA: Diagnosis not present

## 2017-10-02 DIAGNOSIS — M545 Low back pain: Secondary | ICD-10-CM | POA: Diagnosis not present

## 2017-10-02 DIAGNOSIS — M542 Cervicalgia: Secondary | ICD-10-CM | POA: Diagnosis not present

## 2017-10-02 DIAGNOSIS — M79601 Pain in right arm: Secondary | ICD-10-CM | POA: Diagnosis not present

## 2017-10-10 DIAGNOSIS — M545 Low back pain: Secondary | ICD-10-CM | POA: Diagnosis not present

## 2017-10-10 DIAGNOSIS — M79605 Pain in left leg: Secondary | ICD-10-CM | POA: Diagnosis not present

## 2017-10-10 DIAGNOSIS — M542 Cervicalgia: Secondary | ICD-10-CM | POA: Diagnosis not present

## 2017-10-10 DIAGNOSIS — M79604 Pain in right leg: Secondary | ICD-10-CM | POA: Diagnosis not present

## 2017-10-10 DIAGNOSIS — M546 Pain in thoracic spine: Secondary | ICD-10-CM | POA: Diagnosis not present

## 2017-10-10 DIAGNOSIS — M79602 Pain in left arm: Secondary | ICD-10-CM | POA: Diagnosis not present

## 2017-10-10 DIAGNOSIS — M79601 Pain in right arm: Secondary | ICD-10-CM | POA: Diagnosis not present

## 2017-10-31 DIAGNOSIS — R928 Other abnormal and inconclusive findings on diagnostic imaging of breast: Secondary | ICD-10-CM | POA: Diagnosis not present

## 2017-10-31 DIAGNOSIS — Z853 Personal history of malignant neoplasm of breast: Secondary | ICD-10-CM | POA: Diagnosis not present

## 2017-12-05 DIAGNOSIS — I10 Essential (primary) hypertension: Secondary | ICD-10-CM | POA: Diagnosis not present

## 2017-12-05 DIAGNOSIS — E876 Hypokalemia: Secondary | ICD-10-CM | POA: Diagnosis not present

## 2017-12-05 DIAGNOSIS — E039 Hypothyroidism, unspecified: Secondary | ICD-10-CM | POA: Diagnosis not present

## 2017-12-10 ENCOUNTER — Ambulatory Visit: Payer: PPO | Admitting: Neurology

## 2017-12-10 ENCOUNTER — Encounter: Payer: Self-pay | Admitting: Neurology

## 2017-12-10 VITALS — BP 139/86 | HR 76 | Ht 59.0 in | Wt 166.5 lb

## 2017-12-10 DIAGNOSIS — M797 Fibromyalgia: Secondary | ICD-10-CM | POA: Diagnosis not present

## 2017-12-10 DIAGNOSIS — G609 Hereditary and idiopathic neuropathy, unspecified: Secondary | ICD-10-CM

## 2017-12-10 MED ORDER — MILNACIPRAN HCL 12.5 MG PO TABS: 12.5000 mg | ORAL_TABLET | Freq: Every day | ORAL | 2 refills | Status: DC

## 2017-12-10 MED ORDER — GABAPENTIN 300 MG PO CAPS: ORAL_CAPSULE | ORAL | 3 refills | Status: DC

## 2017-12-10 NOTE — Patient Instructions (Signed)
   We will start [place] for the fibromyalgia. Call for any dose adjustments.

## 2017-12-10 NOTE — Progress Notes (Signed)
Reason for visit: Peripheral neuropathy, fibromyalgia  Audrey [name] is an 77 y.o. female  History of present illness:  Audrey [name] is a 77 year old right-handed white female with a history of a peripheral neuropathy that has been well controlled with the use of gabapentin taking 300 mg twice during the day and 600 mg at night.  The patient is tolerating the gabapentin quite well.  She has had fibromyalgia since she was in her 64s, she indicates that her fibromyalgia pain is worsening.  She has been on Cymbalta in the past and did not tolerate the medication.  She has been on Effexor and did tolerate it but was not sure why she stopped the medication.  She tries to do yoga once or twice a week which seems to help.  The patient is having difficulty remaining active.  She may have significant episodes of severe pain that may last up to a week.  She returns to the office today for an evaluation.  Past Medical History:  Diagnosis Date  . Allergy   . Arthritis   . Bladder infection    hx of last one approx 5 years ago   . 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 Cancer ([place])    breast  . Complication of anesthesia    difficulty awakening   . Diastolic dysfunction   . GERD (gastroesophageal reflux disease)   . 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 Hypertension   . Obesity   . PONV (postoperative nausea and vomiting)   . Reflux    buring and dysphagia sx resp to PPI; egd 3/06 neg for Barretts  . 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 Yeast infection    hx of     Past Surgical History:  Procedure Laterality Date  . AXILLARY LYMPH NODE BIOPSY Left 12/27/2015   Procedure: LEFT AXILLARY SENTINEL LYMPH NODE BIOPSY;  Surgeon: [name], MD;  Location: [place];  Service: General;  Laterality: Left;  . BELPHAROPTOSIS REPAIR Bilateral   . BREAST LUMPECTOMY WITH RADIOACTIVE SEED  LOCALIZATION Left 12/01/2015   Procedure: LEFT BREAST [place] RADIOACTIVE SEED LOCALIZATION;  Surgeon: [name], MD;  Location: [place];  Service: General;  Laterality: Left;  . COLONOSCOPY  3/06   NEG  . cortisone injections     right arm; back  . EYE SURGERY     cosmetic; eyelid surgery bilat   . FINGER SURGERY     left index finger/ cyst 02/2007  . KNEE ARTHROSCOPY Right 04/13/2015   Procedure: ARTHROSCOPY RIGHT KNEE WITH MEDIAL MENISCAL DEBRIDEMENT AND CHONDROPLASTY;  Surgeon: Gaynelle Arabian, MD;  [facility];  Service: Orthopedics;  Laterality: Right;  . left rotator cuff repair     12/2006  . LUMBAR LAMINECTOMY    . TONSILLECTOMY      Family History  Problem Relation Age of Onset  . Diabetes Mother   . Breast cancer Mother        dx. late 53s-early 47s; "metastasis to throat"?  . Hodgkin's lymphoma Father 68  . Breast cancer Sister        maternal half-sister dx. w/ DCIS in her late 17s;  . Other Sister        learning disabilities; lives on her own  . Diabetes Sister   . Breast cancer Maternal Aunt        dx. 89s  .  Ovarian cancer Maternal Aunt 79  . Breast cancer Paternal Aunt        dx. early 53s  . Stroke Maternal Grandfather   . Depression Paternal Grandmother   . Heart Problems Paternal Grandmother   . Alcohol abuse Brother   . Skin cancer Daughter        dx. early 64s  . Other Daughter        insterstitial cystitis  . Heart Problems Paternal Aunt     Social history:  reports that  has never smoked. she has never used smokeless tobacco. She reports that she does not drink alcohol or use drugs.    Allergies  Allergen Reactions  . Cymbalta [Duloxetine Hcl]     Diarrhea  . Other     Dermabond & Cat gut  . Macrodantin [Nitrofurantoin] Rash    Medications:  Prior to Admission medications   Medication Sig Start Date End Date Taking? Authorizing Provider  Calcium Carb-Cholecalciferol (CALCIUM + D3) 600-200 MG-UNIT TABS Take 1 tablet by mouth daily.     Yes [provider]  calcium carbonate (TUMS - DOSED IN MG ELEMENTAL CALCIUM) 500 MG chewable tablet Chew 1 tablet by mouth daily as needed for indigestion.    Yes [provider]  Cholecalciferol (VITAMIN D3 PO) Take 2,000 Units by mouth daily.   Yes [provider]  Co-Enzyme Q10 200 MG CAPS Take 1 capsule by mouth daily.    Yes [provider]  Cranberry 200 MG CAPS Take 1 capsule by mouth daily.    Yes [provider]  Cyanocobalamin (VITAMIN B-12) 5000 MCG SUBL Place 1 tablet under the tongue daily.   Yes [provider]  gabapentin (NEURONTIN) 300 MG capsule ONE CAPSULE TWICE DURING THE DAY AND 2 AT NIGHT 01/18/17  Yes Kathrynn Ducking, MD  hydrochlorothiazide (MICROZIDE) 12.5 MG capsule Take 1 capsule (12.5 mg total) by mouth daily. 08/26/14  Yes Turner, Eber Hong, MD  levothyroxine (SYNTHROID, LEVOTHROID) 25 MCG tablet Take 25 mcg by mouth 4 (four) times a week.   Yes [provider]  loratadine (CLARITIN) 10 MG tablet Take 10 mg by mouth daily as needed. Reported on 05/03/2016   Yes [provider]  meloxicam (MOBIC) 15 MG tablet Take 15 mg by mouth every morning. 11/14/16  Yes [provider]  Multiple Vitamins-Minerals (PRESERVISION AREDS 2) CAPS Take 1 tablet by mouth 2 (two) times daily.    Yes [provider]  nebivolol (BYSTOLIC) 5 MG tablet Take 5 mg by mouth daily.   Yes [provider]  non-metallic deodorant Jethro Poling) MISC Apply 1 application topically daily as needed. 01/24/16  Yes Kyung Rudd, MD  pyridOXINE (VITAMIN B-6) 100 MG tablet Take 100 mg by mouth daily.   Yes [provider]    ROS:  Out of a complete 14 system review of symptoms, the patient complains only of the following symptoms, and all other reviewed systems are negative.  Joint pain, aching muscles Food allergies  Blood pressure 139/86, pulse 76, height 4\' 11"  (1.499 m), weight 166 lb 8 oz (75.5  kg).  Physical Exam  General: The patient is alert and cooperative at the time of the examination.  The patient is moderately obese.  Skin: No significant peripheral edema is noted.   Neurologic Exam  Mental status: The patient is alert and oriented x 3 at the time of the examination. The patient has apparent normal recent and remote memory, with an apparently normal attention  span and concentration ability.   Cranial nerves: Facial symmetry is present. Speech is normal, no aphasia or dysarthria is noted. Extraocular movements are full. Visual fields are full.  Motor: The patient has good strength in all 4 extremities.  Sensory examination: Soft touch sensation is symmetric on the face, arms, and legs.  Coordination: The patient has good finger-nose-finger and heel-to-shin bilaterally.  Gait and station: The patient has a normal gait. Tandem gait is minimally unsteady.  Romberg is negative. No drift is seen.  Reflexes: Deep tendon reflexes are symmetric.   Assessment/Plan:  1.  Peripheral neuropathy  2.  Fibromyalgia  The patient is having increased discomfort from her fibromyalgia.  We will try her on Savella to see if this is effective.  The patient will start on very low-dose taking 12.5 mg daily, she will call for any dose adjustments.  The patient will continue her gabapentin at the current dose, a prescription for the medication was sent in.  She will otherwise follow-up in 1 year.  Jill Alexanders MD 12/10/2017 2:39 PM  [facility] [address] [place], [place] [address]  Phone [phone] Fax [phone]

## 2017-12-18 DIAGNOSIS — E7800 Pure hypercholesterolemia, unspecified: Secondary | ICD-10-CM | POA: Diagnosis not present

## 2017-12-18 DIAGNOSIS — E039 Hypothyroidism, unspecified: Secondary | ICD-10-CM | POA: Diagnosis not present

## 2017-12-18 DIAGNOSIS — R7982 Elevated C-reactive protein (CRP): Secondary | ICD-10-CM | POA: Diagnosis not present

## 2017-12-18 DIAGNOSIS — I10 Essential (primary) hypertension: Secondary | ICD-10-CM | POA: Diagnosis not present

## 2017-12-25 ENCOUNTER — Telehealth: Payer: Self-pay

## 2017-12-25 NOTE — Telephone Encounter (Signed)
Called and spoke with husband concerning appointment on 2/22. Per reschedeuling.

## 2018-01-03 ENCOUNTER — Ambulatory Visit: Payer: PPO | Admitting: Hematology

## 2018-01-03 ENCOUNTER — Other Ambulatory Visit: Payer: PPO

## 2018-01-07 DIAGNOSIS — M5137 Other intervertebral disc degeneration, lumbosacral region: Secondary | ICD-10-CM | POA: Diagnosis not present

## 2018-01-07 DIAGNOSIS — M4316 Spondylolisthesis, lumbar region: Secondary | ICD-10-CM | POA: Diagnosis not present

## 2018-01-07 DIAGNOSIS — M549 Dorsalgia, unspecified: Secondary | ICD-10-CM | POA: Diagnosis not present

## 2018-01-07 DIAGNOSIS — M5136 Other intervertebral disc degeneration, lumbar region: Secondary | ICD-10-CM | POA: Diagnosis not present

## 2018-01-07 DIAGNOSIS — M4317 Spondylolisthesis, lumbosacral region: Secondary | ICD-10-CM | POA: Diagnosis not present

## 2018-01-07 DIAGNOSIS — M546 Pain in thoracic spine: Secondary | ICD-10-CM | POA: Diagnosis not present

## 2018-01-09 DIAGNOSIS — M48061 Spinal stenosis, lumbar region without neurogenic claudication: Secondary | ICD-10-CM | POA: Diagnosis not present

## 2018-01-09 DIAGNOSIS — M5416 Radiculopathy, lumbar region: Secondary | ICD-10-CM | POA: Diagnosis not present

## 2018-01-13 ENCOUNTER — Telehealth: Payer: Self-pay | Admitting: Neurology

## 2018-01-13 MED ORDER — MILNACIPRAN HCL 25 MG PO TABS: 25.0000 mg | ORAL_TABLET | Freq: Two times a day (BID) | ORAL | 3 refills | Status: DC

## 2018-01-13 NOTE — Telephone Encounter (Signed)
I called the patient.  The Savella at the 12.5 mg dose was well tolerated.  We will go to 25 mg daily for 2 weeks and then go to 25 mg twice daily.  A prescription was sent in.

## 2018-01-13 NOTE — Addendum Note (Signed)
Addended by: Kathrynn Ducking on: 01/13/2018 01:37 PM   Modules accepted: Orders

## 2018-01-13 NOTE — Telephone Encounter (Signed)
Pt has called stating that she was told by Dr Jannifer Franklin to call and speak with his RN re: one of pt's medications, please call(pt has now gone on to mention this is re: Milnacipran HCl (SAVELLA) 12.5 MG TABS) Dr Jannifer Franklin is to increase it since she has not had any problems with initial dose.  Please call

## 2018-01-15 NOTE — Progress Notes (Signed)
[place]  [phone]) [phone] Fax:([phone]) [phone]  Clinic follow Up Note   Patient Care Team: [name], MD as PCP - General (Internal Medicine) Sueanne Margarita, MD as Consulting Physician (Cardiology) Jodi Marble, MD as Consulting Physician (Otolaryngology) [name], MD as Consulting Physician (General Surgery) Truitt Merle, MD as Consulting Physician (Hematology) Kyung Rudd, MD as Consulting Physician (Radiation Oncology) Sylvan Cheese, NP as Nurse Practitioner (Hematology and Oncology) Aloha Gell, MD as Consulting Physician (Obstetrics and Gynecology) 01/17/2018   CHIEF COMPLAINTS:  Follow up left breast cancer   Oncology History   Breast cancer of upper-outer quadrant of left female breast [facility])   Staging form: Breast, AJCC 7th Edition     Pathologic stage from 11/29/2015: Stage IA (T1c, N0, cM0) - Signed by Truitt Merle, MD on 01/14/2016     Clinical: Stage IA (T1c, N0, M0) - Unsigned        Breast cancer of upper-outer quadrant of left female breast ([place])   07/18/2014 Procedure    Hereditary cancer panel: no deleterious mutation at ATM, BARD1, BRCA1, BRCA2, BRIP1, CDH1, CHEK2, EPCAM, MLH1, MSH2, MSH6, NBN, PALB2, PMS2, PTEN, RAD51C, RAD51D, STK11, and TP53.      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      12/27/2015 Pathology Results    Second surgery with sentinel lymph node biopsy showed no  malignant cells in 5 lymph nodes      01/23/2016 - 02/17/2016 Radiation Therapy    Adjuvant left Breast radiation: Left breast treated to 42.5 Gy in 17 fractions, Left breast boost treated to 7.5 Gy in 3 fractions        Anti-estrogen oral therapy    She declined adjuvant AI or tamoxifen       05/03/2016 Survivorship    SCP visit completed       HISTORY OF PRESENTING ILLNESS:  Audrey [name] 77 y.o. female is here because of her recently diagnosed left breast cancer.  This was dicovered by screening mammogram on 10/13/2015, which showed a 1.3 cm irregular focal asymmetry in the 1:00 position of left breast. She underwent diagnostic mammogram and ultrasound and core needle biopsy, which showed atypical ductal hyperplasia. She was referred to breast surgeon Dr. Lucia Gaskins. She underwent left breast lumpectomy on 11/30/2014, no sentinel lymph nodes. 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  She has recovered very well, she took a few tablets of Tylenol after surgery, the pain has resolved. She is back to her normal exercise routine without any issues.  She has arthritis, especially knee, shoudler and hands, takes Aleve evarage once a day. She is fairly active, exercise daily at home, including yaga and treadmill. She has good appetite and eating well, no recent weight change. No other complaints.   GYN HISTORY  Menarchal: 13 LMP: 1991 Contraceptive: no  HRT: no  G2P2: (+) breast  feeding   CURRENT THERAPY:  Surveillance  INTERIM HISTORY Audrey [name] turns for follow-up. She presents to the clinic today by herself. She reports she is doing well overall. She notes she has recently started savella for fibromyalgia that has been worse this past year but improved on savella. She states she has had an improved diet and she is trying to be more active.   Pt's last mammogram was at [facility] in Dec 2018 that was normal. She sees her PCP, Dr. Maudie Mercury regularly at [facility]. She reports her last colonoscopy was in 2016 and was negative.   On review of systems, pt denies abdominal pain, or any other complaints at this time. Pertinent positives are listed and detailed within the above HPI.   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   Yeast infection    hx of     SURGICAL HISTORY: Past Surgical History:  Procedure Laterality Date   AXILLARY LYMPH NODE BIOPSY Left 12/27/2015   Procedure: LEFT AXILLARY SENTINEL LYMPH NODE BIOPSY;  Surgeon: [name], MD;  Location: [place];  Service: General;  Laterality: Left;   [place] Bilateral    BREAST LUMPECTOMY WITH RADIOACTIVE SEED LOCALIZATION Left 12/01/2015   Procedure: LEFT BREAST [place] RADIOACTIVE SEED LOCALIZATION;  Surgeon: [name], MD;  Location: [place];  Service: General;  Laterality: Left;   COLONOSCOPY  3/06   NEG   cortisone injections     right arm; back   EYE SURGERY     cosmetic; eyelid surgery bilat    FINGER SURGERY     left index finger/ cyst 02/2007   KNEE ARTHROSCOPY Right 04/13/2015   Procedure: ARTHROSCOPY RIGHT KNEE WITH MEDIAL MENISCAL DEBRIDEMENT AND CHONDROPLASTY;  Surgeon: Gaynelle Arabian, MD;  Location: [facility];  Service: Orthopedics;  Laterality: Right;   left rotator cuff repair     12/2006   LUMBAR LAMINECTOMY     TONSILLECTOMY       SOCIAL HISTORY: Social History   Social History   Marital Status: Married    Spouse Name: N/A   Number of Children: 2, daughter 64, son 43   Years of Education: 5   Occupational History   retired    Social History Main Topics   Smoking status: Never Smoker    Smokeless tobacco: Never Used   Alcohol Use: No   Drug Use: No   Sexual Activity: Not on file   Other Topics Concern   Not on file   Social History Narrative   Patient is right handed.   Patient drinks 2-3 cups of caffeine per day.    FAMILY HISTORY: Family History  Problem Relation Age of Onset   Diabetes Mother    Breast cancer Mother  dx. late 40s-early 50s; "metastasis to throat"?   Hodgkin's lymphoma Father 12   Breast cancer Sister        maternal half-sister dx. w/ DCIS in her late 70s;   Other Sister        learning disabilities; lives on her own   Diabetes Sister    Breast cancer Maternal Aunt        dx. 40s   Ovarian cancer Maternal Aunt 59   Breast cancer Paternal Aunt        dx. early 54s   Stroke Maternal Grandfather    Depression Paternal Grandmother    Heart Problems Paternal Grandmother    Alcohol abuse Brother    Skin cancer Daughter        dx. early 37s   Other Daughter        insterstitial cystitis   Heart Problems Paternal Aunt     ALLERGIES:  is allergic to cymbalta [duloxetine hcl]; other; and macrodantin [nitrofurantoin].  MEDICATIONS:  Current Outpatient Medications  Medication Sig Dispense Refill   Calcium Carb-Cholecalciferol (CALCIUM + D3) 600-200 MG-UNIT TABS Take 1 tablet by mouth daily.      calcium carbonate (TUMS - DOSED IN MG ELEMENTAL CALCIUM) 500 MG chewable tablet Chew 1 tablet by mouth daily as needed for indigestion.      Cholecalciferol (VITAMIN D3 PO) Take 2,000 Units by mouth daily.     Co-Enzyme Q10 200 MG CAPS Take 1 capsule by mouth daily.      Cranberry 200 MG CAPS Take 1 capsule by mouth daily.       Cyanocobalamin (VITAMIN B-12) 5000 MCG SUBL Place 1 tablet under the tongue daily.     gabapentin (NEURONTIN) 300 MG capsule ONE CAPSULE TWICE DURING THE DAY AND 2 AT NIGHT 360 capsule 3   hydrochlorothiazide (MICROZIDE) 12.5 MG capsule Take 1 capsule (12.5 mg total) by mouth daily. 90 capsule 1   levothyroxine (SYNTHROID, LEVOTHROID) 25 MCG tablet Take 25 mcg by mouth 4 (four) times a week.     loratadine (CLARITIN) 10 MG tablet Take 10 mg by mouth daily as needed. Reported on 05/03/2016     meloxicam (MOBIC) 15 MG tablet Take 15 mg by mouth every morning.     Milnacipran HCl (SAVELLA) 25 MG TABS Take 1 tablet (25 mg total) by mouth 2 (two) times daily. 60 tablet 3   Multiple Vitamins-Minerals (PRESERVISION AREDS 2) CAPS Take 1 tablet by mouth 2 (two) times daily.      nebivolol (BYSTOLIC) 5 MG tablet Take 5 mg by mouth daily.     non-metallic deodorant (ALRA) MISC Apply 1 application topically daily as needed.     pyridOXINE (VITAMIN B-6) 100 MG tablet Take 100 mg by mouth daily.     No current facility-administered medications for this visit.     REVIEW OF SYSTEMS:   Constitutional: Denies fevers, chills or abnormal night sweats Eyes: Denies blurriness of vision, double vision or watery eyes Ears, nose, mouth, throat, and face: Denies mucositis or sore throat Respiratory: Denies cough, dyspnea or wheezes Cardiovascular: Denies palpitation, chest discomfort or lower extremity swelling Gastrointestinal:  Denies nausea, heartburn or change in bowel habits Skin: Denies abnormal skin rashes Lymphatics: Denies new lymphadenopathy or easy bruising Neurological:Denies numbness, tingling or new weaknesses Behavioral/Psych: Mood is stable, no new changes  All other systems were reviewed with the patient and are negative.  PHYSICAL EXAMINATION: ECOG PERFORMANCE STATUS: 0 - Asymptomatic  Vitals:   01/17/18 1120  BP: Marland Kitchen)  161/82  Pulse: 91  Resp: 18  Temp: 98.3 F (36.8 C)  SpO2:  97%   Filed Weights   01/17/18 1120  Weight: 166 lb 6.4 oz (75.5 kg)    GENERAL:alert, no distress and comfortable SKIN: skin color, texture, turgor are normal, no rashes or significant lesions EYES: normal, conjunctiva are pink and non-injected, sclera clear OROPHARYNX:no exudate, no erythema and lips, buccal mucosa, and tongue normal  NECK: supple, thyroid normal size, non-tender, without nodularity LYMPH:  no palpable lymphadenopathy in the cervical, axillary or inguinal LUNGS: clear to auscultation and percussion with normal breathing effort HEART: regular rate & rhythm and no murmurs and no lower extremity edema ABDOMEN:abdomen soft, non-tender and normal bowel sounds. Normal liver edge  Musculoskeletal:no cyanosis of digits and no clubbing  PSYCH: alert & oriented x 3 with fluent speech NEURO: no focal motor/sensory deficits BREAST: Two incisions in the left breast breast UOQ and axillary incision well healed. Fullness in between the axialla and the left breast incision. Scar tissue underneath. Otherwise negative exam.  LABORATORY DATA:  I have reviewed the data as listed CBC Latest Ref Rng & Units 01/17/2018 11/30/2016 08/03/2016  WBC 3.9 - 10.3 K/uL 7.2 7.2 8.2  Hemoglobin 11.6 - 15.9 g/dL 15.0 13.9 14.8  Hematocrit 34.8 - 46.6 % 43.1 39.0 42.2  Platelets 145 - 400 K/uL 355 332 345    CMP Latest Ref Rng & Units 01/17/2018 11/30/2016 08/03/2016  Glucose 70 - 140 mg/dL 95 103 112  BUN 7 - 26 mg/dL 10 14.2 10.9  Creatinine 0.60 - 1.10 mg/dL 0.84 0.8 0.9  Sodium 136 - 145 mmol/L 141 141 140  Potassium 3.5 - 5.1 mmol/L 3.2(L) 3.5 3.6  Chloride 98 - 109 mmol/L 100 - -  CO2 22 - 29 mmol/L 30(H) 29 28  Calcium 8.4 - 10.4 mg/dL 10.3 9.5 9.4  Total Protein 6.4 - 8.3 g/dL 7.2 7.1 7.1  Total Bilirubin 0.2 - 1.2 mg/dL 0.7 0.55 0.52  Alkaline Phos 40 - 150 U/L 65 71 69  AST 5 - 34 U/L _0 ALT 0 - 55 U/L _1 PATHOLOGY REPORT:  Diagnosis 10/25/2015 Breast, left, needle  core biopsy - ATYPICAL DUCTAL HYPERPLASIA. Microscopic Comment The findings are called to [facility]) on 10/26/2015. Dr. Donato Heinz has seen this case in consultation with agreement. (RH:kh 10/26/2015)   Diagnosis 12/01/2015 Breast, lumpectomy, left - INVASIVE DUCTAL CARCINOMA, SEE COMMENT. - INVASIVE TUMOR IS 8 MM FROM THE NEAREST MARGIN (SUPERIOR) - DUCTAL CARCINOMA IN SITU WITH NECROSIS AND CALCIFICATIONS. - IN SITU CARCINOMA IS 4 MM FROM THE NEAREST MARGIN (SUPERIOR) - PREVIOUS BIOPSY SITE TISSUE CHANGE. - SEE TUMOR SYNOPTIC TEMPLATE BELOW. Microscopic Comment BREAST, INVASIVE TUMOR, WITHOUT LYMPH NODES PRESENT Specimen, including laterality : Left breast without sentinel lymph node sampling Procedure: Lumpectomy Histologic type: Ductal Grade: 2 of 3 Tubule formation: 3 Nuclear pleomorphism: 2 Mitotic: 1 Tumor size (gross measurement): 1.2 cm Margins: Invasive, distance to closest margin: 8 mm In-situ, distance to closest margin: 4 mm If margin positive, focally or broadly: N/A Lymphovascular invasion: Absent Ductal carcinoma in situ: Present Grade: 2 of 3 Extensive intraductal component: Absent Lobular neoplasia: Absent Tumor focality: Unifocal Treatment effect: None If present, treatment effect in breast tissue, lymph nodes or both: N/A Extent of tumor: Skin: N/A Nipple: N/A Skeletal muscle: N/A Breast prognostic profile: Estrogen receptor: Pending and will be reported in an addendum Progesterone receptor: Pending and  will be reported in an addendum. Her 2 neu: Pending and will be reported in an addendum Ki-67: Pending and will be reported in an addendum. Non-neoplastic breast: Previous biopsy site tissue change, fibrocystic changes and microcalcifications. TNM: pT1c, pNX, pMX Comments: Multiple recut sections were examined. The absence of the myoepithelial layer was confirmed with smooth muscle myosin heavy chain, Calponin and  p63 immunostains (blocks 1A and 1C). (CR:kh 12-05-15)  Results: IMMUNOHISTOCHEMICAL AND MORPHOMETRIC ANALYSIS PERFORMED MANUALLY Estrogen Receptor: 90%, POSITIVE, STRONG STAINING INTENSITY Progesterone Receptor: 20%, POSITIVE, STRONG STAINING INTENSITY Proliferation Marker Ki67: 5%  Results: HER2 - NEGATIVE RATIO OF HER2/CEP17 SIGNALS 1.33 AVERAGE HER2 COPY NUMBER PER CELL 2.85  Diagnosis 12/27/2015 1. Fatty tissue BENIGN FIBROADIPOSE TISSUE 2. Lymph nodes, regional resection, Left axiliary node FIVE BENIGN LYMPH NODES (0/5)  ONCOTYPE Dx RS 7, which predicts 10- year distant recurrence risk of 6% with tamoxifen alone.  RADIOGRAPHIC STUDIES: I have personally reviewed the radiological images as listed and agreed with the findings in the report. No results found.   Diagnostic mammogram 10/31/17 IMPRESSION: There is no mammographic evidence of malignancy.   ASSESSMENT & PLAN:  77 y.o. Caucasian female, with past medical history of hypertension, arthritis, presented with screening discovered left breast cancer.  1. 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  -I previously reviewed the Oncotype test result, the recurrence score is 7, which is a low risk, she would not benefit from adjuvant chemotherapy.  -Giving the strong ER/PR positivity, I previously recommend her to have a aromatase inhibitor as adjuvant endocrine therapy to reduce risk of cancer recurrence. The benefit and risks were discussed with patient, she declined. -she has completed radiation  -Continue breast cancer surveillance, with annual screening mammogram, self exam, and routine office viists with labs. -I again encouraged her to eat healthy, and exercise regularly. -She is clinically doing well, exam was unremarkable, lab results reviewed, CBC is within normal  limits. CMP is pending. Last mammo in 10/2017 was normal. No evidence of recurrence. Continue surveillance. -F/u in 1 year with lab   2. HTN, arthritis,  -She will continue follow-up with her primary care physician, and exercise  3. Genetics -She has strong family history of breast and ovarian cancer. -She had genetic testing done at [facility] and was negative for BRCA1, BRCA2, CHECK2, ATM, BARD1, MLH1, MSH2, MSH6, PALB2, PMS2, PTEN, RAD51C, RAD51D, STK11 and TP53 -She is very interested in genetic counseling, and would like to know if she needs additional genetic testing. She has a daughter, she is very concerned about her daughter's risk of breast cancer.  -She was seen by our genetic counselor Ms. Boggs who did not recommend further more genetic testing.   4.  Fibromyalgia and body pain -She was recently diagnosed with fibromyalgia, her chronic body pain has improved with medication -Follow-up with PCP Dr. Maudie Mercury  5. Hypothyroidism -Managed by her PCP  Plan -Lab and follow-up in 1 year. -Continue yearly mammograms, next due in 10/2018   All questions were answered. The patient knows to call the clinic with any problems, questions or concerns. I spent 20 minutes counseling the patient face to face. The total time spent in the appointment was 25 minutes and more than 50% was on counseling.  This document serves as a record of services personally performed by Truitt Merle, MD. It was created on her behalf by Hinton Dyer  Waskiewicz, a trained Presenter, broadcasting. The creation of this record is based on the scribe's personal observations and the provider's statements to them.   I have reviewed the above documentation for accuracy and completeness, and I agree with the above.      Truitt Merle, MD       01/17/2018 1:30 PM

## 2018-01-17 ENCOUNTER — Inpatient Hospital Stay: Payer: PPO | Attending: Hematology

## 2018-01-17 ENCOUNTER — Inpatient Hospital Stay (HOSPITAL_BASED_OUTPATIENT_CLINIC_OR_DEPARTMENT_OTHER): Payer: PPO | Admitting: Hematology

## 2018-01-17 ENCOUNTER — Telehealth: Payer: Self-pay | Admitting: Hematology

## 2018-01-17 ENCOUNTER — Encounter: Payer: Self-pay | Admitting: Hematology

## 2018-01-17 VITALS — BP 161/82 | HR 91 | Temp 98.3°F | Resp 18 | Ht 59.0 in | Wt 166.4 lb

## 2018-01-17 DIAGNOSIS — I10 Essential (primary) hypertension: Secondary | ICD-10-CM

## 2018-01-17 DIAGNOSIS — K219 Gastro-esophageal reflux disease without esophagitis: Secondary | ICD-10-CM

## 2018-01-17 DIAGNOSIS — E669 Obesity, unspecified: Secondary | ICD-10-CM | POA: Insufficient documentation

## 2018-01-17 DIAGNOSIS — R001 Bradycardia, unspecified: Secondary | ICD-10-CM | POA: Diagnosis not present

## 2018-01-17 DIAGNOSIS — M797 Fibromyalgia: Secondary | ICD-10-CM

## 2018-01-17 DIAGNOSIS — Z923 Personal history of irradiation: Secondary | ICD-10-CM | POA: Diagnosis not present

## 2018-01-17 DIAGNOSIS — Z8041 Family history of malignant neoplasm of ovary: Secondary | ICD-10-CM | POA: Insufficient documentation

## 2018-01-17 DIAGNOSIS — G629 Polyneuropathy, unspecified: Secondary | ICD-10-CM | POA: Diagnosis not present

## 2018-01-17 DIAGNOSIS — E039 Hypothyroidism, unspecified: Secondary | ICD-10-CM

## 2018-01-17 DIAGNOSIS — Z853 Personal history of malignant neoplasm of breast: Secondary | ICD-10-CM | POA: Diagnosis not present

## 2018-01-17 DIAGNOSIS — C50412 Malignant neoplasm of upper-outer quadrant of left female breast: Secondary | ICD-10-CM

## 2018-01-17 DIAGNOSIS — M129 Arthropathy, unspecified: Secondary | ICD-10-CM | POA: Diagnosis not present

## 2018-01-17 DIAGNOSIS — Z170 Estrogen receptor positive status [ER+]: Principal | ICD-10-CM

## 2018-01-17 LAB — COMPREHENSIVE METABOLIC PANEL
ALBUMIN: 3.9 g/dL (ref 3.5–5.0)
ALT: 22 U/L (ref 0–55)
AST: 22 U/L (ref 5–34)
Alkaline Phosphatase: 65 U/L (ref 40–150)
Anion gap: 11 (ref 3–11)
BUN: 10 mg/dL (ref 7–26)
CHLORIDE: 100 mmol/L (ref 98–109)
CO2: 30 mmol/L — AB (ref 22–29)
CREATININE: 0.84 mg/dL (ref 0.60–1.10)
Calcium: 10.3 mg/dL (ref 8.4–10.4)
GFR calc Af Amer: >60 mL/min (ref >60)
GFR calc non Af Amer: >60 mL/min (ref >60)
Glucose, Bld: 95 mg/dL (ref 70–140)
POTASSIUM: 3.2 mmol/L — AB (ref 3.5–5.1)
SODIUM: 141 mmol/L (ref 136–145)
Total Bilirubin: 0.7 mg/dL (ref 0.2–1.2)
Total Protein: 7.2 g/dL (ref 6.4–8.3)

## 2018-01-17 LAB — CBC WITH DIFFERENTIAL/PLATELET
BASOS ABS: 0 10*3/uL (ref 0.0–0.1)
BASOS PCT: 0 %
EOS ABS: 0.2 10*3/uL (ref 0.0–0.5)
EOS PCT: 3 %
HCT: 43.1 % (ref 34.8–46.6)
Hemoglobin: 15 g/dL (ref 11.6–15.9)
Lymphocytes Relative: 24 %
Lymphs Abs: 1.7 10*3/uL (ref 0.9–3.3)
MCH: 31 pg (ref 25.1–34.0)
MCHC: 34.7 g/dL (ref 31.5–36.0)
MCV: 89.1 fL (ref 79.5–101.0)
MONO ABS: 0.6 10*3/uL (ref 0.1–0.9)
Monocytes Relative: 9 %
Neutro Abs: 4.6 10*3/uL (ref 1.5–6.5)
Neutrophils Relative %: 64 %
PLATELETS: 355 10*3/uL (ref 145–400)
RBC: 4.84 MIL/uL (ref 3.70–5.45)
RDW: 13.2 % (ref 11.2–14.5)
WBC: 7.2 10*3/uL (ref 3.9–10.3)

## 2018-01-17 NOTE — Telephone Encounter (Signed)
Appointments scheduled AVS/Calendar printed per 2/22 los

## 2018-01-22 DIAGNOSIS — M47816 Spondylosis without myelopathy or radiculopathy, lumbar region: Secondary | ICD-10-CM | POA: Diagnosis not present

## 2018-01-22 DIAGNOSIS — M4317 Spondylolisthesis, lumbosacral region: Secondary | ICD-10-CM | POA: Diagnosis not present

## 2018-01-22 DIAGNOSIS — M48062 Spinal stenosis, lumbar region with neurogenic claudication: Secondary | ICD-10-CM | POA: Diagnosis not present

## 2018-01-22 DIAGNOSIS — M4316 Spondylolisthesis, lumbar region: Secondary | ICD-10-CM | POA: Diagnosis not present

## 2018-02-06 DIAGNOSIS — M4726 Other spondylosis with radiculopathy, lumbar region: Secondary | ICD-10-CM | POA: Diagnosis not present

## 2018-02-06 DIAGNOSIS — M5136 Other intervertebral disc degeneration, lumbar region: Secondary | ICD-10-CM | POA: Diagnosis not present

## 2018-02-06 DIAGNOSIS — M48061 Spinal stenosis, lumbar region without neurogenic claudication: Secondary | ICD-10-CM | POA: Diagnosis not present

## 2018-04-28 DIAGNOSIS — M4317 Spondylolisthesis, lumbosacral region: Secondary | ICD-10-CM | POA: Diagnosis not present

## 2018-04-28 DIAGNOSIS — M47816 Spondylosis without myelopathy or radiculopathy, lumbar region: Secondary | ICD-10-CM | POA: Diagnosis not present

## 2018-04-28 DIAGNOSIS — M48062 Spinal stenosis, lumbar region with neurogenic claudication: Secondary | ICD-10-CM | POA: Diagnosis not present

## 2018-04-28 DIAGNOSIS — M4316 Spondylolisthesis, lumbar region: Secondary | ICD-10-CM | POA: Diagnosis not present

## 2018-04-29 ENCOUNTER — Ambulatory Visit: Payer: PPO | Attending: Neurosurgery | Admitting: Physical Therapy

## 2018-04-29 ENCOUNTER — Other Ambulatory Visit: Payer: Self-pay

## 2018-04-29 ENCOUNTER — Encounter: Payer: Self-pay | Admitting: Physical Therapy

## 2018-04-29 DIAGNOSIS — M545 Low back pain: Secondary | ICD-10-CM | POA: Insufficient documentation

## 2018-04-29 DIAGNOSIS — M6281 Muscle weakness (generalized): Secondary | ICD-10-CM | POA: Insufficient documentation

## 2018-04-29 DIAGNOSIS — R262 Difficulty in walking, not elsewhere classified: Secondary | ICD-10-CM | POA: Diagnosis not present

## 2018-04-29 DIAGNOSIS — G8929 Other chronic pain: Secondary | ICD-10-CM

## 2018-04-29 NOTE — Therapy (Signed)
[facility] [address], [place], [place], [address] Phone: [phone]   Fax:  [phone]  Physical Therapy Evaluation  Patient Details  Name: Audrey [name] MRN: [id number] Date of Birth: 1941/01/31 Referring Provider: Dr. Sherwood Gambler    Encounter Date: 04/29/2018  PT End of Session - 04/29/18 2138    Visit Number  1    Date for PT Re-Evaluation  06/10/18    Authorization Type  UMR    PT Start Time  1447    PT Stop Time  1530    PT Time Calculation (min)  43 min    Activity Tolerance  Patient tolerated treatment well       Past Medical History:  Diagnosis Date  . Allergy   . Arthritis   . Bladder infection    hx of last one approx 5 years ago   . Breast cancer ([place]) 10/25/15   left breast  ADH   . Cancer ([place])    breast  . Complication of anesthesia    difficulty awakening   . Diastolic dysfunction   . GERD (gastroesophageal reflux disease)   . 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 Hypertension   . Obesity   . PONV (postoperative nausea and vomiting)   . Reflux    buring and dysphagia sx resp to PPI; egd 3/06 neg for Barretts  . 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 Yeast infection    hx of     Past Surgical History:  Procedure Laterality Date  . AXILLARY LYMPH NODE BIOPSY Left 12/27/2015   Procedure: LEFT AXILLARY SENTINEL LYMPH NODE BIOPSY;  Surgeon: [name], MD;  Location: [place];  Service: General;  Laterality: Left;  . BELPHAROPTOSIS REPAIR Bilateral   . BREAST LUMPECTOMY WITH RADIOACTIVE SEED LOCALIZATION Left 12/01/2015   Procedure: LEFT BREAST [place] RADIOACTIVE SEED LOCALIZATION;  Surgeon: [name], MD;  Location: [facility];  Service: General;  Laterality: Left;  . COLONOSCOPY  3/06   NEG  . cortisone injections     right arm; back  . EYE SURGERY     cosmetic; eyelid surgery bilat   .  FINGER SURGERY     left index finger/ cyst 02/2007  . KNEE ARTHROSCOPY Right 04/13/2015   Procedure: ARTHROSCOPY RIGHT KNEE WITH MEDIAL MENISCAL DEBRIDEMENT AND CHONDROPLASTY;  Surgeon: Gaynelle Arabian, MD;  Location: [facility];  Service: Orthopedics;  Laterality: Right;  . left rotator cuff repair     12/2006  . LUMBAR LAMINECTOMY    . TONSILLECTOMY      There were no vitals filed for this visit.   Subjective Assessment - 04/29/18 1452    Subjective  Surgery 22 years ago by Dr. Sherwood Gambler cage fixation L4-5;  5 years ago had 3 injections which helped.  Had a shot 2 months ago with some relief.  Bad reaction to this injection.  Needs 6 weeks of PT to apply for surgery of  L3-4 and L5-S1.  Both feet numbness and tingling especially at night.      Pertinent History  Breast CA;  OA;  taken 15 years of yoga; peripheral neuropathy ;  osteopenia;  knee scope;  may have fibromyalgia    Limitations  House hold activities;Walking;Standing    How long can you sit comfortably?  depends on the chair;  lounger at home 1-2 hours    How long can you walk comfortably?  now < 1/2 mile    Diagnostic tests  arthritis lumbar spine    Patient Stated Goals  I want to be able to go walking with my husband    Currently in Pain?  Yes    Pain Score  2     Pain Location  Back    Pain Orientation  Lower    Pain Descriptors / Indicators  Sore    Pain Type  Chronic pain    Aggravating Factors   walking    Pain Relieving Factors  sometimes massages;  heat;  [place]          [facility] PT Assessment - 04/29/18 0001      Assessment   Medical Diagnosis  lumbar stenosis  With neurogenic claudication    Referring Provider  Dr. Sherwood Gambler     Onset Date/Surgical Date  -- worse over the last year    Next MD Visit  07/04/18    Prior Therapy  [facility] 1 year ago for fibromyalgia "didn't help b/c I do all the exercises in yoga"       Precautions   Precautions  Fall    Precaution Comments  -- osteopenia       Restrictions    Weight Bearing Restrictions  No      Balance Screen   Has the patient fallen in the past 6 months  Yes    How many times?  1 outside    Has the patient had a decrease in activity level because of a fear of falling?   Yes    Is the patient reluctant to leave their home because of a fear of falling?   No      Home Environment   Living Environment  Private residence    Living Arrangements  Spouse/significant other    Type of [place] to enter    Entrance Stairs-Number of Steps  1    [place]  Two level;Laundry or work area in basement    Alternate Therapist, sports of Steps  10      Prior Function   Level of Independence  Independent with basic ADLs    Leisure  yoga, sewing, walking, crafts, grandchildren       Observation/Other Assessments   Focus on Therapeutic Outcomes (FOTO)   56% limitation       Posture/Postural Control   Postural Limitations  Decreased lumbar lordosis;Increased thoracic kyphosis      AROM   Lumbar Flexion  WFLs    Lumbar Extension  20    Lumbar - Right Side Bend  20    Lumbar - Left Side Bend  30      Strength   Right Hip Flexion  4+/5    Right Hip Extension  4/5    Right Hip ABduction  4-/5    Left Hip Flexion  4+/5    Left Hip Extension  4+/5    Left Hip ABduction  4/5    Right Knee Flexion  5/5    Right Knee Extension  4/5    Left Knee Extension  4+/5    Lumbar Flexion  4/5 decreased activation of transverse abdominus    Lumbar Extension  4/5      Flexibility   Hamstrings  90 bil     Quadriceps  10 hip extension bil      Palpation   Palpation comment  numerous tender points in right gluteals and  piriformis      Transfers   Comments  instruction on log roll method for supine to sit and sit to supine for pain control and safety for osteoprenia                Objective measurements completed on examination: See above findings.              PT Education - 04/29/18 2136    Education  Details  osteopenia dos and don'ts;  log rolling;  discussed avoidance of sit ups secondary to lbp and osteopenia    Person(s) Educated  Patient    Methods  Explanation;Handout;Demonstration    Comprehension  Verbalized understanding;Returned demonstration       PT Short Term Goals - 04/29/18 2152      PT SHORT TERM GOAL #1   Title  The patient will express understanding of basic osteopenia precautions    Time  3    Period  Weeks    Status  New    Target Date  05/20/18      PT SHORT TERM GOAL #2   Title  The patient will report a 25% improvement in back pain with walking 1/4 mile    Time  3    Period  Weeks    Status  New      PT SHORT TERM GOAL #3   Title  The patient will demonstrate good technique with getting in/out of bed that reduces strain to her low back    Time  3    Period  Weeks    Status  New        PT Long Term Goals - 04/29/18 2157      PT LONG TERM GOAL #1   Title  The patient will be independent in safe self progression of HEP     Time  6    Period  Weeks    Status  New    Target Date  06/10/18      PT LONG TERM GOAL #2   Title  The patient will report a 50% improvement in back pain with walking 1/2 mile    Time  6    Period  Weeks    Status  New      PT LONG TERM GOAL #3   Title  Right hip abduction strength and knee extension strength to 4 to 4+/5 needed to ascend steps with right leg    Time  6    Period  Weeks    Status  New      PT LONG TERM GOAL #4   Title  FOTO functional outcome score improved from 56% limitation to 46% limitation indicating improved function with less pain    Time  6    Period  Weeks    Status  New             Plan - 04/29/18 2139    Clinical Impression Statement  The patient had lumbar cage fusion surgery 22 years ago.  She has successfully managed her symptoms for the last 15 years with yoga however over the last year she has had an increased in low back pain and right > left buttock pain.  She is very  limited in her walking distance to < 1/2 mile.  She reports the doctor recommended surgery for her but told her that her insurance would not cover it unless she had 6 weeks of PT first.  Her lumbar and hip flexibility  is very good from her many years of yoga but her core strength, especially transverse abdominus muscle activation is deficient.  She also has decreased right hip abduction and knee extension strength and she has difficulty ascending steps with her right leg.  She has numerous tender points in right gluteals and piriformis.  She had DN in the past with a good result and is receptive to working on the tender points in this region.  She also lacks knowledge of precautions with osteopenia.  She would benefit from additional education on safe movement and best exercises for building bone density.      History and Personal Factors relevant to plan of care:  numerous co-morbidities including prior back surgery, fibromyalgia, osteopenia, history of breast cancer and knee OA , peripheral neuropathy   Clinical Presentation  Evolving    Clinical Presentation due to:  worsening of symptoms over time    Clinical Decision Making  Moderate    Rehab Potential  Good    Clinical Impairments Affecting Rehab Potential  fibromyalgia;  history of CA no [place]    PT Frequency  2x / week    PT Duration  6 weeks    PT Treatment/Interventions  ADLs/Self Care Home Management;Cryotherapy;Electrical Stimulation;Moist Heat;Therapeutic activities;Therapeutic exercise;Patient/family education;Neuromuscular re-education;Manual techniques;Dry needling;Taping    PT Next Visit Plan  DN right gluteals and piriformis;  abdominal brace with low level LE movements;  review osteopenia precautions, encourage log roll;  right hip and knee strengthening;  sit to stands;  Pre-mod ES as needed for pain control     PT Home Exercise Plan  patient is satisfied with her yoga routine for stretching so will focus on HEP for core and right LE  strengthening    Consulted and Agree with Plan of Care  Patient       Patient will benefit from skilled therapeutic intervention in order to improve the following deficits and impairments:  Pain, Postural dysfunction, Increased fascial restricitons, Increased muscle spasms, Decreased activity tolerance, Decreased strength, Difficulty walking, Decreased knowledge of precautions, Impaired perceived functional ability  Visit Diagnosis: Chronic bilateral low back pain, with sciatica presence unspecified - Plan: PT plan of care cert/re-cert  Muscle weakness (generalized) - Plan: PT plan of care cert/re-cert  Difficulty in walking, not elsewhere classified - Plan: PT plan of care cert/re-cert     Problem List Patient Active Problem List   Diagnosis Date Noted  . Hereditary and idiopathic peripheral neuropathy 06/05/2016  . Abnormality of gait 06/05/2016  . Family history of breast cancer in female 02/03/2016  . Family history of ovarian cancer 02/03/2016  . Peripheral neuropathy 12/16/2015  . Breast cancer of upper-outer quadrant of left female breast ([place]) 12/12/2015  . Acute medial meniscal tear 04/12/2015  . Fibromyalgia 02/02/2015  . Tachycardia   . Diastolic dysfunction   . Essential hypertension, benign 03/31/2014  . Other specified cardiac dysrhythmias(427.89) 03/31/2014   Ruben Im, PT 04/29/18 10:03 PM Phone: [phone] Fax: [phone]  Alvera Singh 04/29/2018, 10:03 PM  [facility] [address] [place], [place], [address] Phone: [phone]   Fax:  [phone]  Name: CHARLSIE FLEEGER MRN: [id number] Date of Birth: 22-Apr-1941

## 2018-04-29 NOTE — Patient Instructions (Signed)
DO's and DON'T's   Avoid and/or Minimize positions of forward bending ( flexion)  Side bending and rotation of the trunk  Especially when movements occur together   When your back aches:   Don't sit down   Lie down on your back with a small pillow under your head and one under your knees or as outlined by our therapist. Or, lie in the 90/90 position ( on the floor with your feet and legs on the sofa with knees and hips bent to 90 degrees)  Tying or putting on your shoes:   Don't bend over to tie your shoes or put on socks.  Instead, bring one foot up, cross it over the opposite knee and bend forward (hinge) at the hips to so the task.  Keep your back straight.  If you cannot do this safely, then you need to use long handled assistive devices such as a shoehorn and sock puller.  Exercising:  Don't engage in ballistic types of exercise routines such as high-impact aerobics or jumping rope  Don't do exercises in the gym that bring you forward (abdominal crunches, sit-ups, touching your  toes, knee-to-chest, straight leg raising.)  Follow a regular exercise program that includes a variety of different weight-bearing activities, such as low-impact aerobics, T' ai chi or walking as your physical therapist advises  Do exercises that emphasize return to normal body alignment and strengthening of the muscles that keep your back straight, as outlined in this program or by your therapist  Household tasks:  Don't reach unnecessarily or twist your trunk when mopping, sweeping, vacuuming, raking, making beds, weeding gardens, getting objects ou of cupboards, etc.  Keep your broom, mop, vacuum, or rake close to you and mover your whole body as you move them. Walk over to the area on which you are working. Arrange kitchen, bathroom, and bedroom shelves so that frequently used items may be reached without excessive bending, twisting, and reaching.  Use a  sturdy stool if necessary.  Don't bend from the waist to pick up something up  Off the floor, out of the trunk of your car, or to brush your teeth, wash your face, etc.   Bend at the knees, keeping back straight as possible. Use a reacher if necessary.   Prevention of fracture is the so-called "BOTTOm -Line" in the management of OSTEOPOROSIS. Do not take unnecessary chances in movement. Once a compression fracture occurs, the process is very difficult to control; one fracture is frequently followed by many more.   

## 2018-05-01 ENCOUNTER — Encounter

## 2018-05-06 ENCOUNTER — Encounter: Payer: Self-pay | Admitting: Physical Therapy

## 2018-05-06 ENCOUNTER — Ambulatory Visit: Payer: PPO | Admitting: Physical Therapy

## 2018-05-06 DIAGNOSIS — R262 Difficulty in walking, not elsewhere classified: Secondary | ICD-10-CM

## 2018-05-06 DIAGNOSIS — G8929 Other chronic pain: Secondary | ICD-10-CM

## 2018-05-06 DIAGNOSIS — M6281 Muscle weakness (generalized): Secondary | ICD-10-CM

## 2018-05-06 DIAGNOSIS — M545 Low back pain: Secondary | ICD-10-CM | POA: Diagnosis not present

## 2018-05-06 NOTE — Therapy (Signed)
[facility] [address] [place], [place], [address] Phone: [phone]   Fax:  [phone]  Physical Therapy Treatment  Patient Details  Name: Audrey [name] MRN: [id number] Date of Birth: 03-24-1941 Referring Provider: Dr. Sherwood Gambler    Encounter Date: 05/06/2018  PT End of Session - 05/06/18 1925    Visit Number  2    Date for PT Re-Evaluation  06/10/18    Authorization Type  UMR    PT Start Time  1400    PT Stop Time  1450    PT Time Calculation (min)  50 min    Activity Tolerance  Patient tolerated treatment well       Past Medical History:  Diagnosis Date  . Allergy   . Arthritis   . Bladder infection    hx of last one approx 5 years ago   . Breast cancer ([place]) 10/25/15   left breast  ADH   . Cancer ([place])    breast  . Complication of anesthesia    difficulty awakening   . Diastolic dysfunction   . GERD (gastroesophageal reflux disease)   . 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 Hypertension   . Obesity   . PONV (postoperative nausea and vomiting)   . Reflux    buring and dysphagia sx resp to PPI; egd 3/06 neg for Barretts  . 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 Yeast infection    hx of     Past Surgical History:  Procedure Laterality Date  . AXILLARY LYMPH NODE BIOPSY Left 12/27/2015   Procedure: LEFT AXILLARY SENTINEL LYMPH NODE BIOPSY;  Surgeon: [name], MD;  Location: [facility];  Service: General;  Laterality: Left;  . BELPHAROPTOSIS REPAIR Bilateral   . BREAST LUMPECTOMY WITH RADIOACTIVE SEED LOCALIZATION Left 12/01/2015   Procedure: LEFT BREAST [place] RADIOACTIVE SEED LOCALIZATION;  Surgeon: [name], MD;  Location: [facility];  Service: General;  Laterality: Left;  . COLONOSCOPY  3/06   NEG  . cortisone injections     right arm; back  . EYE SURGERY     cosmetic; eyelid surgery bilat   .  FINGER SURGERY     left index finger/ cyst 02/2007  . KNEE ARTHROSCOPY Right 04/13/2015   Procedure: ARTHROSCOPY RIGHT KNEE WITH MEDIAL MENISCAL DEBRIDEMENT AND CHONDROPLASTY;  Surgeon: Gaynelle Arabian, MD;  [facility];  Service: Orthopedics;  Laterality: Right;  . left rotator cuff repair     12/2006  . LUMBAR LAMINECTOMY    . TONSILLECTOMY      There were no vitals filed for this visit.  Subjective Assessment - 05/06/18 1357    Subjective  I had yoga this morning and I think my leg is getting weaker.  I had trouble doing some of the moves.  My toes are tingling more too on both sides.      Pertinent History  Breast CA;  OA;  taken 15 years of yoga; peripheral neuropathy ;  osteopenia; right  knee scope;  may have fibromyalgia;   left knee pain     Currently in Pain?  Yes    Pain Score  3     Pain Location  Back    Pain Type  Chronic pain                       [facility] Adult PT Treatment/Exercise - 05/06/18  0001      Therapeutic Activites    Therapeutic Activities  ADL's    ADL's  log rolling, sit to stand      Neuro Re-ed    Neuro Re-ed Details   transverse abdominus muscle activation; quad and gluteal activation       Lumbar Exercises: Aerobic   Nustep  5 min L2      Lumbar Exercises: Seated   LAQ on Chair Limitations  red band knee extension 10x     Other Seated Lumbar Exercises  chair back push down 10x    Other Seated Lumbar Exercises  red band ankle DF with hip flexion 10x      Lumbar Exercises: Supine   Ab Set  10 reps    Bent Knee Raise  10 reps    Isometric Hip Flexion  10 reps    Other Supine Lumbar Exercises  abdominal brace with green band clams 10x    Other Supine Lumbar Exercises  abdominal brace with ball squeeze 10x      Moist Heat Therapy   Number Minutes Moist Heat  10 Minutes    Moist Heat Location  Hip      Manual Therapy   Soft tissue mobilization  right gluteals and piriformis       Trigger Point Dry Needling - 05/06/18 1924     Consent Given?  Yes    Education Handout Provided  Yes    Muscles Treated Lower Body  Gluteus minimus;Gluteus maximus;Piriformis    Gluteus Maximus Response  Twitch response elicited;Palpable increased muscle length    Gluteus Minimus Response  Twitch response elicited;Palpable increased muscle length    Piriformis Response  Twitch response elicited;Palpable increased muscle length      Right       PT Short Term Goals - 04/29/18 2152      PT SHORT TERM GOAL #1   Title  The patient will express understanding of basic osteopenia precautions    Time  3    Period  Weeks    Status  New    Target Date  05/20/18      PT SHORT TERM GOAL #2   Title  The patient will report a 25% improvement in back pain with walking 1/4 mile    Time  3    Period  Weeks    Status  New      PT SHORT TERM GOAL #3   Title  The patient will demonstrate good technique with getting in/out of bed that reduces strain to her low back    Time  3    Period  Weeks    Status  New        PT Long Term Goals - 04/29/18 2157      PT LONG TERM GOAL #1   Title  The patient will be independent in safe self progression of HEP     Time  6    Period  Weeks    Status  New    Target Date  06/10/18      PT LONG TERM GOAL #2   Title  The patient will report a 50% improvement in back pain with walking 1/2 mile    Time  6    Period  Weeks    Status  New      PT LONG TERM GOAL #3   Title  Right hip abduction strength and knee extension strength to 4 to 4+/5 needed to ascend steps  with right leg    Time  6    Period  Weeks    Status  New      PT LONG TERM GOAL #4   Title  FOTO functional outcome score improved from 56% limitation to 46% limitation indicating improved function with less pain    Time  6    Period  Weeks    Status  New            Plan - 05/06/18 1944    Clinical Impression Statement  The patient is able to activate transverse abdominus muscles with min verbal and tactile cues.  Good  coordination of breathing.  Continues to need cues for log rolling to avoid excessive spinal flexion and rotation.  Good response to open chain LE strengthening.  Numerous tender points noted in gluteals and piriformis muscles on right.  Improved muscle length noted following manual therapy and DN.  Therapist closely monitoring response with all interventions.      Rehab Potential  Good    Clinical Impairments Affecting Rehab Potential  fibromyalgia;  history of CA no [place]    PT Frequency  2x / week    PT Duration  6 weeks    PT Treatment/Interventions  ADLs/Self Care Home Management;Cryotherapy;Electrical Stimulation;Moist Heat;Therapeutic activities;Therapeutic exercise;Patient/family education;Neuromuscular re-education;Manual techniques;Dry needling;Taping    PT Next Visit Plan  continue Nu-Step;  right LE strengthening;  assess response to DN right gluteals and piriformis #1;  abdominal brace with low level LE movements;  review osteopenia precautions, encourage log roll;  right hip and knee strengthening;  sit to stands;  Pre-mod ES as needed for pain control     PT Home Exercise Plan  patient is satisfied with her yoga routine for stretching so will focus on HEP for core and right LE strengthening       Patient will benefit from skilled therapeutic intervention in order to improve the following deficits and impairments:  Pain, Postural dysfunction, Increased fascial restricitons, Increased muscle spasms, Decreased activity tolerance, Decreased strength, Difficulty walking, Decreased knowledge of precautions, Impaired perceived functional ability  Visit Diagnosis: Chronic bilateral low back pain, with sciatica presence unspecified  Muscle weakness (generalized)  Difficulty in walking, not elsewhere classified     Problem List Patient Active Problem List   Diagnosis Date Noted  . Hereditary and idiopathic peripheral neuropathy 06/05/2016  . Abnormality of gait 06/05/2016  . Family  history of breast cancer in female 02/03/2016  . Family history of ovarian cancer 02/03/2016  . Peripheral neuropathy 12/16/2015  . Breast cancer of upper-outer quadrant of left female breast ([place]) 12/12/2015  . Acute medial meniscal tear 04/12/2015  . Fibromyalgia 02/02/2015  . Tachycardia   . Diastolic dysfunction   . Essential hypertension, benign 03/31/2014  . Other specified cardiac dysrhythmias(427.89) 03/31/2014   Ruben Im, PT 05/06/18 7:49 PM Phone: [phone] Fax: [phone]  Alvera Singh 05/06/2018, 7:49 PM  [facility] [address], [place], [place], [address] Phone: [phone]   Fax:  [phone]  Name: INDIE BOEHNE MRN: [id number] Date of Birth: 1941/10/20

## 2018-05-06 NOTE — Patient Instructions (Signed)
             Figure 8 with red bed around feet:          Figure 8: toes up/knee up  10x each leg      Trigger Point Dry Needling  . What is Trigger Point Dry Needling (DN)? o DN is a physical therapy technique used to treat muscle pain and dysfunction. Specifically, DN helps deactivate muscle trigger points (muscle knots).  o A thin filiform needle is used to penetrate the skin and stimulate the underlying trigger point. The goal is for a local twitch response (LTR) to occur and for the trigger point to relax. No medication of any kind is injected during the procedure.   . What Does Trigger Point Dry Needling Feel Like?  o The procedure feels different for each individual patient. Some patients report that they do not actually feel the needle enter the skin and overall the process is not painful. Very mild bleeding may occur. However, many patients feel a deep cramping in the muscle in which the needle was inserted. This is the local twitch response.   Marland Kitchen How Will I feel after the treatment? o Soreness is normal, and the onset of soreness may not occur for a few hours. Typically this soreness does not last longer than two days.  o Bruising is uncommon, however; ice can be used to decrease any possible bruising.  o In rare cases feeling tired or nauseous after the treatment is normal. In addition, your symptoms may get worse before they get better, this period will typically not last longer than 24 hours.   . What Can I do After My Treatment? o Increase your hydration by drinking more water for the next 24 hours. o You may place ice or heat on the areas treated that have become sore, however, do not use heat on inflamed or bruised areas. Heat often brings more relief post needling. o You can continue your regular activities, but vigorous activity is not recommended initially after the treatment for 24 hours. o DN is best combined with other physical therapy such as  strengthening, stretching, and other therapies.   Ruben Im PT [facility] [address], [place], [place] [address] Phone # [phone] Fax [phone]

## 2018-05-08 ENCOUNTER — Ambulatory Visit: Payer: PPO | Admitting: Physical Therapy

## 2018-05-08 ENCOUNTER — Encounter: Payer: Self-pay | Admitting: Physical Therapy

## 2018-05-08 DIAGNOSIS — G8929 Other chronic pain: Secondary | ICD-10-CM

## 2018-05-08 DIAGNOSIS — M545 Low back pain: Secondary | ICD-10-CM | POA: Diagnosis not present

## 2018-05-08 DIAGNOSIS — M6281 Muscle weakness (generalized): Secondary | ICD-10-CM

## 2018-05-08 DIAGNOSIS — R262 Difficulty in walking, not elsewhere classified: Secondary | ICD-10-CM

## 2018-05-08 NOTE — Therapy (Signed)
[facility] [address] [place], [place], [address] Phone: [phone]   Fax:  [phone]  Physical Therapy Treatment  Patient Details  Name: Audrey [name] MRN: [id number] Date of Birth: July 25, 1941 Referring Provider: Dr. Sherwood Gambler    Encounter Date: 05/08/2018  PT End of Session - 05/08/18 1941    Visit Number  3    Date for PT Re-Evaluation  06/10/18    Authorization Type  UMR    PT Start Time  0932    PT Stop Time  1025    PT Time Calculation (min)  53 min    Activity Tolerance  Patient tolerated treatment well       Past Medical History:  Diagnosis Date  . Allergy   . Arthritis   . Bladder infection    hx of last one approx 5 years ago   . Breast cancer ([place]) 10/25/15   left breast  ADH   . Cancer ([place])    breast  . Complication of anesthesia    difficulty awakening   . Diastolic dysfunction   . GERD (gastroesophageal reflux disease)   . 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 Hypertension   . Obesity   . PONV (postoperative nausea and vomiting)   . Reflux    buring and dysphagia sx resp to PPI; egd 3/06 neg for Barretts  . 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 Yeast infection    hx of     Past Surgical History:  Procedure Laterality Date  . AXILLARY LYMPH NODE BIOPSY Left 12/27/2015   Procedure: LEFT AXILLARY SENTINEL LYMPH NODE BIOPSY;  Surgeon: [name], MD;  Location: [place];  Service: General;  Laterality: Left;  . BELPHAROPTOSIS REPAIR Bilateral   . BREAST LUMPECTOMY WITH RADIOACTIVE SEED LOCALIZATION Left 12/01/2015   Procedure: LEFT BREAST [place] RADIOACTIVE SEED LOCALIZATION;  Surgeon: [name], MD;  Location: [place];  Service: General;  Laterality: Left;  . COLONOSCOPY  3/06   NEG  . cortisone injections     right arm; back  . EYE SURGERY     cosmetic; eyelid surgery bilat   .  FINGER SURGERY     left index finger/ cyst 02/2007  . KNEE ARTHROSCOPY Right 04/13/2015   Procedure: ARTHROSCOPY RIGHT KNEE WITH MEDIAL MENISCAL DEBRIDEMENT AND CHONDROPLASTY;  Surgeon: Gaynelle Arabian, MD;  Location: [facility];  Service: Orthopedics;  Laterality: Right;  . left rotator cuff repair     12/2006  . LUMBAR LAMINECTOMY    . TONSILLECTOMY      There were no vitals filed for this visit.  Subjective Assessment - 05/08/18 0935    Subjective  Denies soreness following last visit.  I like the dry needling.  I also had a massage yesterday.  I do feel like my knee/thigh might collapse.  It feels weak.  Just stiff this morning, no pain.  Last night I had some back pain.      Pertinent History  Breast CA;  OA;  taken 15 years of yoga; peripheral neuropathy ;  osteopenia; right  knee scope;  may have fibromyalgia;   left knee pain     Currently in Pain?  No/denies    Pain Score  0-No pain    Pain Location  Knee    Pain Orientation  Lower    Pain Type  Chronic pain  [place] Adult PT Treatment/Exercise - 05/08/18 0001      Therapeutic Activites    ADL's  log rolling, sit to stand      Neuro Re-ed    Neuro Re-ed Details   transverse abdominus muscle activation; quad and gluteal activation       Lumbar Exercises: Aerobic   Nustep  seat 4 10   min L3      Lumbar Exercises: Standing   Other Standing Lumbar Exercises  step ups right 10x    Other Standing Lumbar Exercises  hip extension to neutral red band 10x right and left       Lumbar Exercises: Seated   LAQ on Chair Limitations  red band knee extension 10x     Other Seated Lumbar Exercises  red band ankle DF with hip flexion 10x      Lumbar Exercises: Supine   Ab Set  5 reps    Bent Knee Raise  10 reps    Isometric Hip Flexion  5 reps    Other Supine Lumbar Exercises  isometric pushes hands and knee press into red ball       Moist Heat Therapy   Number Minutes Moist Heat  12 Minutes    Moist  Heat Location  Lumbar Spine;Hip      Electrical Stimulation   Electrical Stimulation Location  right lumbar, buttock, lateral hip, anterior thigh    Electrical Stimulation Action  IFC    Electrical Stimulation Parameters  10 ma supine 12 min    Electrical Stimulation Goals  Pain             PT Education - 05/08/18 1940    Education Details  step ups, step taps, hip extension red band     Person(s) Educated  Patient    Methods  Explanation;Demonstration;Handout    Comprehension  Returned demonstration;Verbalized understanding       PT Short Term Goals - 04/29/18 2152      PT SHORT TERM GOAL #1   Title  The patient will express understanding of basic osteopenia precautions    Time  3    Period  Weeks    Status  New    Target Date  05/20/18      PT SHORT TERM GOAL #2   Title  The patient will report a 25% improvement in back pain with walking 1/4 mile    Time  3    Period  Weeks    Status  New      PT SHORT TERM GOAL #3   Title  The patient will demonstrate good technique with getting in/out of bed that reduces strain to her low back    Time  3    Period  Weeks    Status  New        PT Long Term Goals - 04/29/18 2157      PT LONG TERM GOAL #1   Title  The patient will be independent in safe self progression of HEP     Time  6    Period  Weeks    Status  New    Target Date  06/10/18      PT LONG TERM GOAL #2   Title  The patient will report a 50% improvement in back pain with walking 1/2 mile    Time  6    Period  Weeks    Status  New      PT LONG TERM GOAL #3  Title  Right hip abduction strength and knee extension strength to 4 to 4+/5 needed to ascend steps with right leg    Time  6    Period  Weeks    Status  New      PT LONG TERM GOAL #4   Title  FOTO functional outcome score improved from 56% limitation to 46% limitation indicating improved function with less pain    Time  6    Period  Weeks    Status  New            Plan - 05/08/18  1941    Clinical Impression Statement  The patient is very receptive to exercise instruction and is able to activate transverse abdominus muscles with minimal verbal cues.  Able to participate in a progression of right LE strengthening without symptom aggravation.  She continues to need moderate cues for log rolling to avoid twisting her back with transitioning supine to sit and sit to supine.  Therapist closely monitoring response with all interventions.        Rehab Potential  Good    Clinical Impairments Affecting Rehab Potential  fibromyalgia;  history of CA no [place]    PT Frequency  2x / week    PT Duration  6 weeks    PT Treatment/Interventions  ADLs/Self Care Home Management;Cryotherapy;Electrical Stimulation;Moist Heat;Therapeutic activities;Therapeutic exercise;Patient/family education;Neuromuscular re-education;Manual techniques;Dry needling;Taping    PT Next Visit Plan  see how she liked ES to right thigh, hip  and back;  continue Nu-Step;  right LE strengthening;   DN right gluteals and piriformis;  abdominal brace with low level LE movements; encourage log roll;  right hip and knee strengthening;  sit to stands;  Pre-mod ES as needed for pain control        Patient will benefit from skilled therapeutic intervention in order to improve the following deficits and impairments:  Pain, Postural dysfunction, Increased fascial restricitons, Increased muscle spasms, Decreased activity tolerance, Decreased strength, Difficulty walking, Decreased knowledge of precautions, Impaired perceived functional ability  Visit Diagnosis: Chronic bilateral low back pain, with sciatica presence unspecified  Muscle weakness (generalized)  Difficulty in walking, not elsewhere classified     Problem List Patient Active Problem List   Diagnosis Date Noted  . Hereditary and idiopathic peripheral neuropathy 06/05/2016  . Abnormality of gait 06/05/2016  . Family history of breast cancer in female 02/03/2016   . Family history of ovarian cancer 02/03/2016  . Peripheral neuropathy 12/16/2015  . Breast cancer of upper-outer quadrant of left female breast ([place]) 12/12/2015  . Acute medial meniscal tear 04/12/2015  . Fibromyalgia 02/02/2015  . Tachycardia   . Diastolic dysfunction   . Essential hypertension, benign 03/31/2014  . Other specified cardiac dysrhythmias(427.89) 03/31/2014   Ruben Im, PT 05/08/18 7:47 PM Phone: [phone] Fax: [phone]  Alvera Singh 05/08/2018, 7:47 PM   [facility] [address] [place], [place], [address] Phone: [phone]   Fax:  [phone]  Name: ADREENA WILLITS MRN: [id number] Date of Birth: 03-20-1941

## 2018-05-08 NOTE — Patient Instructions (Addendum)
     Access Code: [email or web address]  URL: [email or web address]  Date: 05/08/2018  Prepared by: Ruben Im   Exercises  Step Up - 10 reps - 1 sets - 1x daily - 7x weekly  Step Taps on High Step - 10 reps - 1 sets - 1x daily - 7x weekly  Standing Hip Extension with Resistance at Ankles and Counter Support - 10 reps - 1 sets - 1x daily - 7x weekly     [facility] [address] [place], [place] [address] Phone # [phone] Fax [phone]

## 2018-05-13 ENCOUNTER — Encounter: Payer: Self-pay | Admitting: Physical Therapy

## 2018-05-13 ENCOUNTER — Ambulatory Visit: Payer: PPO | Admitting: Physical Therapy

## 2018-05-13 DIAGNOSIS — R262 Difficulty in walking, not elsewhere classified: Secondary | ICD-10-CM

## 2018-05-13 DIAGNOSIS — M545 Low back pain: Principal | ICD-10-CM

## 2018-05-13 DIAGNOSIS — G8929 Other chronic pain: Secondary | ICD-10-CM

## 2018-05-13 DIAGNOSIS — M6281 Muscle weakness (generalized): Secondary | ICD-10-CM

## 2018-05-13 NOTE — Patient Instructions (Signed)
   TENS UNIT  This is helpful for muscle pain and spasm.   Search and Purchase a TENS 7000 2nd edition at www.tenspros.com or www.amazon.com  (It should be less than $30)     TENS unit instructions:   Do not shower or bathe with the unit on  Turn the unit off before removing electrodes or batteries  If the electrodes lose stickiness add a drop of water to the electrodes after they are disconnected from the unit and place on plastic sheet. If you continued to have difficulty, call the TENS unit company to purchase more electrodes.  Do not apply lotion on the skin area prior to use. Make sure the skin is clean and dry as this will help prolong the life of the electrodes.  After use, always check skin for unusual red areas, rash or other skin difficulties. If there are any skin problems, does not apply electrodes to the same area.  Never remove the electrodes from the unit by pulling the wires.  Do not use the TENS unit or electrodes other than as directed.  Do not change electrode placement without consulting your therapist or physician.  Keep 2 fingers with between each electrode.   Mahina Salatino PT [facility] [address], [address] [place], [place] [address] Phone # [phone] Fax [phone] 

## 2018-05-13 NOTE — Therapy (Signed)
[facility] [address] [place], [place], [address] Phone: [phone]   Fax:  [phone]  Physical Therapy Treatment  Patient Details  Name: Audrey [name] MRN: [id number] Date of Birth: [birth year] Referring Provider: Dr. Sherwood Gambler    Encounter Date: 05/13/2018  PT End of Session - 05/13/18 1425    Visit Number  4  (Pended)     Date for PT Re-Evaluation  06/10/18  (Pended)     Authorization Type  UMR  (Pended)     PT Start Time  1400  (Pended)     PT Stop Time  1445  (Pended)     PT Time Calculation (min)  45 min  (Pended)     Activity Tolerance  Patient tolerated treatment well  (Pended)        Past Medical History:  Diagnosis Date  . Allergy   . Arthritis   . Bladder infection    hx of last one approx 5 years ago   . Breast cancer ([place]) 10/25/15   left breast  ADH   . Cancer ([place])    breast  . Complication of anesthesia    difficulty awakening   . Diastolic dysfunction   . GERD (gastroesophageal reflux disease)   . 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 Hypertension   . Obesity   . PONV (postoperative nausea and vomiting)   . Reflux    buring and dysphagia sx resp to PPI; egd 3/06 neg for Barretts  . 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 Yeast infection    hx of     Past Surgical History:  Procedure Laterality Date  . AXILLARY LYMPH NODE BIOPSY Left 12/27/2015   Procedure: LEFT AXILLARY SENTINEL LYMPH NODE BIOPSY;  Surgeon: [name], MD;  Location: [facility];  Service: General;  Laterality: Left;  . BELPHAROPTOSIS REPAIR Bilateral   . BREAST LUMPECTOMY WITH RADIOACTIVE SEED LOCALIZATION Left 12/01/2015   Procedure: LEFT BREAST [place] RADIOACTIVE SEED LOCALIZATION;  Surgeon: [name], MD;  Location: [facility];  Service: General;  Laterality: Left;  . COLONOSCOPY  3/06   NEG  . cortisone injections      right arm; back  . EYE SURGERY     cosmetic; eyelid surgery bilat   . FINGER SURGERY     left index finger/ cyst 02/2007  . KNEE ARTHROSCOPY Right 04/13/2015   Procedure: ARTHROSCOPY RIGHT KNEE WITH MEDIAL MENISCAL DEBRIDEMENT AND CHONDROPLASTY;  Surgeon: Gaynelle Arabian, MD;  Location[facility];  Service: Orthopedics;  Laterality: Right;  . left rotator cuff repair     12/2006  . LUMBAR LAMINECTOMY    . TONSILLECTOMY      There were no vitals filed for this visit.  Subjective Assessment - 05/13/18 1404    Subjective  Walking and up and down stairs aggravates me.  I was in a lot of pain yesterday in buttock and right thigh.  It was severe yesterday.  My husband had to bring the car closer at the [employer].      Pertinent History  Breast CA;  OA;  taken 15 years of yoga; peripheral neuropathy ;  osteopenia; right  knee scope;  may have fibromyalgia;   left knee pain;  MD Aug    Currently in Pain?  No/denies    Pain Score  0-No pain    Pain Location  Hip    Pain Orientation  Lower    Pain Type  Chronic pain    Pain Relieving Factors  heat, ES; DN                       [facility] Adult PT Treatment/Exercise - 05/13/18 0001      Therapeutic Activites    ADL's  log rolling, sit to stand      Neuro Re-ed    Neuro Re-ed Details   transverse abdominus muscle activation; quad and gluteal activation       Lumbar Exercises: Aerobic   Nustep  seat 4 10   min L3      Lumbar Exercises: Machines for Strengthening   Leg Press  70# bil 15x; single leg 50# 15x each       Lumbar Exercises: Supine   Other Supine Lumbar Exercises  opposite arm/leg with knee/hip on ball 10x       Moist Heat Therapy   Number Minutes Moist Heat  12 Minutes    Moist Heat Location  Lumbar Spine;Hip      Electrical Stimulation   Electrical Stimulation Location  right lumbar, buttock, lateral hip, anterior thigh    Electrical Stimulation Action  IFC    Electrical Stimulation Parameters  10 ma supine 12 min     Electrical Stimulation Goals  Pain      Manual Therapy   Joint Mobilization  right long leg hip distraction 3x 20 sec; inferior mob, AP in internal rotation grade 2/3 3x 20 sec each    Soft tissue mobilization  right gluteals and piriformis       Trigger Point Dry Needling - 05/13/18 1538    Consent Given?  Yes    Muscles Treated Lower Body  Tensor fascia lata    Gluteus Maximus Response  Twitch response elicited;Palpable increased muscle length    Gluteus Minimus Response  Twitch response elicited;Palpable increased muscle length    Piriformis Response  Twitch response elicited;Palpable increased muscle length    Tensor Fascia Lata Response  Twitch response elicited;Palpable increased muscle length           PT Education - 05/13/18 1548    Education Details  info on home TENS    Person(s) Educated  Patient    Methods  Explanation;Handout    Comprehension  Verbalized understanding       PT Short Term Goals - 05/13/18 1547      PT SHORT TERM GOAL #1   Title  The patient will express understanding of basic osteopenia precautions    Status  Achieved      PT SHORT TERM GOAL #2   Title  The patient will report a 25% improvement in back pain with walking 1/4 mile    Time  3    Period  Weeks    Status  On-going      PT SHORT TERM GOAL #3   Title  The patient will demonstrate good technique with getting in/out of bed that reduces strain to her low back    Status  Achieved        PT Long Term Goals - 04/29/18 2157      PT LONG TERM GOAL #1   Title  The patient will be independent in safe self progression of HEP     Time  6    Period  Weeks    Status  New    Target Date  06/10/18      PT LONG TERM  GOAL #2   Title  The patient will report a 50% improvement in back pain with walking 1/2 mile    Time  6    Period  Weeks    Status  New      PT LONG TERM GOAL #3   Title  Right hip abduction strength and knee extension strength to 4 to 4+/5 needed to ascend steps  with right leg    Time  6    Period  Weeks    Status  New      PT LONG TERM GOAL #4   Title  FOTO functional outcome score improved from 56% limitation to 46% limitation indicating improved function with less pain    Time  6    Period  Weeks    Status  New            Plan - 05/13/18 1539    Clinical Impression Statement  The patient is able to perform core and LE strengthening with minimal discomfort although she does a catch in buttock region following the leg press.  She notes her right LE feels weaker than her left.  Fewer tender points noted in gluteals and piriformis but she does have tenderness in TFL, lateral quads and HS.  Improved muscle length following manual therapy and DN.  Able to log roll without verbal cues today.  Progressing with rehab goals.  Therapist closely monitoring response with all treatment interventions.      Rehab Potential  Good    Clinical Impairments Affecting Rehab Potential  fibromyalgia;  history of CA no [place]    PT Frequency  2x / week    PT Duration  6 weeks    PT Treatment/Interventions  ADLs/Self Care Home Management;Cryotherapy;Electrical Stimulation;Moist Heat;Therapeutic activities;Therapeutic exercise;Patient/family education;Neuromuscular re-education;Manual techniques;Dry needling;Taping    PT Next Visit Plan  continue  ES to right thigh, hip  and back;  patient may get home TENS; continue Nu-Step;  right LE strengthening;   DN right gluteals and piriformis;  abdominal brace with low level LE movements; encourage log roll;  right hip and knee strengthening;  sit to stands;  Pre-mod ES as needed for pain control        Patient will benefit from skilled therapeutic intervention in order to improve the following deficits and impairments:  Pain, Postural dysfunction, Increased fascial restricitons, Increased muscle spasms, Decreased activity tolerance, Decreased strength, Difficulty walking, Decreased knowledge of precautions, Impaired perceived  functional ability  Visit Diagnosis: Chronic bilateral low back pain, with sciatica presence unspecified  Muscle weakness (generalized)  Difficulty in walking, not elsewhere classified     Problem List Patient Active Problem List   Diagnosis Date Noted  . Hereditary and idiopathic peripheral neuropathy 06/05/2016  . Abnormality of gait 06/05/2016  . Family history of breast cancer in female 02/03/2016  . Family history of ovarian cancer 02/03/2016  . Peripheral neuropathy 12/16/2015  . Breast cancer of upper-outer quadrant of left female breast ([place]) 12/12/2015  . Acute medial meniscal tear 04/12/2015  . Fibromyalgia 02/02/2015  . Tachycardia   . Diastolic dysfunction   . Essential hypertension, benign 03/31/2014  . Other specified cardiac dysrhythmias(427.89) 03/31/2014   Ruben Im, PT 05/13/18 3:49 PM Phone: [phone] Fax: [phone]  Alvera Singh 05/13/2018, 3:49 PM  [facility] [address] [place], [place], [address] Phone: [phone]   Fax:  [phone]  Name: Audrey [name] MRN: [id number] Date of Birth: 06-Aug-1941

## 2018-05-15 ENCOUNTER — Encounter: Payer: Self-pay | Admitting: Physical Therapy

## 2018-05-15 ENCOUNTER — Ambulatory Visit: Payer: PPO | Admitting: Physical Therapy

## 2018-05-15 DIAGNOSIS — M545 Low back pain: Principal | ICD-10-CM

## 2018-05-15 DIAGNOSIS — M6281 Muscle weakness (generalized): Secondary | ICD-10-CM

## 2018-05-15 DIAGNOSIS — G8929 Other chronic pain: Secondary | ICD-10-CM

## 2018-05-15 DIAGNOSIS — R262 Difficulty in walking, not elsewhere classified: Secondary | ICD-10-CM

## 2018-05-15 NOTE — Therapy (Addendum)
[facility] [address], [place], [place], [address] Phone: [phone]   Fax:  [phone]  Physical Therapy Treatment  Patient Details  Name: Audrey [name] MRN: [id number] Date of Birth: 1941/08/09 Referring Provider: Dr. Sherwood Gambler    Encounter Date: 05/15/2018  PT End of Session - 05/15/18 0934    Visit Number  5   Date for PT Re-Evaluation  06/10/18    Authorization Type  UMR    PT Start Time  0928    PT Stop Time  1015    PT Time Calculation (min)  47 min    Activity Tolerance  Patient tolerated treatment well       Past Medical History:  Diagnosis Date  . Allergy   . Arthritis   . Bladder infection    hx of last one approx 5 years ago   . Breast cancer ([place]) 10/25/15   left breast  ADH   . Cancer ([place])    breast  . Complication of anesthesia    difficulty awakening   . Diastolic dysfunction   . GERD (gastroesophageal reflux disease)   . 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 Hypertension   . Obesity   . PONV (postoperative nausea and vomiting)   . Reflux    buring and dysphagia sx resp to PPI; egd 3/06 neg for Barretts  . 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 Yeast infection    hx of     Past Surgical History:  Procedure Laterality Date  . AXILLARY LYMPH NODE BIOPSY Left 12/27/2015   Procedure: LEFT AXILLARY SENTINEL LYMPH NODE BIOPSY;  Surgeon: [name], MD;  Location: [place];  Service: General;  Laterality: Left;  . BELPHAROPTOSIS REPAIR Bilateral   . BREAST LUMPECTOMY WITH RADIOACTIVE SEED LOCALIZATION Left 12/01/2015   Procedure: LEFT BREAST [place] RADIOACTIVE SEED LOCALIZATION;  Surgeon: [name], MD;  Location: [place];  Service: General;  Laterality: Left;  . COLONOSCOPY  3/06   NEG  . cortisone injections     right arm; back  . EYE SURGERY     cosmetic; eyelid surgery bilat   .  FINGER SURGERY     left index finger/ cyst 02/2007  . KNEE ARTHROSCOPY Right 04/13/2015   Procedure: ARTHROSCOPY RIGHT KNEE WITH MEDIAL MENISCAL DEBRIDEMENT AND CHONDROPLASTY;  Surgeon: Gaynelle Arabian, MD;  Location: [facility];  Service: Orthopedics;  Laterality: Right;  . left rotator cuff repair     12/2006  . LUMBAR LAMINECTOMY    . TONSILLECTOMY      There were no vitals filed for this visit.  Subjective Assessment - 05/15/18 0931    Subjective  I ordered the TENs for home but hasn't arrived yet.  This time not as much difference following this session compared to first time.  No leg pain but soreness in low back.  Yesterday had thigh pain but no buttock pain.      Pertinent History  Breast CA;  OA;  taken 15 years of yoga; peripheral neuropathy ;  osteopenia; right  knee scope;  may have fibromyalgia;   left knee pain;  MD Aug    Currently in Pain?  Yes    Pain Score  3     Pain Location  Back    Pain Orientation  Lower    Pain Type  Chronic pain  [facility] Adult PT Treatment/Exercise - 05/15/18 0001      Therapeutic Activites    ADL's  log rolling, sit to stand      Neuro Re-ed    Neuro Re-ed Details   transverse abdominus muscle activation; quad and gluteal activation       Lumbar Exercises: Aerobic   Nustep  seat 4 10   min L3      Lumbar Exercises: Seated   Sit to Stand  10 reps no UEs    Other Seated Lumbar Exercises  5# weight chops, Vs, ear to ear, hip to hip 10x each    Other Seated Lumbar Exercises  green band rows and extensions 10x each      Moist Heat Therapy   Number Minutes Moist Heat  12 Minutes    Moist Heat Location  Lumbar Spine;Hip      Electrical Stimulation   Electrical Stimulation Location  bil lumbar and right hip and thigh    Electrical Stimulation Action  IFC    Electrical Stimulation Parameters  10 ma sidelying    Electrical Stimulation Goals  Pain      Manual Therapy   Soft tissue mobilization  bil lumbar  paraspinals ; quads       Trigger Point Dry Needling - 05/15/18 1959    Consent Given?  Yes    Muscles Treated Lower Body  Quadriceps bil lumbar multifidi     Quadriceps Response  Twitch response elicited;Palpable increased muscle length             PT Short Term Goals - 05/13/18 1547      PT SHORT TERM GOAL #1   Title  The patient will express understanding of basic osteopenia precautions    Status  Achieved      PT SHORT TERM GOAL #2   Title  The patient will report a 25% improvement in back pain with walking 1/4 mile    Time  3    Period  Weeks    Status  On-going      PT SHORT TERM GOAL #3   Title  The patient will demonstrate good technique with getting in/out of bed that reduces strain to her low back    Status  Achieved        PT Long Term Goals - 04/29/18 2157      PT LONG TERM GOAL #1   Title  The patient will be independent in safe self progression of HEP     Time  6    Period  Weeks    Status  New    Target Date  06/10/18      PT LONG TERM GOAL #2   Title  The patient will report a 50% improvement in back pain with walking 1/2 mile    Time  6    Period  Weeks    Status  New      PT LONG TERM GOAL #3   Title  Right hip abduction strength and knee extension strength to 4 to 4+/5 needed to ascend steps with right leg    Time  6    Period  Weeks    Status  New      PT LONG TERM GOAL #4   Title  FOTO functional outcome score improved from 56% limitation to 46% limitation indicating improved function with less pain    Time  6    Period  Weeks    Status  New  Plan - 05/15/18 2000    Clinical Impression Statement  The patient is much improved with log rolling to get on/off the table.  She is able to do core and LE strengthening without exacerbation of symptoms.   She expresses frustration of her continued pain but does state her symptoms have been improved with DN.  She continues to be highly motivated with PT.  Therapist closely  monitoring response with all interventions.     Rehab Potential  Good    Clinical Impairments Affecting Rehab Potential  fibromyalgia;  history of CA no [place]    PT Frequency  2x / week    PT Duration  6 weeks    PT Treatment/Interventions  ADLs/Self Care Home Management;Cryotherapy;Electrical Stimulation;Moist Heat;Therapeutic activities;Therapeutic exercise;Patient/family education;Neuromuscular re-education;Manual techniques;Dry needling;Taping    PT Next Visit Plan  check STGS next visit;  assess response to DN multifidi, quads;  did she get home TENS?; right LE strengthening;  core strengthening       Patient will benefit from skilled therapeutic intervention in order to improve the following deficits and impairments:  Pain, Postural dysfunction, Increased fascial restricitons, Increased muscle spasms, Decreased activity tolerance, Decreased strength, Difficulty walking, Decreased knowledge of precautions, Impaired perceived functional ability  Visit Diagnosis: Chronic bilateral low back pain, with sciatica presence unspecified  Muscle weakness (generalized)  Difficulty in walking, not elsewhere classified     Problem List Patient Active Problem List   Diagnosis Date Noted  . Hereditary and idiopathic peripheral neuropathy 06/05/2016  . Abnormality of gait 06/05/2016  . Family history of breast cancer in female 02/03/2016  . Family history of ovarian cancer 02/03/2016  . Peripheral neuropathy 12/16/2015  . Breast cancer of upper-outer quadrant of left female breast ([place]) 12/12/2015  . Acute medial meniscal tear 04/12/2015  . Fibromyalgia 02/02/2015  . Tachycardia   . Diastolic dysfunction   . Essential hypertension, benign 03/31/2014  . Other specified cardiac dysrhythmias(427.89) 03/31/2014   Ruben Im, PT 05/15/18 8:08 PM Phone: [phone] Fax: [phone]  Alvera Singh 05/15/2018, 8:08 PM  [facility] [address], [place], [place], [address] Phone: [phone]   Fax:  [phone]  Name: Audrey [name] MRN: [id number] Date of Birth: 04/11/1941

## 2018-05-16 DIAGNOSIS — C50912 Malignant neoplasm of unspecified site of left female breast: Secondary | ICD-10-CM | POA: Diagnosis not present

## 2018-05-22 ENCOUNTER — Ambulatory Visit: Payer: PPO | Admitting: Physical Therapy

## 2018-05-22 DIAGNOSIS — R262 Difficulty in walking, not elsewhere classified: Secondary | ICD-10-CM

## 2018-05-22 DIAGNOSIS — M545 Low back pain: Principal | ICD-10-CM

## 2018-05-22 DIAGNOSIS — M6281 Muscle weakness (generalized): Secondary | ICD-10-CM

## 2018-05-22 DIAGNOSIS — G8929 Other chronic pain: Secondary | ICD-10-CM

## 2018-05-22 NOTE — Therapy (Addendum)
[facility] [address] [place], [place], [address] Phone: [phone]   Fax:  [phone]  Physical Therapy Treatment  Patient Details  Name: Audrey [name] MRN: [id number] Date of Birth: 1941/07/23 Referring Provider: Dr. Sherwood Gambler    Encounter Date: 05/22/2018  PT End of Session - 05/22/18 1037    Visit Number  6    Date for PT Re-Evaluation  06/10/18    Authorization Type  UMR    PT Start Time  1015    PT Stop Time  1112    PT Time Calculation (min)  57 min    Activity Tolerance  Patient tolerated treatment well       Past Medical History:  Diagnosis Date  . Allergy   . Arthritis   . Bladder infection    hx of last one approx 5 years ago   . Breast cancer ([place]) 10/25/15   left breast  ADH   . Cancer ([place])    breast  . Complication of anesthesia    difficulty awakening   . Diastolic dysfunction   . GERD (gastroesophageal reflux disease)   . 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 Hypertension   . Obesity   . PONV (postoperative nausea and vomiting)   . Reflux    buring and dysphagia sx resp to PPI; egd 3/06 neg for Barretts  . 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 Yeast infection    hx of     Past Surgical History:  Procedure Laterality Date  . AXILLARY LYMPH NODE BIOPSY Left 12/27/2015   Procedure: LEFT AXILLARY SENTINEL LYMPH NODE BIOPSY;  Surgeon: [name], MD;  Location: [place];  Service: General;  Laterality: Left;  . BELPHAROPTOSIS REPAIR Bilateral   . BREAST LUMPECTOMY WITH RADIOACTIVE SEED LOCALIZATION Left 12/01/2015   Procedure: LEFT BREAST [place] RADIOACTIVE SEED LOCALIZATION;  Surgeon: [name], MD;  Location: [place];  Service: General;  Laterality: Left;  . COLONOSCOPY  3/06   NEG  . cortisone injections     right arm; back  . EYE SURGERY     cosmetic; eyelid surgery bilat   .  FINGER SURGERY     left index finger/ cyst 02/2007  . KNEE ARTHROSCOPY Right 04/13/2015   Procedure: ARTHROSCOPY RIGHT KNEE WITH MEDIAL MENISCAL DEBRIDEMENT AND CHONDROPLASTY;  Surgeon: Gaynelle Arabian, MD;  [facility];  Service: Orthopedics;  Laterality: Right;  . left rotator cuff repair     12/2006  . LUMBAR LAMINECTOMY    . TONSILLECTOMY      There were no vitals filed for this visit.  Subjective Assessment - 05/22/18 1018    Subjective  Got her TENs unit but hasn't used it yet.  I meant to bring it today but I forgot.  Had a couple of bad days with the back.  Yesterday morning was great. 3/10 in the afternoon.  On bad days it's a 6/10.      Pertinent History  Breast CA;  OA;  taken 15 years of yoga; peripheral neuropathy ;  osteopenia; right  knee scope;  may have fibromyalgia;   left knee pain;  MD Aug    Currently in Pain?  Yes    Pain Score  3     Pain Location  Back    Pain Orientation  Right    Pain Radiating Towards  right thigh     Aggravating  Factors   walking;  doing less then normal;  DN, TENS helps temporarily                        [facility] Adult PT Treatment/Exercise - 05/22/18 0001      Self-Care   Self-Care  Other Self-Care Comments    Other Self-Care Comments   discussed appropriate gym exercises  use of walking pole on left      Lumbar Exercises: Aerobic   Nustep  seat 4 10   min L3 while discussing status and progress     Lumbar Exercises: Machines for Strengthening   Leg Press  seat 5 70# bil 25x; single leg 50# 30x each     Other Lumbar Machine Exercise  seated rows 15# 15x    Other Lumbar Machine Exercise  seated lat bar 20# 15x      Lumbar Exercises: Standing   Other Standing Lumbar Exercises  --      Electrical Stimulation   Electrical Stimulation Location  bil lumbar and right hip and thigh    Electrical Stimulation Action  IFC    Electrical Stimulation Parameters  16 ma 13 min sidelying     Electrical Stimulation Goals  Pain       Manual Therapy   Soft tissue mobilization  bil lumbar paraspinals, gluteals ; quads       Trigger Point Dry Needling - 05/22/18 1033    Consent Given?  Yes    Muscles Treated Lower Body  -- bil lumbar multifidi    Gluteus Maximus Response  Twitch response elicited;Palpable increased muscle length    Piriformis Response  Twitch response elicited;Palpable increased muscle length    Quadriceps Response  Twitch response elicited;Palpable increased muscle length       Right.      PT Short Term Goals - 05/22/18 1158      PT SHORT TERM GOAL #1   Title  The patient will express understanding of basic osteopenia precautions    Status  Achieved      PT SHORT TERM GOAL #2   Title  The patient will report a 25% improvement in back pain with walking 1/4 mile    Time  3    Period  Weeks    Status  On-going      PT SHORT TERM GOAL #3   Title  The patient will demonstrate good technique with getting in/out of bed that reduces strain to her low back    Status  Achieved        PT Long Term Goals - 04/29/18 2157      PT LONG TERM GOAL #1   Title  The patient will be independent in safe self progression of HEP     Time  6    Period  Weeks    Status  New    Target Date  06/10/18      PT LONG TERM GOAL #2   Title  The patient will report a 50% improvement in back pain with walking 1/2 mile    Time  6    Period  Weeks    Status  New      PT LONG TERM GOAL #3   Title  Right hip abduction strength and knee extension strength to 4 to 4+/5 needed to ascend steps with right leg    Time  6    Period  Weeks    Status  New  PT LONG TERM GOAL #4   Title  FOTO functional outcome score improved from 56% limitation to 46% limitation indicating improved function with less pain    Time  6    Period  Weeks    Status  New            Plan - 05/22/18 1154    Clinical Impression Statement  The patient has decreased tender points in lumbar paraspinals, gluteals and piriformis  but has several points in lateral quads.  Improved soft tissue mobility following manual therapy and DN.  She continues to be highly motivated to exercise but frustrated in her continued difficulty walking on "bad days."  We discussed using her walking pole on the left for pain management and balance.  Therapist closely monitoring response with all interventions.      Rehab Potential  Good    Clinical Impairments Affecting Rehab Potential  fibromyalgia;  history of CA no [place]    PT Frequency  2x / week    PT Duration  6 weeks    PT Treatment/Interventions  ADLs/Self Care Home Management;Cryotherapy;Electrical Stimulation;Moist Heat;Therapeutic activities;Therapeutic exercise;Patient/family education;Neuromuscular re-education;Manual techniques;Dry needling;Taping    PT Next Visit Plan  DN multifidi, gluteals and quads; right LE strengthening;  core strengthening; check walking distance/tolerance       Patient will benefit from skilled therapeutic intervention in order to improve the following deficits and impairments:  Pain, Postural dysfunction, Increased fascial restricitons, Increased muscle spasms, Decreased activity tolerance, Decreased strength, Difficulty walking, Decreased knowledge of precautions, Impaired perceived functional ability  Visit Diagnosis: Chronic bilateral low back pain, with sciatica presence unspecified  Muscle weakness (generalized)  Difficulty in walking, not elsewhere classified     Problem List Patient Active Problem List   Diagnosis Date Noted  . Hereditary and idiopathic peripheral neuropathy 06/05/2016  . Abnormality of gait 06/05/2016  . Family history of breast cancer in female 02/03/2016  . Family history of ovarian cancer 02/03/2016  . Peripheral neuropathy 12/16/2015  . Breast cancer of upper-outer quadrant of left female breast ([place]) 12/12/2015  . Acute medial meniscal tear 04/12/2015  . Fibromyalgia 02/02/2015  . Tachycardia   . Diastolic  dysfunction   . Essential hypertension, benign 03/31/2014  . Other specified cardiac dysrhythmias(427.89) 03/31/2014   Ruben Im, PT 05/22/18 12:02 PM Phone: [phone] Fax: [phone]  Alvera Singh 05/22/2018, 12:01 PM  [facility] [address] [place], [place], [address] Phone: [phone]   Fax:  [phone]  Name: Audrey [name] MRN: [id number] Date of Birth: [birth year]

## 2018-05-27 ENCOUNTER — Ambulatory Visit: Payer: PPO | Attending: Neurosurgery | Admitting: Physical Therapy

## 2018-05-27 ENCOUNTER — Encounter: Payer: Self-pay | Admitting: Physical Therapy

## 2018-05-27 DIAGNOSIS — R262 Difficulty in walking, not elsewhere classified: Secondary | ICD-10-CM

## 2018-05-27 DIAGNOSIS — G8929 Other chronic pain: Secondary | ICD-10-CM | POA: Insufficient documentation

## 2018-05-27 DIAGNOSIS — M545 Low back pain: Secondary | ICD-10-CM | POA: Insufficient documentation

## 2018-05-27 DIAGNOSIS — M6281 Muscle weakness (generalized): Secondary | ICD-10-CM | POA: Insufficient documentation

## 2018-05-27 NOTE — Therapy (Signed)
[facility] [address] [place], [place], [address] Phone: [phone]   Fax:  [phone]  Physical Therapy Treatment  Patient Details  Name: DEATRA MCMAHEN MRN: [id number] Date of Birth: 21-May-1941 Referring Provider: Dr. Sherwood Gambler    Encounter Date: 05/27/2018  PT End of Session - 05/27/18 1411    Visit Number  7    Date for PT Re-Evaluation  06/10/18    Authorization Type  UMR    PT Start Time  1400    PT Stop Time  1445    PT Time Calculation (min)  45 min    Activity Tolerance  Patient tolerated treatment well       Past Medical History:  Diagnosis Date  . Allergy   . Arthritis   . Bladder infection    hx of last one approx 5 years ago   . Breast cancer ([place]) 10/25/15   left breast  ADH   . Cancer ([place])    breast  . Complication of anesthesia    difficulty awakening   . Diastolic dysfunction   . GERD (gastroesophageal reflux disease)   . 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 Hypertension   . Obesity   . PONV (postoperative nausea and vomiting)   . Reflux    buring and dysphagia sx resp to PPI; egd 3/06 neg for Barretts  . 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 Yeast infection    hx of     Past Surgical History:  Procedure Laterality Date  . AXILLARY LYMPH NODE BIOPSY Left 12/27/2015   Procedure: LEFT AXILLARY SENTINEL LYMPH NODE BIOPSY;  Surgeon: [name], MD;  Location: [facility];  Service: General;  Laterality: Left;  . BELPHAROPTOSIS REPAIR Bilateral   . BREAST LUMPECTOMY WITH RADIOACTIVE SEED LOCALIZATION Left 12/01/2015   Procedure: LEFT BREAST [place] RADIOACTIVE SEED LOCALIZATION;  Surgeon: [name], MD;  Location: [facility];  Service: General;  Laterality: Left;  . COLONOSCOPY  3/06   NEG  . cortisone injections     right arm; back  . EYE SURGERY     cosmetic; eyelid surgery bilat   .  FINGER SURGERY     left index finger/ cyst 02/2007  . KNEE ARTHROSCOPY Right 04/13/2015   Procedure: ARTHROSCOPY RIGHT KNEE WITH MEDIAL MENISCAL DEBRIDEMENT AND CHONDROPLASTY;  Surgeon: Gaynelle Arabian, MD;  [facility];  Service: Orthopedics;  Laterality: Right;  . left rotator cuff repair     12/2006  . LUMBAR LAMINECTOMY    . TONSILLECTOMY      There were no vitals filed for this visit.  Subjective Assessment - 05/27/18 1405    Subjective  Brought in her new TENS unit. "I have some questions about it."   Feeling tired from a rough few days.  Relief from last time only lasted 1 hour.   I'm not sure DN is helping as much.  I'm doing Elliptical at the gym.  Grandchildren coming to visit next week.      Pertinent History  Breast CA;  OA;  taken 15 years of yoga; peripheral neuropathy ;  osteopenia; right  knee scope;  may have fibromyalgia;   left knee pain;  MD Aug    Currently in Pain?  Yes    Pain Score  3     Pain Location  Back    Pain Type  Chronic pain    Aggravating  Factors   walking, standing                       [facility] Adult PT Treatment/Exercise - 05/27/18 0001      Therapeutic Activites    Therapeutic Activities  Other Therapeutic Activities    Other Therapeutic Activities  set up and use of home TENS for pain control       Neuro Re-ed    Neuro Re-ed Details   transverse abdominus muscle activation; quad and gluteal activation       Lumbar Exercises: Aerobic   Nustep  seat 4 10   min L3      Lumbar Exercises: Machines for Strengthening   Leg Press  seat 5 70# bil 20x; single leg 50# 20x each     Other Lumbar Machine Exercise  seated rows 15# 30x    Other Lumbar Machine Exercise  seated lat bar 20# 30x      Lumbar Exercises: Supine   Bent Knee Raise  10 reps    Other Supine Lumbar Exercises  single leg lowering 10x from bent knee position     Other Supine Lumbar Exercises  small range side to side 5x                PT Short Term Goals -  05/22/18 1158      PT SHORT TERM GOAL #1   Title  The patient will express understanding of basic osteopenia precautions    Status  Achieved      PT SHORT TERM GOAL #2   Title  The patient will report a 25% improvement in back pain with walking 1/4 mile    Time  3    Period  Weeks    Status  On-going      PT SHORT TERM GOAL #3   Title  The patient will demonstrate good technique with getting in/out of bed that reduces strain to her low back    Status  Achieved        PT Long Term Goals - 04/29/18 2157      PT LONG TERM GOAL #1   Title  The patient will be independent in safe self progression of HEP     Time  6    Period  Weeks    Status  New    Target Date  06/10/18      PT LONG TERM GOAL #2   Title  The patient will report a 50% improvement in back pain with walking 1/2 mile    Time  6    Period  Weeks    Status  New      PT LONG TERM GOAL #3   Title  Right hip abduction strength and knee extension strength to 4 to 4+/5 needed to ascend steps with right leg    Time  6    Period  Weeks    Status  New      PT LONG TERM GOAL #4   Title  FOTO functional outcome score improved from 56% limitation to 46% limitation indicating improved function with less pain    Time  6    Period  Weeks    Status  New            Plan - 05/27/18 1439    Clinical Impression Statement  The patient demonstrates excellent compliance with a HEP and gym program.  We did discuss eliminating sit up/toe touches secondary to concerns  with her osteopenia and encouraged alternative abdominal bracing with LE movements.  She is able to perform supine core strengthening as well as use several strengthening machines for trunk and LE strengthening without aggravating symptoms; however she reports the pain is exacerbated with standing and walking.  The patient will continue with her HEP, yoga and use her TENs for the next 10 days to assess her ability to self manage.  Will follow up at that time for  reassessment of progress toward goals.  Therapist closely monitoring response with all interventions.      Clinical Impairments Affecting Rehab Potential  fibromyalgia;  history of CA no [place]    PT Frequency  2x / week    PT Duration  6 weeks    PT Treatment/Interventions  ADLs/Self Care Home Management;Cryotherapy;Electrical Stimulation;Moist Heat;Therapeutic activities;Therapeutic exercise;Patient/family education;Neuromuscular re-education;Manual techniques;Dry needling;Taping    PT Next Visit Plan  ERO next visit;  FOTO;  right LE strengthening;  core strengthening; check walking distance/tolerance    PT Home Exercise Plan  patient is satisfied with her yoga routine for stretching so will focus on HEP for core and right LE strengthening       Patient will benefit from skilled therapeutic intervention in order to improve the following deficits and impairments:  Pain, Postural dysfunction, Increased fascial restricitons, Increased muscle spasms, Decreased activity tolerance, Decreased strength, Difficulty walking, Decreased knowledge of precautions, Impaired perceived functional ability  Visit Diagnosis: Chronic bilateral low back pain, with sciatica presence unspecified  Muscle weakness (generalized)  Difficulty in walking, not elsewhere classified     Problem List Patient Active Problem List   Diagnosis Date Noted  . Hereditary and idiopathic peripheral neuropathy 06/05/2016  . Abnormality of gait 06/05/2016  . Family history of breast cancer in female 02/03/2016  . Family history of ovarian cancer 02/03/2016  . Peripheral neuropathy 12/16/2015  . Breast cancer of upper-outer quadrant of left female breast ([place]) 12/12/2015  . Acute medial meniscal tear 04/12/2015  . Fibromyalgia 02/02/2015  . Tachycardia   . Diastolic dysfunction   . Essential hypertension, benign 03/31/2014  . Other specified cardiac dysrhythmias(427.89) 03/31/2014   Ruben Im, PT 05/27/18 5:29  PM Phone: [phone] Fax: [phone]  Alvera Singh 05/27/2018, 5:29 PM  [facility] [address], [place], [place], [address] Phone: [phone]   Fax:  [phone]  Name: IGNACIA GENTZLER MRN: [id number] Date of Birth: 10-Nov-1941

## 2018-06-02 ENCOUNTER — Ambulatory Visit: Payer: PPO | Admitting: Physical Therapy

## 2018-06-05 ENCOUNTER — Encounter: Payer: PPO | Admitting: Physical Therapy

## 2018-06-10 ENCOUNTER — Ambulatory Visit: Payer: PPO | Admitting: Physical Therapy

## 2018-06-10 DIAGNOSIS — R300 Dysuria: Secondary | ICD-10-CM | POA: Diagnosis not present

## 2018-06-10 DIAGNOSIS — E039 Hypothyroidism, unspecified: Secondary | ICD-10-CM | POA: Diagnosis not present

## 2018-06-10 DIAGNOSIS — R509 Fever, unspecified: Secondary | ICD-10-CM | POA: Diagnosis not present

## 2018-06-10 DIAGNOSIS — I10 Essential (primary) hypertension: Secondary | ICD-10-CM | POA: Diagnosis not present

## 2018-06-10 DIAGNOSIS — E7800 Pure hypercholesterolemia, unspecified: Secondary | ICD-10-CM | POA: Diagnosis not present

## 2018-06-10 DIAGNOSIS — N390 Urinary tract infection, site not specified: Secondary | ICD-10-CM | POA: Diagnosis not present

## 2018-06-17 ENCOUNTER — Encounter: Payer: Self-pay | Admitting: Physical Therapy

## 2018-06-17 ENCOUNTER — Ambulatory Visit: Payer: PPO | Admitting: Physical Therapy

## 2018-06-17 DIAGNOSIS — R262 Difficulty in walking, not elsewhere classified: Secondary | ICD-10-CM

## 2018-06-17 DIAGNOSIS — G8929 Other chronic pain: Secondary | ICD-10-CM

## 2018-06-17 DIAGNOSIS — M545 Low back pain: Secondary | ICD-10-CM | POA: Diagnosis not present

## 2018-06-17 DIAGNOSIS — M6281 Muscle weakness (generalized): Secondary | ICD-10-CM

## 2018-06-17 NOTE — Therapy (Addendum)
[facility] [address], [place], [place], [address] Phone: [phone]   Fax:  [phone]  Physical Therapy Treatment/Recertification/ Discharge Summary   Patient Details  Name: Audrey [name] MRN: [id number] Date of Birth: 11/21/1941 Referring Provider: Dr. Sherwood Gambler    Encounter Date: 06/17/2018  PT End of Session - 06/17/18 1657    Visit Number  8    Date for PT Re-Evaluation  06/17/18    Authorization Type  [facility]    PT Start Time  1603    PT Stop Time  1650    PT Time Calculation (min)  47 min    Activity Tolerance  Patient tolerated treatment well       Past Medical History:  Diagnosis Date  . Allergy   . Arthritis   . Bladder infection    hx of last one approx 5 years ago   . Breast cancer ([place]) 10/25/15   left breast  ADH   . Cancer ([place])    breast  . Complication of anesthesia    difficulty awakening   . Diastolic dysfunction   . GERD (gastroesophageal reflux disease)   . 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 Hypertension   . Obesity   . PONV (postoperative nausea and vomiting)   . Reflux    buring and dysphagia sx resp to PPI; egd 3/06 neg for Barretts  . 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 Yeast infection    hx of     Past Surgical History:  Procedure Laterality Date  . AXILLARY LYMPH NODE BIOPSY Left 12/27/2015   Procedure: LEFT AXILLARY SENTINEL LYMPH NODE BIOPSY;  Surgeon: [name], MD;  Location: [place];  Service: General;  Laterality: Left;  . BELPHAROPTOSIS REPAIR Bilateral   . BREAST LUMPECTOMY WITH RADIOACTIVE SEED LOCALIZATION Left 12/01/2015   Procedure: LEFT BREAST Walker RADIOACTIVE SEED LOCALIZATION;  Surgeon: [name], MD;  Location: [place];  Service: General;  Laterality: Left;  . COLONOSCOPY  3/06   NEG  . cortisone injections     right arm; back  . EYE SURGERY      cosmetic; eyelid surgery bilat   . FINGER SURGERY     left index finger/ cyst 02/2007  . KNEE ARTHROSCOPY Right 04/13/2015   Procedure: ARTHROSCOPY RIGHT KNEE WITH MEDIAL MENISCAL DEBRIDEMENT AND CHONDROPLASTY;  Surgeon: Gaynelle Arabian, MD;  [facility];  Service: Orthopedics;  Laterality: Right;  . left rotator cuff repair     12/2006  . LUMBAR LAMINECTOMY    . TONSILLECTOMY      There were no vitals filed for this visit.  Subjective Assessment - 06/17/18 1559    Subjective  I've been so sick with a UTI.  Ok with sitting and standing.  Walking still painful.  I've tried to keep up with yoga and core ex.  I'm wearing TENS when I go into town.  Right thigh giving me trouble.  Had 1 episode of left posterior thigh.  I can't function normally b/c I can't walk.  I can't ascend stairs leading with right leg.      Pertinent History  Breast CA;  OA;  taken 15 years of yoga; peripheral neuropathy ;  osteopenia; right  knee scope;  may have fibromyalgia;   left knee pain;  MD Aug    How long can you walk comfortably?  1000 feet    Patient Stated  Goals  I want to be able to go walking with my husband    Currently in Pain?  Yes    Pain Score  0-No pain walking 7-8/10    Pain Location  Back    Pain Orientation  Lower    Pain Type  Chronic pain    Aggravating Factors   walking         [facility] PT Assessment - 06/17/18 0001      Observation/Other Assessments   Focus on Therapeutic Outcomes (FOTO)   51%      AROM   Lumbar Flexion  85    Lumbar Extension  20    Lumbar - Right Side Bend  35    Lumbar - Left Side Bend  40      Strength   Right Hip Flexion  4+/5    Right Hip Extension  4+/5    Right Hip ABduction  4/5    Left Hip Flexion  4+/5    Left Hip Extension  4+/5    Left Hip ABduction  4/5    Right Knee Flexion  5/5    Right Knee Extension  4/5    Left Knee Extension  4+/5    Lumbar Flexion  4+/5 decreased activation of transverse abdominus    Lumbar Extension  4+/5                    [facility] Adult PT Treatment/Exercise - 06/17/18 0001      Lumbar Exercises: Aerobic   Nustep  seat 4 10   min L3      Lumbar Exercises: Supine   Other Supine Lumbar Exercises  discussion of HEP progression for core and LE strengthening      Manual Therapy   Soft tissue mobilization  right gluteals and piriformis, bil lumbar paraspinals        Trigger Point Dry Needling - 06/17/18 1656    Consent Given?  Yes    Muscles Treated Lower Body  -- bil lumbar multifidi     Gluteus Maximus Response  Twitch response elicited;Palpable increased muscle length    Gluteus Minimus Response  Twitch response elicited;Palpable increased muscle length    Piriformis Response  Twitch response elicited;Palpable increased muscle length             PT Short Term Goals - 06/17/18 1630      PT SHORT TERM GOAL #1   Title  The patient will express understanding of basic osteopenia precautions    Status  Achieved      PT SHORT TERM GOAL #2   Title  The patient will report a 25% improvement in back pain with walking 1/4 mile    Status  Not Met      PT SHORT TERM GOAL #3   Title  The patient will demonstrate good technique with getting in/out of bed that reduces strain to her low back    Status  Achieved        PT Long Term Goals - 06/17/18 1614      PT LONG TERM GOAL #1   Title  The patient will be independent in safe self progression of HEP     Status  Achieved      PT LONG TERM GOAL #2   Title  The patient will report a 50% improvement in back pain with walking 1/2 mile    Status  Not Met      PT LONG TERM GOAL #3  Title  Right hip abduction strength and knee extension strength to 4 to 4+/5 needed to ascend steps with right leg    Status  Partially Met      PT LONG TERM GOAL #4   Title  FOTO functional outcome score improved from 56% limitation to 46% limitation indicating improved function with less pain    Status  Not Met            Plan -  06/17/18 1657    Clinical Impression Statement  The patient continues to have back pain, right buttock and thigh symptoms with walking and standing.  She is very limited in gait distance to < 1000 feet.  She also has right LE motor weakness and is unable to ascend stairs with right foot leading.  She has been very compliant with her PT HEP which has included core strengthening and LE strengthening as well as continued compliance with yoga on a regular basis.  She has also acquired a home TENs unit for pain control.  She reports some temporary benefit from dry needling.  Despite all these interventions and her good home compliance, the pain continues to be debilitating with weight bearing and no further progress with rehab goals has been made.    Only partial LTGs met.   Recommend discharge from PT to follow up with MD.        Initial certification rerouted for signature.     Patient will benefit from skilled therapeutic intervention in order to improve the following deficits and impairments:     Visit Diagnosis: Chronic bilateral low back pain, with sciatica presence unspecified - Plan: PT plan of care cert/re-cert  Muscle weakness (generalized) - Plan: PT plan of care cert/re-cert  Difficulty in walking, not elsewhere classified - Plan: PT plan of care cert/re-cert  PHYSICAL THERAPY DISCHARGE SUMMARY  Visits from Start of Care: 8  Current functional level related to goals / functional outcomes: See clinical impressions above   Remaining deficits: As above   Education / Equipment: Comprehensive HEP Plan: Patient agrees to discharge.  Patient goals were partially met. Patient is being discharged due to lack of progress.  ?????          Problem List Patient Active Problem List   Diagnosis Date Noted  . Hereditary and idiopathic peripheral neuropathy 06/05/2016  . Abnormality of gait 06/05/2016  . Family history of breast cancer in female 02/03/2016  . Family history of  ovarian cancer 02/03/2016  . Peripheral neuropathy 12/16/2015  . Breast cancer of upper-outer quadrant of left female breast ([place]) 12/12/2015  . Acute medial meniscal tear 04/12/2015  . Fibromyalgia 02/02/2015  . Tachycardia   . Diastolic dysfunction   . Essential hypertension, benign 03/31/2014  . Other specified cardiac dysrhythmias(427.89) 03/31/2014   Ruben Im, PT 06/17/18 5:09 PM Phone: [phone] Fax: [phone]  Alvera Singh 06/17/2018, 5:08 PM   [facility] [address], [place], [place], [address] Phone: [phone]   Fax:  [phone]  Name: Audrey [name] MRN: [id number] Date of Birth: 03/15/1941

## 2018-06-18 DIAGNOSIS — R7982 Elevated C-reactive protein (CRP): Secondary | ICD-10-CM | POA: Diagnosis not present

## 2018-06-18 DIAGNOSIS — B373 Candidiasis of vulva and vagina: Secondary | ICD-10-CM | POA: Diagnosis not present

## 2018-06-18 DIAGNOSIS — E039 Hypothyroidism, unspecified: Secondary | ICD-10-CM | POA: Diagnosis not present

## 2018-06-18 DIAGNOSIS — I10 Essential (primary) hypertension: Secondary | ICD-10-CM | POA: Diagnosis not present

## 2018-06-18 DIAGNOSIS — E7800 Pure hypercholesterolemia, unspecified: Secondary | ICD-10-CM | POA: Diagnosis not present

## 2018-06-23 DIAGNOSIS — R42 Dizziness and giddiness: Secondary | ICD-10-CM | POA: Diagnosis not present

## 2018-06-23 DIAGNOSIS — E039 Hypothyroidism, unspecified: Secondary | ICD-10-CM | POA: Diagnosis not present

## 2018-06-23 DIAGNOSIS — Z0000 Encounter for general adult medical examination without abnormal findings: Secondary | ICD-10-CM | POA: Diagnosis not present

## 2018-06-23 DIAGNOSIS — K7689 Other specified diseases of liver: Secondary | ICD-10-CM | POA: Diagnosis not present

## 2018-06-23 DIAGNOSIS — I10 Essential (primary) hypertension: Secondary | ICD-10-CM | POA: Diagnosis not present

## 2018-06-23 DIAGNOSIS — E7800 Pure hypercholesterolemia, unspecified: Secondary | ICD-10-CM | POA: Diagnosis not present

## 2018-06-26 DIAGNOSIS — K838 Other specified diseases of biliary tract: Secondary | ICD-10-CM | POA: Diagnosis not present

## 2018-06-26 DIAGNOSIS — R945 Abnormal results of liver function studies: Secondary | ICD-10-CM | POA: Diagnosis not present

## 2018-07-04 DIAGNOSIS — M48062 Spinal stenosis, lumbar region with neurogenic claudication: Secondary | ICD-10-CM | POA: Diagnosis not present

## 2018-07-04 DIAGNOSIS — M4316 Spondylolisthesis, lumbar region: Secondary | ICD-10-CM | POA: Diagnosis not present

## 2018-07-04 DIAGNOSIS — M4317 Spondylolisthesis, lumbosacral region: Secondary | ICD-10-CM | POA: Diagnosis not present

## 2018-07-04 DIAGNOSIS — M5136 Other intervertebral disc degeneration, lumbar region: Secondary | ICD-10-CM | POA: Diagnosis not present

## 2018-07-08 ENCOUNTER — Other Ambulatory Visit: Payer: Self-pay | Admitting: Neurosurgery

## 2018-07-10 DIAGNOSIS — M5137 Other intervertebral disc degeneration, lumbosacral region: Secondary | ICD-10-CM | POA: Diagnosis not present

## 2018-07-10 DIAGNOSIS — M4317 Spondylolisthesis, lumbosacral region: Secondary | ICD-10-CM | POA: Diagnosis not present

## 2018-07-10 DIAGNOSIS — M4316 Spondylolisthesis, lumbar region: Secondary | ICD-10-CM | POA: Diagnosis not present

## 2018-07-10 DIAGNOSIS — M48062 Spinal stenosis, lumbar region with neurogenic claudication: Secondary | ICD-10-CM | POA: Diagnosis not present

## 2018-07-23 NOTE — Pre-Procedure Instructions (Signed)
Audrey [name]  07/23/2018      [facility] - [place], [place] - [address] [place] [address] Phone: [phone] Fax: [phone]  [facility] [place], [place] [address] Phone: [phone] Fax: [phone]  [id number] [place], [place] [address] [place] [address] Phone: [phone] Fax: [phone]    Your procedure is scheduled on  Thursday 07/31/18  Report to [facility] Admitting at 530 A.M.  Call this number if you have problems the morning of surgery:  [phone]   Remember:  Do not eat or drink after midnight.      Take these medicines the morning of surgery with A SIP OF WATER -  TUMS IF NEEDED,  GABAPENTIN, LEVOTHYROXINE,  NEBIVOLOL (BYSTOLIC), EYE DROPS  7 days prior to surgery STOP taking any Aspirin(unless otherwise instructed by your surgeon), Aleve, Naproxen, Ibuprofen, Motrin, Advil, Goody's, BC's, all herbal medications, fish oil, and all vitamins    Do not wear jewelry, make-up or nail polish.  Do not wear lotions, powders, or perfumes, or deodorant.  Do not shave 48 hours prior to surgery.  Men may shave face and neck.  Do not bring valuables to the hospital.  [facility] is not responsible for any belongings or valuables.  Contacts, dentures or bridgework may not be worn into surgery.  Leave your suitcase in the car.  After surgery it may be brought to your room.  For patients admitted to the hospital, discharge time will be determined by your treatment team.  Patients discharged the day of surgery will not be allowed to drive home.   Name and phone number of your driver:   Special instructions:  [facility] - Preparing for Surgery  Before surgery, you can play an important role.  Because skin is not  sterile, your skin needs to be as free of germs as possible.  You can reduce the number of germs on you skin by washing with CHG (chlorahexidine gluconate) soap before surgery.  CHG is an antiseptic cleaner which kills germs and bonds with the skin to continue killing germs even after washing.  Oral Hygiene is also important in reducing the risk of infection.  Remember to brush your teeth with your regular toothpaste the morning of surgery.  Please DO NOT use if you have an allergy to CHG or antibacterial soaps.  If your skin becomes reddened/irritated stop using the CHG and inform your nurse when you arrive at Short Stay.  Do not shave (including legs and underarms) for at least 48 hours prior to the first CHG shower.  You may shave your face.  Please follow these instructions carefully:   1.  Shower with CHG Soap the night before surgery and the morning of Surgery.  2.  If you choose to wash your hair, wash your hair first as usual with your normal shampoo.  3.  After you shampoo, rinse your hair and body thoroughly to remove the shampoo. 4.  Use CHG as you would any other liquid soap.  You can apply chg directly to the skin and wash gently with a      scrungie or washcloth.           5.  Apply the CHG Soap to your body ONLY FROM THE NECK DOWN.  Do not use on open wounds or open sores. Avoid contact with your eyes, ears, mouth and genitals (private parts).  Wash genitals (private parts) with your normal soap.  6.  Wash thoroughly, paying special attention to the area where your surgery will be performed.  7.  Thoroughly rinse your body with warm water from the neck down.  8.  DO NOT shower/wash with your normal soap after using and rinsing off the CHG Soap.  9.  Pat yourself dry with a clean towel.            10.  Wear clean pajamas.            11.  Place clean sheets on your bed the night of your first shower and do not sleep with pets.  Day of Surgery  Do not apply any lotions/deoderants  the morning of surgery.   Please wear clean clothes to the hospital/surgery center. Remember to brush your teeth with toothpaste.     Please read over the following fact sheets that you were given. Pain Booklet, MRSA Information and Surgical Site Infection Prevention

## 2018-07-24 ENCOUNTER — Encounter (HOSPITAL_COMMUNITY)
Admission: RE | Admit: 2018-07-24 | Discharge: 2018-07-24 | Disposition: A | Discharge status: Home / Self Care | Payer: PPO | Source: Ambulatory Visit | Attending: Neurosurgery | Admitting: Neurosurgery

## 2018-07-24 ENCOUNTER — Encounter (HOSPITAL_COMMUNITY): Payer: Self-pay

## 2018-07-24 ENCOUNTER — Other Ambulatory Visit: Payer: Self-pay

## 2018-07-24 DIAGNOSIS — Z01818 Encounter for other preprocedural examination: Secondary | ICD-10-CM | POA: Insufficient documentation

## 2018-07-24 DIAGNOSIS — G8929 Other chronic pain: Secondary | ICD-10-CM | POA: Insufficient documentation

## 2018-07-24 DIAGNOSIS — M545 Low back pain: Secondary | ICD-10-CM | POA: Insufficient documentation

## 2018-07-24 HISTORY — DX: Hypothyroidism, unspecified: E03.9

## 2018-07-24 HISTORY — DX: Dyspnea, unspecified: R06.00

## 2018-07-24 LAB — CBC
HCT: 43.3 % (ref 36.0–46.0)
Hemoglobin: 14.9 g/dL (ref 12.0–15.0)
MCH: 30.5 pg (ref 26.0–34.0)
MCHC: 34.4 g/dL (ref 30.0–36.0)
MCV: 88.5 fL (ref 78.0–100.0)
PLATELETS: 365 10*3/uL (ref 150–400)
RBC: 4.89 MIL/uL (ref 3.87–5.11)
RDW: 13.5 % (ref 11.5–15.5)
WBC: 7 10*3/uL (ref 4.0–10.5)

## 2018-07-24 LAB — ABO/RH: ABO/RH(D): O POS

## 2018-07-24 LAB — TYPE AND SCREEN
ABO/RH(D): O POS
Antibody Screen: NEGATIVE

## 2018-07-24 LAB — BASIC METABOLIC PANEL
Anion gap: 9 (ref 5–15)
BUN: 11 mg/dL (ref 8–23)
CHLORIDE: 101 mmol/L (ref 98–111)
CO2: 30 mmol/L (ref 22–32)
Calcium: 9.8 mg/dL (ref 8.9–10.3)
Creatinine, Ser: 0.89 mg/dL (ref 0.44–1.00)
GFR calc Af Amer: >60 mL/min (ref >60)
GFR calc non Af Amer: >60 mL/min (ref >60)
Glucose, Bld: 98 mg/dL (ref 70–99)
Potassium: 3.5 mmol/L (ref 3.5–5.1)
Sodium: 140 mmol/L (ref 135–145)

## 2018-07-24 LAB — SURGICAL PCR SCREEN
MRSA, PCR: NEGATIVE
Staphylococcus aureus: NEGATIVE

## 2018-07-24 NOTE — Progress Notes (Addendum)
Mrs Searight denies chest pain or shortness of breath at rest.  Patient does have shortness of breath at times due to pain when walking..  Patient does Yoga on a regular basis and well as works out at a gym.  Patient had a history of inappropiate tachycardia- a 1 time event per patient.  Patient was seen by Dr Ashok Norris in 2015 and 2016.  At 2016 visit patient had stopped Bystolic due to fatigue, patient has since restarted medication.  Patient denies any palpitations, lightheadedness and now feels well taking Bystiolic. PCP is Dr [name].

## 2018-07-24 NOTE — Pre-Procedure Instructions (Signed)
Audrey [name]  07/24/2018      Your procedure is scheduled on  Thursday 07/31/18  Report to [facility] Admitting at 5:30 A.M.                Your surgery or procedure is scheduled for 11:30 AM   Call this number if you have problems the morning of surgery:  [phone]  This is the pre op desk.                  Remember:  Do not eat or drink after midnight Wednesday, September 4.      Take these medicines the morning of surgery with A SIP OF WATER -    GABAPENTIN, LEVOTHYROXINE,  NEBIVOLOL (BYSTOLIC), EYE DROPS  TUMS IF NEEDED  7 days prior to surgery STOP taking any Aspirin(unless otherwise instructed by your surgeon), Aleve, Naproxen, Ibuprofen, Motrin, Advil, Goody's, BC's, all herbal medications, fish oil, and all vitamins    Special instructions:  [place] - Preparing for Surgery  Before surgery, you can play an important role.  Because skin is not sterile, your skin needs to be as free of germs as possible.  You can reduce the number of germs on you skin by washing with CHG (chlorahexidine gluconate) soap before surgery.  CHG is an antiseptic cleaner which kills germs and bonds with the skin to continue killing germs even after washing.  Oral Hygiene is also important in reducing the risk of infection.  Remember to brush your teeth with your regular toothpaste the morning of surgery.  Please DO NOT use if you have an allergy to CHG or antibacterial soaps.  If your skin becomes reddened/irritated stop using the CHG and inform your nurse when you arrive at Short Stay.  Do not shave (including legs and underarms) for at least 48 hours prior to the first CHG shower.  You may shave your face.  Please follow these instructions carefully:   1.  Shower with CHG Soap the night before surgery and the morning of Surgery.  2.  If you choose to wash your hair, wash your hair first as usual with your normal shampoo. 3.  After you shampoo, rinse your hair and body  thoroughly to remove the shampoo.  Wash your face and private area with the soap you use at home, then rinse.   4.  Use CHG as you would any other liquid soap.  You can apply chg directly to the skin and wash gently with ascrungie or washcloth.            5.  Apply the CHG Soap to your body ONLY FROM THE NECK DOWN.   Do not use on open wounds or open sores. Avoid contact with your eyes, ears, mouth and genitals (private parts).    6.  Wash thoroughly, paying special attention to the area where your surgery will be performed.  7.  Thoroughly rinse your body with warm water from the neck down.  8.  DO NOT shower/wash with your normal soap after using and rinsing off the CHG Soap.  9.  Pat yourself dry with a clean towel.            10.  Wear clean pajamas.            11.  Place clean sheets on your bed the night of your first shower and do not sleep with pets  .Day of Surgery- shower as above Do  not apply any lotions/deoderants, colognes or powders the morning of surgery.   Please wear clean clothes to the hospital/surgery center. Remember to brush your teeth with toothpaste.  Do not wear jewelry, make-up or nail polish.  Do not shave 48 hours prior to surgery.  Men may shave face and neck.  Do not bring valuables to the hospital.  [facility] is not responsible for any belongings or valuables.  Contacts, dentures or bridgework may not be worn into surgery.  Leave your suitcase in the car.  After surgery it may be brought to your room.  For patients admitted to the hospital, discharge time will be determined by your treatment team.  Patients discharged the day of surgery will not be allowed to drive home.   Please read over the following fact sheets that you were given. Pain Booklet, MRSA Information and Surgical Site Infection Prevention

## 2018-07-25 NOTE — Progress Notes (Signed)
Anesthesia Chart Review:  Case:  [id number] Date/Time:  07/31/18 0715   Procedure:  LUMBAR 3- LUMBAR 4, LUMBAR 5-SACRAL 1 DECOMPRESSION,POSTERIOR LUMBAR INTERBODY FUSION, POSTERIOR LATERAL ARTHRODESIS (N/A Back) - LUMBAR 3- LUMBAR 4, LUMBAR 5-SACRAL 1 DECOMPRESSION,POSTERIOR LUMBAR INTERBODY FUSION, POSTERIOR LATERAL ARTHRODESIS   Anesthesia type:  General   Pre-op diagnosis:  LUMBAR STENOSIS WITH NEUROGENIC CLAUDICATION   Location:  [place] OR ROOM 77 / [facility] OR   Surgeon:  Jovita Gamma, MD      DISCUSSION: 77 yo female never smoker for above procedure. Pertinent hx includes PONV, Hx inappropriate sinus tach (per pt 1 episode years ago, controlled on Bblocker), HTN, Peripheral neuropathy, GERD, "Difficulty waking up from anesthesia", Hypothyroid, Hx Breast CA s/p left lumpectomy.  Pt previously saw cardiology for eval of episode of tachycardia. Last visit 03/22/2015. At that time she had d/c'd Bblocker due to fatigue and had no recurrence of symptoms. In 2013 she had a normal nuclear stress and an Echo with 74% EF. She is now back on Bystolic. Denies any recent symptoms of CP and SOB. Recently completed six weeks of physical therapy and reports regular exercise at a gym.  Anticipate she can proceed with surgery as planned barring acute status change.  VS: BP (!) 147/75   Pulse 74   Temp 36.5 C   Resp 20   Ht 4\' 11"  (1.499 m)   Wt 74.9 kg   SpO2 99%   BMI 33.35 kg/m   PROVIDERS: [name], MD is PCP  Fransico Him, MD is Cardiologist  LABS: Labs reviewed: Acceptable for surgery. (all labs ordered are listed, but only abnormal results are displayed)  Labs Reviewed  SURGICAL PCR SCREEN  BASIC METABOLIC PANEL  CBC  TYPE AND SCREEN  ABO/RH     IMAGES: N/A   EKG: 07/24/28: NSR  CV: 04/16/12 Nuclear stress test:  Impression: Post stress myocardial perfusion images show a normal pattern of perfusion in all areas. The post stress left ventricle is normal in size. There is no  scintigraphic evidence of inducible myocardial ischemia. No regional wall motion abnormalities. Post stress EF 81%. Exercise capacity 7.0 METS. Exercise capacity is normal. Nondiagnostic ECG. Normal myocardial perfusion study.  04/16/12 Echo:  Conclusions: 1. Normal 2-D, m-mode, color Doppler echo. 2. Left ventricular ejection fraction estimated at 74.2%. 3. Analysis of mitral valve inflow, pulmonary vein Doppler and tissue Doppler suggests grade 1 diastolic dysfunction without elevated left atrial pressure. 4. Trivial tricuspid regurgitation. 5. Trace mitral regurgitation.   Past Medical History:  Diagnosis Date  . Allergy   . Arthritis   . Bladder infection    hx of last one approx 5 years ago   . 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 Cancer ([place])    breast  . Complication of anesthesia    difficulty awakening   . Diastolic dysfunction   . Dyspnea    due to pain  . GERD (gastroesophageal reflux disease)    Tums if needed  . 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 Hypertension   . Hypothyroidism   . Obesity   . PONV (postoperative nausea and vomiting)   . Reflux    buring and dysphagia sx resp to PPI; egd 3/06 neg for Barretts  . 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 Yeast infection    hx of     Past Surgical History:  Procedure Laterality Date  .  AXILLARY LYMPH NODE BIOPSY Left 12/27/2015   Procedure: LEFT AXILLARY SENTINEL LYMPH NODE BIOPSY;  Surgeon: [name], MD;  Location: [place];  Service: General;  Laterality: Left;  . BELPHAROPTOSIS REPAIR Bilateral   . BREAST LUMPECTOMY WITH RADIOACTIVE SEED LOCALIZATION Left 12/01/2015   Procedure: LEFT BREAST [place] RADIOACTIVE SEED LOCALIZATION;  Surgeon: [name], MD;  Location: [place];  Service: General;  Laterality: Left;  . COLONOSCOPY  3/06   NEG  . cortisone injections     right arm; back  . EYE SURGERY      cosmetic; eyelid surgery bilat   . FINGER SURGERY     left index finger/ cyst 02/2007  . KNEE ARTHROSCOPY Right 04/13/2015   Procedure: ARTHROSCOPY RIGHT KNEE WITH MEDIAL MENISCAL DEBRIDEMENT AND CHONDROPLASTY;  Surgeon: Gaynelle Arabian, MD;  Location[facility];  Service: Orthopedics;  Laterality: Right;  . left rotator cuff repair     12/2006  . LUMBAR LAMINECTOMY    . TONSILLECTOMY      MEDICATIONS: . Calcium Carb-Cholecalciferol (CALCIUM + D3) 600-200 MG-UNIT TABS  . calcium carbonate (TUMS - DOSED IN MG ELEMENTAL CALCIUM) 500 MG chewable tablet  . Cholecalciferol (VITAMIN D3 PO)  . Co-Enzyme Q10 200 MG CAPS  . Cranberry 200 MG CAPS  . Cyanocobalamin (VITAMIN B-12) 5000 MCG SUBL  . gabapentin (NEURONTIN) 300 MG capsule  . Glucos-Chondroit-Hyaluron-MSM (GLUCOSAMINE CHONDROITIN JOINT PO)  . hydrochlorothiazide (MICROZIDE) 12.5 MG capsule  . levothyroxine (SYNTHROID, LEVOTHROID) 25 MCG tablet  . loratadine (CLARITIN) 10 MG tablet  . Multiple Vitamins-Minerals (PRESERVISION AREDS 2) CAPS  . nebivolol (BYSTOLIC) 5 MG tablet  . Propylene Glycol (SYSTANE BALANCE) 0.6 % SOLN  . pyridOXINE (VITAMIN B-6) 100 MG tablet   No current facility-administered medications for this encounter.     Wynonia Musty [facility] Short Stay Center/Anesthesiology Phone [phone] 07/25/2018 9:33 AM

## 2018-07-30 ENCOUNTER — Other Ambulatory Visit: Payer: Self-pay | Admitting: Neurosurgery

## 2018-07-31 ENCOUNTER — Inpatient Hospital Stay (HOSPITAL_COMMUNITY)
Admission: RE | Admit: 2018-07-31 | Discharge: 2018-08-01 | DRG: 455 | Disposition: A | Discharge status: Home / Self Care | Payer: PPO | Attending: Neurosurgery | Admitting: Neurosurgery

## 2018-07-31 ENCOUNTER — Inpatient Hospital Stay (HOSPITAL_COMMUNITY): Payer: PPO

## 2018-07-31 ENCOUNTER — Inpatient Hospital Stay (HOSPITAL_COMMUNITY): Payer: PPO | Admitting: Physician Assistant

## 2018-07-31 ENCOUNTER — Encounter (HOSPITAL_COMMUNITY): Payer: Self-pay | Admitting: *Deleted

## 2018-07-31 ENCOUNTER — Inpatient Hospital Stay (HOSPITAL_COMMUNITY)
Admission: RE | Disposition: A | Discharge status: Home / Self Care | Payer: Self-pay | Source: Home / Self Care | Attending: Neurosurgery

## 2018-07-31 ENCOUNTER — Inpatient Hospital Stay (HOSPITAL_COMMUNITY): Payer: PPO | Admitting: Certified Registered Nurse Anesthetist

## 2018-07-31 DIAGNOSIS — M4327 Fusion of spine, lumbosacral region: Secondary | ICD-10-CM | POA: Diagnosis not present

## 2018-07-31 DIAGNOSIS — M479 Spondylosis, unspecified: Secondary | ICD-10-CM | POA: Diagnosis present

## 2018-07-31 DIAGNOSIS — Z419 Encounter for procedure for purposes other than remedying health state, unspecified: Secondary | ICD-10-CM

## 2018-07-31 DIAGNOSIS — M4316 Spondylolisthesis, lumbar region: Secondary | ICD-10-CM | POA: Diagnosis present

## 2018-07-31 DIAGNOSIS — M47816 Spondylosis without myelopathy or radiculopathy, lumbar region: Secondary | ICD-10-CM | POA: Diagnosis present

## 2018-07-31 DIAGNOSIS — Z79890 Hormone replacement therapy (postmenopausal): Secondary | ICD-10-CM | POA: Diagnosis not present

## 2018-07-31 DIAGNOSIS — R200 Anesthesia of skin: Secondary | ICD-10-CM | POA: Diagnosis not present

## 2018-07-31 DIAGNOSIS — K219 Gastro-esophageal reflux disease without esophagitis: Secondary | ICD-10-CM | POA: Diagnosis present

## 2018-07-31 DIAGNOSIS — G609 Hereditary and idiopathic neuropathy, unspecified: Secondary | ICD-10-CM | POA: Diagnosis present

## 2018-07-31 DIAGNOSIS — M1990 Unspecified osteoarthritis, unspecified site: Secondary | ICD-10-CM | POA: Diagnosis not present

## 2018-07-31 DIAGNOSIS — I10 Essential (primary) hypertension: Secondary | ICD-10-CM | POA: Diagnosis not present

## 2018-07-31 DIAGNOSIS — M797 Fibromyalgia: Secondary | ICD-10-CM | POA: Diagnosis present

## 2018-07-31 DIAGNOSIS — Z881 Allergy status to other antibiotic agents status: Secondary | ICD-10-CM | POA: Diagnosis not present

## 2018-07-31 DIAGNOSIS — M4317 Spondylolisthesis, lumbosacral region: Secondary | ICD-10-CM | POA: Diagnosis not present

## 2018-07-31 DIAGNOSIS — Z79899 Other long term (current) drug therapy: Secondary | ICD-10-CM

## 2018-07-31 DIAGNOSIS — Z91041 Radiographic dye allergy status: Secondary | ICD-10-CM | POA: Diagnosis not present

## 2018-07-31 DIAGNOSIS — M5137 Other intervertebral disc degeneration, lumbosacral region: Secondary | ICD-10-CM | POA: Diagnosis not present

## 2018-07-31 DIAGNOSIS — Z888 Allergy status to other drugs, medicaments and biological substances status: Secondary | ICD-10-CM

## 2018-07-31 DIAGNOSIS — Z981 Arthrodesis status: Secondary | ICD-10-CM | POA: Diagnosis not present

## 2018-07-31 DIAGNOSIS — Z853 Personal history of malignant neoplasm of breast: Secondary | ICD-10-CM | POA: Diagnosis not present

## 2018-07-31 DIAGNOSIS — R29818 Other symptoms and signs involving the nervous system: Secondary | ICD-10-CM | POA: Diagnosis not present

## 2018-07-31 DIAGNOSIS — M48062 Spinal stenosis, lumbar region with neurogenic claudication: Principal | ICD-10-CM | POA: Diagnosis present

## 2018-07-31 DIAGNOSIS — M5136 Other intervertebral disc degeneration, lumbar region: Secondary | ICD-10-CM | POA: Diagnosis present

## 2018-07-31 DIAGNOSIS — M7138 Other bursal cyst, other site: Secondary | ICD-10-CM | POA: Diagnosis not present

## 2018-07-31 DIAGNOSIS — E039 Hypothyroidism, unspecified: Secondary | ICD-10-CM | POA: Diagnosis not present

## 2018-07-31 DIAGNOSIS — Z803 Family history of malignant neoplasm of breast: Secondary | ICD-10-CM

## 2018-07-31 DIAGNOSIS — M545 Low back pain: Secondary | ICD-10-CM | POA: Diagnosis present

## 2018-07-31 DIAGNOSIS — M48061 Spinal stenosis, lumbar region without neurogenic claudication: Secondary | ICD-10-CM | POA: Diagnosis not present

## 2018-07-31 SURGERY — POSTERIOR LUMBAR FUSION 2 LEVEL
Anesthesia: General | Site: Back

## 2018-07-31 MED ORDER — ACETAMINOPHEN 650 MG RE SUPP: 650.0000 mg | RECTAL | Status: DC | PRN

## 2018-07-31 MED ORDER — BUPIVACAINE HCL (PF) 0.5 % IJ SOLN
INTRAMUSCULAR | Status: AC
  Filled 2018-07-31: qty 30

## 2018-07-31 MED ORDER — VITAMIN D 1000 UNITS PO TABS
2000.0000 [IU] | ORAL_TABLET | Freq: Every day | ORAL | Status: DC
  Filled 2018-07-31: qty 2

## 2018-07-31 MED ORDER — THROMBIN 20000 UNITS EX SOLR
CUTANEOUS | Status: DC | PRN
  Administered 2018-07-31: 09:00:00 via TOPICAL

## 2018-07-31 MED ORDER — DEXAMETHASONE SODIUM PHOSPHATE 10 MG/ML IJ SOLN
INTRAMUSCULAR | Status: AC
  Filled 2018-07-31: qty 1

## 2018-07-31 MED ORDER — ROCURONIUM BROMIDE 50 MG/5ML IV SOSY
PREFILLED_SYRINGE | INTRAVENOUS | Status: AC
  Filled 2018-07-31: qty 5

## 2018-07-31 MED ORDER — BISACODYL 10 MG RE SUPP: 10.0000 mg | Freq: Every day | RECTAL | Status: DC | PRN

## 2018-07-31 MED ORDER — PROPYLENE GLYCOL 0.6 % OP SOLN: 1.0000 [drp] | OPHTHALMIC | Status: DC

## 2018-07-31 MED ORDER — GABAPENTIN 300 MG PO CAPS: 300.0000 mg | ORAL_CAPSULE | Freq: Every day | ORAL | Status: DC

## 2018-07-31 MED ORDER — PROMETHAZINE HCL 25 MG/ML IJ SOLN
6.2500 mg | INTRAMUSCULAR | Status: AC | PRN
  Administered 2018-07-31 (×2): 6.25 mg via INTRAVENOUS

## 2018-07-31 MED ORDER — POLYVINYL ALCOHOL 1.4 % OP SOLN
1.0000 [drp] | Freq: Every day | OPHTHALMIC | Status: DC
  Filled 2018-07-31: qty 15

## 2018-07-31 MED ORDER — ALUM & MAG HYDROXIDE-SIMETH 200-200-20 MG/5ML PO SUSP: 30.0000 mL | Freq: Four times a day (QID) | ORAL | Status: DC | PRN

## 2018-07-31 MED ORDER — FENTANYL CITRATE (PF) 100 MCG/2ML IJ SOLN
INTRAMUSCULAR | Status: DC | PRN
  Administered 2018-07-31: 100 ug via INTRAVENOUS
  Administered 2018-07-31 (×3): 50 ug via INTRAVENOUS

## 2018-07-31 MED ORDER — HYDROMORPHONE HCL 1 MG/ML IJ SOLN
INTRAMUSCULAR | Status: AC
  Filled 2018-07-31: qty 1

## 2018-07-31 MED ORDER — GABAPENTIN 300 MG PO CAPS: 600.0000 mg | ORAL_CAPSULE | Freq: Every day | ORAL | Status: DC

## 2018-07-31 MED ORDER — LEVOTHYROXINE SODIUM 25 MCG PO TABS
25.0000 ug | ORAL_TABLET | ORAL | Status: DC
  Filled 2018-07-31: qty 1

## 2018-07-31 MED ORDER — SODIUM CHLORIDE 0.9% FLUSH
3.0000 mL | Freq: Two times a day (BID) | INTRAVENOUS | Status: DC
  Administered 2018-07-31: 3 mL via INTRAVENOUS

## 2018-07-31 MED ORDER — PROPOFOL 10 MG/ML IV BOLUS
INTRAVENOUS | Status: AC
  Filled 2018-07-31: qty 20

## 2018-07-31 MED ORDER — ONDANSETRON HCL 4 MG/2ML IJ SOLN
INTRAMUSCULAR | Status: DC | PRN
  Administered 2018-07-31: 4 mg via INTRAVENOUS

## 2018-07-31 MED ORDER — SUGAMMADEX SODIUM 200 MG/2ML IV SOLN
INTRAVENOUS | Status: DC | PRN
  Administered 2018-07-31: 150 mg via INTRAVENOUS

## 2018-07-31 MED ORDER — HYDROXYZINE HCL 25 MG PO TABS: 50.0000 mg | ORAL_TABLET | ORAL | Status: DC | PRN

## 2018-07-31 MED ORDER — CYCLOBENZAPRINE HCL 5 MG PO TABS: 5.0000 mg | ORAL_TABLET | Freq: Three times a day (TID) | ORAL | Status: DC | PRN

## 2018-07-31 MED ORDER — ONDANSETRON HCL 4 MG/2ML IJ SOLN
INTRAMUSCULAR | Status: AC
  Filled 2018-07-31: qty 2

## 2018-07-31 MED ORDER — MAGNESIUM HYDROXIDE 400 MG/5ML PO SUSP: 30.0000 mL | Freq: Every day | ORAL | Status: DC | PRN

## 2018-07-31 MED ORDER — KCL IN DEXTROSE-NACL 20-5-0.45 MEQ/L-%-% IV SOLN: INTRAVENOUS | Status: DC

## 2018-07-31 MED ORDER — CEFAZOLIN SODIUM-DEXTROSE 2-4 GM/100ML-% IV SOLN
2.0000 g | INTRAVENOUS | Status: AC
  Administered 2018-07-31 (×2): 2 g via INTRAVENOUS
  Filled 2018-07-31: qty 100

## 2018-07-31 MED ORDER — KETOROLAC TROMETHAMINE 30 MG/ML IJ SOLN: 15.0000 mg | Freq: Once | INTRAMUSCULAR | Status: DC

## 2018-07-31 MED ORDER — ROCURONIUM BROMIDE 10 MG/ML (PF) SYRINGE
PREFILLED_SYRINGE | INTRAVENOUS | Status: DC | PRN
  Administered 2018-07-31: 30 mg via INTRAVENOUS
  Administered 2018-07-31: 50 mg via INTRAVENOUS
  Administered 2018-07-31: 20 mg via INTRAVENOUS
  Administered 2018-07-31: 10 mg via INTRAVENOUS
  Administered 2018-07-31 (×2): 20 mg via INTRAVENOUS
  Administered 2018-07-31: 10 mg via INTRAVENOUS

## 2018-07-31 MED ORDER — GABAPENTIN 300 MG PO CAPS: 300.0000 mg | ORAL_CAPSULE | ORAL | Status: DC

## 2018-07-31 MED ORDER — ACETAMINOPHEN 10 MG/ML IV SOLN
INTRAVENOUS | Status: AC
  Filled 2018-07-31: qty 100

## 2018-07-31 MED ORDER — PHENYLEPHRINE 40 MCG/ML (10ML) SYRINGE FOR IV PUSH (FOR BLOOD PRESSURE SUPPORT)
PREFILLED_SYRINGE | INTRAVENOUS | Status: DC | PRN
  Administered 2018-07-31 (×3): 120 ug via INTRAVENOUS
  Administered 2018-07-31: 40 ug via INTRAVENOUS

## 2018-07-31 MED ORDER — SODIUM CHLORIDE 0.9 % IV SOLN
INTRAVENOUS | Status: DC | PRN
  Administered 2018-07-31: 13:00:00 via INTRAVENOUS

## 2018-07-31 MED ORDER — PROMETHAZINE HCL 25 MG/ML IJ SOLN
INTRAMUSCULAR | Status: AC
  Filled 2018-07-31: qty 1

## 2018-07-31 MED ORDER — BUPIVACAINE HCL (PF) 0.5 % IJ SOLN
INTRAMUSCULAR | Status: DC | PRN
  Administered 2018-07-31: 20 mL

## 2018-07-31 MED ORDER — PHENOL 1.4 % MT LIQD: 1.0000 | OROMUCOSAL | Status: DC | PRN

## 2018-07-31 MED ORDER — LIDOCAINE-EPINEPHRINE 1 %-1:100000 IJ SOLN
INTRAMUSCULAR | Status: DC | PRN
  Administered 2018-07-31: 20 mL via INTRADERMAL

## 2018-07-31 MED ORDER — LIDOCAINE-EPINEPHRINE 1 %-1:100000 IJ SOLN
INTRAMUSCULAR | Status: AC
  Filled 2018-07-31: qty 1

## 2018-07-31 MED ORDER — HYDROXYZINE HCL 50 MG/ML IM SOLN: 50.0000 mg | INTRAMUSCULAR | Status: DC | PRN

## 2018-07-31 MED ORDER — MEPERIDINE HCL 50 MG/ML IJ SOLN: 6.2500 mg | INTRAMUSCULAR | Status: DC | PRN

## 2018-07-31 MED ORDER — LACTATED RINGERS IV SOLN: INTRAVENOUS | Status: DC

## 2018-07-31 MED ORDER — FLEET ENEMA 7-19 GM/118ML RE ENEM: 1.0000 | ENEMA | Freq: Once | RECTAL | Status: DC | PRN

## 2018-07-31 MED ORDER — ACETAMINOPHEN 325 MG PO TABS: 650.0000 mg | ORAL_TABLET | ORAL | Status: DC | PRN

## 2018-07-31 MED ORDER — HEMOSTATIC AGENTS (NO CHARGE) OPTIME
TOPICAL | Status: DC | PRN
  Administered 2018-07-31: 1 via TOPICAL

## 2018-07-31 MED ORDER — 0.9 % SODIUM CHLORIDE (POUR BTL) OPTIME
TOPICAL | Status: DC | PRN
  Administered 2018-07-31 (×2): 1000 mL

## 2018-07-31 MED ORDER — CHLORHEXIDINE GLUCONATE CLOTH 2 % EX PADS: 6.0000 | MEDICATED_PAD | Freq: Once | CUTANEOUS | Status: DC

## 2018-07-31 MED ORDER — LACTATED RINGERS IV SOLN
INTRAVENOUS | Status: DC | PRN
  Administered 2018-07-31 (×3): via INTRAVENOUS

## 2018-07-31 MED ORDER — FENTANYL CITRATE (PF) 250 MCG/5ML IJ SOLN
INTRAMUSCULAR | Status: AC
  Filled 2018-07-31: qty 5

## 2018-07-31 MED ORDER — PHENYLEPHRINE 40 MCG/ML (10ML) SYRINGE FOR IV PUSH (FOR BLOOD PRESSURE SUPPORT)
PREFILLED_SYRINGE | INTRAVENOUS | Status: AC
  Filled 2018-07-31: qty 10

## 2018-07-31 MED ORDER — HYDROCODONE-ACETAMINOPHEN 5-325 MG PO TABS
1.0000 | ORAL_TABLET | ORAL | Status: DC | PRN
  Administered 2018-08-01: 1 via ORAL
  Filled 2018-07-31: qty 1

## 2018-07-31 MED ORDER — NEBIVOLOL HCL 5 MG PO TABS
5.0000 mg | ORAL_TABLET | Freq: Every day | ORAL | Status: DC
  Filled 2018-07-31: qty 1

## 2018-07-31 MED ORDER — LIDOCAINE 2% (20 MG/ML) 5 ML SYRINGE
INTRAMUSCULAR | Status: DC | PRN
  Administered 2018-07-31: 50 mg via INTRAVENOUS

## 2018-07-31 MED ORDER — SODIUM CHLORIDE 0.9% FLUSH: 3.0000 mL | INTRAVENOUS | Status: DC | PRN

## 2018-07-31 MED ORDER — ACETAMINOPHEN 10 MG/ML IV SOLN
INTRAVENOUS | Status: DC | PRN
  Administered 2018-07-31 (×2): 1000 mg via INTRAVENOUS

## 2018-07-31 MED ORDER — PROPOFOL 10 MG/ML IV BOLUS
INTRAVENOUS | Status: DC | PRN
  Administered 2018-07-31: 140 mg via INTRAVENOUS

## 2018-07-31 MED ORDER — VITAMIN B-6 100 MG PO TABS
100.0000 mg | ORAL_TABLET | Freq: Every day | ORAL | Status: DC
  Filled 2018-07-31: qty 1

## 2018-07-31 MED ORDER — LIDOCAINE 2% (20 MG/ML) 5 ML SYRINGE
INTRAMUSCULAR | Status: AC
  Filled 2018-07-31: qty 5

## 2018-07-31 MED ORDER — MORPHINE SULFATE (PF) 4 MG/ML IV SOLN: 4.0000 mg | INTRAVENOUS | Status: DC | PRN

## 2018-07-31 MED ORDER — VANCOMYCIN HCL 1000 MG IV SOLR
INTRAVENOUS | Status: AC
  Filled 2018-07-31: qty 1000

## 2018-07-31 MED ORDER — GABAPENTIN 300 MG PO CAPS
600.0000 mg | ORAL_CAPSULE | Freq: Every day | ORAL | Status: DC
  Administered 2018-07-31: 600 mg via ORAL
  Filled 2018-07-31: qty 2

## 2018-07-31 MED ORDER — MENTHOL 3 MG MT LOZG: 1.0000 | LOZENGE | OROMUCOSAL | Status: DC | PRN

## 2018-07-31 MED ORDER — CEFAZOLIN SODIUM 1 G IJ SOLR
INTRAMUSCULAR | Status: AC
  Filled 2018-07-31: qty 20

## 2018-07-31 MED ORDER — SODIUM CHLORIDE 0.9 % IV SOLN: 250.0000 mL | INTRAVENOUS | Status: DC

## 2018-07-31 MED ORDER — IOPAMIDOL (ISOVUE-370) INJECTION 76%
INTRAVENOUS | Status: AC
  Filled 2018-07-31: qty 100

## 2018-07-31 MED ORDER — KETOROLAC TROMETHAMINE 15 MG/ML IJ SOLN
INTRAMUSCULAR | Status: AC
  Administered 2018-07-31: 15 mg
  Filled 2018-07-31: qty 1

## 2018-07-31 MED ORDER — SODIUM CHLORIDE 0.9 % IV SOLN
INTRAVENOUS | Status: DC | PRN
  Administered 2018-07-31 (×2)

## 2018-07-31 MED ORDER — KETOROLAC TROMETHAMINE 30 MG/ML IJ SOLN
15.0000 mg | Freq: Four times a day (QID) | INTRAMUSCULAR | Status: DC
  Administered 2018-07-31 – 2018-08-01 (×3): 15 mg via INTRAVENOUS
  Filled 2018-07-31 (×3): qty 1

## 2018-07-31 MED ORDER — THROMBIN 20000 UNITS EX KIT
PACK | CUTANEOUS | Status: AC
  Filled 2018-07-31: qty 1

## 2018-07-31 MED ORDER — ALBUMIN HUMAN 5 % IV SOLN
INTRAVENOUS | Status: DC | PRN
  Administered 2018-07-31 (×2): via INTRAVENOUS

## 2018-07-31 MED ORDER — HYDROMORPHONE HCL 1 MG/ML IJ SOLN: 0.2500 mg | INTRAMUSCULAR | Status: DC | PRN

## 2018-07-31 MED ORDER — DEXAMETHASONE SODIUM PHOSPHATE 10 MG/ML IJ SOLN
INTRAMUSCULAR | Status: DC | PRN
  Administered 2018-07-31: 10 mg via INTRAVENOUS

## 2018-07-31 MED ORDER — SODIUM CHLORIDE 0.9 % IV SOLN
INTRAVENOUS | Status: DC | PRN
  Administered 2018-07-31: 12:00:00 via INTRAVENOUS
  Administered 2018-07-31: 15 ug/min via INTRAVENOUS

## 2018-07-31 MED ORDER — HYDROCHLOROTHIAZIDE 12.5 MG PO CAPS: 12.5000 mg | ORAL_CAPSULE | Freq: Every day | ORAL | Status: DC

## 2018-07-31 SURGICAL SUPPLY — 85 items
BAG DECANTER FOR FLEXI CONT (MISCELLANEOUS) ×2 IMPLANT
BENZOIN TINCTURE PRP APPL 2/3 (GAUZE/BANDAGES/DRESSINGS) IMPLANT
BLADE CLIPPER SURG (BLADE) IMPLANT
BUR ACRON 5.0MM COATED (BURR) ×2 IMPLANT
BUR MATCHSTICK NEURO 3.0 LAGG (BURR) ×2 IMPLANT
CAGE POST LUM 6D 9X23X9 (Cage) ×4 IMPLANT
CAGE POST LUM TRIT 11X23X9 6D (Cage) ×4 IMPLANT
CANISTER SUCT 3000ML PPV (MISCELLANEOUS) ×2 IMPLANT
CAP LCK SPNE (Orthopedic Implant) ×8 IMPLANT
CAP LOCK SPINE RADIUS (Orthopedic Implant) ×8 IMPLANT
CAP LOCKING (Orthopedic Implant) ×8 IMPLANT
CARTRIDGE OIL MAESTRO DRILL (MISCELLANEOUS) ×1 IMPLANT
CONT SPEC 4OZ CLIKSEAL STRL BL (MISCELLANEOUS) ×2 IMPLANT
COVER BACK TABLE 60X90IN (DRAPES) ×2 IMPLANT
DECANTER SPIKE VIAL GLASS SM (MISCELLANEOUS) ×2 IMPLANT
DERMABOND ADVANCED (GAUZE/BANDAGES/DRESSINGS) ×1
DERMABOND ADVANCED .7 DNX12 (GAUZE/BANDAGES/DRESSINGS) ×1 IMPLANT
DIFFUSER DRILL AIR PNEUMATIC (MISCELLANEOUS) ×2 IMPLANT
DRAPE C-ARM 42X72 X-RAY (DRAPES) ×4 IMPLANT
DRAPE C-ARMOR (DRAPES) IMPLANT
DRAPE HALF SHEET 40X57 (DRAPES) ×4 IMPLANT
DRAPE INCISE IOBAN 66X45 STRL (DRAPES) ×2 IMPLANT
DRAPE LAPAROTOMY 100X72X124 (DRAPES) ×2 IMPLANT
DRAPE POUCH INSTRU U-SHP 10X18 (DRAPES) ×2 IMPLANT
ELECT REM PT RETURN 9FT ADLT (ELECTROSURGICAL) ×2
ELECTRODE REM PT RTRN 9FT ADLT (ELECTROSURGICAL) ×1 IMPLANT
GAUZE 4X4 16PLY RFD (DISPOSABLE) IMPLANT
GAUZE SPONGE 4X4 12PLY STRL (GAUZE/BANDAGES/DRESSINGS) ×4 IMPLANT
GAUZE SPONGE 4X4 16PLY XRAY LF (GAUZE/BANDAGES/DRESSINGS) ×2 IMPLANT
GLOVE BIO SURGEON STRL SZ 6.5 (GLOVE) ×6 IMPLANT
GLOVE BIO SURGEON STRL SZ8 (GLOVE) ×2 IMPLANT
GLOVE BIOGEL PI IND STRL 6.5 (GLOVE) ×2 IMPLANT
GLOVE BIOGEL PI IND STRL 8 (GLOVE) ×2 IMPLANT
GLOVE BIOGEL PI IND STRL 8.5 (GLOVE) ×1 IMPLANT
GLOVE BIOGEL PI INDICATOR 6.5 (GLOVE) ×2
GLOVE BIOGEL PI INDICATOR 8 (GLOVE) ×2
GLOVE BIOGEL PI INDICATOR 8.5 (GLOVE) ×1
GLOVE ECLIPSE 7.5 STRL STRAW (GLOVE) ×6 IMPLANT
GOWN STRL REUS W/ TWL LRG LVL3 (GOWN DISPOSABLE) ×3 IMPLANT
GOWN STRL REUS W/ TWL XL LVL3 (GOWN DISPOSABLE) ×1 IMPLANT
GOWN STRL REUS W/TWL 2XL LVL3 (GOWN DISPOSABLE) ×2 IMPLANT
GOWN STRL REUS W/TWL LRG LVL3 (GOWN DISPOSABLE) ×3
GOWN STRL REUS W/TWL XL LVL3 (GOWN DISPOSABLE) ×1
KIT BASIN OR (CUSTOM PROCEDURE TRAY) ×2 IMPLANT
KIT INFUSE SMALL (Orthopedic Implant) ×2 IMPLANT
KIT TURNOVER KIT B (KITS) ×2 IMPLANT
NEEDLE 18GX1X1/2 (RX/OR ONLY) (NEEDLE) ×2 IMPLANT
NEEDLE ASP BONE MRW 8GX15 (NEEDLE) ×2 IMPLANT
NEEDLE FENESTRATED 8GA X 6IN (NEEDLE) ×2 IMPLANT
NEEDLE SPNL 18GX3.5 QUINCKE PK (NEEDLE) ×2 IMPLANT
NEEDLE SPNL 22GX3.5 QUINCKE BK (NEEDLE) ×2 IMPLANT
NS IRRIG 1000ML POUR BTL (IV SOLUTION) ×2 IMPLANT
OIL CARTRIDGE MAESTRO DRILL (MISCELLANEOUS) ×2
PACK LAMINECTOMY NEURO (CUSTOM PROCEDURE TRAY) ×2 IMPLANT
PAD ARMBOARD 7.5X6 YLW CONV (MISCELLANEOUS) ×6 IMPLANT
PATTIES SURGICAL .5 X.5 (GAUZE/BANDAGES/DRESSINGS) IMPLANT
PATTIES SURGICAL .5 X1 (DISPOSABLE) ×2 IMPLANT
PATTIES SURGICAL 1X1 (DISPOSABLE) ×2 IMPLANT
PEN SKIN MARKING BROAD (MISCELLANEOUS) ×2 IMPLANT
ROD 70MM (Rod) ×2 IMPLANT
ROD SPNL 70X5.5XNS TI RDS (Rod) ×2 IMPLANT
SCREW 5.75 X 635 (Screw) ×2 IMPLANT
SCREW 5.75X40M (Screw) ×4 IMPLANT
SCREW 5.75X45MM (Screw) ×4 IMPLANT
SCREW 6.75X35MM (Screw) ×4 IMPLANT
SCREW RADIUS 5.75X25MM (Screw) ×2 IMPLANT
SPONGE LAP 4X18 RFD (DISPOSABLE) IMPLANT
SPONGE NEURO XRAY DETECT 1X3 (DISPOSABLE) IMPLANT
SPONGE SURGIFOAM ABS GEL 100 (HEMOSTASIS) ×2 IMPLANT
STAPLER SKIN PROX WIDE 3.9 (STAPLE) IMPLANT
STRIP BIOACTIVE VITOSS 25X100X (Neuro Prosthesis/Implant) ×4 IMPLANT
STRIP CLOSURE SKIN 1/2X4 (GAUZE/BANDAGES/DRESSINGS) IMPLANT
SUT PROLENE 6 0 BV (SUTURE) IMPLANT
SUT VIC AB 1 CT1 18XBRD ANBCTR (SUTURE) ×3 IMPLANT
SUT VIC AB 1 CT1 8-18 (SUTURE) ×3
SUT VIC AB 2-0 CP2 18 (SUTURE) ×6 IMPLANT
SYR 3ML LL SCALE MARK (SYRINGE) IMPLANT
SYR 5ML LL (SYRINGE) IMPLANT
SYR CONTROL 10ML LL (SYRINGE) ×4 IMPLANT
TAPE CLOTH SURG 4X10 WHT LF (GAUZE/BANDAGES/DRESSINGS) ×2 IMPLANT
TOWEL GREEN STERILE (TOWEL DISPOSABLE) ×2 IMPLANT
TOWEL GREEN STERILE FF (TOWEL DISPOSABLE) ×2 IMPLANT
TRAP SPECIMEN MUCOUS 40CC (MISCELLANEOUS) IMPLANT
TRAY FOLEY MTR SLVR 16FR STAT (SET/KITS/TRAYS/PACK) ×2 IMPLANT
WATER STERILE IRR 1000ML POUR (IV SOLUTION) ×2 IMPLANT

## 2018-07-31 NOTE — Anesthesia Preprocedure Evaluation (Addendum)
Anesthesia Evaluation  Patient identified by MRN, date of birth, ID band Patient awake    Reviewed: Allergy & Precautions, NPO status , Patient's Chart, lab work & pertinent test results  History of Anesthesia Complications (+) PONV and history of anesthetic complications  Airway Mallampati: I  TM Distance: >3 FB Neck ROM: Full    Dental  (+) Teeth Intact, Dental Advisory Given   Pulmonary    breath sounds clear to auscultation       Cardiovascular hypertension, Pt. on medications and Pt. on home beta blockers  Rhythm:Regular Rate:Normal     Neuro/Psych    GI/Hepatic Neg liver ROS, GERD  ,  Endo/Other  Hypothyroidism   Renal/GU negative Renal ROS     Musculoskeletal  (+) Arthritis , Fibromyalgia -  Abdominal Normal abdominal exam  (+)   Peds  Hematology   Anesthesia Other Findings   Reproductive/Obstetrics                            Anesthesia Physical Anesthesia Plan  ASA: II  Anesthesia Plan: General   Post-op Pain Management:    Induction: Intravenous  PONV Risk Score and Plan: 4 or greater and Ondansetron, Dexamethasone and Treatment may vary due to age or medical condition  Airway Management Planned: Oral ETT  Additional Equipment: None  Intra-op Plan:   Post-operative Plan: Extubation in OR  Informed Consent: I have reviewed the patients History and Physical, chart, labs and discussed the procedure including the risks, benefits and alternatives for the proposed anesthesia with the patient or authorized representative who has indicated his/her understanding and acceptance.   Dental advisory given  Plan Discussed with: CRNA  Anesthesia Plan Comments:       Anesthesia Quick Evaluation

## 2018-07-31 NOTE — Consult Note (Signed)
Neurology Consultation Reason for Consult: left arm numbness Referring Physician: Sherwood Gambler, R  CC: Left Arm numbness/speech difficulty  History is obtained from: Patient  HPI: Audrey [name] is a 77 y.o. female with a history of breast cancer who underwent surgery for neurogenic claudication today. On emergence from anesthesia, she had some confusion, and sounded slightly slurred. She then began complaining of left hand numbness and therefore a code stroke was activated.   On my arrival, she did have some hand numbness in the meidal two digits and ulner aspect of her hand. This did not cross the wrist.    LKW: prior to OR at 7:22 am  tpa given?: no, out of window, back surgery   ROS: A 14 point ROS was performed and is negative except as noted in the HPI.   Past Medical History:  Diagnosis Date  . Allergy   . Arthritis   . Bladder infection    hx of last one approx 5 years ago   . Breast cancer ([place]) 10/25/15   left breast  ADH   . Cancer ([place])    breast  . Complication of anesthesia    difficulty awakening   . Diastolic dysfunction   . Dyspnea    due to pain  . GERD (gastroesophageal reflux disease)    Tums if needed  . 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 Hypertension   . Hypothyroidism   . Obesity   . PONV (postoperative nausea and vomiting)   . Reflux    buring and dysphagia sx resp to PPI; egd 3/06 neg for Barretts  . 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 Yeast infection    hx of      Family History  Problem Relation Age of Onset  . Diabetes Mother   . Breast cancer Mother        dx. late 9s-early 46s; "metastasis to throat"?  . Hodgkin's lymphoma Father 44  . Breast cancer Sister        maternal half-sister dx. w/ DCIS in her late 59s;  . Other Sister        learning disabilities; lives on her own  . Diabetes Sister   . Breast cancer Maternal Aunt        dx. 47s  . Ovarian  cancer Maternal Aunt 79  . Breast cancer Paternal Aunt        dx. early 43s  . Stroke Maternal Grandfather   . Depression Paternal Grandmother   . Heart Problems Paternal Grandmother   . Alcohol abuse Brother   . Skin cancer Daughter        dx. early 39s  . Other Daughter        insterstitial cystitis  . Heart Problems Paternal Aunt      Social History:  reports that she has never smoked. She has never used smokeless tobacco. She reports that she does not drink alcohol or use drugs.   Exam: Current vital signs: BP 138/67 (BP Location: Right Arm)   Pulse 70   Temp (!) 97.5 F (36.4 C) (Oral)   Resp 18   SpO2 98%  Vital signs in last 24 hours: Temp:  [97.2 F (36.2 C)-98.1 F (36.7 C)] 97.5 F (36.4 C) (09/05 1709) Pulse Rate:  [70-97] 70 (09/05 1709) Resp:  [13-27] 18 (09/05 1709) BP: (118-159)/(44-85) 138/67 (09/05 1709) SpO2:  [93 %-98 %] 98 % (09/05 1709)   Physical  Exam  Constitutional: Appears well-developed and well-nourished.  Psych: Affect appropriate to situation Eyes: No scleral injection HENT: No OP obstrucion Head: Normocephalic.  Cardiovascular: Normal rate and regular rhythm.  Respiratory: Effort normal, non-labored breathing GI: Soft.  No distension. There is no tenderness.  Skin: WDI  Neuro: Mental Status: Patient is awake, alert, oriented to person, place, month, she has hesitancy of speech, and some stuttering, but appears more related to intubation rather than aphasia. Apble to repeat, name objects.  Cranial Nerves: II: Visual Fields are full. Pupils are equal, round, and reactive to light.   III,IV, VI: EOMI without ptosis or diploplia.  V: Facial sensation is symmetric to temperature VII: Facial movement possibly assymetric, but this could be due to positioning during surgery.  VIII: hearing is intact to voice X: Uvula elevates symmetrically XI: Shoulder shrug is symmetric. XII: tongue is midline without atrophy or fasciculations.   Motor: Tone is normal. Bulk is normal. 5/5 strength was present in all four extremities including interossei Sensory: Sensation is symmetric to light touch and temperature in the arms and legs, but there is diminished sensation on the ulner aspect of the hand.  Deep Tendon Reflexes: 2+ and symmetric in the biceps and patellae and triceps Cerebellar: FNF intact bilaterally  I have reviewed labs in epic and the results pertinent to this consultation are: CMP unremarkable  I have reviewed the images obtained: MRI brain - negative  Impression: 77 yo F with left hand numbness consistent with the ulner distribution, I suspect a neurapraxia from positioning. Her speech changes I think are a combination of post-anesthesia effect/intubation/positioning. At this point I do not suspect that she has had any type of cerebral event.   Recommendations: 1) I recommended MRI brain (this is available at the time of finalizing this note and is negative)    Roland Rack, MD [facility] [phone]  If 7pm- 7am, please page neurology on call as listed in [place].

## 2018-07-31 NOTE — Code Documentation (Signed)
77yo female post-op L3-L4 and L5-S1 PLIF today by Dr. Sherwood Gambler. Patient was LKW at [phone] prior to procedure. After surgery in PACU, patient complained of LUE numbness. Patient evaluated by Dr. Sherwood Gambler and found to have mild left facial weakness. Code stroke activated. Stroke team to bedside. NIHSS 1, see documentation for details and code stroke times. Patient with mild left facial weakness on exam. Patient reporting left hand numbness which is improving. Decision made to proceed to MRI instead of CT. Patient transported to MRI by Stroke and RRT RNs. While waiting in MRI, patient with increasing pain and nausea. Patient tearful. PACU RN called and responded to MRI to treat symptoms. Dr. Leonel Ramsay made aware, will attempt to proceed with MRI [facility] imaging once medications given. Dr. Sherwood Gambler updated on plan. Vital signs monitored during MRI. O2 saturations decreased to 85% while in MRI, and patient placed on 2L O2 nasal cannula and O2 saturations improved. Dr. Leonel Ramsay and Dr. Sherwood Gambler made aware that MRI was complete. No acute stroke treatment. Patient transported back to PACU. Bedside handoff with Lenna Sciara, RN.

## 2018-07-31 NOTE — Progress Notes (Signed)
I checked on Ms. Minerd and after she returned to the floor, and her symptoms have resolved.  At this time I do not feel any further work-up is needed from this perspective.  Please call if further questions or concerns.  Roland Rack, MD [facility] [phone]  If 7pm- 7am, please page neurology on call as listed in [place].

## 2018-07-31 NOTE — Transfer of Care (Signed)
Immediate Anesthesia Transfer of Care Note  Patient: Audrey [name]  Procedure(s) Performed: LUMBAR THREE-FOUR LUMBAR FIVE-SACRAL ONE DECOMPRESSION,POSTERIOR LUMBAR INTERBODY FUSION, POSTERIOR LATERAL ARTHRODESIS (N/A Back)  Patient Location: PACU  Anesthesia Type:General  Level of Consciousness: awake and oriented  Airway & Oxygen Therapy: Patient Spontanous Breathing  Post-op Assessment: Report given to RN and Patient moving all extremities X 4  Post vital signs: Reviewed and stable  Last Vitals:  Vitals Value Taken Time  BP 138/85 07/31/2018  1:54 PM  Temp    Pulse 89 07/31/2018  1:58 PM  Resp 23 07/31/2018  1:58 PM  SpO2 95 % 07/31/2018  1:58 PM  Vitals shown include unvalidated device data.  Last Pain:  Vitals:   07/31/18 0623  TempSrc: Oral  PainSc:       Patients Stated Pain Goal: 1 (43/20/03 7944)  Complications: No apparent anesthesia complications

## 2018-07-31 NOTE — Progress Notes (Signed)
Subjective: Called by PACU nurse because of patient having complaints of numbness in left upper extremity and to have weakness in left upper extremity.  I had evaluated the patient about a half an hour ago, at which time she had just arrived in the PACU.  At that point she was drowsy, but following commands with the lower extremities and with good strength in the distal lower extremities (DF and PF 5/5).  Objective: Vital signs in last 24 hours: Vitals:   07/31/18 0623 07/31/18 1355  BP: (!) 147/74 138/85  Pulse: 77 93  Resp: 20 16  Temp: 98.1 F (36.7 C) (!) 97.2 F (36.2 C)  TempSrc: Oral   SpO2: 95% 95%    Intake/Output from previous day: No intake/output data recorded. Intake/Output this shift: Total I/O In: 3370 [I.V.:2700; Blood:170; IV Piggyback:500] Out: 747 [Urine:347; Blood:400]  Physical Exam: Drowsy, speech slurred, but oriented to name Audrey [name]), year (2019), and place (hospital).  Following commands.  Mild flattening of left nasolabial fold, more noticeable when static as opposed to dynamic.  Mild weakness of left upper extremity as compared to right upper extremity.  Strength in lower extremities remains good, and symmetrical.  Assessment/Plan: Asked PACU staff to call in a code stroke  Dr.McNeill Leonel Ramsay from stroke neurology service team came to bedside and is assessing patient and has requested stroke protocol MRI.  We will continue to assess and decide on disposition.  Hosie Spangle, MD 07/31/2018, 2:34 PM

## 2018-07-31 NOTE — Progress Notes (Signed)
Received report from relief RN minutes after arrival to unit. At that time, pt was alert and oriented with coherent sentences and moving everything purposefully. Minimal numbness in left upper extremity. Pt progressively got worse with increased numbness in left arm and significant weakness in left arm with right upper extremity with full sensation and normal movement/strength. Pt began saying incoherent sentences such as repeatedly saying "no words" and "name" could not answer questions appropriately. Dr. Sherwood Gambler called with new findings and MD came to bedside where upon his assessment a Code Stroke was called. Stroke team, Dr. Sherwood Gambler, Dr. Smith Robert MDA, and Dr. Leonel Ramsay with Neurology all at bedside. Pt went down for stat MRI. Will continue to monitor.

## 2018-07-31 NOTE — Anesthesia Procedure Notes (Signed)

## 2018-07-31 NOTE — Op Note (Signed)
07/31/2018  2:58 PM  PATIENT:  Audrey [name]  77 y.o. 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; grade 2 dynamic degenerative spondylolisthesis, L5 on S1; left L3-4 synovial cyst; lumbar spondylosis; lumbar degenerative disc disease; status post L4-5 arthrodesis  PROCEDURE:  Procedure(s): Bilateral L3-4 and L5-S1 lumbar decompression including laminectomy, facetectomy, and foraminotomies, for decompression of stenotic compression of the exiting L3, L4, L5, and S1 nerve roots bilaterally, with decompression beyond that required for interbody arthrodesis, resection of left L3-4 synovial cyst; bilateral L3-4 and L5-S1 posterior lumbar interbody arthrodesis with Tritanium interbody implants, Vitoss BA with bone marrow aspirate, and infuse; bilateral L3-4 and L5-S1 posterior lateral arthrodesis with segmental radius posterior instrumentation, Vitoss BA with bone marrow aspirate, and infuse  SURGEON: Jovita Gamma, MD  ASSISTANTS: Kary Kos, MD  ANESTHESIA:   general  EBL:  Total I/O In: 3370 [I.V.:2700; Blood:170; IV Piggyback:500] Out: 747 [Urine:347; Blood:400]  BLOOD ADMINISTERED:170 CC CELLSAVER  COUNT:  Correct per nursing staff  DICTATION: Patient was brought to the operating room placed under general endotracheal anesthesia. The patient was turned to prone position, the lumbar region was prepped with Betadine soap and solution and draped in a sterile fashion.  The previous midline incision was marked.  The midline  was infiltrated with local anesthesia with epinephrine. A localizing x-ray was taken and then a midline incision was made and carried down through the subcutaneous tissue, bipolar cautery and electrocautery were used to maintain hemostasis. Dissection was carried down to the lumbar fascia. The fascia was incised bilaterally and the paraspinal muscles were dissected with a spinous process and lamina in a subperiosteal fashion.  Particular care was taken at the L4-5 level where previous laminotomies have been performed.  Another x-ray was taken for localization and the L3-4 and L5-S1 levels were localized. Dissection was then carried out laterally over the facet complexes and the ala of S1 and the transverse processes of L3, L4, and L5 were exposed and decorticated.   We proceeded with the decompression.  Laminectomies were performed bilaterally at the L3-4 and L5-S1 levels bilaterally using the high-speed drill and Kerrison punches.  There was marked hypertrophic facet arthropathy, and bilateral facetectomies were performed using the high-speed drill and Kerrison punches at the L3-4 and L5-S1 levels.  There was ligament of flavum thickening that was carefully removed exposing the thecal sac and exiting nerve roots.  On the left side at L3-4 there was a moderate sized synovial cyst that was carefully freed from the thecal sac and removed.  We then proceeded with foraminotomies with decompression of the stenotic compression of the exiting L3, L4, L5, and S1 nerve roots bilaterally. Once the decompression of the stenotic compression of the thecal sac and exiting nerve roots was completed we proceeded with the posterior lumbar interbody arthrodesis. The annulus at each level was incised bilaterally and the disc space entered. A thorough discectomy was performed using pituitary rongeurs and curettes. Once the discectomy was completed we began to prepare the endplate surfaces, removing the cartilaginous endplates  surface. We then measured the height of the intervertebral disc space. We selected 11 x 23 x 6 x 9  Tritanium interbody implants for the L3-4 level, and 9 x 23 x 6 x 9 Tritanium interbody implants for the L5-S1 level.  The C-arm fluoroscope was then draped and brought in the field and we identified the pedicle entry points bilaterally at the L3, L4, L5, and S1 levels. Each of the 8 pedicles was probed, we aspirated bone marrow aspirate from the vertebral bodies, this was injected over two 10 cc strips of Vitoss BA. Then each of the pedicles was examined with the ball probe, good bony surfaces were found and no bony cuts were found. Each of the pedicles was then tapped with a 5.25 mm tap, again examined with the ball probe good threading was found and no bony cuts were found. We then placed 5.75 by 45 millimeter screws bilaterally at the L3 level, 5.75 by 40 millimeter screws bilaterally at the L4 level, 5.75 x 35 mm on the left at the L5 level and 5.75 x 25 mm on the right at the L5 level, and 6.75 by 35 millimeter screws bilaterally at the S1 level.  We then packed the interbody implants with Vitoss BA with bone marrow aspirate infuse, and then placed the first implant at the L3-4 level on the right side, carefully retracting the thecal sac and nerve root medially. We then went back to the left side and packed the midline with additional Vitoss BA with bone marrow aspirate and infuse, and then placed a second implant on the left side again retracting the thecal sac and nerve root medially. Additional Vitoss BA with bone marrow aspirate and infuse was packed lateral to the implants.  Then at the L5-S1 level, we placed the first implant on the right side, carefully retracting the thecal sac and nerve root medially. We then went back to the left side and packed the midline with additional Vitoss BA with bone marrow aspirate and infuse, and then placed a second implant on the left side, again retracting the thecal  sac and nerve root medially. Additional Vitoss BA with bone marrow aspirate and infuse was packed lateral to the implants.   We then packed the lateral gutter over the transverse processes and intertransverse space at L3-4 and L5-S1 with Vitoss BA with bone marrow aspirate and infuse. We then selected 70 mm pre-lordosed rods, they were placed within the screw heads and secured with locking caps once all 8 locking caps were placed final tightening was performed against a counter torque.  The wound had been irrigated multiple times during the procedure with saline solution and bacitracin solution, good hemostasis was established with a combination of bipolar cautery and Gelfoam with thrombin.  Gelfoam was removed, and a thin layer of Surgifoam applied.  Once good hemostasis was confirmed we proceeded with closure paraspinal muscles deep fascia and Scarpa's fascia were closed with interrupted undyed 1 Vicryl sutures the subcutaneous and subcuticular closed with interrupted inverted 2-0 undyed Vicryl sutures the skin edges were approximated with Dermabond.  A dressing of sterile gauze and Hypafix was applied.  Following surgery the patient was turned back to the supine position to be reversed and the anesthetic extubated and transferred to the recovery room for further care.    PLAN OF CARE: Admit to inpatient   PATIENT DISPOSITION:  PACU - hemodynamically stable.   Delay start of Pharmacological VTE agent (>24hrs) due to surgical blood loss or risk of bleeding:  yes

## 2018-07-31 NOTE — Progress Notes (Signed)
Subjective: Patient up and ambulating in the halls on the [facility] unit.  Patient underwent stroke protocol MRI of the brain, and Dr. 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  The patient's PACU nurse, Melissa, felt that when the patient returned from MRI, that she had normal responsiveness and movement of her upper and lower extremities.  She was therefore transferred to the [facility] unit.  Objective: Vital signs in last 24 hours: Vitals:   07/31/18 1600 07/31/18 1615 07/31/18 1645 07/31/18 1709  BP: (!) 143/66 133/81 140/65 138/67  Pulse: 72 76 71 70  Resp:  (!) 27 16 18   Temp:  98 F (36.7 C)  (!) 97.5 F (36.4 C)  TempSrc:    Oral  SpO2: 93% 98% 95% 98%    Intake/Output from previous day: No intake/output data recorded. Intake/Output this shift: Total I/O In: 3370 [I.V.:2700; Blood:170; IV Piggyback:500] Out: 1147 [Urine:747; Blood:400]  Physical Exam: Patient is awake alert, fully oriented.  Her speech is fluent, and she follows commands briskly.  Facial asymmetry essentially resolved.  Strength 5/5 in grips and intrinsics bilaterally.  Dressing clean and dry.  Studies/Results: Dg Lumbar Spine 2-3 Views  Result Date: 07/31/2018 CLINICAL DATA:  L3-S1 lumbar fusion. EXAM: DG C-ARM 61-120 MIN; LUMBAR SPINE - 2-3 VIEW COMPARISON:  07/31/2018 and 01/07/2018 radiographs FINDINGS: Intraoperative spot views of the lumbar spine are submitted postoperatively for interpretation. Interval placement of bipedicular screws at L3, L4, L5 and S1 noted with placement of cage devices at L3-4 and L5-S1. Prior cage devices at L4-5 again noted. IMPRESSION: Lumbar fusion hardware as described. Electronically Signed   By: Margarette Canada M.D.   On: 07/31/2018 14:35   Dg Lumbar Spine Complete  Result Date: 07/31/2018 CLINICAL DATA:  Localization for surgery. EXAM: LUMBAR SPINE - COMPLETE 4+ VIEW COMPARISON:  MRI  01/09/2018 FINDINGS: Cross-table portable views of the lumbar spine were obtained. Again noted is an interbody device at L4-L5 and disc space narrowing at L5-S1. The first image has localization needles along the L1 spinous process and between the L3 and L4 spinous processes. The second image has localization needles at spinous processes of L3-L4 and at L5-S1. Third image has localization needles along the posterior aspect of L3 and L4-L5. The fourth localization image has needles at the level of L4 and L5-S1. IMPRESSION: Needle localization for surgery. Electronically Signed   By: Markus Daft M.D.   On: 07/31/2018 11:24   Mr Brain Wo Contrast  Result Date: 07/31/2018 CLINICAL DATA:  LEFT facial weakness, LEFT hand numbness. Follow up code stroke. Status post lumbar spine surgery today. EXAM: MRI HEAD WITHOUT CONTRAST TECHNIQUE: Multiplanar, multiecho pulse sequences of the brain and surrounding structures were obtained without intravenous contrast. COMPARISON:  None. FINDINGS: INTRACRANIAL CONTENTS: No reduced diffusion to suggest acute ischemia. No susceptibility artifact to suggest hemorrhage. The ventricles and sulci are normal for patient's age. Patchy to confluent supratentorial white matter FLAIR T2 hyperintensities. No suspicious parenchymal signal, masses, mass effect. No abnormal extra-axial fluid collections. No extra-axial masses. VASCULAR: Normal major intracranial vascular flow voids present at skull base. SKULL AND UPPER CERVICAL SPINE: No abnormal sellar expansion. No suspicious calvarial bone marrow signal. Craniocervical junction maintained. SINUSES/ORBITS: Minimal maxillary sinus air-fluid levels. The included ocular globes and orbital contents are non-suspicious. OTHER: None. IMPRESSION: 1. No acute intracranial process. 2. 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 Electronically Signed  By: Elon Alas M.D.   On: 07/31/2018 16:15   Dg C-arm 1-60 Min  Result Date:  07/31/2018 CLINICAL DATA:  L3-S1 lumbar fusion. EXAM: DG C-ARM 61-120 MIN; LUMBAR SPINE - 2-3 VIEW COMPARISON:  07/31/2018 and 01/07/2018 radiographs FINDINGS: Intraoperative spot views of the lumbar spine are submitted postoperatively for interpretation. Interval placement of bipedicular screws at L3, L4, L5 and S1 noted with placement of cage devices at L3-4 and L5-S1. Prior cage devices at L4-5 again noted. IMPRESSION: Lumbar fusion hardware as described. Electronically Signed   By: Margarette Canada M.D.   On: 07/31/2018 14:35   Dg C-arm 1-60 Min  Result Date: 07/31/2018 CLINICAL DATA:  L3-S1 lumbar fusion. EXAM: DG C-ARM 61-120 MIN; LUMBAR SPINE - 2-3 VIEW COMPARISON:  07/31/2018 and 01/07/2018 radiographs FINDINGS: Intraoperative spot views of the lumbar spine are submitted postoperatively for interpretation. Interval placement of bipedicular screws at L3, L4, L5 and S1 noted with placement of cage devices at L3-4 and L5-S1. Prior cage devices at L4-5 again noted. IMPRESSION: Lumbar fusion hardware as described. Electronically Signed   By: Margarette Canada M.D.   On: 07/31/2018 14:35    Assessment/Plan: It is unclear what caused the patient's altered responsiveness and left-sided facial and upper extremity weakness.  It may be that the facial asymmetry was due to typical swelling and positioning of the endotracheal tube during a lengthy surgery in a prone position, and that as time is gone on and the swelling is cleared, the face no longer appears asymmetric.  Dr. Leonel Ramsay suspects that she may have had some mild ulnar neuropathy, again from lengthy positioning through the surgical procedure.  The patient notes that she is no longer having the numbness that she was experiencing in the PACU.  It is certainly encouraging that there is no evidence of stroke seen on the MRI of the brain.  From the perspective of her lumbar decompression fusion she is doing well.  She is had good relief of the disabling neurogenic  claudication.  She is ambulating actively in the halls.  She is asking for the Foley catheter to be removed, and the nursing staff will monitor her voiding function.  She has been encouraged to ambulate at least another couple times through this evening and again more in the morning.  Her husband was with her as she, her nurse, and I walked with her through the halls. I explained to both the patient and her husband the findings of the MRI, Dr. Cecil Cobbs assessment, and the uncertainty of what caused the difficulties, that developed half an hour or so after she arrived in the PACU.  The good news is though that those symptoms have resolved.  Hosie Spangle, MD 07/31/2018, 5:53 PM

## 2018-07-31 NOTE — H&P (Signed)
Subjective: Patient is a 77 y.o. 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  There is underlying advanced degenerative disc disease and spondylosis at both levels.  She has a solid fusion at the L4-5 level third she is been having increasing difficulties with low back pain radiating into the lower extremities bilaterally consistent with neurogenic claudication.  Patient has been treated with numerous nonsurgical measures including NSAIDs, gabapentin, physical therapy, and epidural steroid injections without relief.  Is admitted now for a L3-4 and L5-S1 lumbar decompression, posterior lumbar interbody arthrodesis with interbody implants and bone graft, and posterior lateral arthrodesis with posterior instrumentation and bone graft.   Patient Active Problem List   Diagnosis Date Noted  . Hereditary and idiopathic peripheral neuropathy 06/05/2016  . Abnormality of gait 06/05/2016  . Family history of breast cancer in female 02/03/2016  . Family history of ovarian cancer 02/03/2016  . Peripheral neuropathy 12/16/2015  . Breast cancer of upper-outer quadrant of left female breast ([place]) 12/12/2015  . Acute medial meniscal tear 04/12/2015  . Fibromyalgia 02/02/2015  . Tachycardia   . Diastolic dysfunction   . Essential hypertension, benign 03/31/2014  . Other specified cardiac dysrhythmias(427.89) 03/31/2014   Past Medical History:  Diagnosis Date  . Allergy   . Arthritis   . Bladder infection    hx of last one approx 5 years ago   . Breast cancer ([place]) 10/25/15   left breast  ADH   . Cancer ([place])    breast  . Complication of anesthesia    difficulty awakening   . Diastolic dysfunction   . Dyspnea    due to pain  . GERD (gastroesophageal reflux  disease)    Tums if needed  . 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 Hypertension   . Hypothyroidism   . Obesity   . PONV (postoperative nausea and vomiting)   . Reflux    buring and dysphagia sx resp to PPI; egd 3/06 neg for Barretts  . 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 Yeast infection    hx of     Past Surgical History:  Procedure Laterality Date  . AXILLARY LYMPH NODE BIOPSY Left 12/27/2015   Procedure: LEFT AXILLARY SENTINEL LYMPH NODE BIOPSY;  Surgeon: [name], MD;  Location: [facility];  Service: General;  Laterality: Left;  . BELPHAROPTOSIS REPAIR Bilateral   . BREAST LUMPECTOMY WITH RADIOACTIVE SEED LOCALIZATION Left 12/01/2015   Procedure: LEFT BREAST [place] RADIOACTIVE SEED LOCALIZATION;  Surgeon: [name], MD;  Location: [facility];  Service: General;  Laterality: Left;  . COLONOSCOPY  3/06   NEG  . cortisone injections     right arm; back  . EYE SURGERY     cosmetic; eyelid surgery bilat   . FINGER SURGERY     left index finger/ cyst 02/2007  . KNEE ARTHROSCOPY Right 04/13/2015   Procedure: ARTHROSCOPY RIGHT KNEE WITH MEDIAL MENISCAL DEBRIDEMENT AND CHONDROPLASTY;  Surgeon: Gaynelle Arabian, MD;  Location: [facility];  Service: Orthopedics;  Laterality: Right;  . left rotator cuff repair     12/2006  . LUMBAR LAMINECTOMY    . TONSILLECTOMY      Medications Prior to Admission  Medication Sig Dispense Refill Last Dose  . Calcium Carb-Cholecalciferol (CALCIUM + D3) 600-200 MG-UNIT TABS Take 1 each by mouth daily.    Past Week at Unknown time  . Cholecalciferol (VITAMIN D3 PO) Take 2,000 Units by mouth daily.   Past Week at Unknown time  . Co-Enzyme Q10 200 MG CAPS Take 1 capsule by mouth daily.    Past Week at Unknown time  . Cranberry 200 MG CAPS Take 1 capsule by mouth daily.    Past Week at Unknown time  . Cyanocobalamin (VITAMIN B-12) 5000 MCG SUBL Place 1 tablet under the tongue  daily.   Past Week at Unknown time  . gabapentin (NEURONTIN) 300 MG capsule ONE CAPSULE TWICE DURING THE DAY AND 2 AT NIGHT (Patient taking differently: Take 300-600 mg by mouth See admin instructions. ONE CAPSULE TWICE DURING THE DAY AND 2 AT NIGHT) 360 capsule 3 07/31/2018 at Unknown time  . Glucos-Chondroit-Hyaluron-MSM (GLUCOSAMINE CHONDROITIN JOINT PO) Take 1 tablet by mouth daily.   Past Week at Unknown time  . hydrochlorothiazide (MICROZIDE) 12.5 MG capsule Take 1 capsule (12.5 mg total) by mouth daily. 90 capsule 1 Past Week at Unknown time  . levothyroxine (SYNTHROID, LEVOTHROID) 25 MCG tablet Take 25 mcg by mouth 4 (four) times a week.   07/30/2018 at Unknown time  . Multiple Vitamins-Minerals (PRESERVISION AREDS 2) CAPS Take 1 tablet by mouth 2 (two) times daily.    Past Week at Unknown time  . nebivolol (BYSTOLIC) 5 MG tablet Take 5 mg by mouth daily.   07/31/2018 at 0445  . Propylene Glycol (SYSTANE BALANCE) 0.6 % SOLN Place 1 drop into both eyes every morning.   07/31/2018 at Unknown time  . pyridOXINE (VITAMIN B-6) 100 MG tablet Take 100 mg by mouth daily.   Past Week at Unknown time  . calcium carbonate (TUMS - DOSED IN MG ELEMENTAL CALCIUM) 500 MG chewable tablet Chew 1 tablet by mouth daily as needed for indigestion.    More than a month at Unknown time  . loratadine (CLARITIN) 10 MG tablet Take 10 mg by mouth daily as needed. Reported on 05/03/2016   More than a month at Unknown time   Allergies  Allergen Reactions  . Contrast Media [Iodinated Diagnostic Agents] Shortness Of Breath  . Other Swelling    Cat gut sutures Cats and dogs                Derma bond- intolerance- itching- tolerates with Benadryl      . Cymbalta [Duloxetine Hcl]     Diarrhea  . Macrodantin [Nitrofurantoin] Rash    Social History   Tobacco Use  . Smoking status: Never Smoker  . Smokeless tobacco: Never Used  Substance Use Topics  . Alcohol use: No    Family History  Problem Relation Age of Onset  .  Diabetes Mother   . Breast cancer Mother        dx. late 48s-early 64s; "metastasis to throat"?  . Hodgkin's lymphoma Father 62  . Breast cancer Sister        maternal half-sister dx. w/ DCIS in her late 58s;  . Other Sister        learning disabilities; lives on her own  . Diabetes Sister   . Breast cancer Maternal Aunt        dx. 47s  . Ovarian cancer Maternal Aunt 40  . Breast cancer Paternal Aunt        dx. early 25s  .  Stroke Maternal Grandfather   . Depression Paternal Grandmother   . Heart Problems Paternal Grandmother   . Alcohol abuse Brother   . Skin cancer Daughter        dx. early 54s  . Other Daughter        insterstitial cystitis  . Heart Problems Paternal Aunt      Review of Systems Pertinent items noted in HPI and remainder of comprehensive ROS otherwise negative.  Objective: Vital signs in last 24 hours: Temp:  [98.1 F (36.7 C)] 98.1 F (36.7 C) (09/05 0623) Pulse Rate:  [77] 77 (09/05 0623) Resp:  [20] 20 (09/05 0623) BP: (147)/(74) 147/74 (09/05 0623) SpO2:  [95 %] 95 % (09/05 0623)  EXAM: Patient is well-developed well-nourished white female in no acute distress.   Lungs are clear to auscultation , the patient has symmetrical respiratory excursion. Heart has a regular rate and rhythm normal S1 and S2 no murmur.   Abdomen is soft nontender nondistended bowel sounds are present. Extremity examination shows no clubbing cyanosis or edema. Motor examination shows 5 over 5 strength in the lower extremities including the iliopsoas quadriceps dorsiflexor extensor hallicus  longus and plantar flexor bilaterally. Sensation is intact to pinprick in the distal lower extremities. Reflexes are symmetrical bilaterally. No pathologic reflexes are present. Patient has a normal gait and stance.   Data Review:CBC    Component Value Date/Time   WBC 7.0 07/24/2018 1146   RBC 4.89 07/24/2018 1146   HGB 14.9 07/24/2018 1146   HGB 13.9 11/30/2016 1358   HCT 43.3  07/24/2018 1146   HCT 39.0 11/30/2016 1358   PLT 365 07/24/2018 1146   PLT 332 11/30/2016 1358   MCV 88.5 07/24/2018 1146   MCV 86.1 11/30/2016 1358   MCH 30.5 07/24/2018 1146   MCHC 34.4 07/24/2018 1146   RDW 13.5 07/24/2018 1146   RDW 13.5 11/30/2016 1358   LYMPHSABS 1.7 01/17/2018 1054   LYMPHSABS 1.6 11/30/2016 1358   MONOABS 0.6 01/17/2018 1054   MONOABS 0.5 11/30/2016 1358   EOSABS 0.2 01/17/2018 1054   EOSABS 0.1 11/30/2016 1358   BASOSABS 0.0 01/17/2018 1054   BASOSABS 0.0 11/30/2016 1358                          BMET    Component Value Date/Time   NA 140 07/24/2018 1146   NA 141 11/30/2016 1358   K 3.5 07/24/2018 1146   K 3.5 11/30/2016 1358   CL 101 07/24/2018 1146   CO2 30 07/24/2018 1146   CO2 29 11/30/2016 1358   GLUCOSE 98 07/24/2018 1146   GLUCOSE 103 11/30/2016 1358   BUN 11 07/24/2018 1146   BUN 14.2 11/30/2016 1358   CREATININE 0.89 07/24/2018 1146   CREATININE 0.8 11/30/2016 1358   CALCIUM 9.8 07/24/2018 1146   CALCIUM 9.5 11/30/2016 1358   GFRNONAA >60 07/24/2018 1146   GFRAA >60 07/24/2018 1146     Assessment/Plan: Patient with neurogenic claudication with advanced degeneration at the L3-4 and L5-S1 levels who is admitted now for L3-4 and L4-5 lumbar decompression and arthrodesis.  I've discussed with the patient the nature of his condition, the nature the surgical procedure, the typical length of surgery, hospital stay, and overall recuperation, the limitations postoperatively, and risks of surgery. I discussed risks including risks of infection, bleeding, possibly need for transfusion, the risk of nerve root dysfunction with pain, weakness, numbness, or paresthesias, the risk of dural tear  and CSF leakage and possible need for further surgery, the risk of failure of the arthrodesis and possibly for further surgery, the risk of anesthetic complications including myocardial infarction, stroke, pneumonia, and death. We discussed the need for  postoperative immobilization in a lumbar brace. Understanding all this the patient does wish to proceed with surgery and is admitted for such.   Hosie Spangle, MD 07/31/2018 7:07 AM

## 2018-07-31 NOTE — Anesthesia Postprocedure Evaluation (Signed)
Anesthesia Post Note  Patient: Audrey [name]  Procedure(s) Performed: LUMBAR THREE-FOUR LUMBAR FIVE-SACRAL ONE DECOMPRESSION,POSTERIOR LUMBAR INTERBODY FUSION, POSTERIOR LATERAL ARTHRODESIS (N/A Back)     Patient location during evaluation: PACU Anesthesia Type: General Level of consciousness: awake and alert Pain management: pain level controlled Vital Signs Assessment: post-procedure vital signs reviewed and stable Respiratory status: spontaneous breathing, nonlabored ventilation, respiratory function stable and patient connected to nasal cannula oxygen Cardiovascular status: blood pressure returned to baseline and stable Postop Assessment: no apparent nausea or vomiting Anesthetic complications: no    Last Vitals:  Vitals:   07/31/18 1645 07/31/18 1709  BP: 140/65 138/67  Pulse: 71 70  Resp: 16 18  Temp:  (!) 36.4 C  SpO2: 95% 98%    Last Pain:  Vitals:   07/31/18 1709  TempSrc: Oral  PainSc:     LLE Motor Response: Purposeful movement (07/31/18 1811) LLE Sensation: Full sensation (07/31/18 1811) RLE Motor Response: Purposeful movement (07/31/18 1811) RLE Sensation: Full sensation (07/31/18 1811)      Effie Berkshire

## 2018-08-01 MED ORDER — HYDROCODONE-ACETAMINOPHEN 5-325 MG PO TABS: 1.0000 | ORAL_TABLET | ORAL | 0 refills | Status: DC | PRN

## 2018-08-01 MED FILL — Thrombin For Soln Kit 20000 Unit: CUTANEOUS | Qty: 1 | Status: AC

## 2018-08-01 NOTE — Discharge Instructions (Signed)
°  Call Your Doctor If Any of These Occur °Redness, drainage, or swelling at the wound.  °Temperature greater than 101 degrees. °Severe pain not relieved by pain medication. °Incision starts to come apart. °Follow Up Appt °Call today for appointment in 3 weeks ([phone]) or for problems.  If you have any hardware placed in your spine, you will need an x-ray before your appointment. °

## 2018-08-01 NOTE — Discharge Summary (Signed)
Physician Discharge Summary  Patient ID: Audrey [name] MRN: [id number] DOB/AGE: Mar 17, 1941 77 y.o.  Admit date: 07/31/2018 Discharge date: 08/01/2018  Admission Diagnoses:  L3-4 and L5-S1 multilevel, multifactorial lumbar stenosis with neurogenic claudication; grade 1-2 dynamic degenerative spondylolisthesis, L3 on L4; grade 2 dynamic degenerative spondylolisthesis, L5 on S1; left L3-4 synovial cyst; lumbar spondylosis; lumbar degenerative disc disease; status post L4-5 arthrodesis  Discharge Diagnoses:  L3-4 and L5-S1 multilevel, multifactorial lumbar stenosis with neurogenic claudication; grade 1-2 dynamic degenerative spondylolisthesis, L3 on L4; grade 2 dynamic degenerative spondylolisthesis, L5 on S1; left L3-4 synovial cyst; lumbar spondylosis; lumbar degenerative disc disease; status post L4-5 arthrodesis Active Problems:   Lumbar stenosis with neurogenic claudication   Discharged Condition: good  Hospital Course: Patient was admitted, underwent L3-4 and L5-S1 lumbar decompression and arthrodesis.  She had some difficulties while in the PACU, and was evaluated for possible stroke.  MRI of the brain was negative.  The difficulties cleared fully.  Postoperatively she has done well otherwise.  She is up and ambulate the actively.  Her dressing was removed and the incision is healing nicely.  There is no erythema, phimosis, swelling, or drainage.  She is voiding well.  She is asking to be discharged home.  She has been given instruction regarding wound care and activities following discharge.  She is scheduled for follow-up with me in 3 weeks.  Discharge Exam: Blood pressure 104/65, pulse 74, temperature 98 F (36.7 C), temperature source Oral, resp. rate 20, SpO2 99 %.  Disposition: Discharge disposition: 01-Home or Self Care       Discharge Instructions    Discharge wound care:   Complete by:  As directed    Leave the wound open to air. Shower daily with the wound uncovered.  Water and soapy water should run over the incision area. Do not wash directly on the incision for 2 weeks. Remove the glue after 2 weeks.   Driving Restrictions   Complete by:  As directed    No driving for 2 weeks. May ride in the car locally now. May begin to drive locally in 2 weeks.   Other Restrictions   Complete by:  As directed    Walk gradually increasing distances out in the fresh air at least twice a day. Walking additional 6 times inside the house, gradually increasing distances, daily. No bending, lifting, or twisting. Perform activities between shoulder and waist height (that is at counter height when standing or table height when sitting).     Allergies as of 08/01/2018      Reactions   Contrast Media [iodinated Diagnostic Agents] Shortness Of Breath   Other Swelling   Cat gut sutures Cats and dogs               Derma bond- intolerance- itching- tolerates with Benadryl       Cymbalta [duloxetine Hcl]    Diarrhea   Macrodantin [nitrofurantoin] Rash      Medication List    TAKE these medications   Calcium + D3 600-200 MG-UNIT Tabs Take 1 each by mouth daily.   calcium carbonate 500 MG chewable tablet Commonly known as:  TUMS - dosed in mg elemental calcium Chew 1 tablet by mouth daily as needed for indigestion.   Co-Enzyme Q10 200 MG Caps Take 1 capsule by mouth daily.   Cranberry 200 MG Caps Take 1 capsule by mouth daily.   gabapentin 300 MG capsule Commonly known as:  NEURONTIN ONE CAPSULE TWICE DURING THE DAY  AND 2 AT NIGHT What changed:    how much to take  how to take this  when to take this   GLUCOSAMINE CHONDROITIN JOINT PO Take 1 tablet by mouth daily.   hydrochlorothiazide 12.5 MG capsule Commonly known as:  MICROZIDE Take 1 capsule (12.5 mg total) by mouth daily.   HYDROcodone-acetaminophen 5-325 MG tablet Commonly known as:  NORCO/VICODIN Take 1-2 tablets by mouth every 4 (four) hours as needed (pain).   levothyroxine 25 MCG  tablet Commonly known as:  SYNTHROID, LEVOTHROID Take 25 mcg by mouth 4 (four) times a week.   loratadine 10 MG tablet Commonly known as:  CLARITIN Take 10 mg by mouth daily as needed. Reported on 05/03/2016   nebivolol 5 MG tablet Commonly known as:  BYSTOLIC Take 5 mg by mouth daily.   PRESERVISION AREDS 2 Caps Take 1 tablet by mouth 2 (two) times daily.   pyridOXINE 100 MG tablet Commonly known as:  VITAMIN B-6 Take 100 mg by mouth daily.   SYSTANE BALANCE 0.6 % Soln Generic drug:  Propylene Glycol Place 1 drop into both eyes every morning.   Vitamin B-12 5000 MCG Subl Place 1 tablet under the tongue daily.   VITAMIN D3 PO Take 2,000 Units by mouth daily.            Discharge Care Instructions  (From admission, onward)         Start     Ordered   08/01/18 0000  Discharge wound care:    Comments:  Leave the wound open to air. Shower daily with the wound uncovered. Water and soapy water should run over the incision area. Do not wash directly on the incision for 2 weeks. Remove the glue after 2 weeks.   08/01/18 0840           Signed: Hosie Spangle 08/01/2018, 8:40 AM

## 2018-08-01 NOTE — Progress Notes (Signed)
Patient alert and oriented, mae's well, voiding adequate amount of urine, swallowing without difficulty, no c/o pain at time of discharge. Patient discharged home with family. Script and discharged instructions given to patient. Patient and family stated understanding of instructions given. Patient has an appointment with Dr. Nudelman 

## 2018-08-07 MED FILL — Heparin Sodium (Porcine) Inj 1000 Unit/ML: INTRAMUSCULAR | Qty: 30 | Status: AC

## 2018-08-07 MED FILL — Sodium Chloride IV Soln 0.9%: INTRAVENOUS | Qty: 2000 | Status: AC

## 2018-08-11 DIAGNOSIS — M549 Dorsalgia, unspecified: Secondary | ICD-10-CM | POA: Diagnosis not present

## 2018-08-11 DIAGNOSIS — N390 Urinary tract infection, site not specified: Secondary | ICD-10-CM | POA: Diagnosis not present

## 2018-08-11 DIAGNOSIS — R350 Frequency of micturition: Secondary | ICD-10-CM | POA: Diagnosis not present

## 2018-08-20 DIAGNOSIS — D3132 Benign neoplasm of left choroid: Secondary | ICD-10-CM | POA: Diagnosis not present

## 2018-08-20 DIAGNOSIS — H2513 Age-related nuclear cataract, bilateral: Secondary | ICD-10-CM | POA: Diagnosis not present

## 2018-08-20 DIAGNOSIS — H5203 Hypermetropia, bilateral: Secondary | ICD-10-CM | POA: Diagnosis not present

## 2018-08-20 DIAGNOSIS — H353111 Nonexudative age-related macular degeneration, right eye, early dry stage: Secondary | ICD-10-CM | POA: Diagnosis not present

## 2018-08-21 DIAGNOSIS — M48062 Spinal stenosis, lumbar region with neurogenic claudication: Secondary | ICD-10-CM | POA: Diagnosis not present

## 2018-08-26 HISTORY — PX: CATARACT EXTRACTION, BILATERAL: SHX1313

## 2018-09-04 DIAGNOSIS — H25811 Combined forms of age-related cataract, right eye: Secondary | ICD-10-CM | POA: Diagnosis not present

## 2018-09-04 DIAGNOSIS — H2511 Age-related nuclear cataract, right eye: Secondary | ICD-10-CM | POA: Diagnosis not present

## 2018-09-11 DIAGNOSIS — H2512 Age-related nuclear cataract, left eye: Secondary | ICD-10-CM | POA: Diagnosis not present

## 2018-09-11 DIAGNOSIS — H25812 Combined forms of age-related cataract, left eye: Secondary | ICD-10-CM | POA: Diagnosis not present

## 2018-09-24 DIAGNOSIS — Z23 Encounter for immunization: Secondary | ICD-10-CM | POA: Diagnosis not present

## 2018-10-15 DIAGNOSIS — Z981 Arthrodesis status: Secondary | ICD-10-CM | POA: Diagnosis not present

## 2018-11-04 ENCOUNTER — Encounter: Payer: Self-pay | Admitting: Hematology

## 2018-11-04 DIAGNOSIS — Z853 Personal history of malignant neoplasm of breast: Secondary | ICD-10-CM | POA: Diagnosis not present

## 2018-12-11 ENCOUNTER — Encounter: Payer: Self-pay | Admitting: Adult Health

## 2018-12-11 ENCOUNTER — Ambulatory Visit: Payer: PPO | Admitting: Adult Health

## 2018-12-11 VITALS — BP 130/81 | HR 75 | Ht 59.0 in | Wt 167.8 lb

## 2018-12-11 DIAGNOSIS — G609 Hereditary and idiopathic neuropathy, unspecified: Secondary | ICD-10-CM | POA: Diagnosis not present

## 2018-12-11 DIAGNOSIS — G4700 Insomnia, unspecified: Secondary | ICD-10-CM | POA: Diagnosis not present

## 2018-12-11 MED ORDER — GABAPENTIN 300 MG PO CAPS: ORAL_CAPSULE | ORAL | 3 refills | Status: DC

## 2018-12-11 NOTE — Progress Notes (Signed)
I have read the note, and I agree with the clinical assessment and plan.  Kathrynn Ducking  3

## 2018-12-11 NOTE — Patient Instructions (Signed)
Your Plan:  Continue using gabapentin  Try melatonin 3-6 mg 2 hours before bedtime for sleep- Max 10 mg before bedtime If your symptoms worsen or you develop new symptoms please let us know.   Thank you for coming to see [place] at [facility]. I hope we have been able to provide you high quality care today.  You may receive a patient satisfaction survey over the next few weeks. We would appreciate your feedback and comments so that we may continue to improve ourselves and the health of our patients.

## 2018-12-11 NOTE — Progress Notes (Signed)
PATIENT: Audrey [name] DOB: [birth year]  REASON FOR VISIT: follow up HISTORY FROM: patient  HISTORY OF PRESENT ILLNESS: Today 12/11/18:  Audrey [name] is a 78 year old female with a history of peripheral neuropathy.  She returns today for follow-up.  She is currently taking gabapentin 300 mg in the morning and at noon and 600 mg at bedtime.  She states on occasion she will forget the lunchtime dose.  She states for the most part gabapentin controls her neuropathy discomfort.  She denies any pain.  Reports that her her gait and balance has remained relatively stable.  Reports that she had back surgery in September.  She denies any falls.  She does not use a cane or a walker.  She does do yoga and feels it is helpful.  She states that her feet feel cold but not to touch.  She states that she has trouble falling asleep.  She states that it can sometimes take several hours for her to go to sleep.  She is not taking anything for her sleep and has never tried any over-the-counter medication.  HISTORY Audrey [name] is a 78 year old right-handed white female with a history of a peripheral neuropathy that has been well controlled with the use of gabapentin taking 300 mg twice during the day and 600 mg at night.  The patient is tolerating the gabapentin quite well.  She has had fibromyalgia since she was in her 72s, she indicates that her fibromyalgia pain is worsening.  She has been on Cymbalta in the past and did not tolerate the medication.  She has been on Effexor and did tolerate it but was not sure why she stopped the medication.  She tries to do yoga once or twice a week which seems to help.  The patient is having difficulty remaining active.  She may have significant episodes of severe pain that may last up to a week.  She returns to the office today for an evaluation.  REVIEW OF SYSTEMS: Out of a complete 14 system review of symptoms, the patient complains only of the following symptoms, and all other  reviewed systems are negative.  See HPI  ALLERGIES: Allergies  Allergen Reactions  . Contrast Media [Iodinated Diagnostic Agents] Shortness Of Breath  . Other Swelling    Cat gut sutures Cats and dogs                Derma bond- intolerance- itching- tolerates with Benadryl      . Cymbalta [Duloxetine Hcl]     Diarrhea  . Macrodantin [Nitrofurantoin] Rash    HOME MEDICATIONS: Outpatient Medications Prior to Visit  Medication Sig Dispense Refill  . Calcium Carb-Cholecalciferol (CALCIUM + D3) 600-200 MG-UNIT TABS Take 1 each by mouth daily.     . calcium carbonate (TUMS - DOSED IN MG ELEMENTAL CALCIUM) 500 MG chewable tablet Chew 1 tablet by mouth daily as needed for indigestion.     . Cholecalciferol (VITAMIN D3 PO) Take 2,000 Units by mouth daily.    Marland Kitchen Co-Enzyme Q10 200 MG CAPS Take 1 capsule by mouth daily.     . Cranberry 200 MG CAPS Take 1 capsule by mouth daily.     . Cyanocobalamin (VITAMIN B-12) 5000 MCG SUBL Place 1 tablet under the tongue daily.    Marland Kitchen gabapentin (NEURONTIN) 300 MG capsule ONE CAPSULE TWICE DURING THE DAY AND 2 AT NIGHT (Patient taking differently: Take 300-600 mg by mouth See admin instructions. ONE CAPSULE TWICE DURING THE DAY AND  2 AT NIGHT) 360 capsule 3  . Glucos-Chondroit-Hyaluron-MSM (GLUCOSAMINE CHONDROITIN JOINT PO) Take 1 tablet by mouth daily.    . hydrochlorothiazide (MICROZIDE) 12.5 MG capsule Take 1 capsule (12.5 mg total) by mouth daily. 90 capsule 1  . levothyroxine (SYNTHROID, LEVOTHROID) 25 MCG tablet Take 25 mcg by mouth 4 (four) times a week.    . loratadine (CLARITIN) 10 MG tablet Take 10 mg by mouth daily as needed. Reported on 05/03/2016    . meloxicam (MOBIC) 15 MG tablet Take 15 mg by mouth as needed for pain.    . Multiple Vitamins-Minerals (PRESERVISION AREDS 2) CAPS Take 1 tablet by mouth 2 (two) times daily.     . nebivolol (BYSTOLIC) 5 MG tablet Take 5 mg by mouth daily.    Marland Kitchen Propylene Glycol (SYSTANE BALANCE) 0.6 % SOLN Place 1  drop into both eyes every morning.    . pyridOXINE (VITAMIN B-6) 100 MG tablet Take 100 mg by mouth daily.    Marland Kitchen HYDROcodone-acetaminophen (NORCO/VICODIN) 5-325 MG tablet Take 1-2 tablets by mouth every 4 (four) hours as needed (pain). 40 tablet 0   No facility-administered medications prior to visit.     PAST MEDICAL HISTORY: Past Medical History:  Diagnosis Date  . Allergy   . Arthritis   . Bladder infection    hx of last one approx 5 years ago   . Breast cancer ([place]) 10/25/15   left breast  ADH   . Cancer ([place])    breast  . Complication of anesthesia    difficulty awakening   . Diastolic dysfunction   . Dyspnea    due to pain  . GERD (gastroesophageal reflux disease)    Tums if needed  . 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 Hypertension   . Hypothyroidism   . Obesity   . PONV (postoperative nausea and vomiting)   . Reflux    buring and dysphagia sx resp to PPI; egd 3/06 neg for Barretts  . 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 Yeast infection    hx of     PAST SURGICAL HISTORY: Past Surgical History:  Procedure Laterality Date  . AXILLARY LYMPH NODE BIOPSY Left 12/27/2015   Procedure: LEFT AXILLARY SENTINEL LYMPH NODE BIOPSY;  Surgeon: [name], MD;  Location: [facility];  Service: General;  Laterality: Left;  . BELPHAROPTOSIS REPAIR Bilateral   . BREAST LUMPECTOMY WITH RADIOACTIVE SEED LOCALIZATION Left 12/01/2015   Procedure: LEFT BREAST [place] RADIOACTIVE SEED LOCALIZATION;  Surgeon: [name], MD;  Location: [facility];  Service: General;  Laterality: Left;  . CATARACT EXTRACTION, BILATERAL Bilateral 08/2018  . COLONOSCOPY  3/06   NEG  . cortisone injections     right arm; back  . EYE SURGERY     cosmetic; eyelid surgery bilat   . FINGER SURGERY     left index finger/ cyst 02/2007  . KNEE ARTHROSCOPY Right 04/13/2015   Procedure: ARTHROSCOPY RIGHT KNEE WITH MEDIAL  MENISCAL DEBRIDEMENT AND CHONDROPLASTY;  Surgeon: Gaynelle Arabian, MD;  Location: [facility];  Service: Orthopedics;  Laterality: Right;  . left rotator cuff repair     12/2006  . LUMBAR LAMINECTOMY    . TONSILLECTOMY      FAMILY HISTORY: Family History  Problem Relation Age of Onset  . Diabetes Mother   . Breast cancer Mother        dx. late 77s-early 72s; "metastasis to throat"?  Marland Kitchen  Hodgkin's lymphoma Father 43  . Breast cancer Sister        maternal half-sister dx. w/ DCIS in her late 33s;  . Other Sister        learning disabilities; lives on her own  . Diabetes Sister   . Breast cancer Maternal Aunt        dx. 58s  . Ovarian cancer Maternal Aunt 43  . Breast cancer Paternal Aunt        dx. early 73s  . Stroke Maternal Grandfather   . Depression Paternal Grandmother   . Heart Problems Paternal Grandmother   . Alcohol abuse Brother   . Skin cancer Daughter        dx. early 60s  . Other Daughter        insterstitial cystitis  . Heart Problems Paternal Aunt     SOCIAL HISTORY: Social History   Socioeconomic History  . Marital status: Married    Spouse name: Not on file  . Number of children: 2  . Years of education: 30  . Highest education level: Not on file  Occupational History  . Occupation: retired  Scientific laboratory technician  . Financial resource strain: Not on file  . Food insecurity:    Worry: Not on file    Inability: Not on file  . Transportation needs:    Medical: Not on file    Non-medical: Not on file  Tobacco Use  . Smoking status: Never Smoker  . Smokeless tobacco: Never Used  Substance and Sexual Activity  . Alcohol use: No  . Drug use: No  . Sexual activity: Not on file  Lifestyle  . Physical activity:    Days per week: Not on file    Minutes per session: Not on file  . Stress: Not on file  Relationships  . Social connections:    Talks on phone: Not on file    Gets together: Not on file    Attends religious service: Not on file    Active member of  club or organization: Not on file    Attends meetings of clubs or organizations: Not on file    Relationship status: Not on file  . Intimate partner violence:    Fear of current or ex partner: Not on file    Emotionally abused: Not on file    Physically abused: Not on file    Forced sexual activity: Not on file  Other Topics Concern  . Not on file  Social History Narrative   Patient is right handed.   Patient drinks 2-3 cups of caffeine per day.      PHYSICAL EXAM  Vitals:   12/11/18 1344  BP: 130/81  Pulse: 75  Weight: 167 lb 12.8 oz (76.1 kg)  Height: 4\' 11"  (1.499 m)   Body mass index is 33.89 kg/m.  Generalized: Well developed, in no acute distress   Neurological examination  Mentation: Alert oriented to time, place, history taking. Follows all commands speech and language fluent Cranial nerve II-XII: Pupils were equal round reactive to light. Extraocular movements were full, visual field were full on confrontational test. Facial sensation and strength were normal. Uvula tongue midline. Head turning and shoulder shrug  were normal and symmetric. Motor: The motor testing reveals 5 over 5 strength of all 4 extremities. Good symmetric motor tone is noted throughout.  Sensory: Sensory testing is intact to soft touch on all 4 extremities. No evidence of extinction is noted.  Coordination: Cerebellar testing reveals good  finger-nose-finger and heel-to-shin bilaterally.  Gait and station: Gait is normal.  Reflexes: Deep tendon reflexes are symmetric and normal bilaterally.   DIAGNOSTIC DATA (LABS, IMAGING, TESTING) - I reviewed patient records, labs, notes, testing and imaging myself where available.  Lab Results  Component Value Date   WBC 7.0 07/24/2018   HGB 14.9 07/24/2018   HCT 43.3 07/24/2018   MCV 88.5 07/24/2018   PLT 365 07/24/2018      Component Value Date/Time   NA 140 07/24/2018 1146   NA 141 11/30/2016 1358   K 3.5 07/24/2018 1146   K 3.5 11/30/2016  1358   CL 101 07/24/2018 1146   CO2 30 07/24/2018 1146   CO2 29 11/30/2016 1358   GLUCOSE 98 07/24/2018 1146   GLUCOSE 103 11/30/2016 1358   BUN 11 07/24/2018 1146   BUN 14.2 11/30/2016 1358   CREATININE 0.89 07/24/2018 1146   CREATININE 0.8 11/30/2016 1358   CALCIUM 9.8 07/24/2018 1146   CALCIUM 9.5 11/30/2016 1358   PROT 7.2 01/17/2018 1054   PROT 7.1 11/30/2016 1358   ALBUMIN 3.9 01/17/2018 1054   ALBUMIN 4.0 11/30/2016 1358   AST 22 01/17/2018 1054   AST 25 11/30/2016 1358   ALT 22 01/17/2018 1054   ALT 20 11/30/2016 1358   ALKPHOS 65 01/17/2018 1054   ALKPHOS 71 11/30/2016 1358   BILITOT 0.7 01/17/2018 1054   BILITOT 0.55 11/30/2016 1358   GFRNONAA >60 07/24/2018 1146   GFRAA >60 07/24/2018 1146      ASSESSMENT AND PLAN 78 y.o. year old female  has a past medical history of Allergy, Arthritis, Bladder infection, Breast cancer ([facility]) (10/25/15), Cancer ([facility]), Complication of anesthesia, Diastolic dysfunction, Dyspnea, GERD (gastroesophageal reflux disease), Hereditary and idiopathic peripheral neuropathy (02/10/2015), Hypertension, Hypothyroidism, Obesity, PONV (postoperative nausea and vomiting), Reflux, Tachycardia ( ), and Yeast infection. here with:  1.  Peripheral neuropathy 2.  Chronic insomnia  The patient will continue on gabapentin as it is working well for her neuropathy.  I have advised that if her symptoms worsen she should let us know.  She will try melatonin 3 to 6 mg 2 hours before bedtime.  If this is not beneficial for her sleep she can let us know.  She will follow-up in 6 months or sooner if needed.     Ward Givens, MSN, NP-C 12/11/2018, 2:14 PM [facility] [address], [place], [place] [address] [phone]

## 2018-12-21 ENCOUNTER — Other Ambulatory Visit: Payer: Self-pay | Admitting: Neurology

## 2018-12-30 DIAGNOSIS — E039 Hypothyroidism, unspecified: Secondary | ICD-10-CM | POA: Diagnosis not present

## 2018-12-30 DIAGNOSIS — L309 Dermatitis, unspecified: Secondary | ICD-10-CM | POA: Diagnosis not present

## 2018-12-30 DIAGNOSIS — E559 Vitamin D deficiency, unspecified: Secondary | ICD-10-CM | POA: Diagnosis not present

## 2018-12-30 DIAGNOSIS — E7800 Pure hypercholesterolemia, unspecified: Secondary | ICD-10-CM | POA: Diagnosis not present

## 2018-12-30 DIAGNOSIS — I10 Essential (primary) hypertension: Secondary | ICD-10-CM | POA: Diagnosis not present

## 2018-12-30 DIAGNOSIS — K219 Gastro-esophageal reflux disease without esophagitis: Secondary | ICD-10-CM | POA: Diagnosis not present

## 2018-12-31 DIAGNOSIS — H26491 Other secondary cataract, right eye: Secondary | ICD-10-CM | POA: Diagnosis not present

## 2018-12-31 DIAGNOSIS — H0011 Chalazion right upper eyelid: Secondary | ICD-10-CM | POA: Diagnosis not present

## 2018-12-31 DIAGNOSIS — H353112 Nonexudative age-related macular degeneration, right eye, intermediate dry stage: Secondary | ICD-10-CM | POA: Diagnosis not present

## 2018-12-31 DIAGNOSIS — D3122 Benign neoplasm of left retina: Secondary | ICD-10-CM | POA: Diagnosis not present

## 2019-01-01 ENCOUNTER — Telehealth: Payer: Self-pay | Admitting: *Deleted

## 2019-01-01 NOTE — Telephone Encounter (Signed)
Medical records faxed to [facility]; [phone]

## 2019-01-08 DIAGNOSIS — R8290 Unspecified abnormal findings in urine: Secondary | ICD-10-CM | POA: Diagnosis not present

## 2019-01-14 NOTE — Progress Notes (Signed)
[facility]   [phone] [phone]   Clinic Follow up Note   Patient Care Team: Griffin, Elaine, MD as PCP - General (Family Medicine) Turner, Traci R, MD as Consulting Physician (Cardiology) Wolicki, Karol, MD as Consulting Physician (Otolaryngology) Newman, David, MD as Consulting Physician (General Surgery) Feng, Yan, MD as Consulting Physician (Hematology) Moody, John, MD as Consulting Physician (Radiation Oncology) Mackey, Heather Thompson, NP as Nurse Practitioner (Hematology and Oncology) Fogleman, Kelly, MD as Consulting Physician (Obstetrics and Gynecology)  Date of Service:  01/16/2019  CHIEF COMPLAINT: Follow up left breast cancer   SUMMARY OF ONCOLOGIC HISTORY: Oncology History   Breast cancer of upper-outer quadrant of left female breast (HCC)   Staging form: Breast, AJCC 7th Edition     Pathologic stage from 11/29/2015: Stage IA (T1c, N0, cM0) - Signed by Yan Feng, MD on 01/14/2016     Clinical: Stage IA (T1c, N0, M0) - Unsigned        Breast cancer of upper-outer quadrant of left female breast (HCC)   07/18/2014 Procedure    Hereditary cancer panel: no deleterious mutation at ATM, BARD1, BRCA1, BRCA2, BRIP1, CDH1, CHEK2, EPCAM, MLH1, MSH2, MSH6, NBN, PALB2, PMS2, PTEN, RAD51C, RAD51D, STK11, and TP53.    10/19/2015 Mammogram    1.3 cm irregular focal asymmetry in the left breast    10/25/2015 Initial Biopsy    Left breast core needle biopsy showed atypical ductal Hyperplasia    12/01/2015 Definitive Surgery    Left breast lumpectomy showed invasive ductal carcinoma, 1.2 cm, grade 2, and DCIS with necrosis and calcification, margins were negative.ER+ (90%), PR+ (20%), HER2/neu negative, Ki67 5%    12/01/2015 Pathologic Stage    Stage IA: pT1c pN0    12/01/2015 Oncotype testing    RS 7, which predicts 10- year distant recurrence risk of 6% with tamoxifen alone.    12/27/2015 Pathology Results    Second surgery with sentinel  lymph node biopsy showed no malignant cells in 5 lymph nodes    01/23/2016 - 02/17/2016 Radiation Therapy    Adjuvant left Breast radiation: Left breast treated to 42.5 Gy in 17 fractions, Left breast boost treated to 7.5 Gy in 3 fractions      Anti-estrogen oral therapy    She declined adjuvant AI or tamoxifen     05/03/2016 Survivorship    SCP visit completed    10/31/2017 Mammogram    IMPRESSION: There is no mammographic evidence of malignancy.       CURRENT THERAPY:  Surveillance  INTERVAL HISTORY:  Audrey [name] is here for a follow up of left breast cancer. She was last seen by me 1 year ago. She presents to the clinic today by herself. She notes she is doing well. She had back surgery in 07/2018. She did not need rehab afterward. She now denies any back pain now. She also had cataract surgery. She notes having left axillary soreness. She note she has been going to the gym more lately so she attributes it to the pain. She notes her acid reflux is flaring. She is apprehensive about restarting Nexium, but she has been on Tums and vinegar. er only endoscopy was 20 years ago.  She sees her new PCP every year in September, otherwise as needed.     REVIEW OF SYSTEMS:   Constitutional: Denies fevers, chills or abnormal weight loss Eyes: Denies blurriness of vision Ears, nose, mouth, throat, and face: Denies mucositis or sore throat Respiratory: Denies cough, dyspnea   or wheezes Cardiovascular: Denies palpitation, chest discomfort or lower extremity swelling Gastrointestinal:  Denies nausea, change in bowel habits (+) Acid reflux and Heartburn Skin: Denies abnormal skin rashes MSK: (+) Left axillary soreness  Lymphatics: Denies new lymphadenopathy or easy bruising Neurological:Denies numbness, tingling or new weaknesses Behavioral/Psych: Mood is stable, no new changes  All other systems were reviewed with the patient and are negative.  MEDICAL HISTORY:  Past Medical History:    Diagnosis Date  . Allergy   . Arthritis   . Bladder infection    hx of last one approx 5 years ago   . 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 Cancer ([place])    breast  . Complication of anesthesia    difficulty awakening   . Diastolic dysfunction   . Dyspnea    due to pain  . GERD (gastroesophageal reflux disease)    Tums if needed  . 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 Hypertension   . Hypothyroidism   . Obesity   . PONV (postoperative nausea and vomiting)   . Reflux    buring and dysphagia sx resp to PPI; egd 3/06 neg for Barretts  . 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 Yeast infection    hx of     SURGICAL HISTORY: Past Surgical History:  Procedure Laterality Date  . AXILLARY LYMPH NODE BIOPSY Left 12/27/2015   Procedure: LEFT AXILLARY SENTINEL LYMPH NODE BIOPSY;  Surgeon: [name], MD;  Location: [facility];  Service: General;  Laterality: Left;  . BELPHAROPTOSIS REPAIR Bilateral   . BREAST LUMPECTOMY WITH RADIOACTIVE SEED LOCALIZATION Left 12/01/2015   Procedure: LEFT BREAST [place] RADIOACTIVE SEED LOCALIZATION;  Surgeon: [name], MD;  Location: [facility];  Service: General;  Laterality: Left;  . CATARACT EXTRACTION, BILATERAL Bilateral 08/2018  . COLONOSCOPY  3/06   NEG  . cortisone injections     right arm; back  . EYE SURGERY     cosmetic; eyelid surgery bilat   . FINGER SURGERY     left index finger/ cyst 02/2007  . KNEE ARTHROSCOPY Right 04/13/2015   Procedure: ARTHROSCOPY RIGHT KNEE WITH MEDIAL MENISCAL DEBRIDEMENT AND CHONDROPLASTY;  Surgeon: Gaynelle Arabian, MD;  [facility];  Service: Orthopedics;  Laterality: Right;  . left rotator cuff repair     12/2006  . LUMBAR LAMINECTOMY    . TONSILLECTOMY      I have reviewed the social history and family history with the patient and they are unchanged from previous note.  ALLERGIES:  is allergic to  contrast media [iodinated diagnostic agents]; other; cymbalta [duloxetine hcl]; and macrodantin [nitrofurantoin].  MEDICATIONS:  Current Outpatient Medications  Medication Sig Dispense Refill  . Calcium Carb-Cholecalciferol (CALCIUM + D3) 600-200 MG-UNIT TABS Take 1 each by mouth daily.     . calcium carbonate (TUMS - DOSED IN MG ELEMENTAL CALCIUM) 500 MG chewable tablet Chew 1 tablet by mouth daily as needed for indigestion.     . Cholecalciferol (VITAMIN D3 PO) Take 2,000 Units by mouth daily.    Marland Kitchen Co-Enzyme Q10 200 MG CAPS Take 1 capsule by mouth daily.     . Cranberry 200 MG CAPS Take 1 capsule by mouth daily.     . Cyanocobalamin (VITAMIN B-12) 5000 MCG SUBL Place 1 tablet under the tongue daily.    Marland Kitchen gabapentin (NEURONTIN) 300 MG capsule ONE CAPSULE TWICE DURING THE DAY  AND 2 AT NIGHT 360 capsule 3  . Glucos-Chondroit-Hyaluron-MSM (GLUCOSAMINE CHONDROITIN JOINT PO) Take 1 tablet by mouth daily.    . hydrochlorothiazide (MICROZIDE) 12.5 MG capsule Take 1 capsule (12.5 mg total) by mouth daily. 90 capsule 1  . levothyroxine (SYNTHROID, LEVOTHROID) 25 MCG tablet Take 25 mcg by mouth 4 (four) times a week.    . loratadine (CLARITIN) 10 MG tablet Take 10 mg by mouth daily as needed. Reported on 05/03/2016    . meloxicam (MOBIC) 15 MG tablet Take 15 mg by mouth as needed for pain.    . Multiple Vitamins-Minerals (PRESERVISION AREDS 2) CAPS Take 1 tablet by mouth 2 (two) times daily.     . nebivolol (BYSTOLIC) 5 MG tablet Take 5 mg by mouth daily.    . Propylene Glycol (SYSTANE BALANCE) 0.6 % SOLN Place 1 drop into both eyes every morning.    . pyridOXINE (VITAMIN B-6) 100 MG tablet Take 100 mg by mouth daily.     No current facility-administered medications for this visit.     PHYSICAL EXAMINATION: ECOG PERFORMANCE STATUS: 0 - Asymptomatic  Vitals:   01/16/19 1300  BP: (!) 148/86  Pulse: 71  Resp: 18  Temp: 98 F (36.7 C)  SpO2: 99%   Filed Weights   01/16/19 1300  Weight: 169 lb  3.2 oz (76.7 kg)    GENERAL:alert, no distress and comfortable SKIN: skin color, texture, turgor are normal, no rashes or significant lesions EYES: normal, Conjunctiva are pink and non-injected, sclera clear OROPHARYNX:no exudate, no erythema and lips, buccal mucosa, and tongue normal  NECK: supple, thyroid normal size, non-tender, without nodularity LYMPH:  no palpable lymphadenopathy in the cervical, axillary or inguinal LUNGS: clear to auscultation and percussion with normal breathing effort HEART: regular rate & rhythm and no murmurs and no lower extremity edema ABDOMEN:abdomen soft, non-tender and normal bowel sounds Musculoskeletal:no cyanosis of digits and no clubbing  NEURO: alert & oriented x 3 with fluent speech, no focal motor/sensory deficits BREAST: S/p left breast lumpectomy: Surgical incision healed well (+) No palpable mass or adenopathy   LABORATORY DATA:  I have reviewed the data as listed CBC Latest Ref Rng & Units 01/16/2019 07/24/2018 01/17/2018  WBC 4.0 - 10.5 K/uL 7.1 7.0 7.2  Hemoglobin 12.0 - 15.0 g/dL 14.4 14.9 15.0  Hematocrit 36.0 - 46.0 % 41.2 43.3 43.1  Platelets 150 - 400 K/uL 366 365 355     CMP Latest Ref Rng & Units 01/16/2019 07/24/2018 01/17/2018  Glucose 70 - 99 mg/dL 96 98 95  BUN 8 - 23 mg/dL 10 11 10  Creatinine 0.44 - 1.00 mg/dL 0.92 0.89 0.84  Sodium 135 - 145 mmol/L 141 140 141  Potassium 3.5 - 5.1 mmol/L 3.8 3.5 3.2(L)  Chloride 98 - 111 mmol/L 100 101 100  CO2 22 - 32 mmol/L 29 30 30(H)  Calcium 8.9 - 10.3 mg/dL 10.2 9.8 10.3  Total Protein 6.5 - 8.1 g/dL 7.5 - 7.2  Total Bilirubin 0.3 - 1.2 mg/dL 0.9 - 0.7  Alkaline Phos 38 - 126 U/L 81 - 65  AST 15 - 41 U/L 26 - 22  ALT 0 - 44 U/L 16 - 22      RADIOGRAPHIC STUDIES: I have personally reviewed the radiological images as listed and agreed with the findings in the report. No results found.   ASSESSMENT & PLAN:  Audrey [name] is a 77 y.o. female with   1. 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 She is s/p left breast lumpectomy, adjuvant radiation.  -Oncotype revealed recurrence score 7, which is a low risk, she would not benefit from adjuvant chemotherapy.  -She declined anti-estrogen therapy.  -She is clinically doing well. Lab reviewed, her CBC and CMP are within normal limits. Her physical exam was unremarkable. There is no clinical concern for recurrence. -Continue breast cancer surveillance, with annual screening mammogram, self exam, and routine office visits with labs. -Her last Mammogram in 10/2018 at [facility], I will request a copy from them. Next in 10/2019.  -I encouraged her to watch for signs of cancer recurrence.  -F/u in 1 year and follow up with PCP and Dr. Lucia Gaskins in interim.   2. HTN, arthritis,  -She will continue follow-up with her primary care physician, and exercise  3. Genetic testing was negative  4.  Fibromyalgia and body pain -Her chronic body pain has improved with medication  -Follow-up with PCP Dr. Maudie Mercury  5. Hypothyroidism -Managed by her PCP  6. Bone Health  -2013 DEXA show low bone density. Her last DEXA through Dr. Maudie Mercury was at least 3 years ago.  -I will order one to be done in 10/2019 -Continue calcium and Vitamin D daily   Plan -Lab and follow-up in 1 year. -mammogram and DEXA in 10/2019   No problem-specific Assessment & Plan notes found for this encounter.   Orders Placed This Encounter  Procedures  . MM DIAG BREAST TOMO BILATERAL    Standing Status:   Future    Standing Expiration Date:   01/17/2020    Scheduling Instructions:     [facility]    Order Specific Question:   Reason for Exam (SYMPTOM  OR DIAGNOSIS REQUIRED)    Answer:   screening    Order Specific Question:   Preferred imaging location?    Answer:   External  . DG Bone Density    Standing Status:   Future    Standing Expiration Date:    01/16/2020    Scheduling Instructions:     [facility]    Order Specific Question:   Reason for Exam (SYMPTOM  OR DIAGNOSIS REQUIRED)    Answer:   screening    Order Specific Question:   Preferred imaging location?    Answer:   External   All questions were answered. The patient knows to call the clinic with any problems, questions or concerns. No barriers to learning was detected. I spent 15 minutes counseling the patient face to face. The total time spent in the appointment was 20 minutes and more than 50% was on counseling and review of test results     Truitt Merle, MD 01/16/2019   I, Joslyn Devon, am acting as scribe for Truitt Merle, MD.   I have reviewed the above documentation for accuracy and completeness, and I agree with the above.

## 2019-01-16 ENCOUNTER — Encounter: Payer: Self-pay | Admitting: Hematology

## 2019-01-16 ENCOUNTER — Inpatient Hospital Stay (HOSPITAL_BASED_OUTPATIENT_CLINIC_OR_DEPARTMENT_OTHER): Payer: PPO | Admitting: Hematology

## 2019-01-16 ENCOUNTER — Inpatient Hospital Stay: Payer: PPO | Attending: Hematology

## 2019-01-16 ENCOUNTER — Telehealth: Payer: Self-pay | Admitting: Hematology

## 2019-01-16 VITALS — BP 148/86 | HR 71 | Temp 98.0°F | Resp 18 | Ht 59.0 in | Wt 169.2 lb

## 2019-01-16 DIAGNOSIS — M8580 Other specified disorders of bone density and structure, unspecified site: Secondary | ICD-10-CM

## 2019-01-16 DIAGNOSIS — I10 Essential (primary) hypertension: Secondary | ICD-10-CM | POA: Insufficient documentation

## 2019-01-16 DIAGNOSIS — Z923 Personal history of irradiation: Secondary | ICD-10-CM | POA: Insufficient documentation

## 2019-01-16 DIAGNOSIS — M1990 Unspecified osteoarthritis, unspecified site: Secondary | ICD-10-CM | POA: Diagnosis not present

## 2019-01-16 DIAGNOSIS — Z79899 Other long term (current) drug therapy: Secondary | ICD-10-CM | POA: Diagnosis not present

## 2019-01-16 DIAGNOSIS — E039 Hypothyroidism, unspecified: Secondary | ICD-10-CM

## 2019-01-16 DIAGNOSIS — C50412 Malignant neoplasm of upper-outer quadrant of left female breast: Secondary | ICD-10-CM

## 2019-01-16 DIAGNOSIS — Z170 Estrogen receptor positive status [ER+]: Secondary | ICD-10-CM

## 2019-01-16 DIAGNOSIS — Z791 Long term (current) use of non-steroidal anti-inflammatories (NSAID): Secondary | ICD-10-CM

## 2019-01-16 DIAGNOSIS — E2839 Other primary ovarian failure: Secondary | ICD-10-CM

## 2019-01-16 LAB — CBC WITH DIFFERENTIAL/PLATELET
Abs Immature Granulocytes: 0.02 10*3/uL (ref 0.00–0.07)
BASOS ABS: 0 10*3/uL (ref 0.0–0.1)
Basophils Relative: 0 %
Eosinophils Absolute: 0.1 10*3/uL (ref 0.0–0.5)
Eosinophils Relative: 1 %
HEMATOCRIT: 41.2 % (ref 36.0–46.0)
Hemoglobin: 14.4 g/dL (ref 12.0–15.0)
IMMATURE GRANULOCYTES: 0 %
LYMPHS ABS: 1.6 10*3/uL (ref 0.7–4.0)
LYMPHS PCT: 23 %
MCH: 29.8 pg (ref 26.0–34.0)
MCHC: 35 g/dL (ref 30.0–36.0)
MCV: 85.3 fL (ref 80.0–100.0)
Monocytes Absolute: 0.4 10*3/uL (ref 0.1–1.0)
Monocytes Relative: 5 %
NEUTROS PCT: 71 %
Neutro Abs: 5 10*3/uL (ref 1.7–7.7)
Platelets: 366 10*3/uL (ref 150–400)
RBC: 4.83 MIL/uL (ref 3.87–5.11)
RDW: 13.8 % (ref 11.5–15.5)
WBC: 7.1 10*3/uL (ref 4.0–10.5)
nRBC: 0 % (ref 0.0–0.2)

## 2019-01-16 LAB — COMPREHENSIVE METABOLIC PANEL
ALT: 16 U/L (ref 0–44)
ANION GAP: 12 (ref 5–15)
AST: 26 U/L (ref 15–41)
Albumin: 4.1 g/dL (ref 3.5–5.0)
Alkaline Phosphatase: 81 U/L (ref 38–126)
BUN: 10 mg/dL (ref 8–23)
CHLORIDE: 100 mmol/L (ref 98–111)
CO2: 29 mmol/L (ref 22–32)
Calcium: 10.2 mg/dL (ref 8.9–10.3)
Creatinine, Ser: 0.92 mg/dL (ref 0.44–1.00)
GFR calc Af Amer: >60 mL/min (ref >60)
GFR calc non Af Amer: >60 mL/min (ref >60)
Glucose, Bld: 96 mg/dL (ref 70–99)
Potassium: 3.8 mmol/L (ref 3.5–5.1)
SODIUM: 141 mmol/L (ref 135–145)
Total Bilirubin: 0.9 mg/dL (ref 0.3–1.2)
Total Protein: 7.5 g/dL (ref 6.5–8.1)

## 2019-01-16 NOTE — Telephone Encounter (Signed)
Gave avs and calendar ° °

## 2019-01-22 DIAGNOSIS — H26491 Other secondary cataract, right eye: Secondary | ICD-10-CM | POA: Diagnosis not present

## 2019-02-10 DIAGNOSIS — Z981 Arthrodesis status: Secondary | ICD-10-CM | POA: Diagnosis not present

## 2019-05-12 DIAGNOSIS — N3000 Acute cystitis without hematuria: Secondary | ICD-10-CM | POA: Diagnosis not present

## 2019-05-27 DIAGNOSIS — H353112 Nonexudative age-related macular degeneration, right eye, intermediate dry stage: Secondary | ICD-10-CM | POA: Diagnosis not present

## 2019-05-27 DIAGNOSIS — H524 Presbyopia: Secondary | ICD-10-CM | POA: Diagnosis not present

## 2019-05-27 DIAGNOSIS — H04123 Dry eye syndrome of bilateral lacrimal glands: Secondary | ICD-10-CM | POA: Diagnosis not present

## 2019-06-24 DIAGNOSIS — L989 Disorder of the skin and subcutaneous tissue, unspecified: Secondary | ICD-10-CM | POA: Diagnosis not present

## 2019-06-24 DIAGNOSIS — R1032 Left lower quadrant pain: Secondary | ICD-10-CM | POA: Diagnosis not present

## 2019-06-24 DIAGNOSIS — C50912 Malignant neoplasm of unspecified site of left female breast: Secondary | ICD-10-CM | POA: Diagnosis not present

## 2019-06-26 ENCOUNTER — Other Ambulatory Visit: Payer: Self-pay | Admitting: Surgery

## 2019-06-26 DIAGNOSIS — R1032 Left lower quadrant pain: Secondary | ICD-10-CM

## 2019-06-29 ENCOUNTER — Other Ambulatory Visit: Payer: Self-pay | Admitting: Surgery

## 2019-06-29 ENCOUNTER — Other Ambulatory Visit (HOSPITAL_COMMUNITY): Payer: Self-pay | Admitting: Surgery

## 2019-06-29 DIAGNOSIS — D23122 Other benign neoplasm of skin of left lower eyelid, including canthus: Secondary | ICD-10-CM | POA: Diagnosis not present

## 2019-06-29 DIAGNOSIS — D23112 Other benign neoplasm of skin of right lower eyelid, including canthus: Secondary | ICD-10-CM | POA: Diagnosis not present

## 2019-06-29 DIAGNOSIS — R1032 Left lower quadrant pain: Secondary | ICD-10-CM

## 2019-07-06 ENCOUNTER — Ambulatory Visit (HOSPITAL_COMMUNITY)
Admission: RE | Admit: 2019-07-06 | Discharge: 2019-07-06 | Disposition: A | Discharge status: Home / Self Care | Payer: PPO | Source: Ambulatory Visit | Attending: Surgery | Admitting: Surgery

## 2019-07-06 ENCOUNTER — Other Ambulatory Visit: Payer: Self-pay

## 2019-07-06 DIAGNOSIS — R1032 Left lower quadrant pain: Secondary | ICD-10-CM | POA: Diagnosis not present

## 2019-07-06 DIAGNOSIS — R109 Unspecified abdominal pain: Secondary | ICD-10-CM | POA: Diagnosis not present

## 2019-07-06 LAB — CREATININE, SERUM
Creatinine, Ser: 0.86 mg/dL (ref 0.44–1.00)
GFR calc Af Amer: >60 mL/min (ref >60)
GFR calc non Af Amer: >60 mL/min (ref >60)

## 2019-07-06 MED ORDER — IOHEXOL 300 MG/ML  SOLN
100.0000 mL | Freq: Once | INTRAMUSCULAR | Status: AC | PRN
  Administered 2019-07-06: 17:00:00 100 mL via INTRAVENOUS

## 2019-07-09 ENCOUNTER — Ambulatory Visit (INDEPENDENT_AMBULATORY_CARE_PROVIDER_SITE_OTHER): Payer: PPO | Admitting: Adult Health

## 2019-07-09 ENCOUNTER — Other Ambulatory Visit: Payer: Self-pay

## 2019-07-09 ENCOUNTER — Encounter: Payer: Self-pay | Admitting: Adult Health

## 2019-07-09 VITALS — BP 120/81 | HR 69 | Temp 98.0°F | Ht 59.0 in | Wt 175.8 lb

## 2019-07-09 DIAGNOSIS — G609 Hereditary and idiopathic neuropathy, unspecified: Secondary | ICD-10-CM | POA: Diagnosis not present

## 2019-07-09 MED ORDER — GABAPENTIN 300 MG PO CAPS: ORAL_CAPSULE | ORAL | 3 refills | Status: DC

## 2019-07-09 NOTE — Patient Instructions (Signed)
Your Plan:  Continue gabapentin If your symptoms worsen or you develop new symptoms please let us know.   Thank you for coming to see [place] at [facility]. I hope we have been able to provide you high quality care today.  You may receive a patient satisfaction survey over the next few weeks. We would appreciate your feedback and comments so that we may continue to improve ourselves and the health of our patients. Husband does not drink he had some alcohol.  I like that man sweetie is my baby had this abdominal mass

## 2019-07-09 NOTE — Progress Notes (Signed)
PATIENT: Audrey [name] DOB: 1941-07-11  REASON FOR VISIT: follow up HISTORY FROM: patient  HISTORY OF PRESENT ILLNESS: Today 07/09/19:  Audrey [name] is a 78 year old female with a history of peripheral neuropathy.  She returns today for follow-up.  She continues to take gabapentin 300 mg in the morning and at noon and 600 mg at bedtime.  She states that this continues to work well for her.  Over the last 6 months she has had 3 episodes where she will have a exacerbation of pain in her feet.  She describes it as a sharp shooting pain that can last for a while.  She states when this does happen she will take an extra gabapentin and it resolves the pain.  She denies any significant changes with her gait or balance.  She states that her balance is not as good as it was because she is not able to go to yoga class to the pandemic.  She denies any new symptoms.  She returns today for an evaluation.  HISTORY Audrey [name] is a 78 year old female with a history of peripheral neuropathy.  She returns today for follow-up.  She is currently taking gabapentin 300 mg in the morning and at noon and 600 mg at bedtime.  She states on occasion she will forget the lunchtime dose.  She states for the most part gabapentin controls her neuropathy discomfort.  She denies any pain.  Reports that her her gait and balance has remained relatively stable.  Reports that she had back surgery in September.  She denies any falls.  She does not use a cane or a walker.  She does do yoga and feels it is helpful.  She states that her feet feel cold but not to touch.  She states that she has trouble falling asleep.  She states that it can sometimes take several hours for her to go to sleep.  She is not taking anything for her sleep and has never tried any over-the-counter medication.  REVIEW OF SYSTEMS: Out of a complete 14 system review of symptoms, the patient complains only of the following symptoms, and all other reviewed systems  are negative.  See HPI  ALLERGIES: Allergies  Allergen Reactions   Contrast Media [Iodinated Diagnostic Agents] Shortness Of Breath   Other Swelling    Cat gut sutures Cats and dogs                Derma bond- intolerance- itching- tolerates with Benadryl       Cymbalta [Duloxetine Hcl]     Diarrhea   Macrodantin [Nitrofurantoin] Rash    HOME MEDICATIONS: Outpatient Medications Prior to Visit  Medication Sig Dispense Refill   Calcium Carb-Cholecalciferol (CALCIUM + D3) 600-200 MG-UNIT TABS Take 1 each by mouth daily.      Cholecalciferol (VITAMIN D3 PO) Take 2,000 Units by mouth daily.     Co-Enzyme Q10 200 MG CAPS Take 1 capsule by mouth daily.      CRANBERRY PO Take 2 tablets by mouth daily.     Cyanocobalamin (VITAMIN B-12) 5000 MCG SUBL Place 1 tablet under the tongue daily.     cycloSPORINE (RESTASIS) 0.05 % ophthalmic emulsion Place 1 drop into both eyes 2 (two) times daily.     esomeprazole (NEXIUM) 20 MG packet Take 20 mg by mouth daily before breakfast.     gabapentin (NEURONTIN) 300 MG capsule ONE CAPSULE TWICE DURING THE DAY AND 2 AT NIGHT 360 capsule 3   Glucos-Chondroit-Hyaluron-MSM (GLUCOSAMINE  CHONDROITIN JOINT PO) Take 1 tablet by mouth daily.     hydrochlorothiazide (MICROZIDE) 12.5 MG capsule Take 1 capsule (12.5 mg total) by mouth daily. 90 capsule 1   levothyroxine (SYNTHROID, LEVOTHROID) 25 MCG tablet Take 25 mcg by mouth 4 (four) times a week.     loratadine (CLARITIN) 10 MG tablet Take 10 mg by mouth daily as needed. Reported on 05/03/2016     Multiple Vitamins-Minerals (PRESERVISION AREDS 2) CAPS Take 1 tablet by mouth 2 (two) times daily.      naproxen sodium (ALEVE) 220 MG tablet Take 220 mg by mouth daily.     nebivolol (BYSTOLIC) 5 MG tablet Take 5 mg by mouth daily.     pyridOXINE (VITAMIN B-6) 100 MG tablet Take 100 mg by mouth daily.     Propylene Glycol (SYSTANE BALANCE) 0.6 % SOLN Place 1 drop into both eyes every morning.      calcium carbonate (TUMS - DOSED IN MG ELEMENTAL CALCIUM) 500 MG chewable tablet Chew 1 tablet by mouth daily as needed for indigestion.      Cranberry 200 MG CAPS Take 1 capsule by mouth daily.      meloxicam (MOBIC) 15 MG tablet Take 15 mg by mouth as needed for pain.     No facility-administered medications prior to visit.     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   Hypertension    Hypothyroidism    Obesity    PONV (postoperative nausea and vomiting)    Reflux    buring and dysphagia sx resp to PPI; egd 3/06 neg for Barretts   Tachycardia     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   Yeast infection    hx of     PAST SURGICAL HISTORY: Past Surgical History:  Procedure Laterality Date   AXILLARY LYMPH NODE BIOPSY Left 12/27/2015   Procedure: LEFT AXILLARY SENTINEL LYMPH NODE BIOPSY;  Surgeon: [name], MD;  Location: [place];  Service: General;  Laterality: Left;   BELPHAROPTOSIS REPAIR Bilateral    BREAST LUMPECTOMY WITH RADIOACTIVE SEED LOCALIZATION Left 12/01/2015   Procedure: LEFT BREAST [place] RADIOACTIVE SEED LOCALIZATION;  Surgeon: [name], MD;  Location: [place];  Service: General;  Laterality: Left;   CATARACT EXTRACTION, BILATERAL Bilateral 08/2018   COLONOSCOPY  3/06   NEG   cortisone injections     right arm; back   EYE SURGERY     cosmetic; eyelid surgery bilat    FINGER SURGERY     left index finger/ cyst 02/2007   KNEE ARTHROSCOPY Right 04/13/2015   Procedure: ARTHROSCOPY  RIGHT KNEE WITH MEDIAL MENISCAL DEBRIDEMENT AND CHONDROPLASTY;  Surgeon: Gaynelle Arabian, MD;  Location: [facility];  Service: Orthopedics;  Laterality: Right;   left rotator cuff repair     12/2006   LUMBAR LAMINECTOMY     TONSILLECTOMY      FAMILY HISTORY: Family History  Problem Relation Age of  Onset   Diabetes Mother    Breast cancer Mother        dx. late 65s-early 16s; "metastasis to throat"?   Hodgkin's lymphoma Father 22   Breast cancer Sister        maternal half-sister dx. w/ DCIS in her late 1s;   Other Sister        learning disabilities; lives on her own   Diabetes Sister    Breast cancer Maternal Aunt        dx. 40s   Ovarian cancer Maternal Aunt 59   Breast cancer Paternal Aunt        dx. early 53s   Stroke Maternal Grandfather    Depression Paternal Grandmother    Heart Problems Paternal Grandmother    Alcohol abuse Brother    Skin cancer Daughter        dx. early 52s   Other Daughter        insterstitial cystitis   Heart Problems Paternal Aunt     SOCIAL HISTORY: Social History   Socioeconomic History   Marital status: Married    Spouse name: Not on file   Number of children: 2   Years of education: 13   Highest education level: Not on file  Occupational History   Occupation: retired  Scientist, product/process development strain: Not on file   Food insecurity    Worry: Not on file    Inability: Not on Lexicographer needs    Medical: Not on file    Non-medical: Not on file  Tobacco Use   Smoking status: Never Smoker   Smokeless tobacco: Never Used  Substance and Sexual Activity   Alcohol use: No   Drug use: No   Sexual activity: Not on file  Lifestyle   Physical activity    Days per week: Not on file    Minutes per session: Not on file   Stress: Not on file  Relationships   Social connections    Talks on phone: Not on file    Gets together: Not on file    Attends religious service: Not on file     Active member of club or organization: Not on file    Attends meetings of clubs or organizations: Not on file    Relationship status: Not on file   Intimate partner violence    Fear of current or ex partner: Not on file    Emotionally abused: Not on file    Physically abused: Not on file    Forced sexual activity: Not on file  Other Topics Concern   Not on file  Social History Narrative   Patient is right handed.   Patient drinks 2-3 cups of caffeine per day.      PHYSICAL EXAM  Vitals:   07/09/19 1428  Temp: 98 F (36.7 C)  TempSrc: Oral  Weight: 175 lb 12.8 oz (79.7 kg)  Height: 4\' 11"  (1.499 m)   Body mass index is 35.51 kg/m.  Generalized: Well developed, in no acute distress   Neurological examination  Mentation: Alert oriented to time, place, history taking. Follows all commands speech and language fluent Cranial nerve II-XII:Extraocular movements were full, visual field were full on confrontational test. Head turning and shoulder shrug  were normal and symmetric. Motor: The motor testing reveals 5 over 5 strength of all 4 extremities. Good symmetric motor tone is noted throughout.  Sensory: Sensory testing is intact to soft touch on all 4  extremities. No evidence of extinction is noted.  Coordination: Cerebellar testing reveals good finger-nose-finger and heel-to-shin bilaterally.  Gait and station: Gait is normal. Tandem gait is normal. Romberg is negative. No drift is seen.  Reflexes: Deep tendon reflexes are symmetric and normal bilaterally.   DIAGNOSTIC DATA (LABS, IMAGING, TESTING) - I reviewed patient records, labs, notes, testing and imaging myself where available.  Lab Results  Component Value Date   WBC 7.1 01/16/2019   HGB 14.4 01/16/2019   HCT 41.2 01/16/2019   MCV 85.3 01/16/2019   PLT 366 01/16/2019      Component Value Date/Time   NA 141 01/16/2019 1232   NA 141 11/30/2016 1358   K 3.8 01/16/2019 1232   K 3.5 11/30/2016 1358   CL 100  01/16/2019 1232   CO2 29 01/16/2019 1232   CO2 29 11/30/2016 1358   GLUCOSE 96 01/16/2019 1232   GLUCOSE 103 11/30/2016 1358   BUN 10 01/16/2019 1232   BUN 14.2 11/30/2016 1358   CREATININE 0.86 07/06/2019 1625   CREATININE 0.8 11/30/2016 1358   CALCIUM 10.2 01/16/2019 1232   CALCIUM 9.5 11/30/2016 1358   PROT 7.5 01/16/2019 1232   PROT 7.1 11/30/2016 1358   ALBUMIN 4.1 01/16/2019 1232   ALBUMIN 4.0 11/30/2016 1358   AST 26 01/16/2019 1232   AST 25 11/30/2016 1358   ALT 16 01/16/2019 1232   ALT 20 11/30/2016 1358   ALKPHOS 81 01/16/2019 1232   ALKPHOS 71 11/30/2016 1358   BILITOT 0.9 01/16/2019 1232   BILITOT 0.55 11/30/2016 1358   GFRNONAA >60 07/06/2019 1625   GFRAA >60 07/06/2019 1625   No results found for: CHOL, HDL, LDLCALC, LDLDIRECT, TRIG, CHOLHDL No results found for: HGBA1C Lab Results  Component Value Date   VITAMINB12 >2000 (H) 02/02/2015   No results found for: TSH    ASSESSMENT AND PLAN 78 y.o. year old female  has a past medical history of Allergy, Arthritis, Bladder infection, Breast cancer ([place]) (10/25/15), Cancer ([place]), Complication of anesthesia, Diastolic dysfunction, Dyspnea, GERD (gastroesophageal reflux disease), Hereditary and idiopathic peripheral neuropathy (02/10/2015), Hypertension, Hypothyroidism, Obesity, PONV (postoperative nausea and vomiting), Reflux, Tachycardia ( ), and Yeast infection. here with:  1.  Peripheral neuropathy  Overall the patient is doing well.  She will continue on gabapentin.  She is advised that if her symptoms worsen or she develops new symptoms she should let us know.  She will follow-up in 6 months or sooner if needed.   I spent 15 minutes with the patient. 50% of this time was spent reviewing her plan of care   Ward Givens, MSN, NP-C 07/09/2019, 2:38 PM [facility] [address] [place], [place] [address] [phone]

## 2019-07-11 NOTE — Progress Notes (Signed)
I have read the note, and I agree with the clinical assessment and plan.  Giani Betzold K Rielynn Trulson   

## 2019-07-19 DIAGNOSIS — M25559 Pain in unspecified hip: Secondary | ICD-10-CM | POA: Diagnosis not present

## 2019-07-19 DIAGNOSIS — M5416 Radiculopathy, lumbar region: Secondary | ICD-10-CM | POA: Diagnosis not present

## 2019-07-28 DIAGNOSIS — R1032 Left lower quadrant pain: Secondary | ICD-10-CM | POA: Diagnosis not present

## 2019-08-07 DIAGNOSIS — R319 Hematuria, unspecified: Secondary | ICD-10-CM | POA: Diagnosis not present

## 2019-08-07 DIAGNOSIS — N3090 Cystitis, unspecified without hematuria: Secondary | ICD-10-CM | POA: Diagnosis not present

## 2019-08-13 DIAGNOSIS — E039 Hypothyroidism, unspecified: Secondary | ICD-10-CM | POA: Diagnosis not present

## 2019-08-13 DIAGNOSIS — I10 Essential (primary) hypertension: Secondary | ICD-10-CM | POA: Diagnosis not present

## 2019-08-13 DIAGNOSIS — I471 Supraventricular tachycardia: Secondary | ICD-10-CM | POA: Diagnosis not present

## 2019-08-13 DIAGNOSIS — E7800 Pure hypercholesterolemia, unspecified: Secondary | ICD-10-CM | POA: Diagnosis not present

## 2019-08-13 DIAGNOSIS — Z23 Encounter for immunization: Secondary | ICD-10-CM | POA: Diagnosis not present

## 2019-08-13 DIAGNOSIS — Z0000 Encounter for general adult medical examination without abnormal findings: Secondary | ICD-10-CM | POA: Diagnosis not present

## 2019-08-13 DIAGNOSIS — E559 Vitamin D deficiency, unspecified: Secondary | ICD-10-CM | POA: Diagnosis not present

## 2019-08-13 DIAGNOSIS — K219 Gastro-esophageal reflux disease without esophagitis: Secondary | ICD-10-CM | POA: Diagnosis not present

## 2019-08-13 DIAGNOSIS — Z1389 Encounter for screening for other disorder: Secondary | ICD-10-CM | POA: Diagnosis not present

## 2019-08-13 DIAGNOSIS — I700 Atherosclerosis of aorta: Secondary | ICD-10-CM | POA: Diagnosis not present

## 2019-08-13 DIAGNOSIS — Z853 Personal history of malignant neoplasm of breast: Secondary | ICD-10-CM | POA: Diagnosis not present

## 2019-08-13 DIAGNOSIS — N3090 Cystitis, unspecified without hematuria: Secondary | ICD-10-CM | POA: Diagnosis not present

## 2019-09-01 DIAGNOSIS — Z853 Personal history of malignant neoplasm of breast: Secondary | ICD-10-CM | POA: Diagnosis not present

## 2019-09-01 DIAGNOSIS — Z8262 Family history of osteoporosis: Secondary | ICD-10-CM | POA: Diagnosis not present

## 2019-09-01 DIAGNOSIS — Z981 Arthrodesis status: Secondary | ICD-10-CM | POA: Diagnosis not present

## 2019-09-01 DIAGNOSIS — M8589 Other specified disorders of bone density and structure, multiple sites: Secondary | ICD-10-CM | POA: Diagnosis not present

## 2019-09-01 DIAGNOSIS — R29890 Loss of height: Secondary | ICD-10-CM | POA: Diagnosis not present

## 2019-09-03 DIAGNOSIS — M25559 Pain in unspecified hip: Secondary | ICD-10-CM | POA: Diagnosis not present

## 2019-09-03 DIAGNOSIS — M5416 Radiculopathy, lumbar region: Secondary | ICD-10-CM | POA: Diagnosis not present

## 2019-09-08 DIAGNOSIS — E876 Hypokalemia: Secondary | ICD-10-CM | POA: Diagnosis not present

## 2019-09-10 DIAGNOSIS — R350 Frequency of micturition: Secondary | ICD-10-CM | POA: Diagnosis not present

## 2019-09-10 DIAGNOSIS — N393 Stress incontinence (female) (male): Secondary | ICD-10-CM | POA: Diagnosis not present

## 2019-09-10 DIAGNOSIS — Z01419 Encounter for gynecological examination (general) (routine) without abnormal findings: Secondary | ICD-10-CM | POA: Diagnosis not present

## 2019-09-10 DIAGNOSIS — Z87440 Personal history of urinary (tract) infections: Secondary | ICD-10-CM | POA: Diagnosis not present

## 2019-09-11 IMAGING — CR DG LUMBAR SPINE COMPLETE 4+V
4 series · 4 of 4 positions shown · non-contrast
Comparison: MRI 01/09/2018

CLINICAL DATA: Localization for surgery.

EXAM:
LUMBAR SPINE - COMPLETE 4+ VIEW

[lateral (1 of 4)]
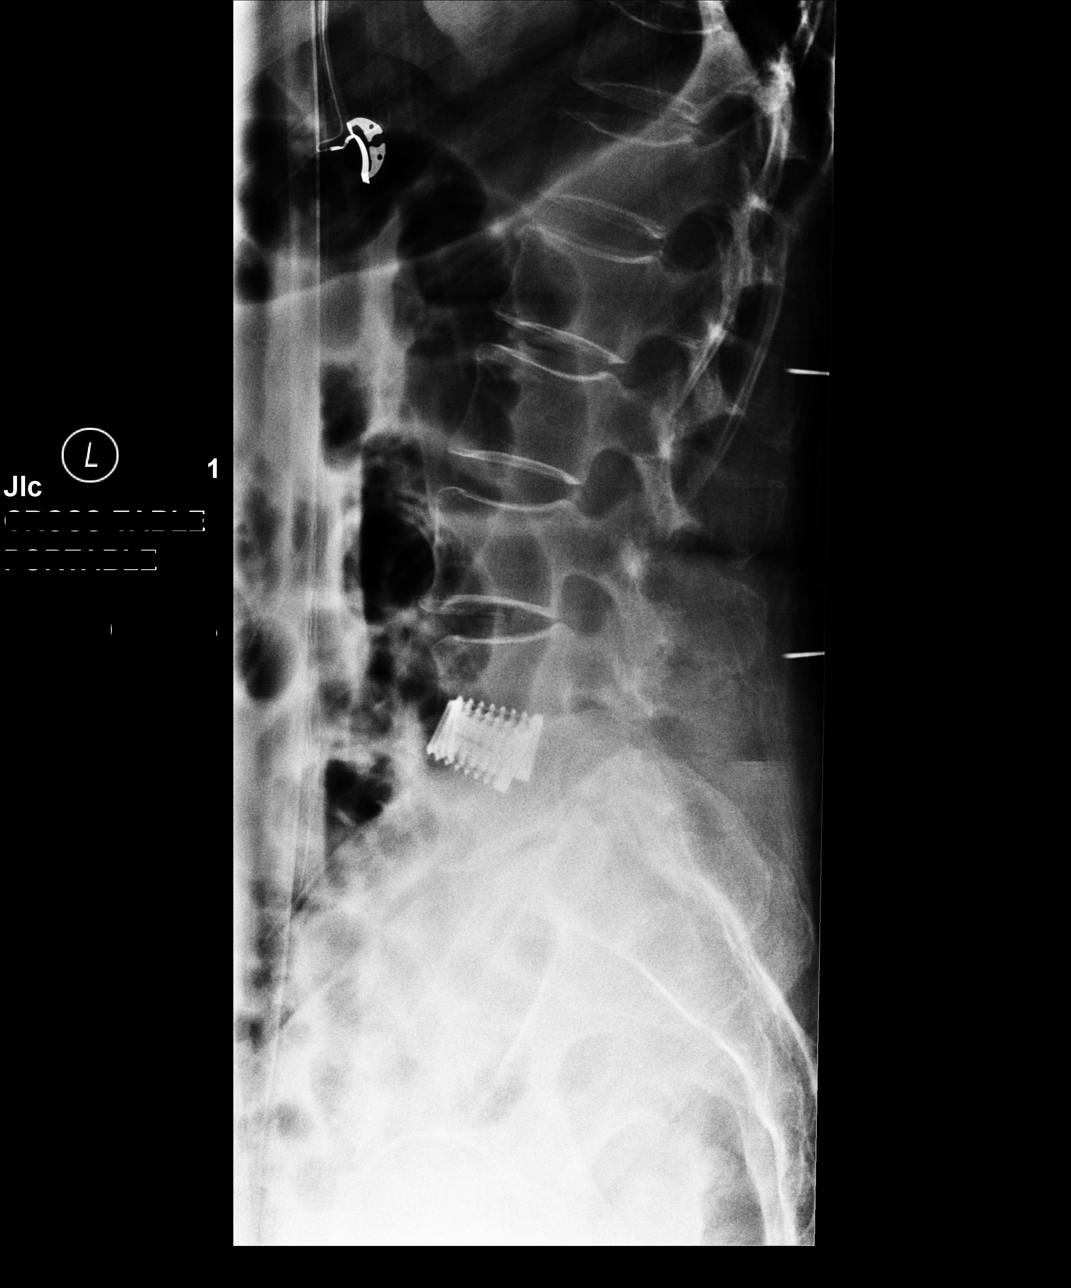

[lateral (2 of 4)]
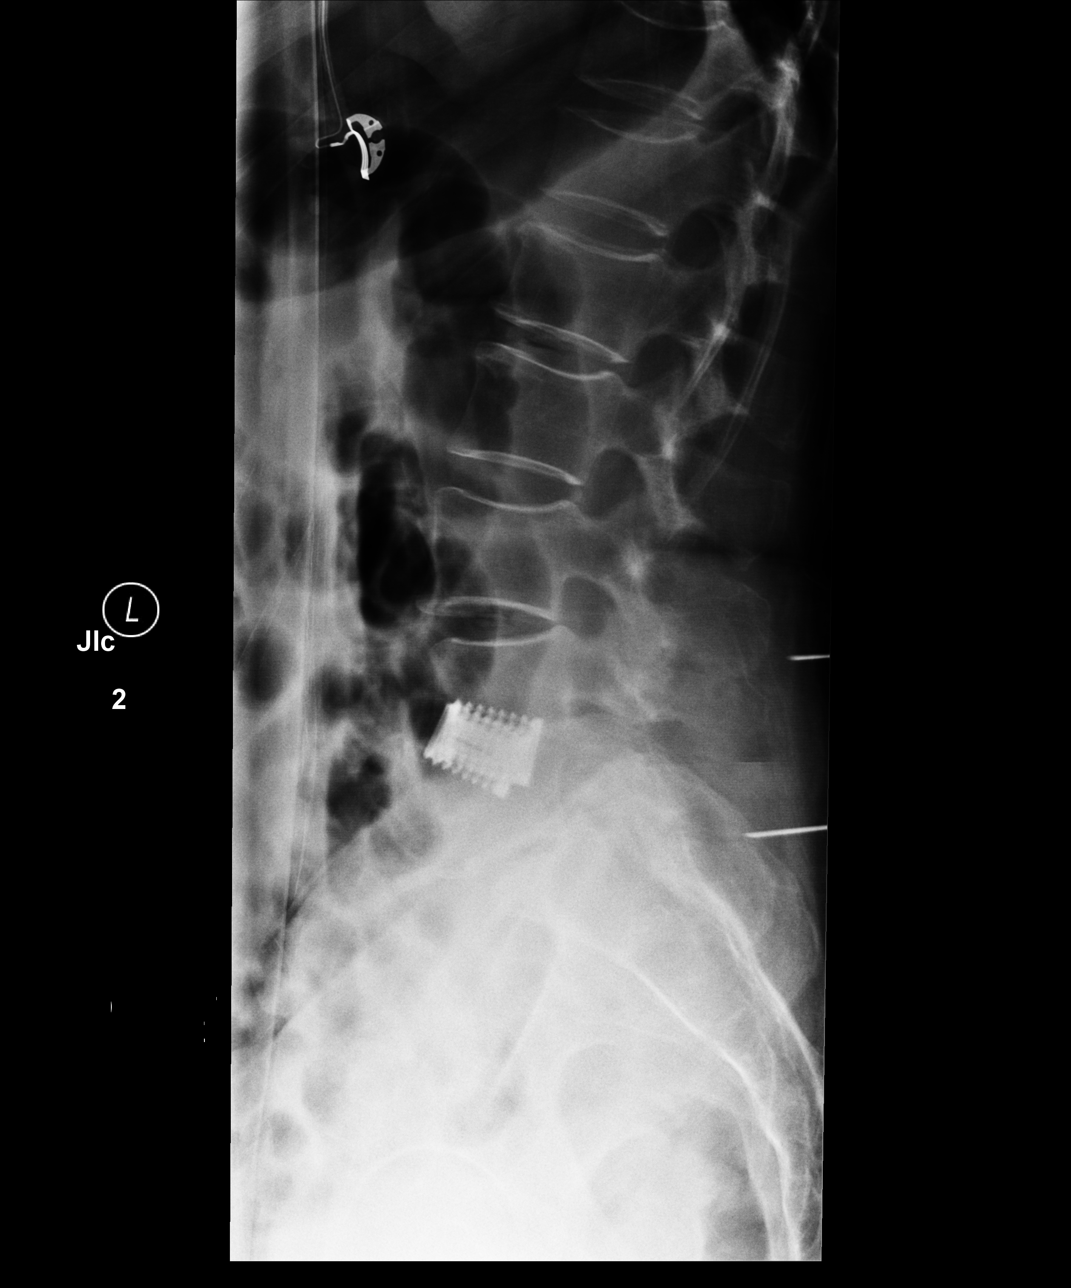

[lateral (3 of 4)]
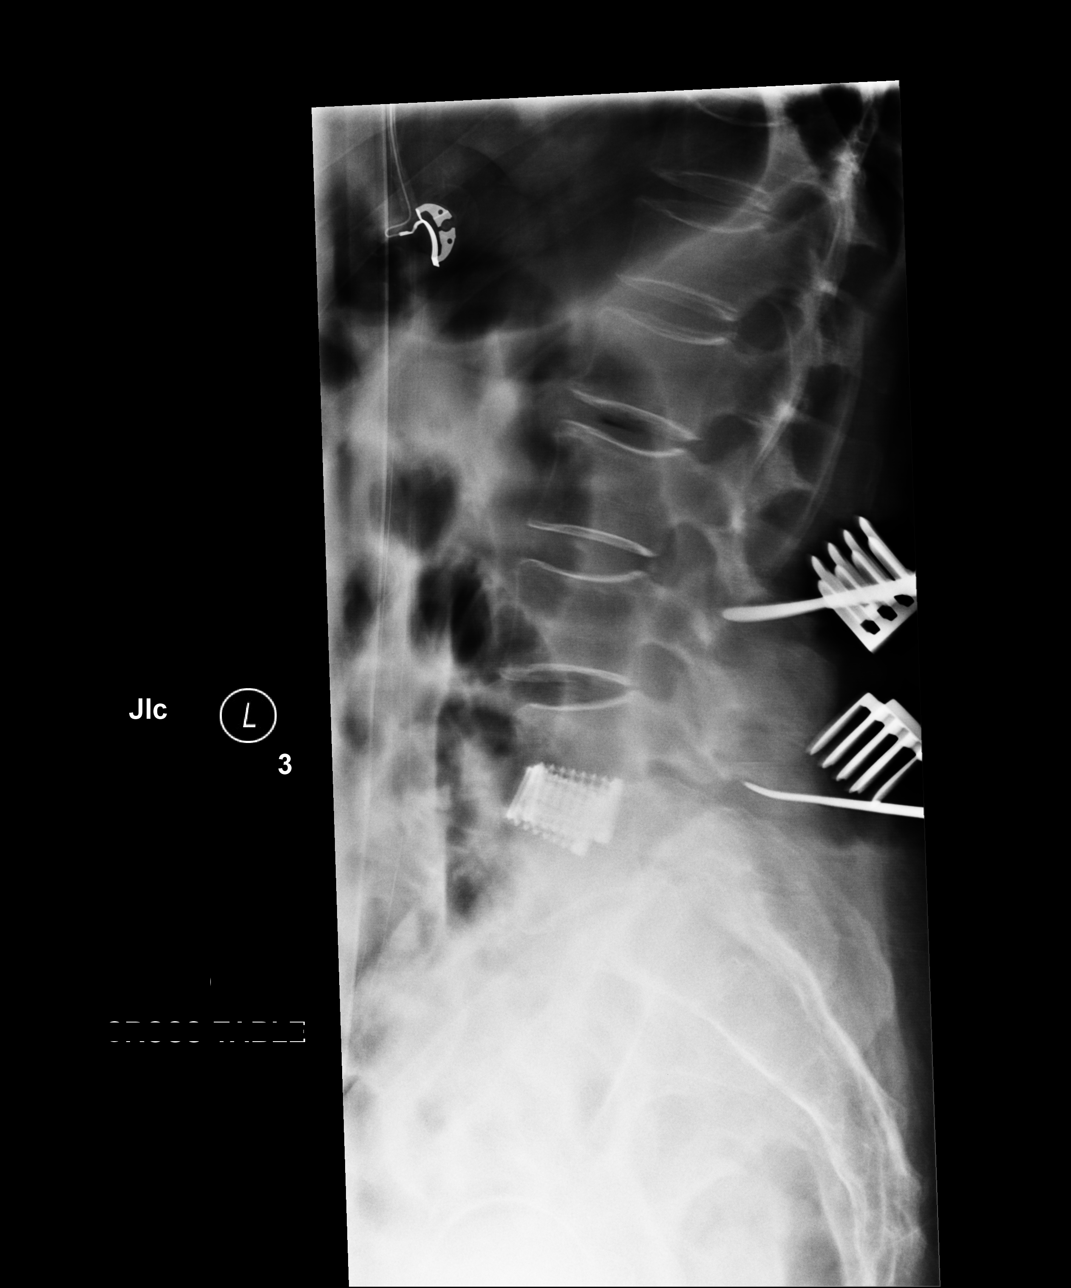

[lateral (4 of 4)]
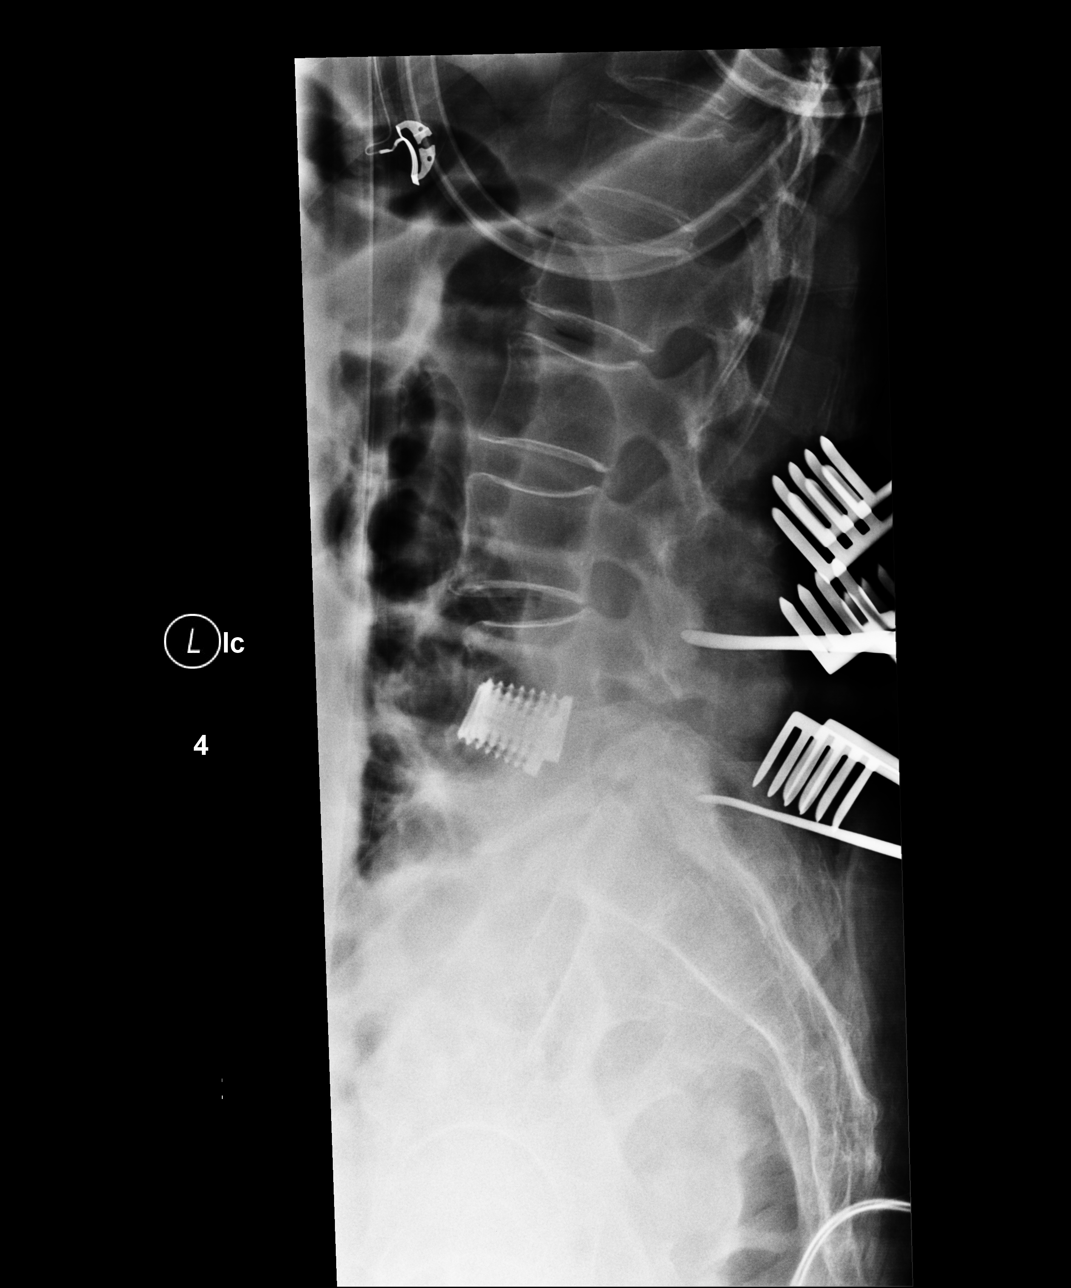

[4 of 4 positions shown; findings below may reference images not displayed]

FINDINGS: Cross-table portable views of the lumbar spine were obtained. Again
noted is an interbody device at L4-L5 and disc space narrowing at
L5-S1. The first image has localization needles along the L1 spinous
process and between the L3 and L4 spinous processes. The second
image has localization needles at spinous processes of L3-L4 and at
L5-S1. Third image has localization needles along the posterior
aspect of L3 and L4-L5. The fourth localization image has needles at
the level of L4 and L5-S1.
IMPRESSION: Needle localization for surgery.

## 2019-10-14 DIAGNOSIS — N3946 Mixed incontinence: Secondary | ICD-10-CM | POA: Diagnosis not present

## 2019-11-02 DIAGNOSIS — Z981 Arthrodesis status: Secondary | ICD-10-CM | POA: Diagnosis not present

## 2019-11-02 DIAGNOSIS — M48062 Spinal stenosis, lumbar region with neurogenic claudication: Secondary | ICD-10-CM | POA: Diagnosis not present

## 2019-11-02 DIAGNOSIS — M47816 Spondylosis without myelopathy or radiculopathy, lumbar region: Secondary | ICD-10-CM | POA: Diagnosis not present

## 2019-11-02 DIAGNOSIS — M5136 Other intervertebral disc degeneration, lumbar region: Secondary | ICD-10-CM | POA: Diagnosis not present

## 2019-11-02 DIAGNOSIS — M5416 Radiculopathy, lumbar region: Secondary | ICD-10-CM | POA: Diagnosis not present

## 2019-11-05 DIAGNOSIS — M48062 Spinal stenosis, lumbar region with neurogenic claudication: Secondary | ICD-10-CM | POA: Diagnosis not present

## 2019-11-05 DIAGNOSIS — M47816 Spondylosis without myelopathy or radiculopathy, lumbar region: Secondary | ICD-10-CM | POA: Diagnosis not present

## 2019-11-05 DIAGNOSIS — M48061 Spinal stenosis, lumbar region without neurogenic claudication: Secondary | ICD-10-CM | POA: Diagnosis not present

## 2019-11-05 DIAGNOSIS — M5126 Other intervertebral disc displacement, lumbar region: Secondary | ICD-10-CM | POA: Diagnosis not present

## 2019-11-09 DIAGNOSIS — M5136 Other intervertebral disc degeneration, lumbar region: Secondary | ICD-10-CM | POA: Diagnosis not present

## 2019-11-09 DIAGNOSIS — M5416 Radiculopathy, lumbar region: Secondary | ICD-10-CM | POA: Diagnosis not present

## 2019-11-09 DIAGNOSIS — Z981 Arthrodesis status: Secondary | ICD-10-CM | POA: Diagnosis not present

## 2019-11-09 DIAGNOSIS — M48062 Spinal stenosis, lumbar region with neurogenic claudication: Secondary | ICD-10-CM | POA: Diagnosis not present

## 2019-12-07 DIAGNOSIS — M545 Low back pain: Secondary | ICD-10-CM | POA: Diagnosis not present

## 2019-12-16 DIAGNOSIS — M545 Low back pain: Secondary | ICD-10-CM | POA: Diagnosis not present

## 2019-12-25 DIAGNOSIS — M4726 Other spondylosis with radiculopathy, lumbar region: Secondary | ICD-10-CM | POA: Diagnosis not present

## 2019-12-25 DIAGNOSIS — M48062 Spinal stenosis, lumbar region with neurogenic claudication: Secondary | ICD-10-CM | POA: Diagnosis not present

## 2019-12-25 DIAGNOSIS — M5416 Radiculopathy, lumbar region: Secondary | ICD-10-CM | POA: Diagnosis not present

## 2019-12-25 DIAGNOSIS — M5136 Other intervertebral disc degeneration, lumbar region: Secondary | ICD-10-CM | POA: Diagnosis not present

## 2019-12-28 DIAGNOSIS — M5136 Other intervertebral disc degeneration, lumbar region: Secondary | ICD-10-CM | POA: Diagnosis not present

## 2019-12-28 DIAGNOSIS — M4726 Other spondylosis with radiculopathy, lumbar region: Secondary | ICD-10-CM | POA: Diagnosis not present

## 2019-12-28 DIAGNOSIS — M48061 Spinal stenosis, lumbar region without neurogenic claudication: Secondary | ICD-10-CM | POA: Diagnosis not present

## 2020-01-04 DIAGNOSIS — M545 Low back pain: Secondary | ICD-10-CM | POA: Diagnosis not present

## 2020-01-07 DIAGNOSIS — Z853 Personal history of malignant neoplasm of breast: Secondary | ICD-10-CM | POA: Diagnosis not present

## 2020-01-07 DIAGNOSIS — R928 Other abnormal and inconclusive findings on diagnostic imaging of breast: Secondary | ICD-10-CM | POA: Diagnosis not present

## 2020-01-11 ENCOUNTER — Telehealth: Payer: Self-pay

## 2020-01-11 NOTE — Telephone Encounter (Signed)
VM message from Pt stating she wants to cancel her appointments for 01/18/20 and reschedule. Schedule message sent to cancel appointments and call to reschedule appointments.

## 2020-01-13 ENCOUNTER — Telehealth: Payer: Self-pay | Admitting: Adult Health

## 2020-01-13 DIAGNOSIS — E039 Hypothyroidism, unspecified: Secondary | ICD-10-CM | POA: Diagnosis not present

## 2020-01-13 DIAGNOSIS — I700 Atherosclerosis of aorta: Secondary | ICD-10-CM | POA: Diagnosis not present

## 2020-01-13 DIAGNOSIS — I10 Essential (primary) hypertension: Secondary | ICD-10-CM | POA: Diagnosis not present

## 2020-01-13 DIAGNOSIS — R8290 Unspecified abnormal findings in urine: Secondary | ICD-10-CM | POA: Diagnosis not present

## 2020-01-13 NOTE — Progress Notes (Signed)
Patient left VM stating she would not be able to make appointments on 01/18/20, and wanted them canceled and rescheduled. Appointments canceled and scheduling message sent for patient to be contacted with new date/time.

## 2020-01-13 NOTE — Telephone Encounter (Signed)
Called pt per 2/17 schmessage - left message for pt that infusion time and labs were still time at 10 am- if I reschedule is needed call.

## 2020-01-14 ENCOUNTER — Telehealth: Payer: Self-pay | Admitting: Hematology

## 2020-01-14 NOTE — Telephone Encounter (Signed)
R/s appt per 2/17 sch message - pt is aware of apt d/t

## 2020-01-18 ENCOUNTER — Inpatient Hospital Stay: Payer: PPO

## 2020-01-18 ENCOUNTER — Inpatient Hospital Stay: Payer: PPO | Admitting: Hematology

## 2020-02-02 NOTE — Progress Notes (Signed)
[place]   [phone]) [phone] Fax:(336) [phone]   Clinic Follow up Note   Patient Care Team: Kelton Pillar, MD as PCP - General (Family Medicine) Sueanne Margarita, MD as Consulting Physician (Cardiology) Jodi Marble, MD as Consulting Physician (Otolaryngology) [name], MD as Consulting Physician (General Surgery) Truitt Merle, MD as Consulting Physician (Hematology) Kyung Rudd, MD as Consulting Physician (Radiation Oncology) Sylvan Cheese, NP as Nurse Practitioner (Hematology and Oncology) Aloha Gell, MD as Consulting Physician (Obstetrics and Gynecology)  Date of Service:  02/10/2020  CHIEF COMPLAINT: Follow up left breast cancer  SUMMARY OF ONCOLOGIC HISTORY: Oncology History Overview Note  Breast cancer of upper-outer quadrant of left female breast [facility])   Staging form: Breast, AJCC 7th Edition     Pathologic stage from 11/29/2015: Stage IA (T1c, N0, cM0) - Signed by Truitt Merle, MD on 01/14/2016     Clinical: Stage IA (T1c, N0, M0) - Unsigned      Breast cancer of upper-outer quadrant of left female breast ([place])  07/18/2014 Procedure   Hereditary cancer panel: no deleterious mutation at ATM, BARD1, BRCA1, BRCA2, BRIP1, CDH1, CHEK2, EPCAM, MLH1, MSH2, MSH6, NBN, PALB2, PMS2, PTEN, RAD51C, RAD51D, STK11, and TP53.   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   12/27/2015 Pathology Results   Second surgery with sentinel lymph  node biopsy showed no malignant cells in 5 lymph nodes   01/23/2016 - 02/17/2016 Radiation Therapy   Adjuvant left Breast radiation: Left breast treated to 42.5 Gy in 17 fractions, Left breast boost treated to 7.5 Gy in 3 fractions     Anti-estrogen oral therapy   She declined adjuvant AI or tamoxifen    05/03/2016 Survivorship   SCP visit completed   10/31/2017 Mammogram   IMPRESSION: There is no mammographic evidence of malignancy.       CURRENT THERAPY:  Surveillance  INTERVAL HISTORY:  Audrey [name] is here for a follow up of left breast cancer. She was last seen by me 1 year ago. She presents to the clinic alone. She notes she is doing well. She notes she received both her COVID19 vaccinations. She notes she has been feeling sad for the past 2-4 weeks. She notes she can cry easily. She denies any obvious stress except, she notes her husband has been having increased memory loss. She feels her mood is related to her body feeling off. She notes she plans to see her PCP this Friday, but is not interested in Medications. She notes she has recurrent itching of her breasts. She notes she had a rash there before. I reviewed her medication list.     REVIEW OF SYSTEMS:   Constitutional: Denies fevers, chills or abnormal weight loss Eyes: Denies blurriness of vision Ears, nose, mouth, throat, and face: Denies mucositis or sore throat Respiratory: Denies cough, dyspnea or wheezes Cardiovascular: Denies palpitation, chest discomfort or lower extremity swelling Gastrointestinal:  Denies nausea, heartburn or change in bowel habits Skin: Denies abnormal  skin rashes (+) Fungal infection under left breast and lower abdomen skin fold.  Lymphatics: Denies new lymphadenopathy or easy bruising Neurological:Denies numbness, tingling or new weaknesses Behavioral/Psych: Mood is stable, no new changes (+) Mood has been down, easy to cry  Breast: (+) Right breast itching  All other systems were  reviewed with the patient and are negative.  MEDICAL HISTORY:  Past Medical History:  Diagnosis Date  . Allergy   . Arthritis   . Bladder infection    hx of last one approx 5 years ago   . Breast cancer ([place]) 10/25/15   left breast  ADH   . Cancer ([place])    breast  . Complication of anesthesia    difficulty awakening   . Diastolic dysfunction   . Dyspnea    due to pain  . GERD (gastroesophageal reflux disease)    Tums if needed  . 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 Hypertension   . Hypothyroidism   . Obesity   . PONV (postoperative nausea and vomiting)   . Reflux    buring and dysphagia sx resp to PPI; egd 3/06 neg for Barretts  . 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 Yeast infection    hx of     SURGICAL HISTORY: Past Surgical History:  Procedure Laterality Date  . AXILLARY LYMPH NODE BIOPSY Left 12/27/2015   Procedure: LEFT AXILLARY SENTINEL LYMPH NODE BIOPSY;  Surgeon: [name], MD;  Location: [facility];  Service: General;  Laterality: Left;  . BELPHAROPTOSIS REPAIR Bilateral   . BREAST LUMPECTOMY WITH RADIOACTIVE SEED LOCALIZATION Left 12/01/2015   Procedure: LEFT BREAST [place] RADIOACTIVE SEED LOCALIZATION;  Surgeon: [name], MD;  Location: [place];  Service: General;  Laterality: Left;  . CATARACT EXTRACTION, BILATERAL Bilateral 08/2018  . COLONOSCOPY  3/06   NEG  . cortisone injections     right arm; back  . EYE SURGERY     cosmetic; eyelid surgery bilat   . FINGER SURGERY     left index finger/ cyst 02/2007  . KNEE ARTHROSCOPY Right 04/13/2015   Procedure: ARTHROSCOPY RIGHT KNEE WITH MEDIAL MENISCAL DEBRIDEMENT AND CHONDROPLASTY;  Surgeon: Gaynelle Arabian, MD;  Location: [facility];  Service: Orthopedics;  Laterality: Right;  . left rotator cuff repair     12/2006  . LUMBAR LAMINECTOMY    . TONSILLECTOMY      I have reviewed the social history and family history  with the patient and they are unchanged from previous note.  ALLERGIES:  is allergic to contrast media [iodinated diagnostic agents]; other; cymbalta [duloxetine hcl]; and macrodantin [nitrofurantoin].  MEDICATIONS:  Current Outpatient Medications  Medication Sig Dispense Refill  . magnesium oxide (MAG-OX) 400 MG tablet Take 200 mg by mouth daily.    . Omega 3 1200 MG CAPS Take 1,280 mg by mouth 2 (two) times daily.    . Calcium Carb-Cholecalciferol (CALCIUM + D3) 600-200 MG-UNIT TABS Take 1 each by mouth daily.     . Cholecalciferol (VITAMIN D3 PO) Take 2,000 Units by mouth daily.    [place] Kitchen Co-Enzyme Q10 200 MG CAPS Take 1 capsule by mouth daily.     [place] Kitchen CRANBERRY PO Take 2 tablets by mouth daily.    . Cyanocobalamin (VITAMIN B-12) 5000 MCG SUBL Place 1 tablet under the tongue daily.    . cycloSPORINE (RESTASIS) 0.05 % ophthalmic emulsion Place 1 drop into both eyes 2 (two) times daily.    [place] Kitchen  esomeprazole (NEXIUM) 20 MG packet Take 20 mg by mouth daily before breakfast.    . gabapentin (NEURONTIN) 300 MG capsule ONE CAPSULE TWICE DURING THE DAY AND 2 AT NIGHT 360 capsule 3  . Glucos-Chondroit-Hyaluron-MSM (GLUCOSAMINE CHONDROITIN JOINT PO) Take 1 tablet by mouth daily.    . hydrochlorothiazide (MICROZIDE) 12.5 MG capsule Take 1 capsule (12.5 mg total) by mouth daily. 90 capsule 1  . levothyroxine (SYNTHROID, LEVOTHROID) 25 MCG tablet Take 25 mcg by mouth 4 (four) times a week.    . loratadine (CLARITIN) 10 MG tablet Take 10 mg by mouth daily as needed. Reported on 05/03/2016    . Multiple Vitamins-Minerals (PRESERVISION AREDS 2) CAPS Take 1 tablet by mouth 2 (two) times daily.     . nebivolol (BYSTOLIC) 5 MG tablet Take 5 mg by mouth daily.    [place] Kitchen nystatin (NYSTATIN) powder Apply 1 application topically 3 (three) times daily. 15 g 0  . pyridOXINE (VITAMIN B-6) 100 MG tablet Take 100 mg by mouth daily.     No current facility-administered medications for this visit.    PHYSICAL EXAMINATION: ECOG  PERFORMANCE STATUS: 0 - Asymptomatic  Vitals:   02/10/20 1312  BP: 137/80  Pulse: 82  Resp: 17  Temp: (!) 97.4 F (36.3 C)  SpO2: 98%   Filed Weights   02/10/20 1312  Weight: 171 lb 12.8 oz (77.9 kg)    GENERAL:alert, no distress and comfortable SKIN: skin color, texture, turgor are normal, no rashes or significant lesions (+) Skin erythema, possible rash of right breast (+) Skin rash of lower stomach fold, likely fungal infection EYES: normal, Conjunctiva are pink and non-injected, sclera clear  NECK: supple, thyroid normal size, non-tender, without nodularity LYMPH:  no palpable lymphadenopathy in the cervical, axillary  LUNGS: clear to auscultation and percussion with normal breathing effort HEART: regular rate & rhythm and no murmurs and no lower extremity edema ABDOMEN:abdomen soft, non-tender and normal bowel sounds Musculoskeletal:no cyanosis of digits and no clubbing  NEURO: alert & oriented x 3 with fluent speech, no focal motor/sensory deficits BREAST: s/p left lumpectomy: Surgical incision healed well. (+)skin erythema under her left breast, possible fungal infection. No palpable mass, nodules or adenopathy bilaterally. Breast exam benign.   LABORATORY DATA:  I have reviewed the data as listed CBC Latest Ref Rng & Units 02/10/2020 01/16/2019 07/24/2018  WBC 4.0 - 10.5 K/uL 10.2 7.1 7.0  Hemoglobin 12.0 - 15.0 g/dL 14.9 14.4 14.9  Hematocrit 36.0 - 46.0 % 42.4 41.2 43.3  Platelets 150 - 400 K/uL 449(H) 366 365     CMP Latest Ref Rng & Units 02/10/2020 07/06/2019 01/16/2019  Glucose 70 - 99 mg/dL 96 - 96  BUN 8 - 23 mg/dL 11 - 10  Creatinine 0.44 - 1.00 mg/dL 0.84 0.86 0.92  Sodium 135 - 145 mmol/L 138 - 141  Potassium 3.5 - 5.1 mmol/L 3.1(L) - 3.8  Chloride 98 - 111 mmol/L 98 - 100  CO2 22 - 32 mmol/L 30 - 29  Calcium 8.9 - 10.3 mg/dL 9.5 - 10.2  Total Protein 6.5 - 8.1 g/dL 7.0 - 7.5  Total Bilirubin 0.3 - 1.2 mg/dL 0.6 - 0.9  Alkaline Phos 38 - 126 U/L 75 - 81    AST 15 - 41 U/L 22 - 26  ALT 0 - 44 U/L 18 - 16      RADIOGRAPHIC STUDIES: I have personally reviewed the radiological images as listed and agreed with the findings in the report. No results  found.   ASSESSMENT & PLAN:  Audrey [name] is a 79 y.o. female with    1. 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 She is s/p left breast lumpectomy, adjuvant radiation.  -Oncotype revealed recurrence score 7, which is a low risk, she would not benefit from adjuvant chemotherapy, so I did not recommend it.  -She declined anti-estrogen therapy.  -She is clinically doing well. Lab reviewed, her CBC and CMP are within normal limits except 449K and K 3.1. Her physical exam and her 12/2019 mammogram were unremarkable. There is no clinical concern for recurrence. -Continue breast cancer surveillance, with annual screening mammogram, self exam, and routine office visits with labs. Next Mammogram in 12/2020 -f/u in 9 months then yearly. She will f/u with her PCP  -She has received both her [place] Vaccinations.    2. HTN, arthritis,  -She will continue follow-up with her primary care physician, and exercise  3. Genetic testing was negative  4.Fibromyalgia and body pain -Her chronic body pain has improved with medication  -Follow-up with PCP Dr. Maudie Mercury  5. Hypothyroidism -Managed by her PCP  6. Bone Health  -2013 DEXA show low bone density. Her last DEXA through Dr. Maudie Mercury was at least 3 years ago.  -Per pt she had a DEXA with her PCP Dr. Coral Spikes in the last 2 years -Continue calcium and Vitamin D daily   7. Depressed Mood  -She notes for the past 2-4 weeks she has been feeling down and easy to cry.  -She denies overt stress except concern for her husbands decreasing memory. She feels her body feels off and is related to this.  -She plans to f/u with her PCP this Friday. I also  encouraged her to talk to someone. She can speak with our social worker if needed.   8. Fungal Infection -Seen on lower abdominal skin fold and left breast skin fold (02/10/20) -I will call in Nystatin Powder to treat and keep area dry and clean  -She also has recurrent itch of right breast, she may use Nystatin or OTC Hydrocortisone.   9. Hypokalemia -K 3.1 today, she is not on diuretics -will call in oral KCL 30mq daily for 2 weeks   Plan -I called in Nystatin Powder and KCL today -Lab and F/u in 9 months (in between her PCP visits) then yearly after  -Copy note and labs to her PCP Dr. GCoral Spikes she will discuss depression management with Dr. GCoral Spikeson her visit this Friday.    No problem-specific Assessment & Plan notes found for this encounter.   No orders of the defined types were placed in this encounter.  All questions were answered. The patient knows to call the clinic with any problems, questions or concerns. No barriers to learning was detected. The total time spent in the appointment was 30 minutes.     YTruitt Merle MD 02/10/2020   I, AJoslyn Devon am acting as scribe for YTruitt Merle MD.   I have reviewed the above documentation for accuracy and completeness, and I agree with the above.

## 2020-02-10 ENCOUNTER — Telehealth: Payer: Self-pay | Admitting: Hematology

## 2020-02-10 ENCOUNTER — Telehealth: Payer: Self-pay

## 2020-02-10 ENCOUNTER — Encounter: Payer: Self-pay | Admitting: Hematology

## 2020-02-10 ENCOUNTER — Inpatient Hospital Stay: Payer: PPO | Attending: Hematology

## 2020-02-10 ENCOUNTER — Other Ambulatory Visit: Payer: Self-pay

## 2020-02-10 ENCOUNTER — Inpatient Hospital Stay (HOSPITAL_BASED_OUTPATIENT_CLINIC_OR_DEPARTMENT_OTHER): Payer: PPO | Admitting: Hematology

## 2020-02-10 VITALS — BP 137/80 | HR 82 | Temp 97.4°F | Resp 17 | Ht 59.0 in | Wt 171.8 lb

## 2020-02-10 DIAGNOSIS — Z923 Personal history of irradiation: Secondary | ICD-10-CM | POA: Diagnosis not present

## 2020-02-10 DIAGNOSIS — I10 Essential (primary) hypertension: Secondary | ICD-10-CM | POA: Insufficient documentation

## 2020-02-10 DIAGNOSIS — F329 Major depressive disorder, single episode, unspecified: Secondary | ICD-10-CM | POA: Insufficient documentation

## 2020-02-10 DIAGNOSIS — R001 Bradycardia, unspecified: Secondary | ICD-10-CM | POA: Insufficient documentation

## 2020-02-10 DIAGNOSIS — K219 Gastro-esophageal reflux disease without esophagitis: Secondary | ICD-10-CM | POA: Insufficient documentation

## 2020-02-10 DIAGNOSIS — Z170 Estrogen receptor positive status [ER+]: Secondary | ICD-10-CM

## 2020-02-10 DIAGNOSIS — C50412 Malignant neoplasm of upper-outer quadrant of left female breast: Secondary | ICD-10-CM

## 2020-02-10 DIAGNOSIS — E039 Hypothyroidism, unspecified: Secondary | ICD-10-CM | POA: Insufficient documentation

## 2020-02-10 DIAGNOSIS — M797 Fibromyalgia: Secondary | ICD-10-CM | POA: Insufficient documentation

## 2020-02-10 DIAGNOSIS — M129 Arthropathy, unspecified: Secondary | ICD-10-CM | POA: Insufficient documentation

## 2020-02-10 DIAGNOSIS — E669 Obesity, unspecified: Secondary | ICD-10-CM | POA: Diagnosis not present

## 2020-02-10 DIAGNOSIS — Z79899 Other long term (current) drug therapy: Secondary | ICD-10-CM | POA: Insufficient documentation

## 2020-02-10 DIAGNOSIS — E876 Hypokalemia: Secondary | ICD-10-CM | POA: Insufficient documentation

## 2020-02-10 DIAGNOSIS — B488 Other specified mycoses: Secondary | ICD-10-CM | POA: Insufficient documentation

## 2020-02-10 LAB — CBC WITH DIFFERENTIAL/PLATELET
Abs Immature Granulocytes: 0.03 10*3/uL (ref 0.00–0.07)
Basophils Absolute: 0.1 10*3/uL (ref 0.0–0.1)
Basophils Relative: 1 %
Eosinophils Absolute: 0 10*3/uL (ref 0.0–0.5)
Eosinophils Relative: 0 %
HCT: 42.4 % (ref 36.0–46.0)
Hemoglobin: 14.9 g/dL (ref 12.0–15.0)
Immature Granulocytes: 0 %
Lymphocytes Relative: 17 %
Lymphs Abs: 1.7 10*3/uL (ref 0.7–4.0)
MCH: 31 pg (ref 26.0–34.0)
MCHC: 35.1 g/dL (ref 30.0–36.0)
MCV: 88.1 fL (ref 80.0–100.0)
Monocytes Absolute: 0.9 10*3/uL (ref 0.1–1.0)
Monocytes Relative: 8 %
Neutro Abs: 7.5 10*3/uL (ref 1.7–7.7)
Neutrophils Relative %: 74 %
Platelets: 449 10*3/uL — ABNORMAL HIGH (ref 150–400)
RBC: 4.81 MIL/uL (ref 3.87–5.11)
RDW: 12.9 % (ref 11.5–15.5)
WBC: 10.2 10*3/uL (ref 4.0–10.5)
nRBC: 0 % (ref 0.0–0.2)

## 2020-02-10 LAB — COMPREHENSIVE METABOLIC PANEL
ALT: 18 U/L (ref 0–44)
AST: 22 U/L (ref 15–41)
Albumin: 3.6 g/dL (ref 3.5–5.0)
Alkaline Phosphatase: 75 U/L (ref 38–126)
Anion gap: 10 (ref 5–15)
BUN: 11 mg/dL (ref 8–23)
CO2: 30 mmol/L (ref 22–32)
Calcium: 9.5 mg/dL (ref 8.9–10.3)
Chloride: 98 mmol/L (ref 98–111)
Creatinine, Ser: 0.84 mg/dL (ref 0.44–1.00)
GFR calc Af Amer: >60 mL/min (ref >60)
GFR calc non Af Amer: >60 mL/min (ref >60)
Glucose, Bld: 96 mg/dL (ref 70–99)
Potassium: 3.1 mmol/L — ABNORMAL LOW (ref 3.5–5.1)
Sodium: 138 mmol/L (ref 135–145)
Total Bilirubin: 0.6 mg/dL (ref 0.3–1.2)
Total Protein: 7 g/dL (ref 6.5–8.1)

## 2020-02-10 MED ORDER — POTASSIUM CHLORIDE CRYS ER 20 MEQ PO TBCR: 20.0000 meq | EXTENDED_RELEASE_TABLET | Freq: Every day | ORAL | 0 refills | Status: DC

## 2020-02-10 MED ORDER — NYSTATIN 100000 UNIT/GM EX POWD: 1.0000 "application " | Freq: Three times a day (TID) | CUTANEOUS | 0 refills | Status: DC

## 2020-02-10 NOTE — Telephone Encounter (Signed)
I spoke to Audrey [name] I let her know her K is 3.1.  I told her Dr. Burr Medico called in a rx for K.  She was instructed to take 1 tab daily for 14 days.  I told her I will fax Dr. Ernestina Penna ov note to her pcp, Kelton Pillar, MD.  She verbalized understanding

## 2020-02-10 NOTE — Telephone Encounter (Signed)
Scheduled per los. Gave avs and calendar  

## 2020-02-12 DIAGNOSIS — E876 Hypokalemia: Secondary | ICD-10-CM | POA: Diagnosis not present

## 2020-02-12 DIAGNOSIS — E039 Hypothyroidism, unspecified: Secondary | ICD-10-CM | POA: Diagnosis not present

## 2020-02-12 DIAGNOSIS — E7800 Pure hypercholesterolemia, unspecified: Secondary | ICD-10-CM | POA: Diagnosis not present

## 2020-02-12 DIAGNOSIS — B356 Tinea cruris: Secondary | ICD-10-CM | POA: Diagnosis not present

## 2020-02-12 DIAGNOSIS — I10 Essential (primary) hypertension: Secondary | ICD-10-CM | POA: Diagnosis not present

## 2020-02-12 DIAGNOSIS — E559 Vitamin D deficiency, unspecified: Secondary | ICD-10-CM | POA: Diagnosis not present

## 2020-02-15 ENCOUNTER — Telehealth: Payer: Self-pay | Admitting: Emergency Medicine

## 2020-02-15 NOTE — Telephone Encounter (Signed)
-----   Message from Truitt Merle, MD sent at 02/15/2020  1:55 PM EDT ----- Please inform pt that her K was low last week, probably related to HCTZ. Please call in KCL 15meq daily for 2 weeks for her, and encourage her to have high K diet, f/u with PCP.  Truitt Merle  02/15/2020

## 2020-02-15 NOTE — Telephone Encounter (Signed)
Called pt regarding results, spoke with pt's husband Joe who states he will pass on the message as pt is outside at the moment.  Results and recommendations per MD Burr Medico given to pt/spouse who verbalized understanding and is aware to f/u as needed with any questions or concerns.

## 2020-03-04 DIAGNOSIS — Z981 Arthrodesis status: Secondary | ICD-10-CM | POA: Diagnosis not present

## 2020-03-04 DIAGNOSIS — S22000A Wedge compression fracture of unspecified thoracic vertebra, initial encounter for closed fracture: Secondary | ICD-10-CM | POA: Diagnosis not present

## 2020-03-04 DIAGNOSIS — M546 Pain in thoracic spine: Secondary | ICD-10-CM | POA: Diagnosis not present

## 2020-03-07 DIAGNOSIS — E559 Vitamin D deficiency, unspecified: Secondary | ICD-10-CM | POA: Diagnosis not present

## 2020-03-07 DIAGNOSIS — E7800 Pure hypercholesterolemia, unspecified: Secondary | ICD-10-CM | POA: Diagnosis not present

## 2020-03-07 DIAGNOSIS — E876 Hypokalemia: Secondary | ICD-10-CM | POA: Diagnosis not present

## 2020-03-07 DIAGNOSIS — E039 Hypothyroidism, unspecified: Secondary | ICD-10-CM | POA: Diagnosis not present

## 2020-03-09 DIAGNOSIS — S22000A Wedge compression fracture of unspecified thoracic vertebra, initial encounter for closed fracture: Secondary | ICD-10-CM | POA: Diagnosis not present

## 2020-03-09 DIAGNOSIS — S22039A Unspecified fracture of third thoracic vertebra, initial encounter for closed fracture: Secondary | ICD-10-CM | POA: Diagnosis not present

## 2020-03-09 DIAGNOSIS — S22040A Wedge compression fracture of fourth thoracic vertebra, initial encounter for closed fracture: Secondary | ICD-10-CM | POA: Diagnosis not present

## 2020-03-11 DIAGNOSIS — M546 Pain in thoracic spine: Secondary | ICD-10-CM | POA: Diagnosis not present

## 2020-03-11 DIAGNOSIS — S22000A Wedge compression fracture of unspecified thoracic vertebra, initial encounter for closed fracture: Secondary | ICD-10-CM | POA: Diagnosis not present

## 2020-03-15 ENCOUNTER — Other Ambulatory Visit: Payer: Self-pay | Admitting: Neurosurgery

## 2020-03-15 ENCOUNTER — Other Ambulatory Visit: Payer: Self-pay

## 2020-03-15 ENCOUNTER — Encounter (HOSPITAL_COMMUNITY): Payer: Self-pay | Admitting: Neurosurgery

## 2020-03-15 DIAGNOSIS — M545 Low back pain: Secondary | ICD-10-CM | POA: Diagnosis not present

## 2020-03-15 NOTE — Progress Notes (Addendum)
Audrey [name]  denies chest pain  or shortness of breath, no s/s of Covid. Patient will be tested for Covid on arrival. [name] states she has whip las from the fall,and had needling today. Patient request that every be careful when moving her.

## 2020-03-16 ENCOUNTER — Ambulatory Visit (HOSPITAL_COMMUNITY): Payer: PPO | Admitting: Certified Registered Nurse Anesthetist

## 2020-03-16 ENCOUNTER — Encounter (HOSPITAL_COMMUNITY)
Admission: RE | Disposition: A | Discharge status: Home / Self Care | Payer: Self-pay | Source: Home / Self Care | Attending: Neurosurgery

## 2020-03-16 ENCOUNTER — Observation Stay (HOSPITAL_COMMUNITY)
Admission: RE | Admit: 2020-03-16 | Discharge: 2020-03-16 | Disposition: A | Discharge status: Home / Self Care | Payer: PPO | Attending: Neurosurgery | Admitting: Neurosurgery

## 2020-03-16 ENCOUNTER — Ambulatory Visit (HOSPITAL_COMMUNITY): Payer: PPO

## 2020-03-16 ENCOUNTER — Encounter (HOSPITAL_COMMUNITY): Payer: Self-pay | Admitting: Neurosurgery

## 2020-03-16 ENCOUNTER — Other Ambulatory Visit: Payer: Self-pay | Admitting: Neurosurgery

## 2020-03-16 ENCOUNTER — Other Ambulatory Visit: Payer: Self-pay

## 2020-03-16 DIAGNOSIS — E039 Hypothyroidism, unspecified: Secondary | ICD-10-CM | POA: Diagnosis not present

## 2020-03-16 DIAGNOSIS — Z79899 Other long term (current) drug therapy: Secondary | ICD-10-CM | POA: Insufficient documentation

## 2020-03-16 DIAGNOSIS — S22030A Wedge compression fracture of third thoracic vertebra, initial encounter for closed fracture: Secondary | ICD-10-CM | POA: Diagnosis not present

## 2020-03-16 DIAGNOSIS — M797 Fibromyalgia: Secondary | ICD-10-CM | POA: Insufficient documentation

## 2020-03-16 DIAGNOSIS — S22040A Wedge compression fracture of fourth thoracic vertebra, initial encounter for closed fracture: Secondary | ICD-10-CM | POA: Insufficient documentation

## 2020-03-16 DIAGNOSIS — Z20822 Contact with and (suspected) exposure to covid-19: Secondary | ICD-10-CM | POA: Diagnosis not present

## 2020-03-16 DIAGNOSIS — K219 Gastro-esophageal reflux disease without esophagitis: Secondary | ICD-10-CM | POA: Insufficient documentation

## 2020-03-16 DIAGNOSIS — Z79890 Hormone replacement therapy (postmenopausal): Secondary | ICD-10-CM | POA: Diagnosis not present

## 2020-03-16 DIAGNOSIS — G609 Hereditary and idiopathic neuropathy, unspecified: Secondary | ICD-10-CM | POA: Insufficient documentation

## 2020-03-16 DIAGNOSIS — S22050A Wedge compression fracture of T5-T6 vertebra, initial encounter for closed fracture: Secondary | ICD-10-CM | POA: Diagnosis not present

## 2020-03-16 DIAGNOSIS — Z6835 Body mass index (BMI) 35.0-35.9, adult: Secondary | ICD-10-CM | POA: Insufficient documentation

## 2020-03-16 DIAGNOSIS — Z853 Personal history of malignant neoplasm of breast: Secondary | ICD-10-CM | POA: Insufficient documentation

## 2020-03-16 DIAGNOSIS — I10 Essential (primary) hypertension: Secondary | ICD-10-CM | POA: Diagnosis not present

## 2020-03-16 DIAGNOSIS — Z981 Arthrodesis status: Secondary | ICD-10-CM | POA: Diagnosis not present

## 2020-03-16 DIAGNOSIS — W19XXXA Unspecified fall, initial encounter: Secondary | ICD-10-CM | POA: Insufficient documentation

## 2020-03-16 DIAGNOSIS — S22000A Wedge compression fracture of unspecified thoracic vertebra, initial encounter for closed fracture: Secondary | ICD-10-CM | POA: Diagnosis not present

## 2020-03-16 DIAGNOSIS — E876 Hypokalemia: Secondary | ICD-10-CM | POA: Insufficient documentation

## 2020-03-16 DIAGNOSIS — Z419 Encounter for procedure for purposes other than remedying health state, unspecified: Secondary | ICD-10-CM

## 2020-03-16 DIAGNOSIS — S22009A Unspecified fracture of unspecified thoracic vertebra, initial encounter for closed fracture: Secondary | ICD-10-CM | POA: Diagnosis present

## 2020-03-16 HISTORY — DX: Fibromyalgia: M79.7

## 2020-03-16 HISTORY — PX: KYPHOPLASTY: SHX5884

## 2020-03-16 LAB — CBC
HCT: 40.9 % (ref 36.0–46.0)
Hemoglobin: 14.1 g/dL (ref 12.0–15.0)
MCH: 30.3 pg (ref 26.0–34.0)
MCHC: 34.5 g/dL (ref 30.0–36.0)
MCV: 88 fL (ref 80.0–100.0)
Platelets: 446 10*3/uL — ABNORMAL HIGH (ref 150–400)
RBC: 4.65 MIL/uL (ref 3.87–5.11)
RDW: 12.8 % (ref 11.5–15.5)
WBC: 10.4 10*3/uL (ref 4.0–10.5)
nRBC: 0 % (ref 0.0–0.2)

## 2020-03-16 LAB — RESPIRATORY PANEL BY RT PCR (FLU A&B, COVID)
Influenza A by PCR: NEGATIVE
Influenza B by PCR: NEGATIVE
SARS Coronavirus 2 by RT PCR: NEGATIVE

## 2020-03-16 LAB — COMPREHENSIVE METABOLIC PANEL
ALT: 17 U/L (ref 0–44)
AST: 20 U/L (ref 15–41)
Albumin: 3.5 g/dL (ref 3.5–5.0)
Alkaline Phosphatase: 102 U/L (ref 38–126)
Anion gap: 15 (ref 5–15)
BUN: 9 mg/dL (ref 8–23)
CO2: 24 mmol/L (ref 22–32)
Calcium: 9.5 mg/dL (ref 8.9–10.3)
Chloride: 98 mmol/L (ref 98–111)
Creatinine, Ser: 0.78 mg/dL (ref 0.44–1.00)
GFR calc Af Amer: >60 mL/min (ref >60)
GFR calc non Af Amer: >60 mL/min (ref >60)
Glucose, Bld: 97 mg/dL (ref 70–99)
Potassium: 2.8 mmol/L — ABNORMAL LOW (ref 3.5–5.1)
Sodium: 137 mmol/L (ref 135–145)
Total Bilirubin: 0.9 mg/dL (ref 0.3–1.2)
Total Protein: 6.8 g/dL (ref 6.5–8.1)

## 2020-03-16 SURGERY — KYPHOPLASTY
Anesthesia: General | Site: Back

## 2020-03-16 MED ORDER — OXYCODONE HCL 5 MG PO TABS: 5.0000 mg | ORAL_TABLET | Freq: Once | ORAL | Status: DC | PRN

## 2020-03-16 MED ORDER — DEXAMETHASONE SODIUM PHOSPHATE 10 MG/ML IJ SOLN
INTRAMUSCULAR | Status: AC
  Filled 2020-03-16: qty 1

## 2020-03-16 MED ORDER — BACITRACIN ZINC 500 UNIT/GM EX OINT
TOPICAL_OINTMENT | CUTANEOUS | Status: AC
  Filled 2020-03-16: qty 28.35

## 2020-03-16 MED ORDER — ONDANSETRON HCL 4 MG/2ML IJ SOLN
INTRAMUSCULAR | Status: AC
  Filled 2020-03-16: qty 2

## 2020-03-16 MED ORDER — PHENYLEPHRINE HCL-NACL 10-0.9 MG/250ML-% IV SOLN
INTRAVENOUS | Status: DC | PRN
  Administered 2020-03-16: 40 ug/min via INTRAVENOUS

## 2020-03-16 MED ORDER — SODIUM CHLORIDE 0.9% FLUSH
3.0000 mL | Freq: Two times a day (BID) | INTRAVENOUS | Status: DC
  Administered 2020-03-16: 3 mL via INTRAVENOUS

## 2020-03-16 MED ORDER — HYDROXYZINE HCL 50 MG/ML IM SOLN: 50.0000 mg | INTRAMUSCULAR | Status: DC | PRN

## 2020-03-16 MED ORDER — PROPOFOL 10 MG/ML IV BOLUS
INTRAVENOUS | Status: DC | PRN
  Administered 2020-03-16: 110 mg via INTRAVENOUS

## 2020-03-16 MED ORDER — ACETAMINOPHEN 10 MG/ML IV SOLN: 1000.0000 mg | Freq: Once | INTRAVENOUS | Status: DC | PRN

## 2020-03-16 MED ORDER — ACETAMINOPHEN 10 MG/ML IV SOLN
INTRAVENOUS | Status: DC | PRN
  Administered 2020-03-16: 1000 mg via INTRAVENOUS

## 2020-03-16 MED ORDER — DEXMEDETOMIDINE HCL 200 MCG/2ML IV SOLN
INTRAVENOUS | Status: DC | PRN
  Administered 2020-03-16: 4 ug via INTRAVENOUS
  Administered 2020-03-16 (×2): 8 ug via INTRAVENOUS

## 2020-03-16 MED ORDER — PROPOFOL 10 MG/ML IV BOLUS
INTRAVENOUS | Status: AC
  Filled 2020-03-16: qty 20

## 2020-03-16 MED ORDER — HYDROCODONE-ACETAMINOPHEN 5-325 MG PO TABS
1.0000 | ORAL_TABLET | ORAL | Status: DC | PRN
  Administered 2020-03-16: 1 via ORAL
  Filled 2020-03-16: qty 1

## 2020-03-16 MED ORDER — KETOROLAC TROMETHAMINE 30 MG/ML IJ SOLN
INTRAMUSCULAR | Status: AC
  Filled 2020-03-16: qty 1

## 2020-03-16 MED ORDER — PHENOL 1.4 % MT LIQD: 1.0000 | OROMUCOSAL | Status: DC | PRN

## 2020-03-16 MED ORDER — MENTHOL 3 MG MT LOZG: 1.0000 | LOZENGE | OROMUCOSAL | Status: DC | PRN

## 2020-03-16 MED ORDER — DEXAMETHASONE SODIUM PHOSPHATE 10 MG/ML IJ SOLN
INTRAMUSCULAR | Status: DC | PRN
  Administered 2020-03-16: 8 mg via INTRAVENOUS

## 2020-03-16 MED ORDER — MAGNESIUM HYDROXIDE 400 MG/5ML PO SUSP: 30.0000 mL | Freq: Every day | ORAL | Status: DC | PRN

## 2020-03-16 MED ORDER — HYDROCODONE-ACETAMINOPHEN 5-325 MG PO TABS: 1.0000 | ORAL_TABLET | ORAL | 0 refills | Status: DC | PRN

## 2020-03-16 MED ORDER — LACTATED RINGERS IV SOLN: INTRAVENOUS | Status: DC

## 2020-03-16 MED ORDER — PROPOFOL 500 MG/50ML IV EMUL
INTRAVENOUS | Status: DC | PRN
  Administered 2020-03-16: 50 ug/kg/min via INTRAVENOUS

## 2020-03-16 MED ORDER — CHLORHEXIDINE GLUCONATE CLOTH 2 % EX PADS: 6.0000 | MEDICATED_PAD | Freq: Once | CUTANEOUS | Status: DC

## 2020-03-16 MED ORDER — ROCURONIUM BROMIDE 10 MG/ML (PF) SYRINGE
PREFILLED_SYRINGE | INTRAVENOUS | Status: DC | PRN
  Administered 2020-03-16: 10 mg via INTRAVENOUS
  Administered 2020-03-16: 70 mg via INTRAVENOUS
  Administered 2020-03-16: 20 mg via INTRAVENOUS

## 2020-03-16 MED ORDER — OXYCODONE HCL 5 MG/5ML PO SOLN: 5.0000 mg | Freq: Once | ORAL | Status: DC | PRN

## 2020-03-16 MED ORDER — LABETALOL HCL 5 MG/ML IV SOLN
INTRAVENOUS | Status: AC
  Filled 2020-03-16: qty 4

## 2020-03-16 MED ORDER — BUPIVACAINE HCL (PF) 0.5 % IJ SOLN
INTRAMUSCULAR | Status: DC | PRN
  Administered 2020-03-16: 10.5 mL

## 2020-03-16 MED ORDER — ONDANSETRON HCL 4 MG PO TABS: 4.0000 mg | ORAL_TABLET | Freq: Four times a day (QID) | ORAL | Status: DC | PRN

## 2020-03-16 MED ORDER — POTASSIUM CHLORIDE IN NACL 40-0.9 MEQ/L-% IV SOLN
INTRAVENOUS | Status: DC
  Filled 2020-03-16: qty 1000

## 2020-03-16 MED ORDER — LIDOCAINE 2% (20 MG/ML) 5 ML SYRINGE
INTRAMUSCULAR | Status: AC
  Filled 2020-03-16: qty 5

## 2020-03-16 MED ORDER — BISACODYL 10 MG RE SUPP: 10.0000 mg | Freq: Every day | RECTAL | Status: DC | PRN

## 2020-03-16 MED ORDER — SUGAMMADEX SODIUM 200 MG/2ML IV SOLN
INTRAVENOUS | Status: DC | PRN
  Administered 2020-03-16: 200 mg via INTRAVENOUS

## 2020-03-16 MED ORDER — MORPHINE SULFATE (PF) 4 MG/ML IV SOLN: 4.0000 mg | INTRAVENOUS | Status: DC | PRN

## 2020-03-16 MED ORDER — CYCLOBENZAPRINE HCL 5 MG PO TABS: 5.0000 mg | ORAL_TABLET | Freq: Three times a day (TID) | ORAL | Status: DC | PRN

## 2020-03-16 MED ORDER — ACETAMINOPHEN 10 MG/ML IV SOLN
INTRAVENOUS | Status: AC
  Filled 2020-03-16: qty 100

## 2020-03-16 MED ORDER — LIDOCAINE-EPINEPHRINE 1 %-1:100000 IJ SOLN
INTRAMUSCULAR | Status: AC
  Filled 2020-03-16: qty 1

## 2020-03-16 MED ORDER — KETOROLAC TROMETHAMINE 15 MG/ML IJ SOLN
INTRAMUSCULAR | Status: AC
  Filled 2020-03-16: qty 1

## 2020-03-16 MED ORDER — ROCURONIUM BROMIDE 10 MG/ML (PF) SYRINGE
PREFILLED_SYRINGE | INTRAVENOUS | Status: AC
  Filled 2020-03-16: qty 10

## 2020-03-16 MED ORDER — FENTANYL CITRATE (PF) 100 MCG/2ML IJ SOLN: 25.0000 ug | INTRAMUSCULAR | Status: DC | PRN

## 2020-03-16 MED ORDER — KETOROLAC TROMETHAMINE 30 MG/ML IJ SOLN
15.0000 mg | Freq: Once | INTRAMUSCULAR | Status: AC
  Administered 2020-03-16: 15 mg via INTRAVENOUS

## 2020-03-16 MED ORDER — 0.9 % SODIUM CHLORIDE (POUR BTL) OPTIME
TOPICAL | Status: DC | PRN
  Administered 2020-03-16: 1000 mL

## 2020-03-16 MED ORDER — LIDOCAINE 2% (20 MG/ML) 5 ML SYRINGE
INTRAMUSCULAR | Status: DC | PRN
  Administered 2020-03-16: 60 mg via INTRAVENOUS

## 2020-03-16 MED ORDER — ALUM & MAG HYDROXIDE-SIMETH 200-200-20 MG/5ML PO SUSP: 30.0000 mL | Freq: Four times a day (QID) | ORAL | Status: DC | PRN

## 2020-03-16 MED ORDER — BUPIVACAINE HCL (PF) 0.5 % IJ SOLN
INTRAMUSCULAR | Status: AC
  Filled 2020-03-16: qty 30

## 2020-03-16 MED ORDER — SODIUM CHLORIDE 0.9 % IV SOLN
250.0000 mL | INTRAVENOUS | Status: DC
  Administered 2020-03-16: 13:00:00 250 mL via INTRAVENOUS

## 2020-03-16 MED ORDER — SODIUM CHLORIDE 0.9% FLUSH: 3.0000 mL | INTRAVENOUS | Status: DC | PRN

## 2020-03-16 MED ORDER — ACETAMINOPHEN 500 MG PO TABS: 1000.0000 mg | ORAL_TABLET | Freq: Once | ORAL | Status: DC | PRN

## 2020-03-16 MED ORDER — ONDANSETRON HCL 4 MG/2ML IJ SOLN
4.0000 mg | Freq: Four times a day (QID) | INTRAMUSCULAR | Status: DC | PRN
  Administered 2020-03-16: 4 mg via INTRAVENOUS

## 2020-03-16 MED ORDER — FLEET ENEMA 7-19 GM/118ML RE ENEM: 1.0000 | ENEMA | Freq: Once | RECTAL | Status: DC | PRN

## 2020-03-16 MED ORDER — LIDOCAINE-EPINEPHRINE 1 %-1:100000 IJ SOLN
INTRAMUSCULAR | Status: DC | PRN
  Administered 2020-03-16: 10.5 mL

## 2020-03-16 MED ORDER — FENTANYL CITRATE (PF) 250 MCG/5ML IJ SOLN
INTRAMUSCULAR | Status: AC
  Filled 2020-03-16: qty 5

## 2020-03-16 MED ORDER — ACETAMINOPHEN 160 MG/5ML PO SOLN: 1000.0000 mg | Freq: Once | ORAL | Status: DC | PRN

## 2020-03-16 MED ORDER — HYDROXYZINE HCL 25 MG PO TABS: 50.0000 mg | ORAL_TABLET | ORAL | Status: DC | PRN

## 2020-03-16 MED ORDER — CEFAZOLIN SODIUM-DEXTROSE 2-4 GM/100ML-% IV SOLN
2.0000 g | INTRAVENOUS | Status: AC
  Administered 2020-03-16: 2 g via INTRAVENOUS
  Filled 2020-03-16: qty 100

## 2020-03-16 MED ORDER — POTASSIUM CHLORIDE CRYS ER 20 MEQ PO TBCR
40.0000 meq | EXTENDED_RELEASE_TABLET | Freq: Once | ORAL | Status: AC
  Administered 2020-03-16: 15:00:00 40 meq via ORAL
  Filled 2020-03-16: qty 2

## 2020-03-16 MED ORDER — ACETAMINOPHEN 325 MG PO TABS: 650.0000 mg | ORAL_TABLET | ORAL | Status: DC | PRN

## 2020-03-16 MED ORDER — ONDANSETRON HCL 4 MG/2ML IJ SOLN
INTRAMUSCULAR | Status: DC | PRN
  Administered 2020-03-16: 4 mg via INTRAVENOUS

## 2020-03-16 MED ORDER — ACETAMINOPHEN 650 MG RE SUPP: 650.0000 mg | RECTAL | Status: DC | PRN

## 2020-03-16 MED ORDER — IOPAMIDOL (ISOVUE-300) INJECTION 61%
INTRAVENOUS | Status: DC | PRN
  Administered 2020-03-16: 50 mL

## 2020-03-16 SURGICAL SUPPLY — 47 items
BENZOIN TINCTURE PRP APPL 2/3 (GAUZE/BANDAGES/DRESSINGS) ×2 IMPLANT
BLADE CLIPPER SURG (BLADE) IMPLANT
BLADE SURG 11 STRL SS (BLADE) ×2 IMPLANT
BNDG ADH 1X3 SHEER STRL LF (GAUZE/BANDAGES/DRESSINGS) IMPLANT
CEMENT KYPHON C01A KIT/MIXER (Cement) ×4 IMPLANT
CNTNR URN SCR LID CUP LEK RST (MISCELLANEOUS) ×1 IMPLANT
CONT SPEC 4OZ STRL OR WHT (MISCELLANEOUS) ×2
COVER WAND RF STERILE (DRAPES) IMPLANT
DECANTER SPIKE VIAL GLASS SM (MISCELLANEOUS) ×2 IMPLANT
DERMABOND ADVANCED (GAUZE/BANDAGES/DRESSINGS)
DERMABOND ADVANCED .7 DNX12 (GAUZE/BANDAGES/DRESSINGS) IMPLANT
DRAPE C-ARM 42X72 X-RAY (DRAPES) ×2 IMPLANT
DRAPE HALF SHEET 40X57 (DRAPES) ×2 IMPLANT
DRAPE INCISE IOBAN 66X45 STRL (DRAPES) ×2 IMPLANT
DRAPE LAPAROTOMY 100X72X124 (DRAPES) ×2 IMPLANT
DRAPE WARM FLUID 44X44 (DRAPES) ×2 IMPLANT
DRSG OPSITE POSTOP 4X6 (GAUZE/BANDAGES/DRESSINGS) ×2 IMPLANT
DURAPREP 26ML APPLICATOR (WOUND CARE) ×2 IMPLANT
GAUZE 4X4 16PLY RFD (DISPOSABLE) ×4 IMPLANT
GLOVE BIOGEL PI IND STRL 6.5 (GLOVE) ×2 IMPLANT
GLOVE BIOGEL PI IND STRL 7.0 (GLOVE) ×2 IMPLANT
GLOVE BIOGEL PI IND STRL 7.5 (GLOVE) ×1 IMPLANT
GLOVE BIOGEL PI IND STRL 8 (GLOVE) ×1 IMPLANT
GLOVE BIOGEL PI INDICATOR 6.5 (GLOVE) ×2
GLOVE BIOGEL PI INDICATOR 7.0 (GLOVE) ×2
GLOVE BIOGEL PI INDICATOR 7.5 (GLOVE) ×1
GLOVE BIOGEL PI INDICATOR 8 (GLOVE) ×1
GLOVE ECLIPSE 6.5 STRL STRAW (GLOVE) ×12 IMPLANT
GLOVE ECLIPSE 7.5 STRL STRAW (GLOVE) ×2 IMPLANT
GLOVE EXAM NITRILE XL STR (GLOVE) IMPLANT
GOWN STRL REUS W/ TWL LRG LVL3 (GOWN DISPOSABLE) ×4 IMPLANT
GOWN STRL REUS W/ TWL XL LVL3 (GOWN DISPOSABLE) ×1 IMPLANT
GOWN STRL REUS W/TWL 2XL LVL3 (GOWN DISPOSABLE) IMPLANT
GOWN STRL REUS W/TWL LRG LVL3 (GOWN DISPOSABLE) ×8
GOWN STRL REUS W/TWL XL LVL3 (GOWN DISPOSABLE) ×2
KIT BASIN OR (CUSTOM PROCEDURE TRAY) ×2 IMPLANT
KIT TURNOVER KIT B (KITS) ×2 IMPLANT
NEEDLE HYPO 25X1 1.5 SAFETY (NEEDLE) ×2 IMPLANT
NS IRRIG 1000ML POUR BTL (IV SOLUTION) ×2 IMPLANT
PACK SURGICAL SETUP 50X90 (CUSTOM PROCEDURE TRAY) ×2 IMPLANT
PAD ARMBOARD 7.5X6 YLW CONV (MISCELLANEOUS) ×6 IMPLANT
STRIP CLOSURE SKIN 1/4X4 (GAUZE/BANDAGES/DRESSINGS) ×2 IMPLANT
SUT VIC AB 4-0 RB1 18 (SUTURE) ×2 IMPLANT
SYR CONTROL 10ML LL (SYRINGE) ×4 IMPLANT
TOWEL GREEN STERILE (TOWEL DISPOSABLE) ×2 IMPLANT
TOWEL GREEN STERILE FF (TOWEL DISPOSABLE) ×2 IMPLANT
TRAY KYPHOPAK 15/2 EXPRESS (KITS) ×4 IMPLANT

## 2020-03-16 NOTE — Discharge Summary (Signed)
Physician Discharge Summary  Patient ID: Audrey [name] MRN: [id number] DOB/AGE: [birth year] 79 y.o.  Admit date: 03/16/2020 Discharge date: 03/16/2020  Admission Diagnoses:  1) Multiple thoracic wedge compression fractures; T3, T4, and T6, 2) upper thoracic spine pain  Discharge Diagnoses:  1) Multiple thoracic wedge compression fractures; T3, T4, and T6, 2) upper thoracic spine pain Active Problems:   Thoracic vertebral fracture [facility])   Discharged Condition: good  Hospital Course: Patient was admitted, underwent a 3 level, T3, T4, and T6 kyphoplasty for multiple thoracic wedge compression fractures.  Postoperatively she has had significant relief of her disabling back pain.  She is up and ambulating actively.  She is voiding well.  Her dressing is clean and dry.  She is being discharged home with instructions regarding wound care and activities.  She is scheduled for follow-up with me in the office in a week and a half, with x-rays.  Laboratories done today showed significant hypokalemia (2.8).  She was given KCl 40 mEq p.o. postoperatively.  I instructed her that she needs to follow-up with her primary physician, who is Dr. Kelton Pillar.  Patient explains to me that in fact earlier this year she was found to have significant hypokalemia and was treated with p.o. KCl for a few weeks, but repeat laboratory showed normalization of her potassium level, and therefore she was not continued on KCl.  She is assured me that she will contact Dr. Delene Ruffini office tomorrow or the following day.  Discharge Exam: Blood pressure 128/67, pulse 75, temperature 98 F (36.7 C), temperature source Oral, resp. rate 18, height 4\' 10"  (1.473 m), weight 78 kg, SpO2 96 %.  Disposition: Discharge disposition: 01-Home or Self Care       Discharge Instructions    Discharge wound care:   Complete by: As directed    Leave honeycomb dressing over incisions, remove it on Monday, April 26 and leave the wounds  open to air at that point.  Honeycomb dressing is waterproof, so you may shower tomorrow with the dressing on.  Once the dressing is removed on Monday, April 26 it is likely that the Steri-Strips will wash off in the shower.   Driving Restrictions   Complete by: As directed    No driving for 2 weeks. May ride in the car locally now. May begin to drive locally in 2 weeks.   Other Restrictions   Complete by: As directed    Walk gradually increasing distances out in the fresh air at least twice a day. Walking additional 6 times inside the house, gradually increasing distances, daily. No bending, lifting, or twisting. Perform activities between shoulder and waist height (that is at counter height when standing or table height when sitting).     Allergies as of 03/16/2020      Reactions   Contrast Media [iodinated Diagnostic Agents] Shortness Of Breath   Other Swelling   Cat gut sutures Cats and dogs               Derma bond- intolerance- itching- tolerates with Benadryl       Cymbalta [duloxetine Hcl]    Diarrhea   Macrodantin [nitrofurantoin] Rash      Medication List    TAKE these medications   CALCIUM + D3 PO Take 1 each by mouth daily. 500 vit D   cholecalciferol 25 MCG (1000 UNIT) tablet Commonly known as: VITAMIN D Take 1,000 Units by mouth daily.   Co-Enzyme Q10 100 MG Caps Take 100 mg  by mouth daily.   CRANBERRY PO Take 4,200 mg by mouth 2 (two) times daily.   cycloSPORINE 0.05 % ophthalmic emulsion Commonly known as: RESTASIS Place 1 drop into both eyes daily as needed (Dry eyes).   esomeprazole 20 MG packet Commonly known as: NEXIUM Take 20 mg by mouth daily before breakfast.   gabapentin 300 MG capsule Commonly known as: NEURONTIN ONE CAPSULE TWICE DURING THE DAY AND 2 AT NIGHT What changed:   how much to take  how to take this  when to take this  additional instructions   GLUCOSAMINE CHONDROITIN JOINT PO Take 1 tablet by mouth 2 (two) times daily.    hydrochlorothiazide 12.5 MG capsule Commonly known as: MICROZIDE Take 1 capsule (12.5 mg total) by mouth daily.   HYDROcodone-acetaminophen 5-325 MG tablet Commonly known as: NORCO/VICODIN Take 1 tablet by mouth every 4 (four) hours as needed (pain).   levothyroxine 25 MCG tablet Commonly known as: SYNTHROID Take 25 mcg by mouth 4 (four) times a week. Mon. Wed and Friday and one on the weekend   loratadine 10 MG tablet Commonly known as: CLARITIN Take 10 mg by mouth daily as needed for allergies.   magnesium oxide 400 MG tablet Commonly known as: MAG-OX Take 200 mg by mouth daily.   naproxen sodium 220 MG tablet Commonly known as: ALEVE Take 220 mg by mouth daily.   nebivolol 5 MG tablet Commonly known as: BYSTOLIC Take 5 mg by mouth daily.   Omega 3 1200 MG Caps Take 1,280 mg by mouth 2 (two) times daily.   PreserVision AREDS 2 Caps Take 1 tablet by mouth 2 (two) times daily.   pyridOXINE 100 MG tablet Commonly known as: VITAMIN B-6 Take 100 mg by mouth daily.   Systane Ultra PF 0.4-0.3 % Soln Generic drug: Polyethyl Glyc-Propyl Glyc PF Place 1 drop into both eyes daily.   Vitamin B-12 5000 MCG Subl Place 5,000 mcg under the tongue daily.            Discharge Care Instructions  (From admission, onward)         Start     Ordered   03/16/20 0000  Discharge wound care:    Comments: Leave honeycomb dressing over incisions, remove it on Monday, April 26 and leave the wounds open to air at that point.  Honeycomb dressing is waterproof, so you may shower tomorrow with the dressing on.  Once the dressing is removed on Monday, April 26 it is likely that the Steri-Strips will wash off in the shower.   03/16/20 1714           Signed: Hosie Spangle 03/16/2020, 5:15 PM

## 2020-03-16 NOTE — Discharge Instructions (Signed)
°  Call Your Doctor If Any of These Occur °Redness, drainage, or swelling at the wound.  °Temperature greater than 101 degrees. °Severe pain not relieved by pain medication. °Incision starts to come apart. °Follow Up Appt °Call today for appointment in 3 weeks ([phone]) or for problems.  If you have any hardware placed in your spine, you will need an x-ray before your appointment. °

## 2020-03-16 NOTE — Anesthesia Procedure Notes (Signed)
Procedure Name: Intubation Performed by: Milford Cage, CRNA Pre-anesthesia Checklist: Patient identified, Emergency Drugs available, Suction available and Patient being monitored Patient Re-evaluated:Patient Re-evaluated prior to induction Oxygen Delivery Method: Circle System Utilized Preoxygenation: Pre-oxygenation with 100% oxygen Induction Type: IV induction Ventilation: Mask ventilation without difficulty Laryngoscope Size: Mac and 3 Grade View: Grade II Tube type: Oral Tube size: 7.0 mm Number of attempts: 1 Airway Equipment and Method: Stylet Placement Confirmation: ETT inserted through vocal cords under direct vision,  positive ETCO2 and breath sounds checked- equal and bilateral Secured at: 20 cm Tube secured with: Tape Dental Injury: Teeth and Oropharynx as per pre-operative assessment

## 2020-03-16 NOTE — Anesthesia Preprocedure Evaluation (Signed)
Anesthesia Evaluation  Patient identified by MRN, date of birth, ID band Patient awake    Reviewed: Allergy & Precautions, NPO status , Patient's Chart, lab work & pertinent test results, reviewed documented beta blocker date and time   History of Anesthesia Complications (+) PONV and history of anesthetic complications  Airway Mallampati: III  TM Distance: >3 FB Neck ROM: Full    Dental  (+) Dental Advisory Given, Teeth Intact   Pulmonary shortness of breath, neg recent URI,    breath sounds clear to auscultation       Cardiovascular hypertension, Pt. on medications and Pt. on home beta blockers (-) angina(-) Past MI and (-) CHF (-) dysrhythmias  Rhythm:Regular     Neuro/Psych WEDGE COMPRESSION FRACTURE 3/4/6  Neuromuscular disease negative psych ROS   GI/Hepatic Neg liver ROS, GERD  Medicated and Controlled,  Endo/Other  Hypothyroidism Morbid obesity  Renal/GU negative Renal ROS     Musculoskeletal  (+) Arthritis , Fibromyalgia -  Abdominal   Peds  Hematology   Anesthesia Other Findings Nl stress and tte in 2013  Reproductive/Obstetrics                             Anesthesia Physical Anesthesia Plan  ASA: II  Anesthesia Plan: General   Post-op Pain Management:    Induction: Intravenous  PONV Risk Score and Plan: 4 or greater and Ondansetron, Dexamethasone and Propofol infusion  Airway Management Planned: Oral ETT  Additional Equipment: None  Intra-op Plan:   Post-operative Plan: Extubation in OR  Informed Consent: I have reviewed the patients History and Physical, chart, labs and discussed the procedure including the risks, benefits and alternatives for the proposed anesthesia with the patient or authorized representative who has indicated his/her understanding and acceptance.     Dental advisory given  Plan Discussed with: CRNA and Surgeon  Anesthesia Plan Comments:          Anesthesia Quick Evaluation

## 2020-03-16 NOTE — Progress Notes (Signed)
Per Dr. Ermalene Postin and Dr. Sherwood Gambler, patient can proceed to OR without labs resulted.  Patient had labs completed on 02/10/20.

## 2020-03-16 NOTE — Plan of Care (Signed)
Patient alert and oriented, mae's well, voiding adequate amount of urine, swallowing without difficulty, no c/o pain at time of discharge. Patient discharged home with family. Script and discharged instructions given to patient. Patient and family stated understanding of instructions given. Patient has an appointment with Dr. Nudelman 

## 2020-03-16 NOTE — Op Note (Signed)
03/16/2020  10:45 AM  PATIENT:  Audrey [name]  79 y.o. female  PRE-OPERATIVE DIAGNOSIS:  MULTIPLE WEDGE COMPRESSION FRACTURES , THORACIC VERTEBRA, thoracic 3, thoracic 4, and thoracic 6; upper thoracic pain  POST-OPERATIVE DIAGNOSIS:  MULTIPLE WEDGE COMPRESSION FRACTURES , THORACIC VERTEBRA, thoracic 3, thoracic 4, and thoracic 6; upper thoracic pain  PROCEDURE:  Procedure(s):  KYPHOPLASTY THORACIC THREE, THORACIC FOUR, THORACIC SIX  SURGEON:  Surgeon(s): Jovita Gamma, MD  ANESTHESIA:   general  EBL:  Total I/O In: 100 [IV Piggyback:100] Out: 10 [Blood:10]  BLOOD ADMINISTERED:none  COUNT: Correct per nursing staff  DICTATION: Patient was brought to the operating room, placed under general endotracheal anesthesia. AP and lateral C-arm fluoroscopy units were set up, and the T3, T4, and T6 vertebra were identified. The upper thoracic region posteriorly was prepped with DuraPrep, and draped in a sterile fashion. The C-arm fluoroscopy units were also draped in a sterile fashion. The entry points to approach the posterior lateral aspect of the T3, T4, and T6 pedicles were identified bilaterally.  Kyphoplasties were first performed at the T3 and T4 levels, and then the C arm fluoroscopy units were adjusted for the T6 level.  The skin and subcutaneous tissue were infiltrated with local anesthetic with epinephrine at each of the 6 entry points. 3 mm incisions were made bilaterally at the entry points. The express (10-gauge) cannulated trocar was passed down to the posterior lateral entry point of the pedicles, on each side. Each trocar was passed through the pedicle with C-arm fluoroscopic guidance into the vertebral body on either side. We then removed the inner trocar, leaving the outer cannula. Using the drill we created a passage through the vertebral body, to the ventral wall. The balloon expanders were placed in the vertebral body on either side, and gently inflated and expanded under  C-arm fluoroscopic guidance. Once good compaction was achieved bilaterally, the balloon expanders were deflated, and we began to fill the cavities that had been created with methylmethacrylate. We filled from one side to the other, using C-arm fluoroscopic guidance, until good filling of the cavity, and good interdigitation with the bone was achieved bilaterally.  Filling at the T3 level was limited to a small amount of methylmethacrylate extending lateral to the vertebral body, left more so than right.  At T3, 1 cc of methylmethacrylate was introduced on each side, for a total of 2 cc.  At T4, 1.5 cc of methylmethacrylate was introduced on each side, for a total of 3 cc.  At T6, 2 cc of methylmethacrylate was introduced on each side, for a total of 4 cc.  We then carefully removed each of the cannulas, using C-arm for guidance. Final imaging showed good filling of the cavity, with mild lateral extravasation, bilaterally, at T3. Each of the incisions was closed with a single 4-0 undyed Vicryl subcuticular suture, and Steri-Strips.  A honeycomb dressing was applied.  Following surgery the patient is to be turned back to a supine position, reversed from the anesthetic, extubated, and transferred to the recovery room for further care.  PLAN OF CARE: Will be assessed in the PACU, and considered for discharge directly home or admitted for outpatient observation until later today.  PATIENT DISPOSITION:  PACU - hemodynamically stable.   Delay start of Pharmacological VTE agent (>24hrs) due to surgical blood loss or risk of bleeding:  yes

## 2020-03-16 NOTE — H&P (Signed)
Subjective: Patient is a 79 y.o. right-handed white female who is admitted for treatment of multiple thoracic wedge compression fractures, T3, T4, and T6.  Patient fell 2 weeks ago, and has been having steadily worsening pain in the upper back.  She has been using ibuprofen and Tylenol without relief.  Fractures were confirmed by x-ray and MRI and documented as acute.  We have discussed alternatives of continued symptomatic management versus kyphoplasty.  Because of the persistent pain and limitations that the pain causes patient wishes to proceed with kyphoplasty and is admitted for such.  Patient Active Problem List   Diagnosis Date Noted  . Lumbar stenosis with neurogenic claudication 07/31/2018  . Hereditary and idiopathic peripheral neuropathy 06/05/2016  . Abnormality of gait 06/05/2016  . Family history of breast cancer in female 02/03/2016  . Family history of ovarian cancer 02/03/2016  . Peripheral neuropathy 12/16/2015  . Breast cancer of upper-outer quadrant of left female breast ([place]) 12/12/2015  . Acute medial meniscal tear 04/12/2015  . Fibromyalgia 02/02/2015  . Tachycardia   . Diastolic dysfunction   . Essential hypertension, benign 03/31/2014  . Other specified cardiac dysrhythmias(427.89) 03/31/2014   Past Medical History:  Diagnosis Date  . Allergy   . Arthritis   . Bladder infection    hx of last one approx 5 years ago   . Breast cancer ([place] [facility]) 10/25/15   left breast  ADH   . Cancer ([facility])    breast  . Complication of anesthesia    difficulty awakening   . Diastolic dysfunction   . Dyspnea    due to pain  . Fibromyalgia   . GERD (gastroesophageal reflux disease)    Tums if needed  . 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 Hypertension   . Hypothyroidism   . Obesity   . PONV (postoperative nausea and vomiting)   . Reflux    buring and dysphagia sx resp to PPI; egd 3/06 neg for Barretts  . 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 Yeast infection    hx of     Past Surgical History:  Procedure Laterality Date  . AXILLARY LYMPH NODE BIOPSY Left 12/27/2015   Procedure: LEFT AXILLARY SENTINEL LYMPH NODE BIOPSY;  Surgeon: [name], MD;  Location: [facility];  Service: General;  Laterality: Left;  . BELPHAROPTOSIS REPAIR Bilateral   . BREAST LUMPECTOMY WITH RADIOACTIVE SEED LOCALIZATION Left 12/01/2015   Procedure: LEFT BREAST [place] RADIOACTIVE SEED LOCALIZATION;  Surgeon: [name], MD;  Location: [facility];  Service: General;  Laterality: Left;  . CATARACT EXTRACTION, BILATERAL Bilateral 08/2018  . COLONOSCOPY  3/06   NEG  . cortisone injections     right arm; back  . EYE SURGERY     cosmetic; eyelid surgery bilat   . FINGER SURGERY Left    left index finger/ cyst 02/2007  . KNEE ARTHROSCOPY Right 04/13/2015   Procedure: ARTHROSCOPY RIGHT KNEE WITH MEDIAL MENISCAL DEBRIDEMENT AND CHONDROPLASTY;  Surgeon: Gaynelle Arabian, MD;  Location: [facility];  Service: Orthopedics;  Laterality: Right;  . left rotator cuff repair Left    12/2006  . LUMBAR LAMINECTOMY    . TONSILLECTOMY      Medications Prior to Admission  Medication Sig Dispense Refill Last Dose  . Calcium Carb-Cholecalciferol (CALCIUM + D3 PO) Take 1 each by mouth daily. 500 vit D   03/15/2020 at Unknown time  . cholecalciferol (VITAMIN D) 25 MCG (  1000 UNIT) tablet Take 1,000 Units by mouth daily.    03/15/2020 at Unknown time  . Co-Enzyme Q10 100 MG CAPS Take 100 mg by mouth daily.    03/15/2020 at Unknown time  . CRANBERRY PO Take 4,200 mg by mouth 2 (two) times daily.    03/15/2020 at Unknown time  . Cyanocobalamin (VITAMIN B-12) 5000 MCG SUBL Place 5,000 mcg under the tongue daily.    03/15/2020 at Unknown time  . cycloSPORINE (RESTASIS) 0.05 % ophthalmic emulsion Place 1 drop into both eyes daily as needed (Dry eyes).    Past Month at Unknown time  . esomeprazole (NEXIUM) 20 MG packet Take 20 mg by mouth  daily before breakfast.   03/16/2020 at 0530  . gabapentin (NEURONTIN) 300 MG capsule ONE CAPSULE TWICE DURING THE DAY AND 2 AT NIGHT (Patient taking differently: Take 300-600 mg by mouth See admin instructions. Take 300 mg in the morning and at lunch and 600 mg at bedtime) 360 capsule 3 03/16/2020 at 0600  . Glucos-Chondroit-Hyaluron-MSM (GLUCOSAMINE CHONDROITIN JOINT PO) Take 1 tablet by mouth 2 (two) times daily.    03/15/2020 at Unknown time  . hydrochlorothiazide (MICROZIDE) 12.5 MG capsule Take 1 capsule (12.5 mg total) by mouth daily. 90 capsule 1 03/16/2020 at 0600  . levothyroxine (SYNTHROID, LEVOTHROID) 25 MCG tablet Take 25 mcg by mouth 4 (four) times a week. Mon. Wed and Friday and one on the weekend   03/14/2020 at Unknown time  . loratadine (CLARITIN) 10 MG tablet Take 10 mg by mouth daily as needed for allergies.    Past Month at Unknown time  . magnesium oxide (MAG-OX) 400 MG tablet Take 200 mg by mouth daily.   03/15/2020 at Unknown time  . Multiple Vitamins-Minerals (PRESERVISION AREDS 2) CAPS Take 1 tablet by mouth 2 (two) times daily.    03/15/2020 at Unknown time  . nebivolol (BYSTOLIC) 5 MG tablet Take 5 mg by mouth daily.   03/16/2020 at 0600  . Omega 3 1200 MG CAPS Take 1,280 mg by mouth 2 (two) times daily.   03/15/2020 at Unknown time  . Polyethyl Glyc-Propyl Glyc PF (SYSTANE ULTRA PF) 0.4-0.3 % SOLN Place 1 drop into both eyes daily.   03/16/2020 at 0600  . pyridOXINE (VITAMIN B-6) 100 MG tablet Take 100 mg by mouth daily.   03/15/2020 at Unknown time  . naproxen sodium (ALEVE) 220 MG tablet Take 220 mg by mouth daily.   More than a month at Unknown time   Allergies  Allergen Reactions  . Contrast Media [Iodinated Diagnostic Agents] Shortness Of Breath  . Other Swelling    Cat gut sutures Cats and dogs                Derma bond- intolerance- itching- tolerates with Benadryl      . Cymbalta [Duloxetine Hcl]     Diarrhea  . Wheat Bran Other (See Comments)    Sneeze  .  Macrodantin [Nitrofurantoin] Rash    Social History   Tobacco Use  . Smoking status: Never Smoker  . Smokeless tobacco: Never Used  Substance Use Topics  . Alcohol use: No    Family History  Problem Relation Age of Onset  . Diabetes Mother   . Breast cancer Mother        dx. late 23s-early 46s; "metastasis to throat"?  . Hodgkin's lymphoma Father 55  . Breast cancer Sister        maternal half-sister dx. w/ DCIS in her  late 18s;  . Other Sister        learning disabilities; lives on her own  . Diabetes Sister   . Breast cancer Maternal Aunt        dx. 6s  . Ovarian cancer Maternal Aunt 48  . Breast cancer Paternal Aunt        dx. early 62s  . Stroke Maternal Grandfather   . Depression Paternal Grandmother   . Heart Problems Paternal Grandmother   . Alcohol abuse Brother   . Skin cancer Daughter        dx. early 89s  . Other Daughter        insterstitial cystitis  . Heart Problems Paternal Aunt      Review of Systems Pertinent items noted in HPI and remainder of comprehensive ROS otherwise negative.  Objective: Vital signs in last 24 hours: Temp:  [99 F (37.2 C)-100 F (37.8 C)] 99 F (37.2 C) (04/21 0800) Pulse Rate:  [88] 88 (04/21 0722) Resp:  [18] 18 (04/21 0722) BP: (157)/(78) 157/78 (04/21 0722) SpO2:  [98 %] 98 % (04/21 0722) Weight:  [78 kg] 78 kg (04/21 0722)  EXAM: Patient is a well-developed well-nourished white female in discomfort, but no acute distress.   Lungs are clear to auscultation , the patient has symmetrical respiratory excursion. Heart has a regular rate and rhythm normal S1 and S2 no murmur.   Abdomen is soft nontender nondistended bowel sounds are present. Extremity examination shows no clubbing cyanosis or edema. Motor examination shows 5 over 5 strength in the lower extremities including the iliopsoas quadriceps dorsiflexor extensor hallicus  longus and plantar flexor bilaterally. Sensation is intact to pinprick in the distal lower  extremities. Reflexes are symmetrical bilaterally. No pathologic reflexes are present. Patient has a normal gait and stance.  Data Review:CBC    Component Value Date/Time   WBC 10.2 02/10/2020 1246   RBC 4.81 02/10/2020 1246   HGB 14.9 02/10/2020 1246   HGB 13.9 11/30/2016 1358   HCT 42.4 02/10/2020 1246   HCT 39.0 11/30/2016 1358   PLT 449 (H) 02/10/2020 1246   PLT 332 11/30/2016 1358   MCV 88.1 02/10/2020 1246   MCV 86.1 11/30/2016 1358   MCH 31.0 02/10/2020 1246   MCHC 35.1 02/10/2020 1246   RDW 12.9 02/10/2020 1246   RDW 13.5 11/30/2016 1358   LYMPHSABS 1.7 02/10/2020 1246   LYMPHSABS 1.6 11/30/2016 1358   MONOABS 0.9 02/10/2020 1246   MONOABS 0.5 11/30/2016 1358   EOSABS 0.0 02/10/2020 1246   EOSABS 0.1 11/30/2016 1358   BASOSABS 0.1 02/10/2020 1246   BASOSABS 0.0 11/30/2016 1358                          BMET    Component Value Date/Time   NA 138 02/10/2020 1246   NA 141 11/30/2016 1358   K 3.1 (L) 02/10/2020 1246   K 3.5 11/30/2016 1358   CL 98 02/10/2020 1246   CO2 30 02/10/2020 1246   CO2 29 11/30/2016 1358   GLUCOSE 96 02/10/2020 1246   GLUCOSE 103 11/30/2016 1358   BUN 11 02/10/2020 1246   BUN 14.2 11/30/2016 1358   CREATININE 0.84 02/10/2020 1246   CREATININE 0.8 11/30/2016 1358   CALCIUM 9.5 02/10/2020 1246   CALCIUM 9.5 11/30/2016 1358   GFRNONAA >60 02/10/2020 1246   GFRAA >60 02/10/2020 1246     Assessment/Plan: Patient with acute upper back pain secondary to  multiple acute wedge compression fractures, T3, T4, and T6.  Patient is admitted now for 3 level, T3, T4, and T6 kyphoplasty.  I've discussed with the patient the nature of her condition, the nature the surgical procedure, the typical length of surgery, hospital stay, and overall recuperation. We discussed limitations postoperatively. I discussed risks of surgery including risks of infection, bleeding, possibly need for transfusion, the risk of nerve root dysfunction with pain, weakness,  numbness, or paresthesias, the risk of spinal cord dysfunction with paralysis of her lower extremities, the risk of dural tear and CSF leakage and possible need for further surgery, and the risk of anesthetic complications including myocardial infarction, stroke, pneumonia, and death. Understanding all this the patient does wish to proceed with surgery and is admitted for such.  Hosie Spangle, MD 03/16/2020 8:12 AM

## 2020-03-16 NOTE — Transfer of Care (Signed)
Immediate Anesthesia Transfer of Care Note  Patient: Audrey [name]  Procedure(s) Performed: KYPHOPLASTY THORACIC THREE, THORACIC FOUR, THORACIC SIX (N/A Back)  Patient Location: PACU  Anesthesia Type:General  Level of Consciousness: drowsy  Airway & Oxygen Therapy: Patient Spontanous Breathing  Post-op Assessment: Report given to RN and Post -op Vital signs reviewed and stable  Post vital signs: Reviewed and stable  Last Vitals:  Vitals Value Taken Time  BP 145/81 03/16/20 1057  Temp    Pulse 78 03/16/20 1059  Resp 17 03/16/20 1059  SpO2 93 % 03/16/20 1059  Vitals shown include unvalidated device data.  Last Pain:  Vitals:   03/16/20 0800  TempSrc: Oral  PainSc:       Patients Stated Pain Goal: 2 ([id number] [phone])  Complications: No apparent anesthesia complications

## 2020-03-21 NOTE — Anesthesia Postprocedure Evaluation (Signed)
Anesthesia Post Note  Patient: Audrey [name]  Procedure(s) Performed: KYPHOPLASTY THORACIC THREE, THORACIC FOUR, THORACIC SIX (N/A Back)     Patient location during evaluation: PACU Anesthesia Type: General Level of consciousness: awake and alert Pain management: pain level controlled Vital Signs Assessment: post-procedure vital signs reviewed and stable Respiratory status: spontaneous breathing, nonlabored ventilation, respiratory function stable and patient connected to nasal cannula oxygen Cardiovascular status: blood pressure returned to baseline and stable Postop Assessment: no apparent nausea or vomiting Anesthetic complications: no    Last Vitals:  Vitals:   03/16/20 1214 03/16/20 1626  BP: 136/81 128/67  Pulse: 73 75  Resp: 18 18  Temp: 36.7 C 36.7 C  SpO2: 98% 96%    Last Pain:  Vitals:   03/16/20 1626  TempSrc: Oral  PainSc:                  Avalin Briley

## 2020-03-25 DIAGNOSIS — S22000A Wedge compression fracture of unspecified thoracic vertebra, initial encounter for closed fracture: Secondary | ICD-10-CM | POA: Diagnosis not present

## 2020-03-31 DIAGNOSIS — N3090 Cystitis, unspecified without hematuria: Secondary | ICD-10-CM | POA: Diagnosis not present

## 2020-03-31 DIAGNOSIS — E876 Hypokalemia: Secondary | ICD-10-CM | POA: Diagnosis not present

## 2020-03-31 DIAGNOSIS — R300 Dysuria: Secondary | ICD-10-CM | POA: Diagnosis not present

## 2020-03-31 DIAGNOSIS — I10 Essential (primary) hypertension: Secondary | ICD-10-CM | POA: Diagnosis not present

## 2020-04-28 DIAGNOSIS — I10 Essential (primary) hypertension: Secondary | ICD-10-CM | POA: Diagnosis not present

## 2020-05-05 DIAGNOSIS — Z9889 Other specified postprocedural states: Secondary | ICD-10-CM | POA: Diagnosis not present

## 2020-05-26 ENCOUNTER — Other Ambulatory Visit: Payer: Self-pay | Admitting: Adult Health

## 2020-06-01 DIAGNOSIS — R300 Dysuria: Secondary | ICD-10-CM | POA: Diagnosis not present

## 2020-06-03 DIAGNOSIS — C50912 Malignant neoplasm of unspecified site of left female breast: Secondary | ICD-10-CM | POA: Diagnosis not present

## 2020-06-22 DIAGNOSIS — R300 Dysuria: Secondary | ICD-10-CM | POA: Diagnosis not present

## 2020-07-05 DIAGNOSIS — R300 Dysuria: Secondary | ICD-10-CM | POA: Diagnosis not present

## 2020-07-05 DIAGNOSIS — H6090 Unspecified otitis externa, unspecified ear: Secondary | ICD-10-CM | POA: Diagnosis not present

## 2020-07-05 DIAGNOSIS — E876 Hypokalemia: Secondary | ICD-10-CM | POA: Diagnosis not present

## 2020-07-05 DIAGNOSIS — N3090 Cystitis, unspecified without hematuria: Secondary | ICD-10-CM | POA: Diagnosis not present

## 2020-07-14 ENCOUNTER — Ambulatory Visit: Payer: PPO | Admitting: Adult Health

## 2020-08-02 DIAGNOSIS — N3020 Other chronic cystitis without hematuria: Secondary | ICD-10-CM | POA: Diagnosis not present

## 2020-08-05 DIAGNOSIS — Z961 Presence of intraocular lens: Secondary | ICD-10-CM | POA: Diagnosis not present

## 2020-08-05 DIAGNOSIS — H353212 Exudative age-related macular degeneration, right eye, with inactive choroidal neovascularization: Secondary | ICD-10-CM | POA: Diagnosis not present

## 2020-08-05 DIAGNOSIS — H52203 Unspecified astigmatism, bilateral: Secondary | ICD-10-CM | POA: Diagnosis not present

## 2020-08-05 DIAGNOSIS — D3132 Benign neoplasm of left choroid: Secondary | ICD-10-CM | POA: Diagnosis not present

## 2020-08-16 DIAGNOSIS — M8000XA Age-related osteoporosis with current pathological fracture, unspecified site, initial encounter for fracture: Secondary | ICD-10-CM | POA: Diagnosis not present

## 2020-08-16 DIAGNOSIS — Z853 Personal history of malignant neoplasm of breast: Secondary | ICD-10-CM | POA: Diagnosis not present

## 2020-08-16 DIAGNOSIS — M797 Fibromyalgia: Secondary | ICD-10-CM | POA: Diagnosis not present

## 2020-08-16 DIAGNOSIS — I10 Essential (primary) hypertension: Secondary | ICD-10-CM | POA: Diagnosis not present

## 2020-08-16 DIAGNOSIS — E7800 Pure hypercholesterolemia, unspecified: Secondary | ICD-10-CM | POA: Diagnosis not present

## 2020-08-16 DIAGNOSIS — E559 Vitamin D deficiency, unspecified: Secondary | ICD-10-CM | POA: Diagnosis not present

## 2020-08-16 DIAGNOSIS — I700 Atherosclerosis of aorta: Secondary | ICD-10-CM | POA: Diagnosis not present

## 2020-08-16 DIAGNOSIS — Z23 Encounter for immunization: Secondary | ICD-10-CM | POA: Diagnosis not present

## 2020-08-16 DIAGNOSIS — M5136 Other intervertebral disc degeneration, lumbar region: Secondary | ICD-10-CM | POA: Diagnosis not present

## 2020-08-16 DIAGNOSIS — E039 Hypothyroidism, unspecified: Secondary | ICD-10-CM | POA: Diagnosis not present

## 2020-08-16 DIAGNOSIS — Z1389 Encounter for screening for other disorder: Secondary | ICD-10-CM | POA: Diagnosis not present

## 2020-08-16 DIAGNOSIS — Z0000 Encounter for general adult medical examination without abnormal findings: Secondary | ICD-10-CM | POA: Diagnosis not present

## 2020-08-29 DIAGNOSIS — N3020 Other chronic cystitis without hematuria: Secondary | ICD-10-CM | POA: Diagnosis not present

## 2020-10-06 DIAGNOSIS — M5416 Radiculopathy, lumbar region: Secondary | ICD-10-CM | POA: Diagnosis not present

## 2020-10-10 NOTE — Progress Notes (Signed)
PATIENT: Audrey [name] DOB: 02/18/1941  REASON FOR VISIT: follow up HISTORY FROM: patient  HISTORY OF PRESENT ILLNESS: Today 10/10/20:  Ms. Migliaccio is a 79 year old female with a history of peripheral neuropathy.  She returns today for follow-up.  She states that there are some nights she is unable to sleep due to her neuropathy.  She states that most the time it feels as if her feet are itching.  This has always been how her neuropathy presented.  She does not have any significant pain associated with her neuropathy.  She reports that she adjusted her gabapentin and some nights will take 4 to 5 tablets at bedtime.  She reports that the increase in gabapentin was helpful.  She is currently taking gabapentin 300 mg in the morning and 1200 to 1500 mg at bedtime.  She denies any side effects from gabapentin.  Denies any changes with her gait or balance.  She does report back in March she tripped and fractured her neck and had to have surgery.  Since then she is not had any additional falls.  She returns today for an evaluation.  HISTORY 07/09/19:  Ms. Zaucha is a 79 year old female with a history of peripheral neuropathy.  She returns today for follow-up.  She continues to take gabapentin 300 mg in the morning and at noon and 600 mg at bedtime.  She states that this continues to work well for her.  Over the last 6 months she has had 3 episodes where she will have a exacerbation of pain in her feet.  She describes it as a sharp shooting pain that can last for a while.  She states when this does happen she will take an extra gabapentin and it resolves the pain.  She denies any significant changes with her gait or balance.  She states that her balance is not as good as it was because she is not able to go to yoga class to the pandemic.  She denies any new symptoms.  She returns today for an evaluation.  REVIEW OF SYSTEMS: Out of a complete 14 system review of symptoms, the patient complains only  of the following symptoms, and all other reviewed systems are negative.  See HPI  ALLERGIES: Allergies  Allergen Reactions  . Contrast Media [Iodinated Diagnostic Agents] Shortness Of Breath  . Other Swelling    Cat gut sutures Cats and dogs                Derma bond- intolerance- itching- tolerates with Benadryl      . Cymbalta [Duloxetine Hcl]     Diarrhea  . Macrodantin [Nitrofurantoin] Rash    HOME MEDICATIONS: Outpatient Medications Prior to Visit  Medication Sig Dispense Refill  . Calcium Carb-Cholecalciferol (CALCIUM + D3 PO) Take 1 each by mouth daily. 500 vit D    . cholecalciferol (VITAMIN D) 25 MCG (1000 UNIT) tablet Take 1,000 Units by mouth daily.     Marland Kitchen Co-Enzyme Q10 100 MG CAPS Take 100 mg by mouth daily.     Marland Kitchen CRANBERRY PO Take 4,200 mg by mouth 2 (two) times daily.     . Cyanocobalamin (VITAMIN B-12) 5000 MCG SUBL Place 5,000 mcg under the tongue daily.     . cycloSPORINE (RESTASIS) 0.05 % ophthalmic emulsion Place 1 drop into both eyes daily as needed (Dry eyes).     Marland Kitchen esomeprazole (NEXIUM) 20 MG packet Take 20 mg by mouth daily before breakfast.    . gabapentin (NEURONTIN)  300 MG capsule Take 1-2 capsules (300-600 mg total) by mouth See admin instructions. Take 300 mg in the morning and at lunch and 600 mg at bedtime 360 capsule 1  . Glucos-Chondroit-Hyaluron-MSM (GLUCOSAMINE CHONDROITIN JOINT PO) Take 1 tablet by mouth 2 (two) times daily.     . hydrochlorothiazide (MICROZIDE) 12.5 MG capsule Take 1 capsule (12.5 mg total) by mouth daily. 90 capsule 1  . HYDROcodone-acetaminophen (NORCO/VICODIN) 5-325 MG tablet Take 1 tablet by mouth every 4 (four) hours as needed (pain). 20 tablet 0  . levothyroxine (SYNTHROID, LEVOTHROID) 25 MCG tablet Take 25 mcg by mouth 4 (four) times a week. Mon. Wed and Friday and one on the weekend    . loratadine (CLARITIN) 10 MG tablet Take 10 mg by mouth daily as needed for allergies.     . magnesium oxide (MAG-OX) 400 MG tablet Take  200 mg by mouth daily.    . Multiple Vitamins-Minerals (PRESERVISION AREDS 2) CAPS Take 1 tablet by mouth 2 (two) times daily.     . naproxen sodium (ALEVE) 220 MG tablet Take 220 mg by mouth daily.    . nebivolol (BYSTOLIC) 5 MG tablet Take 5 mg by mouth daily.    . Omega 3 1200 MG CAPS Take 1,280 mg by mouth 2 (two) times daily.    Vladimir Faster Glyc-Propyl Glyc PF (SYSTANE ULTRA PF) 0.4-0.3 % SOLN Place 1 drop into both eyes daily.    Marland Kitchen pyridOXINE (VITAMIN B-6) 100 MG tablet Take 100 mg by mouth daily.     No facility-administered medications prior to visit.    PAST MEDICAL HISTORY: Past Medical History:  Diagnosis Date  . Allergy   . Arthritis   . Bladder infection    hx of last one approx 5 years ago   . Breast cancer ([place]) 10/25/15   left breast  ADH   . Cancer ([place])    breast  . Complication of anesthesia    difficulty awakening   . Diastolic dysfunction   . Dyspnea    due to pain  . Fibromyalgia   . GERD (gastroesophageal reflux disease)    Tums if needed  . 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 Hypertension   . Hypothyroidism   . Obesity   . PONV (postoperative nausea and vomiting)   . Reflux    buring and dysphagia sx resp to PPI; egd 3/06 neg for Barretts  . 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 Yeast infection    hx of     PAST SURGICAL HISTORY: Past Surgical History:  Procedure Laterality Date  . AXILLARY LYMPH NODE BIOPSY Left 12/27/2015   Procedure: LEFT AXILLARY SENTINEL LYMPH NODE BIOPSY;  Surgeon: [name], MD;  Location: [facility];  Service: General;  Laterality: Left;  . BELPHAROPTOSIS REPAIR Bilateral   . BREAST LUMPECTOMY WITH RADIOACTIVE SEED LOCALIZATION Left 12/01/2015   Procedure: LEFT BREAST [place] RADIOACTIVE SEED LOCALIZATION;  Surgeon: [name], MD;  Location: [facility];  Service: General;  Laterality: Left;  . CATARACT EXTRACTION, BILATERAL  Bilateral 08/2018  . COLONOSCOPY  3/06   NEG  . cortisone injections     right arm; back  . EYE SURGERY     cosmetic; eyelid surgery bilat   . FINGER SURGERY Left    left index finger/ cyst 02/2007  . KNEE ARTHROSCOPY Right 04/13/2015   Procedure: ARTHROSCOPY RIGHT KNEE WITH MEDIAL MENISCAL DEBRIDEMENT AND CHONDROPLASTY;  Surgeon: Gaynelle Arabian, MD;  Location: [facility];  Service: Orthopedics;  Laterality: Right;  . KYPHOPLASTY N/A 03/16/2020   Procedure: KYPHOPLASTY THORACIC THREE, THORACIC FOUR, THORACIC SIX;  Surgeon: Jovita Gamma, MD;  Location: [facility];  Service: Neurosurgery;  Laterality: N/A;  . left rotator cuff repair Left    12/2006  . LUMBAR LAMINECTOMY    . TONSILLECTOMY      FAMILY HISTORY: Family History  Problem Relation Age of Onset  . Diabetes Mother   . Breast cancer Mother        dx. late 47s-early 57s; "metastasis to throat"?  . Hodgkin's lymphoma Father 65  . Breast cancer Sister        maternal half-sister dx. w/ DCIS in her late 48s;  . Other Sister        learning disabilities; lives on her own  . Diabetes Sister   . Breast cancer Maternal Aunt        dx. 37s  . Ovarian cancer Maternal Aunt 63  . Breast cancer Paternal Aunt        dx. early 34s  . Stroke Maternal Grandfather   . Depression Paternal Grandmother   . Heart Problems Paternal Grandmother   . Alcohol abuse Brother   . Skin cancer Daughter        dx. early 63s  . Other Daughter        insterstitial cystitis  . Heart Problems Paternal Aunt     SOCIAL HISTORY: Social History   Socioeconomic History  . Marital status: Married    Spouse name: Not on file  . Number of children: 2  . Years of education: 4  . Highest education level: Not on file  Occupational History  . Occupation: retired  Tobacco Use  . Smoking status: Never Smoker  . Smokeless tobacco: Never Used  Vaping Use  . Vaping Use: Never used  Substance and Sexual Activity  . Alcohol use: No  . Drug use: Yes     Comment: CBD- last time - 03/15/2020  . Sexual activity: Not on file  Other Topics Concern  . Not on file  Social History Narrative   Patient is right handed.   Patient drinks 2-3 cups of caffeine per day.   Social Determinants of Health   Financial Resource Strain:   . Difficulty of Paying Living Expenses: Not on file  Food Insecurity:   . Worried About Charity fundraiser in the Last Year: Not on file  . Ran Out of Food in the Last Year: Not on file  Transportation Needs:   . Lack of Transportation (Medical): Not on file  . Lack of Transportation (Non-Medical): Not on file  Physical Activity:   . Days of Exercise per Week: Not on file  . Minutes of Exercise per Session: Not on file  Stress:   . Feeling of Stress : Not on file  Social Connections:   . Frequency of Communication with Friends and Family: Not on file  . Frequency of Social Gatherings with Friends and Family: Not on file  . Attends Religious Services: Not on file  . Active Member of Clubs or Organizations: Not on file  . Attends Archivist Meetings: Not on file  . Marital Status: Not on file  Intimate Partner Violence:   . Fear of Current or Ex-Partner: Not on file  . Emotionally Abused: Not on file  . Physically Abused: Not on file  . Sexually Abused: Not on file  PHYSICAL EXAM  Vitals:   10/11/20 1252  BP: 129/74  Pulse: 71  Weight: 173 lb 6.4 oz (78.7 kg)  Height: 4\' 10"  (1.473 m)   Body mass index is 36.24 kg/m.  Generalized: Well developed, in no acute distress   Neurological examination  Mentation: Alert oriented to time, place, history taking. Follows all commands speech and language fluent Cranial nerve II-XII: Pupils were equal round reactive to light. Extraocular movements were full, visual field were full on confrontational test. Facial sensation and strength were normal. Uvula tongue midline. Head turning and shoulder shrug  were normal and symmetric. Motor: The motor  testing reveals 5 over 5 strength of all 4 extremities. Good symmetric motor tone is noted throughout.  Sensory: Sensory testing is intact to soft touch on all 4 extremities. No evidence of extinction is noted.  Coordination: Cerebellar testing reveals good finger-nose-finger and heel-to-shin bilaterally.  Gait and station: Gait is normal. Tandem gait is normal. Romberg is negative. No drift is seen.  Reflexes: Deep tendon reflexes are symmetric and normal bilaterally.   DIAGNOSTIC DATA (LABS, IMAGING, TESTING) - I reviewed patient records, labs, notes, testing and imaging myself where available.  Lab Results  Component Value Date   WBC 10.4 03/16/2020   HGB 14.1 03/16/2020   HCT 40.9 03/16/2020   MCV 88.0 03/16/2020   PLT 446 (H) 03/16/2020      Component Value Date/Time   NA 137 03/16/2020 0717   NA 141 11/30/2016 1358   K 2.8 (L) 03/16/2020 0717   K 3.5 11/30/2016 1358   CL 98 03/16/2020 0717   CO2 24 03/16/2020 0717   CO2 29 11/30/2016 1358   GLUCOSE 97 03/16/2020 0717   GLUCOSE 103 11/30/2016 1358   BUN 9 03/16/2020 0717   BUN 14.2 11/30/2016 1358   CREATININE 0.78 03/16/2020 0717   CREATININE 0.8 11/30/2016 1358   CALCIUM 9.5 03/16/2020 0717   CALCIUM 9.5 11/30/2016 1358   PROT 6.8 03/16/2020 0717   PROT 7.1 11/30/2016 1358   ALBUMIN 3.5 03/16/2020 0717   ALBUMIN 4.0 11/30/2016 1358   AST 20 03/16/2020 0717   AST 25 11/30/2016 1358   ALT 17 03/16/2020 0717   ALT 20 11/30/2016 1358   ALKPHOS 102 03/16/2020 0717   ALKPHOS 71 11/30/2016 1358   BILITOT 0.9 03/16/2020 0717   BILITOT 0.55 11/30/2016 1358   GFRNONAA >60 03/16/2020 0717   GFRAA >60 03/16/2020 0717      ASSESSMENT AND PLAN 79 y.o. year old female  has a past medical history of Allergy, Arthritis, Bladder infection, Breast cancer ([place]) (10/25/15), Cancer ([place]), Complication of anesthesia, Diastolic dysfunction, Dyspnea, Fibromyalgia, GERD (gastroesophageal reflux disease), Hereditary and idiopathic  peripheral neuropathy (02/10/2015), Hypertension, Hypothyroidism, Obesity, PONV (postoperative nausea and vomiting), Reflux, Tachycardia ( ), and Yeast infection. here with:  1.  Peripheral neuropathy  -Advised that she should take gabapentin 300 mg in the morning and 1200 mg at bedtime. -I will order compounded cream that she can use at bedtime.  If this is beneficial we will slowly reduce her dose of gabapentin -Advised if symptoms worsen or she develops new symptoms she should let us know -Follow-up in 6 months or sooner if needed  I spent 20 minutes of face-to-face and non-face-to-face time with patient.  This included previsit chart review, lab review, study review, order entry, electronic health record documentation, patient education.  Ward Givens, MSN, NP-C 10/10/2020, 5:18 PM [facility] [address], [place], [place] [address] [phone]   

## 2020-10-11 ENCOUNTER — Ambulatory Visit: Payer: PPO | Admitting: Adult Health

## 2020-10-11 ENCOUNTER — Encounter: Payer: Self-pay | Admitting: Adult Health

## 2020-10-11 VITALS — BP 129/74 | HR 71 | Ht <= 58 in | Wt 173.4 lb

## 2020-10-11 DIAGNOSIS — G609 Hereditary and idiopathic neuropathy, unspecified: Secondary | ICD-10-CM

## 2020-10-11 MED ORDER — GABAPENTIN 300 MG PO CAPS: ORAL_CAPSULE | ORAL | 3 refills | Status: DC

## 2020-10-11 NOTE — Progress Notes (Signed)
I have read the note, and I agree with the clinical assessment and plan.  Daequan Kozma K Tyra Gural   

## 2020-10-11 NOTE — Patient Instructions (Addendum)
Your Plan:  Continue gabapentin I will order compound cream  Thank you for coming to see [place] at [facility]. I hope we have been able to provide you high quality care today.  You may receive a patient satisfaction survey over the next few weeks. We would appreciate your feedback and comments so that we may continue to improve ourselves and the health of our patients.

## 2020-10-27 DIAGNOSIS — M5416 Radiculopathy, lumbar region: Secondary | ICD-10-CM | POA: Diagnosis not present

## 2020-10-27 DIAGNOSIS — Z981 Arthrodesis status: Secondary | ICD-10-CM | POA: Diagnosis not present

## 2020-11-01 ENCOUNTER — Telehealth: Payer: Self-pay | Admitting: Hematology

## 2020-11-01 NOTE — Telephone Encounter (Signed)
Called pt per 12/6 sch msg - left message for patient to call back to reschedule appt.

## 2020-11-09 DIAGNOSIS — N3020 Other chronic cystitis without hematuria: Secondary | ICD-10-CM | POA: Diagnosis not present

## 2020-11-10 ENCOUNTER — Inpatient Hospital Stay: Payer: PPO | Attending: Hematology | Admitting: Hematology

## 2020-11-10 ENCOUNTER — Inpatient Hospital Stay: Payer: PPO

## 2020-11-22 DIAGNOSIS — M25552 Pain in left hip: Secondary | ICD-10-CM | POA: Diagnosis not present

## 2020-11-22 DIAGNOSIS — M25551 Pain in right hip: Secondary | ICD-10-CM | POA: Diagnosis not present

## 2020-11-22 DIAGNOSIS — M5416 Radiculopathy, lumbar region: Secondary | ICD-10-CM | POA: Diagnosis not present

## 2020-11-22 DIAGNOSIS — I10 Essential (primary) hypertension: Secondary | ICD-10-CM | POA: Diagnosis not present

## 2020-11-23 DIAGNOSIS — N3020 Other chronic cystitis without hematuria: Secondary | ICD-10-CM | POA: Diagnosis not present

## 2020-11-29 DIAGNOSIS — R110 Nausea: Secondary | ICD-10-CM | POA: Diagnosis not present

## 2020-11-29 DIAGNOSIS — R1310 Dysphagia, unspecified: Secondary | ICD-10-CM | POA: Diagnosis not present

## 2020-11-29 DIAGNOSIS — R1111 Vomiting without nausea: Secondary | ICD-10-CM | POA: Diagnosis not present

## 2020-11-29 DIAGNOSIS — I10 Essential (primary) hypertension: Secondary | ICD-10-CM | POA: Diagnosis not present

## 2020-11-29 DIAGNOSIS — R4702 Dysphasia: Secondary | ICD-10-CM | POA: Diagnosis not present

## 2020-12-05 DIAGNOSIS — N3020 Other chronic cystitis without hematuria: Secondary | ICD-10-CM | POA: Diagnosis not present

## 2020-12-07 DIAGNOSIS — M25551 Pain in right hip: Secondary | ICD-10-CM | POA: Diagnosis not present

## 2020-12-07 DIAGNOSIS — M5416 Radiculopathy, lumbar region: Secondary | ICD-10-CM | POA: Diagnosis not present

## 2020-12-07 DIAGNOSIS — M4326 Fusion of spine, lumbar region: Secondary | ICD-10-CM | POA: Diagnosis not present

## 2020-12-07 DIAGNOSIS — Z981 Arthrodesis status: Secondary | ICD-10-CM | POA: Diagnosis not present

## 2020-12-07 DIAGNOSIS — M7602 Gluteal tendinitis, left hip: Secondary | ICD-10-CM | POA: Diagnosis not present

## 2020-12-07 DIAGNOSIS — M7601 Gluteal tendinitis, right hip: Secondary | ICD-10-CM | POA: Diagnosis not present

## 2020-12-08 DIAGNOSIS — M48062 Spinal stenosis, lumbar region with neurogenic claudication: Secondary | ICD-10-CM | POA: Diagnosis not present

## 2020-12-08 DIAGNOSIS — M25552 Pain in left hip: Secondary | ICD-10-CM | POA: Diagnosis not present

## 2020-12-08 DIAGNOSIS — Z6834 Body mass index (BMI) 34.0-34.9, adult: Secondary | ICD-10-CM | POA: Diagnosis not present

## 2020-12-08 DIAGNOSIS — I10 Essential (primary) hypertension: Secondary | ICD-10-CM | POA: Diagnosis not present

## 2020-12-26 DIAGNOSIS — M48062 Spinal stenosis, lumbar region with neurogenic claudication: Secondary | ICD-10-CM | POA: Diagnosis not present

## 2020-12-29 ENCOUNTER — Telehealth: Payer: Self-pay | Admitting: Hematology

## 2020-12-29 NOTE — Telephone Encounter (Signed)
Rescheduled missed appointment per 2/2 schedule message. Patient is aware of changes.

## 2021-01-02 NOTE — Progress Notes (Signed)
[place]   Telephone:([phone] Fax:(336) [phone]   Clinic Follow up Note   Patient Care Team: Kelton Pillar, MD as PCP - General (Family Medicine) Sueanne Margarita, MD as Consulting Physician (Cardiology) Jodi Marble, MD as Consulting Physician (Otolaryngology) [name], MD as Consulting Physician (General Surgery) Truitt Merle, MD as Consulting Physician (Hematology) Kyung Rudd, MD as Consulting Physician (Radiation Oncology) Sylvan Cheese, NP as Nurse Practitioner (Hematology and Oncology) Aloha Gell, MD as Consulting Physician (Obstetrics and Gynecology)  Date of Service:  01/05/2021  CHIEF COMPLAINT: Follow up left breast cancer  SUMMARY OF ONCOLOGIC HISTORY: Oncology History Overview Note  Breast cancer of upper-outer quadrant of left female breast [facility])   Staging form: Breast, AJCC 7th Edition     Pathologic stage from 11/29/2015: Stage IA (T1c, N0, cM0) - Signed by Truitt Merle, MD on 01/14/2016     Clinical: Stage IA (T1c, N0, M0) - Unsigned      Breast cancer of upper-outer quadrant of left female breast ([place])  07/18/2014 Procedure   Hereditary cancer panel: no deleterious mutation at ATM, BARD1, BRCA1, BRCA2, BRIP1, CDH1, CHEK2, EPCAM, MLH1, MSH2, MSH6, NBN, PALB2, PMS2, PTEN, RAD51C, RAD51D, STK11, and TP53.   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   12/27/2015 Pathology Results   Second surgery with sentinel lymph  node biopsy showed no malignant cells in 5 lymph nodes   01/23/2016 - 02/17/2016 Radiation Therapy   Adjuvant left Breast radiation: Left breast treated to 42.5 Gy in 17 fractions, Left breast boost treated to 7.5 Gy in 3 fractions     Anti-estrogen oral therapy   She declined adjuvant AI or tamoxifen    05/03/2016 Survivorship   SCP visit completed   10/31/2017 Mammogram   IMPRESSION: There is no mammographic evidence of malignancy.       CURRENT THERAPY:  Surveillance  INTERVAL HISTORY:  Audrey [name] is here for a follow up of left breast cancer. She was last seen by me in 01/2020. She presents to the clinic alone.  She became ill in early Jan 2022, with nausea, low appetite, dehydration and memory loss for several days but did recover completely. She was seen by EMS at home but did not go to ED.  She has had multiple UTI, follow up with urologist.  NO new oain, chronic back pain is stable  She lost 10 lbs in past few month due to her illness in early Jan  She has chronic b/l hip pain for past 2 years, had MRI outdsdie    All other systems were reviewed with the patient and are negative.  MEDICAL HISTORY:  Past Medical History:  Diagnosis Date  . Allergy   . Arthritis   . Bladder infection    hx of last one approx 5 years ago   . Breast cancer ([place]) 10/25/15   left breast  ADH   . Cancer ([place])    breast  . Complication of anesthesia  difficulty awakening   . Diastolic dysfunction   . Dyspnea    due to pain  . Fibromyalgia   . GERD (gastroesophageal reflux disease)    Tums if needed  . 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 Hypertension   . Hypothyroidism   . Obesity   . PONV (postoperative nausea and vomiting)   . Reflux    buring and dysphagia sx resp to PPI; egd 3/06 neg for Barretts  . 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 Yeast infection    hx of     SURGICAL HISTORY: Past  Surgical History:  Procedure Laterality Date  . AXILLARY LYMPH NODE BIOPSY Left 12/27/2015   Procedure: LEFT AXILLARY SENTINEL LYMPH NODE BIOPSY;  Surgeon: [name], MD;  Location: [place];  Service: General;  Laterality: Left;  . BELPHAROPTOSIS REPAIR Bilateral   . BREAST LUMPECTOMY WITH RADIOACTIVE SEED LOCALIZATION Left 12/01/2015   Procedure: LEFT BREAST [place] RADIOACTIVE SEED LOCALIZATION;  Surgeon: [name], MD;  Location: [place];  Service: General;  Laterality: Left;  . CATARACT EXTRACTION, BILATERAL Bilateral 08/2018  . COLONOSCOPY  3/06   NEG  . cortisone injections     right arm; back  . EYE SURGERY     cosmetic; eyelid surgery bilat   . FINGER SURGERY Left    left index finger/ cyst 02/2007  . KNEE ARTHROSCOPY Right 04/13/2015   Procedure: ARTHROSCOPY RIGHT KNEE WITH MEDIAL MENISCAL DEBRIDEMENT AND CHONDROPLASTY;  Surgeon: Gaynelle Arabian, MD;  Location[facility];  Service: Orthopedics;  Laterality: Right;  . KYPHOPLASTY N/A 03/16/2020   Procedure: KYPHOPLASTY THORACIC THREE, THORACIC FOUR, THORACIC SIX;  Surgeon: Jovita Gamma, MD;  Location: [place];  Service: Neurosurgery;  Laterality: N/A;  . left rotator cuff repair Left    12/2006  . LUMBAR LAMINECTOMY    . TONSILLECTOMY      I have reviewed the social history and family history with the patient and they are unchanged from previous note.  ALLERGIES:  is allergic to contrast media [iodinated diagnostic agents], other, cymbalta [duloxetine hcl], and macrodantin [nitrofurantoin].  MEDICATIONS:  Current Outpatient Medications  Medication Sig Dispense Refill  . potassium chloride (KLOR-CON) 10 MEQ tablet Take 1 tablet (10 mEq total) by mouth daily. Take 2 tabs for one week then change to once daily 60 tablet 2  . Calcium Carb-Cholecalciferol (CALCIUM + D3 PO) Take 1 each by mouth daily. 500 vit D    . cholecalciferol (VITAMIN D) 25 MCG (1000 UNIT) tablet Take 1,000 Units by mouth daily.     Marland Kitchen  Co-Enzyme Q10 100 MG CAPS Take 100 mg by mouth daily.     Marland Kitchen CRANBERRY PO Take 4,200 mg by mouth 2 (two) times daily.     . Cyanocobalamin (VITAMIN B-12) 5000 MCG SUBL Place 5,000 mcg under the tongue daily.     Marland Kitchen gabapentin (NEURONTIN) 300 MG capsule Take 1 tablet in the morning and 4 tablets at bedtime 450 capsule 3  . Glucos-Chondroit-Hyaluron-MSM (GLUCOSAMINE CHONDROITIN JOINT PO) Take 1 tablet by mouth 2 (two) times daily.     . hydrochlorothiazide (MICROZIDE) 12.5 MG capsule Take 1 capsule (12.5 mg total) by mouth daily. 90 capsule 1  . HYDROcodone-acetaminophen (NORCO/VICODIN) 5-325 MG tablet Take 1 tablet by mouth every 4 (four) hours as needed (pain). 20 tablet 0  . levothyroxine (SYNTHROID, LEVOTHROID) 25 MCG tablet Take 25 mcg by mouth 4 (four) times a week. Mon.  Wed and Friday and one on the weekend    . loratadine (CLARITIN) 10 MG tablet Take 10 mg by mouth daily as needed for allergies.     . magnesium oxide (MAG-OX) 400 MG tablet Take 200 mg by mouth daily.    . Multiple Vitamins-Minerals (PRESERVISION AREDS 2) CAPS Take 1 tablet by mouth 2 (two) times daily.     . nebivolol (BYSTOLIC) 5 MG tablet Take 5 mg by mouth daily.    . Omega 3 1200 MG CAPS Take 1,280 mg by mouth 2 (two) times daily.    Vladimir Faster Glyc-Propyl Glyc PF (SYSTANE ULTRA PF) 0.4-0.3 % SOLN Place 1 drop into both eyes daily.    Marland Kitchen pyridOXINE (VITAMIN B-6) 100 MG tablet Take 100 mg by mouth daily.     No current facility-administered medications for this visit.    PHYSICAL EXAMINATION: ECOG PERFORMANCE STATUS: 0 - Asymptomatic  Vitals:   01/05/21 1330  BP: 132/74  Pulse: 80  Resp: 17  Temp: 98.7 F (37.1 C)  SpO2: 98%   Filed Weights   01/05/21 1330  Weight: 162 lb 14.4 oz (73.9 kg)    GENERAL:alert, no distress and comfortable SKIN: skin color, texture, turgor are normal, no rashes or significant lesions EYES: normal, Conjunctiva are pink and non-injected, sclera clear NECK: supple, thyroid  normal size, non-tender, without nodularity LYMPH:  no palpable lymphadenopathy in the cervical, axillary  LUNGS: clear to auscultation and percussion with normal breathing effort HEART: regular rate & rhythm and no murmurs and no lower extremity edema ABDOMEN:abdomen soft, non-tender and normal bowel sounds Musculoskeletal:no cyanosis of digits and no clubbing  NEURO: alert & oriented x 3 with fluent speech, no focal motor/sensory deficits Breasts: Breast inspection showed them to be symmetrical with no nipple discharge. Palpation of the breasts and axilla revealed no obvious mass that I could appreciate. (+) well healed surgical scar in UOQ of left breast    LABORATORY DATA:  I have reviewed the data as listed CBC Latest Ref Rng & Units 01/05/2021 03/16/2020 02/10/2020  WBC 4.0 - 10.5 K/uL 10.1 10.4 10.2  Hemoglobin 12.0 - 15.0 g/dL 14.1 14.1 14.9  Hematocrit 36.0 - 46.0 % 39.8 40.9 42.4  Platelets 150 - 400 K/uL 392 446(H) 449(H)     CMP Latest Ref Rng & Units 01/05/2021 03/16/2020 02/10/2020  Glucose 70 - 99 mg/dL 106(H) 97 96  BUN 8 - 23 mg/dL $Remove'16 9 11  'EdxnASm$ Creatinine 0.44 - 1.00 mg/dL 0.91 0.78 0.84  Sodium 135 - 145 mmol/L 139 137 138  Potassium 3.5 - 5.1 mmol/L 2.8(L) 2.8(L) 3.1(L)  Chloride 98 - 111 mmol/L 97(L) 98 98  CO2 22 - 32 mmol/L $RemoveB'29 24 30  'IBZciAeF$ Calcium 8.9 - 10.3 mg/dL 9.6 9.5 9.5  Total Protein 6.5 - 8.1 g/dL 6.9 6.8 7.0  Total Bilirubin 0.3 - 1.2 mg/dL 0.5 0.9 0.6  Alkaline Phos 38 - 126 U/L 66 102 75  AST 15 - 41 U/L $Remo'22 20 22  'OGfBJ$ ALT 0 - 44 U/L $Remo'21 17 18      'cAktB$ RADIOGRAPHIC STUDIES: I have personally reviewed the radiological images as listed and agreed with the findings in the report. No results found.   ASSESSMENT & PLAN:  Audrey [name] is a 80 y.o. female with   1. 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 She is s/p left breast lumpectomy, adjuvant  radiation. -Oncotyperevealedrecurrence score  7, which is a low risk, she would not benefit from adjuvant chemotherapy, so I did not recommend it.  -She declined anti-estrogen therapy.She is currently on surveillance.  -She is clinically doing well, her chronic back and hip pain are stable, and she had MRI a few months ago which showed OA -lab reviewed, exam unremarkable today  -No concern for cancer recurrence.  We reviewed the signs and right flags of cancer recurrence -Continue breast cancer surveillance, follow-up in 1 year  2. HTN, arthritis,  -She will continue follow-up with her primary care physician, and exercise  3. Genetictesting was negative  4.Fibromyalgia and body pain -Her back body pain has improved with medication  -Follow-up with PCP Dr. Maudie Mercury -She recently had MRI of her low back and hip, which showed osteoarthritis  5. Hypothyroidism -Managed by her PCP  6. Bone Health  -2013 DEXA show low bone density. Her last DEXA through Dr. Maudie Mercury was at least 3 years ago.  -Per pt she had a DEXA with her PCP Dr. Coral Spikes in the last 2 years -Continue calcium and Vitamin D daily     Plan -she is clinically doing well -Continue breast cancer surveillance -Lab and follow-up with NP Lacie in one year     No problem-specific Assessment & Plan notes found for this encounter.   No orders of the defined types were placed in this encounter.  All questions were answered. The patient knows to call the clinic with any problems, questions or concerns. No barriers to learning was detected. The total time spent in the appointment was 25 minutes.     Truitt Merle, MD 01/05/2021   I, Joslyn Devon, am acting as scribe for Truitt Merle, MD.   I have reviewed the above documentation for accuracy and completeness, and I agree with the above.

## 2021-01-05 ENCOUNTER — Inpatient Hospital Stay: Payer: PPO | Attending: Hematology

## 2021-01-05 ENCOUNTER — Encounter: Payer: Self-pay | Admitting: Hematology

## 2021-01-05 ENCOUNTER — Other Ambulatory Visit: Payer: Self-pay

## 2021-01-05 ENCOUNTER — Telehealth: Payer: Self-pay | Admitting: Hematology

## 2021-01-05 ENCOUNTER — Inpatient Hospital Stay: Payer: PPO | Admitting: Hematology

## 2021-01-05 VITALS — BP 132/74 | HR 80 | Temp 98.7°F | Resp 17 | Ht <= 58 in | Wt 162.9 lb

## 2021-01-05 DIAGNOSIS — E039 Hypothyroidism, unspecified: Secondary | ICD-10-CM | POA: Diagnosis not present

## 2021-01-05 DIAGNOSIS — Z79810 Long term (current) use of selective estrogen receptor modulators (SERMs): Secondary | ICD-10-CM | POA: Diagnosis not present

## 2021-01-05 DIAGNOSIS — R110 Nausea: Secondary | ICD-10-CM | POA: Insufficient documentation

## 2021-01-05 DIAGNOSIS — Z170 Estrogen receptor positive status [ER+]: Secondary | ICD-10-CM

## 2021-01-05 DIAGNOSIS — M8580 Other specified disorders of bone density and structure, unspecified site: Secondary | ICD-10-CM | POA: Diagnosis not present

## 2021-01-05 DIAGNOSIS — K219 Gastro-esophageal reflux disease without esophagitis: Secondary | ICD-10-CM | POA: Diagnosis not present

## 2021-01-05 DIAGNOSIS — M797 Fibromyalgia: Secondary | ICD-10-CM | POA: Diagnosis not present

## 2021-01-05 DIAGNOSIS — Z87440 Personal history of urinary (tract) infections: Secondary | ICD-10-CM | POA: Diagnosis not present

## 2021-01-05 DIAGNOSIS — I10 Essential (primary) hypertension: Secondary | ICD-10-CM

## 2021-01-05 DIAGNOSIS — R413 Other amnesia: Secondary | ICD-10-CM | POA: Insufficient documentation

## 2021-01-05 DIAGNOSIS — Z923 Personal history of irradiation: Secondary | ICD-10-CM | POA: Diagnosis not present

## 2021-01-05 DIAGNOSIS — M25552 Pain in left hip: Secondary | ICD-10-CM | POA: Insufficient documentation

## 2021-01-05 DIAGNOSIS — C50412 Malignant neoplasm of upper-outer quadrant of left female breast: Secondary | ICD-10-CM | POA: Insufficient documentation

## 2021-01-05 DIAGNOSIS — Z79899 Other long term (current) drug therapy: Secondary | ICD-10-CM | POA: Insufficient documentation

## 2021-01-05 DIAGNOSIS — G8929 Other chronic pain: Secondary | ICD-10-CM | POA: Diagnosis not present

## 2021-01-05 DIAGNOSIS — M25551 Pain in right hip: Secondary | ICD-10-CM | POA: Diagnosis not present

## 2021-01-05 LAB — CBC WITH DIFFERENTIAL/PLATELET
Abs Immature Granulocytes: 0.04 10*3/uL (ref 0.00–0.07)
Basophils Absolute: 0 10*3/uL (ref 0.0–0.1)
Basophils Relative: 0 %
Eosinophils Absolute: 0.1 10*3/uL (ref 0.0–0.5)
Eosinophils Relative: 1 %
HCT: 39.8 % (ref 36.0–46.0)
Hemoglobin: 14.1 g/dL (ref 12.0–15.0)
Immature Granulocytes: 0 %
Lymphocytes Relative: 20 %
Lymphs Abs: 2 10*3/uL (ref 0.7–4.0)
MCH: 31 pg (ref 26.0–34.0)
MCHC: 35.4 g/dL (ref 30.0–36.0)
MCV: 87.5 fL (ref 80.0–100.0)
Monocytes Absolute: 0.7 10*3/uL (ref 0.1–1.0)
Monocytes Relative: 7 %
Neutro Abs: 7.3 10*3/uL (ref 1.7–7.7)
Neutrophils Relative %: 72 %
Platelets: 392 10*3/uL (ref 150–400)
RBC: 4.55 MIL/uL (ref 3.87–5.11)
RDW: 14 % (ref 11.5–15.5)
WBC: 10.1 10*3/uL (ref 4.0–10.5)
nRBC: 0 % (ref 0.0–0.2)

## 2021-01-05 LAB — COMPREHENSIVE METABOLIC PANEL
ALT: 21 U/L (ref 0–44)
AST: 22 U/L (ref 15–41)
Albumin: 3.6 g/dL (ref 3.5–5.0)
Alkaline Phosphatase: 66 U/L (ref 38–126)
Anion gap: 13 (ref 5–15)
BUN: 16 mg/dL (ref 8–23)
CO2: 29 mmol/L (ref 22–32)
Calcium: 9.6 mg/dL (ref 8.9–10.3)
Chloride: 97 mmol/L — ABNORMAL LOW (ref 98–111)
Creatinine, Ser: 0.91 mg/dL (ref 0.44–1.00)
GFR, Estimated: >60 mL/min (ref >60)
Glucose, Bld: 106 mg/dL — ABNORMAL HIGH (ref 70–99)
Potassium: 2.8 mmol/L — ABNORMAL LOW (ref 3.5–5.1)
Sodium: 139 mmol/L (ref 135–145)
Total Bilirubin: 0.5 mg/dL (ref 0.3–1.2)
Total Protein: 6.9 g/dL (ref 6.5–8.1)

## 2021-01-05 MED ORDER — POTASSIUM CHLORIDE ER 10 MEQ PO TBCR: 10.0000 meq | EXTENDED_RELEASE_TABLET | Freq: Every day | ORAL | 2 refills | Status: DC

## 2021-01-05 NOTE — Telephone Encounter (Signed)
Scheduled appointment per 2/10 los. Spoke to patient who is aware of appointment date and time.

## 2021-01-06 DIAGNOSIS — N3020 Other chronic cystitis without hematuria: Secondary | ICD-10-CM | POA: Diagnosis not present

## 2021-01-12 DIAGNOSIS — Z803 Family history of malignant neoplasm of breast: Secondary | ICD-10-CM | POA: Diagnosis not present

## 2021-01-12 DIAGNOSIS — M85852 Other specified disorders of bone density and structure, left thigh: Secondary | ICD-10-CM | POA: Diagnosis not present

## 2021-01-12 DIAGNOSIS — Z1231 Encounter for screening mammogram for malignant neoplasm of breast: Secondary | ICD-10-CM | POA: Diagnosis not present

## 2021-01-12 DIAGNOSIS — Z780 Asymptomatic menopausal state: Secondary | ICD-10-CM | POA: Diagnosis not present

## 2021-01-12 DIAGNOSIS — M85851 Other specified disorders of bone density and structure, right thigh: Secondary | ICD-10-CM | POA: Diagnosis not present

## 2021-01-16 ENCOUNTER — Telehealth: Payer: Self-pay | Admitting: Nurse Practitioner

## 2021-01-16 ENCOUNTER — Telehealth: Payer: Self-pay | Admitting: Hematology

## 2021-01-16 NOTE — Telephone Encounter (Signed)
Called to inform patient that her upcoming appointment would be virtual. Patient requested to see Dr. Burr Medico in-person instead. Sent message to nurse with request.

## 2021-01-16 NOTE — Telephone Encounter (Signed)
Changed appointment provider per patient's request. Patient is aware of changes.

## 2021-01-19 ENCOUNTER — Encounter: Payer: Self-pay | Admitting: Hematology

## 2021-01-19 DIAGNOSIS — R311 Benign essential microscopic hematuria: Secondary | ICD-10-CM | POA: Diagnosis not present

## 2021-01-19 DIAGNOSIS — N3020 Other chronic cystitis without hematuria: Secondary | ICD-10-CM | POA: Diagnosis not present

## 2021-01-23 ENCOUNTER — Telehealth: Payer: Self-pay

## 2021-01-23 NOTE — Telephone Encounter (Signed)
I spoke with Audrey Audrey [name] and relayed Dr Audrey [name] comments and recommendations regareding Bone Density scan.  Dr Audrey [name] states she is at high risk and recommends she set up appt with Audrey Rue, NP to review treatment options.  Audrey [name] is agreeable.  She will have her previous scans faxed to our office for comparison.  Scheduling message sent.

## 2021-01-24 DIAGNOSIS — R030 Elevated blood-pressure reading, without diagnosis of hypertension: Secondary | ICD-10-CM | POA: Diagnosis not present

## 2021-01-24 DIAGNOSIS — M48062 Spinal stenosis, lumbar region with neurogenic claudication: Secondary | ICD-10-CM | POA: Diagnosis not present

## 2021-01-24 DIAGNOSIS — Z6834 Body mass index (BMI) 34.0-34.9, adult: Secondary | ICD-10-CM | POA: Diagnosis not present

## 2021-01-25 DIAGNOSIS — N139 Obstructive and reflux uropathy, unspecified: Secondary | ICD-10-CM | POA: Diagnosis not present

## 2021-01-31 NOTE — Progress Notes (Signed)
[name]   Telephone:([phone] Fax:(336) [phone]   Clinic Follow up Note   Patient Care Team: Audrey Pillar, MD as PCP - General (Family Medicine) Audrey Margarita, MD as Consulting Physician (Cardiology) Audrey Marble, MD as Consulting Physician (Otolaryngology) Audrey Overall, MD as Consulting Physician (General Surgery) Audrey Merle, MD as Consulting Physician (Hematology) Audrey Rudd, MD as Consulting Physician (Radiation Oncology) Audrey Cheese, NP as Nurse Practitioner (Hematology and Oncology) Audrey Gell, MD as Consulting Physician (Obstetrics and Gynecology) 02/01/2021  CHIEF COMPLAINT: Review DEXA results, h/o breast cancer   SUMMARY OF ONCOLOGIC HISTORY: Oncology History Overview Note  Breast cancer of upper-outer quadrant of left female breast [facility])   Staging form: Breast, AJCC 7th Edition     Pathologic stage from 11/29/2015: Stage IA (T1c, N0, cM0) - Signed by Audrey Merle, MD on 01/14/2016     Clinical: Stage IA (T1c, N0, M0) - Unsigned      Breast cancer of upper-outer quadrant of left female breast ([place])  07/18/2014 Procedure   Hereditary cancer panel: no deleterious mutation at ATM, BARD1, BRCA1, BRCA2, BRIP1, CDH1, CHEK2, EPCAM, MLH1, MSH2, MSH6, NBN, PALB2, PMS2, PTEN, RAD51C, RAD51D, STK11, and TP53.   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   12/27/2015 Pathology Results   Second surgery with sentinel lymph node biopsy  showed no malignant cells in 5 lymph nodes   01/23/2016 - 02/17/2016 Radiation Therapy   Adjuvant left Breast radiation: Left breast treated to 42.5 Gy in 17 fractions, Left breast boost treated to 7.5 Gy in 3 fractions     Anti-estrogen oral therapy   She declined adjuvant AI or tamoxifen    05/03/2016 Survivorship   SCP visit completed   10/31/2017 Mammogram   IMPRESSION: There is no mammographic evidence of malignancy.      CURRENT THERAPY: Surveillance   INTERVAL HISTORY: Audrey [name] returns for follow-up to review DEXA results.  She notes she has had bone density screenings since her 57s.  She has been on calcium and vitamin D since then.  She is active with walking and yoga.  She fell and fractured her spine in the past.  She has chronic back and hip pain but she remains functional.  She takes NSAIDs daily.  Her bone density has not been explained to her in the past.   MEDICAL HISTORY:  Past Medical History:  Diagnosis Date  . Allergy   . Arthritis   . Bladder infection    hx of last one approx 5 years ago   . Breast cancer ([place]) 10/25/15   left breast  ADH   . Cancer ([place] [name])    breast  . Complication of anesthesia    difficulty awakening   . Diastolic dysfunction   . Dyspnea    due to pain  . Fibromyalgia   . GERD (gastroesophageal reflux disease)    Tums if needed  . 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 Hypertension   .  Hypothyroidism   . Obesity   . PONV (postoperative nausea and vomiting)   . Reflux    buring and dysphagia sx resp to PPI; egd 3/06 neg for Barretts  . 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 Yeast infection    hx of     SURGICAL HISTORY: Past Surgical History:  Procedure Laterality Date  . AXILLARY LYMPH NODE BIOPSY Left 12/27/2015   Procedure: LEFT AXILLARY SENTINEL LYMPH NODE BIOPSY;  Surgeon: Audrey Overall, MD;  Location: [place];  Service: General;   Laterality: Left;  . BELPHAROPTOSIS REPAIR Bilateral   . BREAST LUMPECTOMY WITH RADIOACTIVE SEED LOCALIZATION Left 12/01/2015   Procedure: LEFT BREAST [place] RADIOACTIVE SEED LOCALIZATION;  Surgeon: Audrey Overall, MD;  Location: [place];  Service: General;  Laterality: Left;  . CATARACT EXTRACTION, BILATERAL Bilateral 08/2018  . COLONOSCOPY  3/06   NEG  . cortisone injections     right arm; back  . EYE SURGERY     cosmetic; eyelid surgery bilat   . FINGER SURGERY Left    left index finger/ cyst 02/2007  . KNEE ARTHROSCOPY Right 04/13/2015   Procedure: ARTHROSCOPY RIGHT KNEE WITH MEDIAL MENISCAL DEBRIDEMENT AND CHONDROPLASTY;  Surgeon: Audrey Arabian, MD;  [facility];  Service: Orthopedics;  Laterality: Right;  . KYPHOPLASTY N/A 03/16/2020   Procedure: KYPHOPLASTY THORACIC THREE, THORACIC FOUR, THORACIC SIX;  Surgeon: Audrey Gamma, MD;  Location: [place];  Service: Neurosurgery;  Laterality: N/A;  . left rotator cuff repair Left    12/2006  . LUMBAR LAMINECTOMY    . TONSILLECTOMY      I have reviewed the social history and family history with the patient and they are unchanged from previous note.  ALLERGIES:  is allergic to contrast media [iodinated diagnostic agents], other, cymbalta [duloxetine hcl], and macrodantin [nitrofurantoin].  MEDICATIONS:  Current Outpatient Medications  Medication Sig Dispense Refill  . Calcium Carb-Cholecalciferol (CALCIUM + D3 PO) Take 1 each by mouth daily. 500 vit D    . cholecalciferol (VITAMIN D) 25 MCG (1000 UNIT) tablet Take 1,000 Units by mouth daily.     Marland Kitchen Co-Enzyme Q10 100 MG CAPS Take 100 mg by mouth daily.     Marland Kitchen CRANBERRY PO Take 4,200 mg by mouth 2 (two) times daily.     . Cyanocobalamin (VITAMIN B-12) 5000 MCG SUBL Place 5,000 mcg under the tongue daily.     Marland Kitchen gabapentin (NEURONTIN) 300 MG capsule Take 1 tablet in the morning and 4 tablets at bedtime 450 capsule 3  . Glucos-Chondroit-Hyaluron-MSM (GLUCOSAMINE CHONDROITIN JOINT PO) Take  1 tablet by mouth 2 (two) times daily.     . hydrochlorothiazide (MICROZIDE) 12.5 MG capsule Take 1 capsule (12.5 mg total) by mouth daily. 90 capsule 1  . HYDROcodone-acetaminophen (NORCO/VICODIN) 5-325 MG tablet Take 1 tablet by mouth every 4 (four) hours as needed (pain). 20 tablet 0  . Lactobacillus-Inulin (CULTURELLE DIGESTIVE DAILY PO) Take 1 tablet by mouth daily.    Marland Kitchen levothyroxine (SYNTHROID, LEVOTHROID) 25 MCG tablet Take 25 mcg by mouth 4 (four) times a week. Mon. Wed and Friday and one on the weekend    . loratadine (CLARITIN) 10 MG tablet Take 10 mg by mouth daily as needed for allergies.     . magnesium oxide (MAG-OX) 400 MG tablet Take 200 mg by mouth daily.    . methenamine (HIPREX) 1 g tablet Take 1 g by mouth 2 (two) times daily with  a meal.    . Multiple Vitamins-Minerals (PRESERVISION AREDS 2) CAPS Take 1 tablet by mouth 2 (two) times daily.     . nebivolol (BYSTOLIC) 5 MG tablet Take 5 mg by mouth daily.    . Omega 3 1200 MG CAPS Take 1,280 mg by mouth 2 (two) times daily.    Vladimir Faster Glyc-Propyl Glyc PF (SYSTANE ULTRA PF) 0.4-0.3 % SOLN Place 1 drop into both eyes daily.    . potassium chloride (KLOR-CON) 10 MEQ tablet Take 1 tablet (10 mEq total) by mouth daily. Take 2 tabs for one week then change to once daily 60 tablet 2  . pyridOXINE (VITAMIN B-6) 100 MG tablet Take 100 mg by mouth daily.     No current facility-administered medications for this visit.    PHYSICAL EXAMINATION:  Vitals:   02/01/21 1049  BP: 139/83  Pulse: 67  Resp: 20  Temp: 98.1 F (36.7 C)  SpO2: 96%   Filed Weights   02/01/21 1049  Weight: 166 lb 14.4 oz (75.7 kg)    GENERAL:alert, no distress and comfortable SKIN: No rash EYES: sclera clear OROPHARYNX: No thrush, ulcers, obvious gingival disease, no broken or missing teeth LUNGS:  normal breathing effort HEART:  no lower extremity edema NEURO: alert & oriented x 3 with fluent speech, no focal motor/sensory  deficits  LABORATORY DATA:  I have reviewed the data as listed CBC Latest Ref Rng & Units 01/05/2021 03/16/2020 02/10/2020  WBC 4.0 - 10.5 K/uL 10.1 10.4 10.2  Hemoglobin 12.0 - 15.0 g/dL 14.1 14.1 14.9  Hematocrit 36.0 - 46.0 % 39.8 40.9 42.4  Platelets 150 - 400 K/uL 392 446(H) 449(H)     CMP Latest Ref Rng & Units 01/05/2021 03/16/2020 02/10/2020  Glucose 70 - 99 mg/dL 106(H) 97 96  BUN 8 - 23 mg/dL _0 Creatinine 0.44 - 1.00 mg/dL 0.91 0.78 0.84  Sodium 135 - 145 mmol/L 139 137 138  Potassium 3.5 - 5.1 mmol/L 2.8(L) 2.8(L) 3.1(L)  Chloride 98 - 111 mmol/L 97(L) 98 98  CO2 22 - 32 mmol/L _1 Calcium 8.9 - 10.3 mg/dL 9.6 9.5 9.5  Total Protein 6.5 - 8.1 g/dL 6.9 6.8 7.0  Total Bilirubin 0.3 - 1.2 mg/dL 0.5 0.9 0.6  Alkaline Phos 38 - 126 U/L 66 102 75  AST 15 - 41 U/L _2 ALT 0 - 44 U/L _3 RADIOGRAPHIC STUDIES: I have personally reviewed the radiological images as listed and agreed with the findings in the report. No results found.   ASSESSMENT & PLAN: SONJI STARKES is a 80 y.o. female with   1. 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  Oncotype showed low risk recurrence score 7, no chemo benefit.  She declined antiestrogen therapy and continues surveillance -Genetic testing was negative -Mammogram 01/12/2021 shows no evidence of malignancy -Previous exam by Dr. Burr Medico 01/05/21 showed no clinical concern for recurrence -Continue surveillance  2.  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  Her FRAX risks remain essentially the same with a 13% risk of major osteoporotic fracture and a 3.3% risk of hip fracture -She had a fall in the past and fractured her spine -Takes daily calcium and vitamin D and exercises with walking and yoga -Given her risk factors including high FRAX hip fracture, previous fall, PPI use, thyroid med, and worsening bone density in right femur neck, I recommend to consider bisphosphonate.   -We discussed potential side effects and duration of Fosamax, Prolia, and Zometa and she was given printed reading material -Given her history of breast cancer, I recommend to consider Zometa every 6 months for 2 years.  She had a recent dental visit with Dr. Leonides Sake in [facility] -We also discussed the option to continue calcium/vitamin D and exercise and hold on bisphosphonates.  Given the improvement in the left femur with conservative management alone, I think this is also reasonable.  -She plans to discuss with her family and call me with a decision.   2. Arthritis, fibromyalgia, and body pain -She will continue follow-up with her primary care physician, and exercise -Recent MRI showed OA  Plan: -Reviewed recent labs and outside DEXA reports -Discussed osteopenia management, I recommend starting zometa IV q6 months x2 years for osteopenia with high fracture risk  -Patient will consider adding bisphosphonate versus continue calcium vitamin D and exercise alone -Patient will call with decision -annual surveillance visit for h/o breast cancer in 12/2021  All questions were answered. The patient knows to call the clinic with any problems, questions or concerns. No barriers to learning were detected. Total encounter time was 30 minutes.      Alla Feeling, NP 02/01/21

## 2021-02-01 ENCOUNTER — Other Ambulatory Visit: Payer: Self-pay

## 2021-02-01 ENCOUNTER — Encounter: Payer: Self-pay | Admitting: Nurse Practitioner

## 2021-02-01 ENCOUNTER — Inpatient Hospital Stay: Payer: PPO | Attending: Hematology | Admitting: Nurse Practitioner

## 2021-02-01 DIAGNOSIS — M25559 Pain in unspecified hip: Secondary | ICD-10-CM | POA: Insufficient documentation

## 2021-02-01 DIAGNOSIS — E669 Obesity, unspecified: Secondary | ICD-10-CM | POA: Diagnosis not present

## 2021-02-01 DIAGNOSIS — Z79811 Long term (current) use of aromatase inhibitors: Secondary | ICD-10-CM | POA: Diagnosis not present

## 2021-02-01 DIAGNOSIS — Z170 Estrogen receptor positive status [ER+]: Secondary | ICD-10-CM | POA: Insufficient documentation

## 2021-02-01 DIAGNOSIS — I10 Essential (primary) hypertension: Secondary | ICD-10-CM | POA: Diagnosis not present

## 2021-02-01 DIAGNOSIS — K219 Gastro-esophageal reflux disease without esophagitis: Secondary | ICD-10-CM | POA: Diagnosis not present

## 2021-02-01 DIAGNOSIS — C50412 Malignant neoplasm of upper-outer quadrant of left female breast: Secondary | ICD-10-CM | POA: Insufficient documentation

## 2021-02-01 DIAGNOSIS — M8580 Other specified disorders of bone density and structure, unspecified site: Secondary | ICD-10-CM | POA: Insufficient documentation

## 2021-02-01 DIAGNOSIS — Z79899 Other long term (current) drug therapy: Secondary | ICD-10-CM | POA: Insufficient documentation

## 2021-02-01 DIAGNOSIS — E039 Hypothyroidism, unspecified: Secondary | ICD-10-CM | POA: Insufficient documentation

## 2021-02-01 DIAGNOSIS — Z923 Personal history of irradiation: Secondary | ICD-10-CM | POA: Diagnosis not present

## 2021-02-01 DIAGNOSIS — M797 Fibromyalgia: Secondary | ICD-10-CM | POA: Diagnosis not present

## 2021-02-01 NOTE — Patient Instructions (Signed)
Zoledronic Acid Injection (Hypercalcemia, Oncology) What is this medicine? ZOLEDRONIC ACID (ZOE le dron ik AS id) slows calcium loss from bones. It high calcium levels in the blood from some kinds of cancer. It may be used in other people at risk for bone loss. This medicine may be used for other purposes; ask your health care provider or pharmacist if you have questions. COMMON BRAND NAME(S): Zometa What should I tell my health care provider before I take this medicine? They need to know if you have any of these conditions:  cancer  dehydration  dental disease  kidney disease  liver disease  low levels of calcium in the blood  lung or breathing disease (asthma)  receiving steroids like dexamethasone or prednisone  an unusual or allergic reaction to zoledronic acid, other medicines, foods, dyes, or preservatives  pregnant or trying to get pregnant  breast-feeding How should I use this medicine? This drug is injected into a vein. It is given by a health care provider in a hospital or clinic setting. Talk to your health care provider about the use of this drug in children. Special care may be needed. Overdosage: If you think you have taken too much of this medicine contact a poison control center or emergency room at once. NOTE: This medicine is only for you. Do not share this medicine with others. What if I miss a dose? Keep appointments for follow-up doses. It is important not to miss your dose. Call your health care provider if you are unable to keep an appointment. What may interact with this medicine?  certain antibiotics given by injection  NSAIDs, medicines for pain and inflammation, like ibuprofen or naproxen  some diuretics like bumetanide, furosemide  teriparatide  thalidomide This list may not describe all possible interactions. Give your health care provider a list of all the medicines, herbs, non-prescription drugs, or dietary supplements you use. Also tell  them if you smoke, drink alcohol, or use illegal drugs. Some items may interact with your medicine. What should I watch for while using this medicine? Visit your health care provider for regular checks on your progress. It may be some time before you see the benefit from this drug. Some people who take this drug have severe bone, joint, or muscle pain. This drug may also increase your risk for jaw problems or a broken thigh bone. Tell your health care provider right away if you have severe pain in your jaw, bones, joints, or muscles. Tell you health care provider if you have any pain that does not go away or that gets worse. Tell your dentist and dental surgeon that you are taking this drug. You should not have major dental surgery while on this drug. See your dentist to have a dental exam and fix any dental problems before starting this drug. Take good care of your teeth while on this drug. Make sure you see your dentist for regular follow-up appointments. You should make sure you get enough calcium and vitamin D while you are taking this drug. Discuss the foods you eat and the vitamins you take with your health care provider. Check with your health care provider if you have severe diarrhea, nausea, and vomiting, or if you sweat a lot. The loss of too much body fluid may make it dangerous for you to take this drug. You may need blood work done while you are taking this drug. Do not become pregnant while taking this drug. Women should inform their health care provider  if they wish to become pregnant or think they might be pregnant. There is potential for serious harm to an unborn child. Talk to your health care provider for more information. What side effects may I notice from receiving this medicine? Side effects that you should report to your doctor or health care provider as soon as possible:  allergic reactions (skin rash, itching or hives; swelling of the face, lips, or tongue)  bone  pain  infection (fever, chills, cough, sore throat, pain or trouble passing urine)  jaw pain, especially after dental work  joint pain  kidney injury (trouble passing urine or change in the amount of urine)  low blood pressure (dizziness; feeling faint or lightheaded, falls; unusually weak or tired)  low calcium levels (fast heartbeat; muscle cramps or pain; pain, tingling, or numbness in the hands or feet; seizures)  low magnesium levels (fast, irregular heartbeat; muscle cramp or pain; muscle weakness; tremors; seizures)  low red blood cell counts (trouble breathing; feeling faint; lightheaded, falls; unusually weak or tired)  muscle pain  redness, blistering, peeling, or loosening of the skin, including inside the mouth  severe diarrhea  swelling of the ankles, feet, hands  trouble breathing Side effects that usually do not require medical attention (report to your doctor or health care provider if they continue or are bothersome):  anxious  constipation  coughing  depressed mood  eye irritation, itching, or pain  fever  general ill feeling or flu-like symptoms  nausea  pain, redness, or irritation at site where injected  trouble sleeping This list may not describe all possible side effects. Call your doctor for medical advice about side effects. You may report side effects to FDA at 1-800-FDA-1088. Where should I keep my medicine? This drug is given in a hospital or clinic. It will not be stored at home. NOTE: This sheet is a summary. It may not cover all possible information. If you have questions about this medicine, talk to your doctor, pharmacist, or health care provider.  2021 Elsevier/Gold Standard (2019-08-27 09:13:00)   Denosumab injection What is this medicine? DENOSUMAB (den oh sue mab) slows bone breakdown. Prolia is used to treat osteoporosis in women after menopause and in men, and in people who are taking corticosteroids for 6 months or  more. Delton See is used to treat a high calcium level due to cancer and to prevent bone fractures and other bone problems caused by multiple myeloma or cancer bone metastases. Delton See is also used to treat giant cell tumor of the bone. This medicine may be used for other purposes; ask your health care provider or pharmacist if you have questions. COMMON BRAND NAME(S): Prolia, XGEVA What should I tell my health care provider before I take this medicine? They need to know if you have any of these conditions:  dental disease  having surgery or tooth extraction  infection  kidney disease  low levels of calcium or Vitamin D in the blood  malnutrition  on hemodialysis  skin conditions or sensitivity  thyroid or parathyroid disease  an unusual reaction to denosumab, other medicines, foods, dyes, or preservatives  pregnant or trying to get pregnant  breast-feeding How should I use this medicine? This medicine is for injection under the skin. It is given by a health care professional in a hospital or clinic setting. A special MedGuide will be given to you before each treatment. Be sure to read this information carefully each time. For Prolia, talk to your pediatrician regarding the use  of this medicine in children. Special care may be needed. For Delton See, talk to your pediatrician regarding the use of this medicine in children. While this drug may be prescribed for children as young as 13 years for selected conditions, precautions do apply. Overdosage: If you think you have taken too much of this medicine contact a poison control center or emergency room at once. NOTE: This medicine is only for you. Do not share this medicine with others. What if I miss a dose? It is important not to miss your dose. Call your doctor or health care professional if you are unable to keep an appointment. What may interact with this medicine? Do not take this medicine with any of the following medications:  other  medicines containing denosumab This medicine may also interact with the following medications:  medicines that lower your chance of fighting infection  steroid medicines like prednisone or cortisone This list may not describe all possible interactions. Give your health care provider a list of all the medicines, herbs, non-prescription drugs, or dietary supplements you use. Also tell them if you smoke, drink alcohol, or use illegal drugs. Some items may interact with your medicine. What should I watch for while using this medicine? Visit your doctor or health care professional for regular checks on your progress. Your doctor or health care professional may order blood tests and other tests to see how you are doing. Call your doctor or health care professional for advice if you get a fever, chills or sore throat, or other symptoms of a cold or flu. Do not treat yourself. This drug may decrease your body's ability to fight infection. Try to avoid being around people who are sick. You should make sure you get enough calcium and vitamin D while you are taking this medicine, unless your doctor tells you not to. Discuss the foods you eat and the vitamins you take with your health care professional. See your dentist regularly. Brush and floss your teeth as directed. Before you have any dental work done, tell your dentist you are receiving this medicine. Do not become pregnant while taking this medicine or for 5 months after stopping it. Talk with your doctor or health care professional about your birth control options while taking this medicine. Women should inform their doctor if they wish to become pregnant or think they might be pregnant. There is a potential for serious side effects to an unborn child. Talk to your health care professional or pharmacist for more information. What side effects may I notice from receiving this medicine? Side effects that you should report to your doctor or health care  professional as soon as possible:  allergic reactions like skin rash, itching or hives, swelling of the face, lips, or tongue  bone pain  breathing problems  dizziness  jaw pain, especially after dental work  redness, blistering, peeling of the skin  signs and symptoms of infection like fever or chills; cough; sore throat; pain or trouble passing urine  signs of low calcium like fast heartbeat, muscle cramps or muscle pain; pain, tingling, numbness in the hands or feet; seizures  unusual bleeding or bruising  unusually weak or tired Side effects that usually do not require medical attention (report to your doctor or health care professional if they continue or are bothersome):  constipation  diarrhea  headache  joint pain  loss of appetite  muscle pain  runny nose  tiredness  upset stomach This list may not describe all possible side  effects. Call your doctor for medical advice about side effects. You may report side effects to FDA at 1-800-FDA-1088. Where should I keep my medicine? This medicine is only given in a clinic, doctor's office, or other health care setting and will not be stored at home. NOTE: This sheet is a summary. It may not cover all possible information. If you have questions about this medicine, talk to your doctor, pharmacist, or health care provider.  2021 Elsevier/Gold Standard (2018-03-21 16:10:44)   Alendronate Oral Solution What is this medicine? ALENDRONATE (a LEN droe nate) slows calcium loss from bones. It treatsosteoporosis. It may be used in other people at risk for bone loss. This medicine may be used for other purposes; ask your health care provider or pharmacist if you have questions. COMMON BRAND NAME(S): Fosamax What should I tell my health care provider before I take this medicine? They need to know if you have any of these conditions:  bleeding disorder  cancer  dental disease  difficulty swallowing  infection  (fever, chills, cough, sore throat, pain or trouble passing urine)  kidney disease  low levels of calcium or other minerals in the blood  low red blood cell counts  receiving steroids like dexamethasone or prednisone  stomach or intestine problems  trouble sitting or standing for 30 minutes  an unusual or allergic reaction to alendronate, other drugs, foods, dyes or preservatives  pregnant or trying to get pregnant  breast-feeding How should I use this medicine? Take this drug by mouth with a full glass of water. Take it as directed on the prescription label at the same time every day. Use a specially marked oral syringe, spoon, or dropper to measure each dose. Ask your pharmacist if you do not have one. Household spoons are not accurate. Take the dose right after waking up. Do not eat or drink anything before taking it. Do not take it with any other drink except water. After taking it, do not eat breakfast, drink, or take any other drugs or vitamins for at least 30 minutes. Sit or stand up for at least 30 minutes after you take it. Do not lie down. Keep taking it unless your health care provider tells you to stop. A special MedGuide will be given to you by the pharmacist with each prescription and refill. Be sure to read this information carefully each time. Talk to your health care provider about the use of this drug in children. Special care may be needed. Overdosage: If you think you have taken too much of this medicine contact a poison control center or emergency room at once. NOTE: This medicine is only for you. Do not share this medicine with others. What if I miss a dose? If you take your drug once a day, skip it. Take your next dose at the scheduled time the next morning. Do not take two doses on the same day. If you take your drug once a week, take the missed dose on the morning after you remember. Do not take two doses on the same day. What may interact with this  medicine?  aluminum hydroxide  antacids  aspirin  calcium supplements  drugs for inflammation like ibuprofen, naproxen, and others  iron supplements  magnesium supplements  vitamins with minerals This list may not describe all possible interactions. Give your health care provider a list of all the medicines, herbs, non-prescription drugs, or dietary supplements you use. Also tell them if you smoke, drink alcohol, or use illegal drugs.  Some items may interact with your medicine. What should I watch for while using this medicine? Visit your health care provider for regular checks on your progress. It may be some time before you see the benefit from this drug. Some people who take this drug have severe bone, joint, or muscle pain. This drug may also increase your risk for jaw problems or a broken thigh bone. Tell your health care provider right away if you have severe pain in your jaw, bones, joints, or muscles. Tell you health care provider if you have any pain that does not go away or that gets worse. Tell your dentist and dental surgeon that you are taking this drug. You should not have major dental surgery while on this drug. See your dentist to have a dental exam and fix any dental problems before starting this drug. Take good care of your teeth while on this drug. Make sure you see your dentist for regular follow-up appointments. You should make sure you get enough calcium and vitamin D while you are taking this drug. Discuss the foods you eat and the vitamins you take with your health care provider. You may need blood work done while you are taking this drug. What side effects may I notice from receiving this medicine? Side effects that you should report to your doctor or health care provider as soon as possible:  allergic reactions (skin rash, itching or hives; swelling of the face, lips, or tongue)  bone pain  heartburn (burning feeling in chest, often after eating or when lying  down)  jaw pain, especially after dental work  joint pain  low calcium levels (fast heartbeat; muscle cramps or pain; pain, tingling, or numbness in the hands or feet; seizures)  muscle pain  painful or difficulty swallowing  redness, blistering, peeling, or loosening of the skin, including inside the mouth  stomach bleeding (bloody or black, tarry stools; spitting up blood or brown material that looks like coffee grounds) Side effects that usually do not require medical attention (report to your doctor or health care provider if they continue or are bothersome):  constipation  diarrhea  nausea This list may not describe all possible side effects. Call your doctor for medical advice about side effects. You may report side effects to FDA at 1-800-FDA-1088. Where should I keep my medicine? Keep out of the reach of children and pets. Store at room temperature between 20 and 25 degrees C (68 and 77 degrees F). Do not freeze. Throw away any unused drug after the expiration date. NOTE: This sheet is a summary. It may not cover all possible information. If you have questions about this medicine, talk to your doctor, pharmacist, or health care provider.  2021 Elsevier/Gold Standard (2019-09-30 20:38:30)

## 2021-02-14 DIAGNOSIS — M25551 Pain in right hip: Secondary | ICD-10-CM | POA: Diagnosis not present

## 2021-02-14 DIAGNOSIS — E7800 Pure hypercholesterolemia, unspecified: Secondary | ICD-10-CM | POA: Diagnosis not present

## 2021-02-14 DIAGNOSIS — E039 Hypothyroidism, unspecified: Secondary | ICD-10-CM | POA: Diagnosis not present

## 2021-02-14 DIAGNOSIS — M25552 Pain in left hip: Secondary | ICD-10-CM | POA: Diagnosis not present

## 2021-02-14 DIAGNOSIS — I10 Essential (primary) hypertension: Secondary | ICD-10-CM | POA: Diagnosis not present

## 2021-02-14 DIAGNOSIS — M8588 Other specified disorders of bone density and structure, other site: Secondary | ICD-10-CM | POA: Diagnosis not present

## 2021-02-14 DIAGNOSIS — E876 Hypokalemia: Secondary | ICD-10-CM | POA: Diagnosis not present

## 2021-02-15 ENCOUNTER — Encounter: Payer: Self-pay | Admitting: Hematology

## 2021-02-16 ENCOUNTER — Telehealth: Payer: Self-pay | Admitting: Adult Health

## 2021-02-16 ENCOUNTER — Encounter: Payer: Self-pay | Admitting: Hematology

## 2021-02-16 NOTE — Telephone Encounter (Signed)
I LMVM for her that returning call.

## 2021-02-16 NOTE — Telephone Encounter (Signed)
Pt called,having upper right thigh pain. Have seen my PCP, believe its orthopedic. Need a sooner appt, would like a call from the nurse.

## 2021-02-21 NOTE — Telephone Encounter (Signed)
LMVm for pt to return call.

## 2021-03-08 DIAGNOSIS — M25552 Pain in left hip: Secondary | ICD-10-CM | POA: Diagnosis not present

## 2021-03-08 DIAGNOSIS — N3020 Other chronic cystitis without hematuria: Secondary | ICD-10-CM | POA: Diagnosis not present

## 2021-03-08 DIAGNOSIS — M25551 Pain in right hip: Secondary | ICD-10-CM | POA: Diagnosis not present

## 2021-03-15 DIAGNOSIS — M25551 Pain in right hip: Secondary | ICD-10-CM | POA: Diagnosis not present

## 2021-03-15 DIAGNOSIS — M25552 Pain in left hip: Secondary | ICD-10-CM | POA: Diagnosis not present

## 2021-03-16 DIAGNOSIS — M25559 Pain in unspecified hip: Secondary | ICD-10-CM | POA: Diagnosis not present

## 2021-04-05 DIAGNOSIS — M25551 Pain in right hip: Secondary | ICD-10-CM | POA: Diagnosis not present

## 2021-04-05 DIAGNOSIS — M25552 Pain in left hip: Secondary | ICD-10-CM | POA: Diagnosis not present

## 2021-04-05 DIAGNOSIS — M48062 Spinal stenosis, lumbar region with neurogenic claudication: Secondary | ICD-10-CM | POA: Diagnosis not present

## 2021-04-12 ENCOUNTER — Ambulatory Visit: Payer: PPO | Admitting: Adult Health

## 2021-04-12 ENCOUNTER — Encounter: Payer: Self-pay | Admitting: Adult Health

## 2021-04-12 VITALS — BP 150/88 | HR 76 | Ht 59.0 in | Wt 164.0 lb

## 2021-04-12 DIAGNOSIS — G609 Hereditary and idiopathic neuropathy, unspecified: Secondary | ICD-10-CM | POA: Diagnosis not present

## 2021-04-12 NOTE — Patient Instructions (Signed)
Your Plan:  Continue Gabapentin. Take nighttime dose 1 hour before bedtime.  Use compound cream at bedtime If your symptoms worsen or you develop new symptoms please let us know.   Thank you for coming to see [place] at [facility]. I hope we have been able to provide you high quality care today.  You may receive a patient satisfaction survey over the next few weeks. We would appreciate your feedback and comments so that we may continue to improve ourselves and the health of our patients.

## 2021-04-12 NOTE — Progress Notes (Signed)
PATIENT: Audrey [name] DOB: [birth year]  REASON FOR VISIT: follow up HISTORY FROM: patient  HISTORY OF PRESENT ILLNESS: Today 04/12/21:  Ms. Audrey [name] is a 80 year old female with a history of peripheral neuropathy.  She returns today for follow-up.  She reports that she has noticed more discomfort at nighttime.  She states that she does not have pain but describes it as itching.  In the feet.  She states that the cream helps but it takes a while before she notices the benefit.  Reports that she continues to have numbness in the feet.  States that her feet feel cold but are warm to the touch.  She states that she takes 1200 mg of gabapentin about 30 minutes before bedtime she tried to reduce to 900 mg but her discomfort increased.  She continues to take 300 mg in the morning denies any discomfort throughout the day just at bedtime.  Patient also reports that she has been having pain in the upper thighs in the groin region that radiates to the inner part of the thighs.  She states that she also has pain down the lateral aspect of both thighs.  The discomfort comes and goes.  She followed up with Dr. Saintclair Halsted and had an MRI of the back and hips.  I have not seen these images.  She reports that he told her there was no further work-up.  She also saw Dr. Wynelle Link and was told that it was spinal stenosis and advised that she should follow-up with her back surgeon.  I have not reviewed the notes from either doctors.  The patient is asking for my opinion.  10/11/20: Ms. Audrey [name] is a 80 year old female with a history of peripheral neuropathy.  She returns today for follow-up.  She states that there are some nights she is unable to sleep due to her neuropathy.  She states that most the time it feels as if her feet are itching.  This has always been how her neuropathy presented.  She does not have any significant pain associated with her neuropathy.  She reports that she adjusted her gabapentin and some nights  will take 4 to 5 tablets at bedtime.  She reports that the increase in gabapentin was helpful.  She is currently taking gabapentin 300 mg in the morning and 1200 to 1500 mg at bedtime.  She denies any side effects from gabapentin.  Denies any changes with her gait or balance.  She does report back in March she tripped and fractured her neck and had to have surgery.  Since then she is not had any additional falls.  She returns today for an evaluation.  HISTORY 07/09/19:  Ms. Audrey [name] is a 80 year old female with a history of peripheral neuropathy.  She returns today for follow-up.  She continues to take gabapentin 300 mg in the morning and at noon and 600 mg at bedtime.  She states that this continues to work well for her.  Over the last 6 months she has had 3 episodes where she will have a exacerbation of pain in her feet.  She describes it as a sharp shooting pain that can last for a while.  She states when this does happen she will take an extra gabapentin and it resolves the pain.  She denies any significant changes with her gait or balance.  She states that her balance is not as good as it was because she is not able to go to yoga class to the pandemic.  She denies any new symptoms.  She returns today for an evaluation.  REVIEW OF SYSTEMS: Out of a complete 14 system review of symptoms, the patient complains only of the following symptoms, and all other reviewed systems are negative.  See HPI  ALLERGIES: Allergies  Allergen Reactions  . Contrast Media [Iodinated Diagnostic Agents] Shortness Of Breath  . Other Swelling    Cat gut sutures Cats and dogs                Derma bond- intolerance- itching- tolerates with Benadryl      . Cymbalta [Duloxetine Hcl]     Diarrhea  . Macrodantin [Nitrofurantoin] Rash    HOME MEDICATIONS: Outpatient Medications Prior to Visit  Medication Sig Dispense Refill  . Calcium Carb-Cholecalciferol (CALCIUM + D3 PO) Take 1 each by mouth daily. 500 vit D    .  cholecalciferol (VITAMIN D) 25 MCG (1000 UNIT) tablet Take 1,000 Units by mouth daily.     Marland Kitchen Co-Enzyme Q10 100 MG CAPS Take 100 mg by mouth daily.     Marland Kitchen CRANBERRY PO Take 4,200 mg by mouth 2 (two) times daily.     . Cyanocobalamin (VITAMIN B-12) 5000 MCG SUBL Place 5,000 mcg under the tongue daily.     Marland Kitchen gabapentin (NEURONTIN) 300 MG capsule Take 1 tablet in the morning and 4 tablets at bedtime 450 capsule 3  . Glucos-Chondroit-Hyaluron-MSM (GLUCOSAMINE CHONDROITIN JOINT PO) Take 1 tablet by mouth 2 (two) times daily.     . hydrochlorothiazide (MICROZIDE) 12.5 MG capsule Take 1 capsule (12.5 mg total) by mouth daily. 90 capsule 1  . HYDROcodone-acetaminophen (NORCO/VICODIN) 5-325 MG tablet Take 1 tablet by mouth every 4 (four) hours as needed (pain). 20 tablet 0  . Lactobacillus-Inulin (CULTURELLE DIGESTIVE DAILY PO) Take 1 tablet by mouth daily.    Marland Kitchen levothyroxine (SYNTHROID, LEVOTHROID) 25 MCG tablet Take 25 mcg by mouth 4 (four) times a week. Mon. Wed and Friday and one on the weekend    . loratadine (CLARITIN) 10 MG tablet Take 10 mg by mouth daily as needed for allergies.     . magnesium oxide (MAG-OX) 400 MG tablet Take 200 mg by mouth daily.    . methenamine (HIPREX) 1 g tablet Take 1 g by mouth 2 (two) times daily with a meal.    . Multiple Vitamins-Minerals (PRESERVISION AREDS 2) CAPS Take 1 tablet by mouth 2 (two) times daily.     . nebivolol (BYSTOLIC) 5 MG tablet Take 5 mg by mouth daily.    . Omega 3 1200 MG CAPS Take 1,280 mg by mouth 2 (two) times daily.    Vladimir Faster Glyc-Propyl Glyc PF (SYSTANE ULTRA PF) 0.4-0.3 % SOLN Place 1 drop into both eyes daily.    . potassium chloride (KLOR-CON) 10 MEQ tablet Take 1 tablet (10 mEq total) by mouth daily. Take 2 tabs for one week then change to once daily 60 tablet 2  . pyridOXINE (VITAMIN B-6) 100 MG tablet Take 100 mg by mouth daily.    . traMADol (ULTRAM) 50 MG tablet Take 50 mg by mouth every 6 (six) hours as needed.     No  facility-administered medications prior to visit.    PAST MEDICAL HISTORY: Past Medical History:  Diagnosis Date  . Allergy   . Arthritis   . Bladder infection    hx of last one approx 5 years ago   . Breast cancer ([place]) 10/25/15   left breast  ADH   .  Cancer ([facility])    breast  . Complication of anesthesia    difficulty awakening   . Diastolic dysfunction   . Dyspnea    due to pain  . Fibromyalgia   . GERD (gastroesophageal reflux disease)    Tums if needed  . 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 Hypertension   . Hypothyroidism   . Obesity   . PONV (postoperative nausea and vomiting)   . Reflux    buring and dysphagia sx resp to PPI; egd 3/06 neg for Bar[place]ts  . 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 Yeast infection    hx of     PAST SURGICAL HISTORY: Past Surgical History:  Procedure Laterality Date  . AXILLARY LYMPH NODE BIOPSY Left 12/27/2015   Procedure: LEFT AXILLARY SENTINEL LYMPH NODE BIOPSY;  Surgeon: [name], MD;  Location: [facility];  Service: General;  Laterality: Left;  . BELPHAROPTOSIS REPAIR Bilateral   . BREAST LUMPECTOMY WITH RADIOACTIVE SEED LOCALIZATION Left 12/01/2015   Procedure: LEFT BREAST [place] RADIOACTIVE SEED LOCALIZATION;  Surgeon: [name], MD;  Location: [facility];  Service: General;  Laterality: Left;  . CATARACT EXTRACTION, BILATERAL Bilateral 08/2018  . COLONOSCOPY  3/06   NEG  . cortisone injections     right arm; back  . EYE SURGERY     cosmetic; eyelid surgery bilat   . FINGER SURGERY Left    left index finger/ cyst 02/2007  . KNEE ARTHROSCOPY Right 04/13/2015   Procedure: ARTHROSCOPY RIGHT KNEE WITH MEDIAL MENISCAL DEBRIDEMENT AND CHONDROPLASTY;  Surgeon: Gaynelle Arabian, MD;  Location: [facility];  Service: Orthopedics;  Laterality: Right;  . KYPHOPLASTY N/A 03/16/2020   Procedure: KYPHOPLASTY THORACIC THREE, THORACIC FOUR, THORACIC SIX;   Surgeon: Jovita Gamma, MD;  Location: [facility];  Service: Neurosurgery;  Laterality: N/A;  . left rotator cuff repair Left    12/2006  . LUMBAR LAMINECTOMY    . TONSILLECTOMY      FAMILY HISTORY: Family History  Problem Relation Age of Onset  . Diabetes Mother   . Breast cancer Mother        dx. late 91s-early 50s; "metastasis to throat"?  . Hodgkin's lymphoma Father 71  . Breast cancer Sister        maternal half-sister dx. w/ DCIS in her late 31s;  . Other Sister        learning disabilities; lives on her own  . Diabetes Sister   . Breast cancer Maternal Aunt        dx. 58s  . Ovarian cancer Maternal Aunt 11  . Breast cancer Paternal Aunt        dx. early 3s  . Stroke Maternal Grandfather   . Depression Paternal Grandmother   . Heart Problems Paternal Grandmother   . Alcohol abuse Brother   . Skin cancer Daughter        dx. early 58s  . Other Daughter        insterstitial cystitis  . Heart Problems Paternal Aunt     SOCIAL HISTORY: Social History   Socioeconomic History  . Marital status: Married    Spouse name: Not on file  . Number of children: 2  . Years of education: 22  . Highest education level: Not on file  Occupational History  . Occupation: retired  Tobacco Use  . Smoking status: Never Smoker  . Smokeless tobacco: Never Used  Vaping Use  . Vaping  Use: Never used  Substance and Sexual Activity  . Alcohol use: No  . Drug use: Yes    Comment: CBD- last time - 03/15/2020  . Sexual activity: Not on file  Other Topics Concern  . Not on file  Social History Narrative   Patient is right handed.   Patient drinks 2-3 cups of caffeine per day.   Social Determinants of Health   Financial Resource Strain: Not on file  Food Insecurity: Not on file  Transportation Needs: Not on file  Physical Activity: Not on file  Stress: Not on file  Social Connections: Not on file  Intimate Partner Violence: Not on file      PHYSICAL EXAM  Vitals:    04/12/21 1321  BP: (!) 150/88  Pulse: 76  Weight: 164 lb (74.4 kg)  Height: 4\' 11"  (1.499 m)   Body mass index is 33.12 kg/m.  Generalized: Well developed, in no acute distress   Neurological examination  Mentation: Alert oriented to time, place, history taking. Follows all commands speech and language fluent Cranial nerve II-XII: Pupils were equal round reactive to light. Extraocular movements were full, visual field were full on confrontational test. Facial sensation and strength were normal. Uvula tongue midline. Head turning and shoulder shrug  were normal and symmetric. Motor: The motor testing reveals 5 over 5 strength of all 4 extremities. Good symmetric motor tone is noted throughout.  Sensory: Sensory testing is intact to soft touch on all 4 extremities.  Pinprick sensation decreased in the lower extremities.  Right greater than left Coordination: Cerebellar testing reveals good finger-nose-finger and heel-to-shin bilaterally.  Gait and station: Gait is normal.  Reflexes: Deep tendon reflexes are symmetric and normal bilaterally.   DIAGNOSTIC DATA (LABS, IMAGING, TESTING) - I reviewed patient records, labs, notes, testing and imaging myself where available.  Lab Results  Component Value Date   WBC 10.1 01/05/2021   HGB 14.1 01/05/2021   HCT 39.8 01/05/2021   MCV 87.5 01/05/2021   PLT 392 01/05/2021      Component Value Date/Time   NA 139 01/05/2021 1250   NA 141 11/30/2016 1358   K 2.8 (L) 01/05/2021 1250   K 3.5 11/30/2016 1358   CL 97 (L) 01/05/2021 1250   CO2 29 01/05/2021 1250   CO2 29 11/30/2016 1358   GLUCOSE 106 (H) 01/05/2021 1250   GLUCOSE 103 11/30/2016 1358   BUN 16 01/05/2021 1250   BUN 14.2 11/30/2016 1358   CREATININE 0.91 01/05/2021 1250   CREATININE 0.8 11/30/2016 1358   CALCIUM 9.6 01/05/2021 1250   CALCIUM 9.5 11/30/2016 1358   PROT 6.9 01/05/2021 1250   PROT 7.1 11/30/2016 1358   ALBUMIN 3.6 01/05/2021 1250   ALBUMIN 4.0 11/30/2016 1358    AST 22 01/05/2021 1250   AST 25 11/30/2016 1358   ALT 21 01/05/2021 1250   ALT 20 11/30/2016 1358   ALKPHOS 66 01/05/2021 1250   ALKPHOS 71 11/30/2016 1358   BILITOT 0.5 01/05/2021 1250   BILITOT 0.55 11/30/2016 1358   GFRNONAA >60 01/05/2021 1250   GFRAA >60 03/16/2020 0717      ASSESSMENT AND PLAN 80 y.o. year old female  has a past medical history of Allergy, Arthritis, Bladder infection, Breast cancer ([place]) (10/25/15), Cancer ([place]), Complication of anesthesia, Diastolic dysfunction, Dyspnea, Fibromyalgia, GERD (gastroesophageal reflux disease), Hereditary and idiopathic peripheral neuropathy (02/10/2015), Hypertension, Hypothyroidism, Obesity, PONV (postoperative nausea and vomiting), Reflux, Tachycardia ( ), and Yeast infection. here with:  1.  Peripheral  neuropathy  -Advised that she should take gabapentin 300 mg in the morning and 1200 mg at bedtime.  Encouraged the patient take her nighttime dose 1 hour before bedtime -Continue using compounded cream 1 hour before bedtime -Encouraged her to send notes from Dr. Wynelle Link and Dr. Saintclair Halsted to our office for review -Advised if symptoms worsen or she develops new symptoms she should let us know -Follow-up in 6 months or sooner if needed  I spent 30 minutes of face-to-face and non-face-to-face time with patient.  This included previsit chart review, treatment options for neuropathy and discussion about new symptoms and her visits with Dr. Phillip Heal and Dr. Reynaldo Minium.  Ward Givens, MSN, NP-C 04/12/2021, 1:41 PM [facility] [address] [place], [place] [phone]

## 2021-04-12 NOTE — Progress Notes (Signed)
I have read the note, and I agree with the clinical assessment and plan.  Madelene Kaatz K Jayle Solarz   

## 2021-04-17 DIAGNOSIS — E7800 Pure hypercholesterolemia, unspecified: Secondary | ICD-10-CM | POA: Diagnosis not present

## 2021-04-17 DIAGNOSIS — E876 Hypokalemia: Secondary | ICD-10-CM | POA: Diagnosis not present

## 2021-04-27 IMAGING — RF DG C-ARM 1-60 MIN
2 series · 2 of 2 positions shown · IV contrast (agent unspecified)
Comparison: 03/11/2020

CLINICAL DATA: Intraoperative fluoroscopy, T3, T4, and T6
kyphoplasty

EXAM:
THORACIC SPINE 2 VIEWS; DG C-ARM 1-60 MIN
CONTRAST:  None.
FLUOROSCOPY TIME:  Fluoroscopy Time:  [DATE]
Radiation Exposure Index (if provided by the fluoroscopic device):
Not provided
Number of Acquired Spot Images: 2

[Series 1: run · 1 of 1 slices shown (1 of 2)]
[im 1/1]
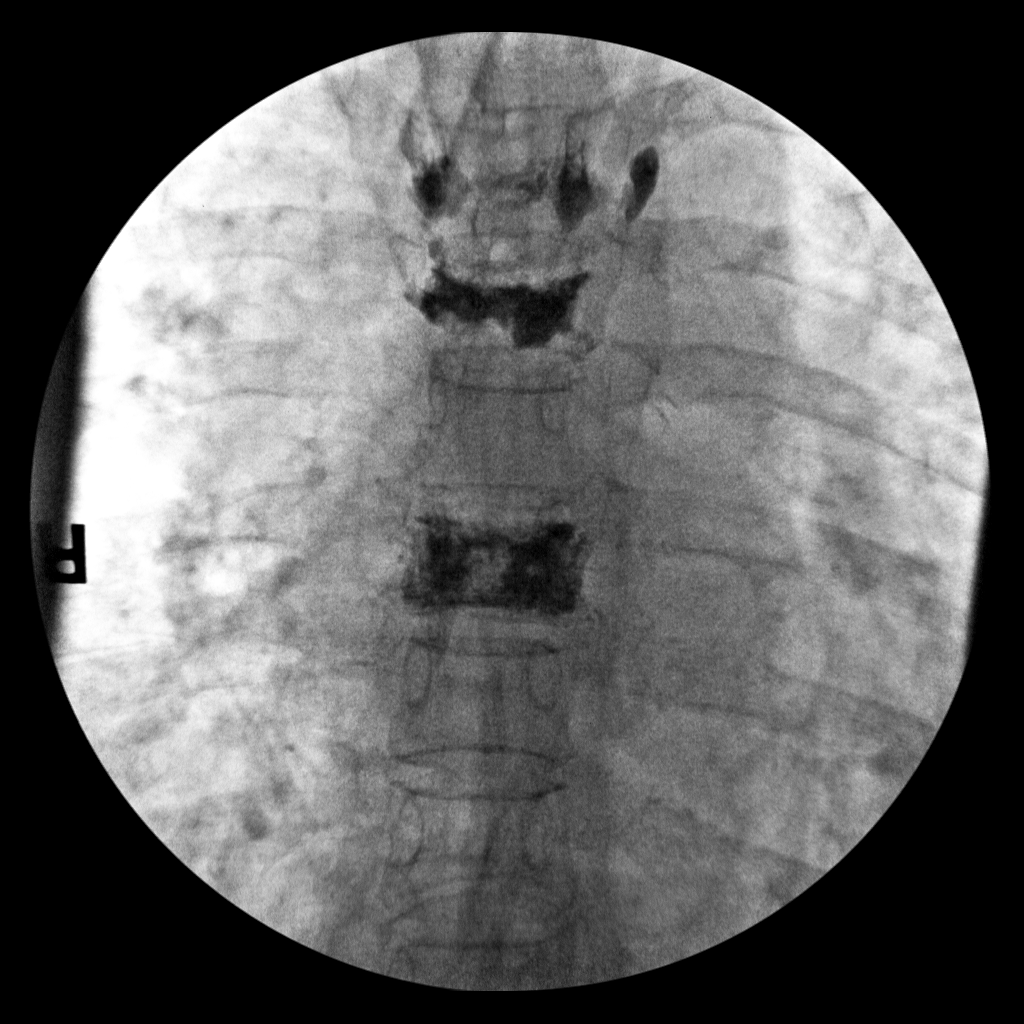

[Series 1: run · 1 of 1 slices shown (2 of 2)]
[im 1/1]
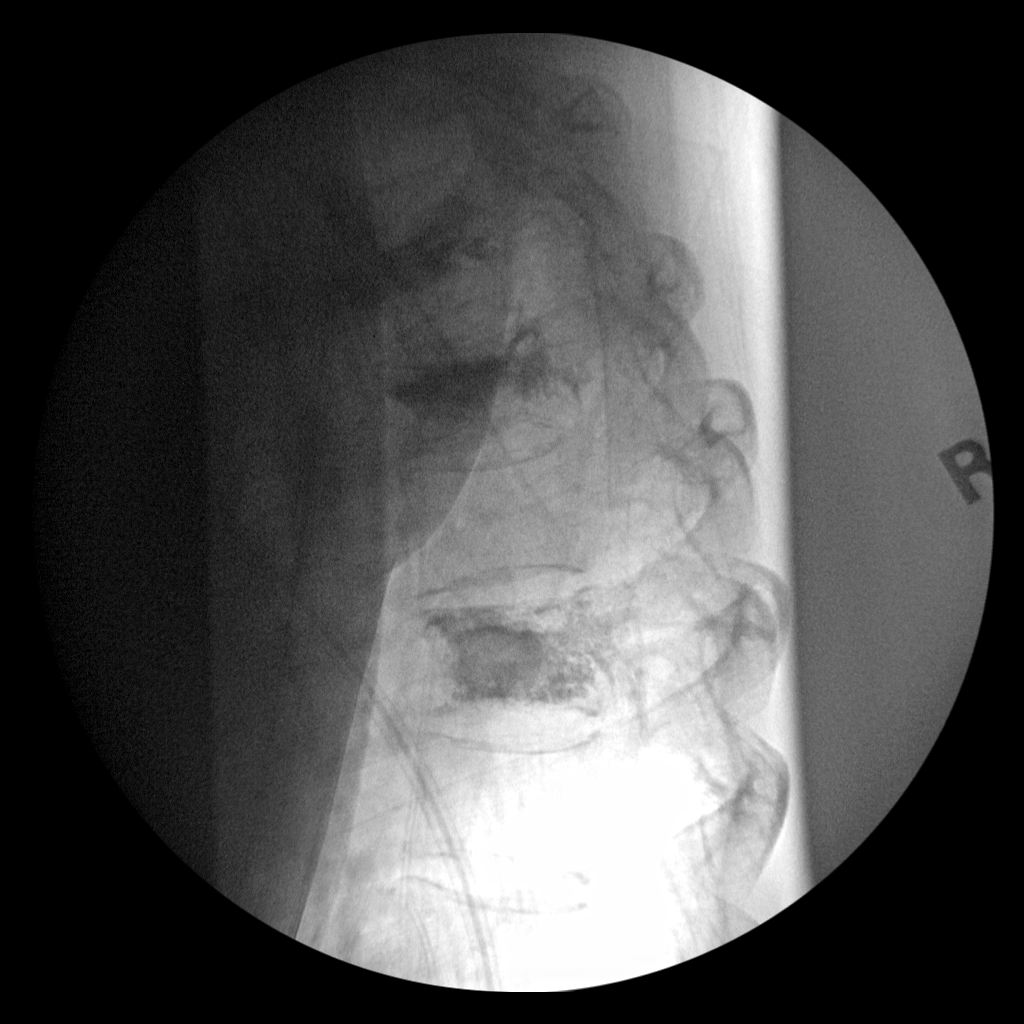

[2 of 2 positions shown; findings below may reference images not displayed]

FINDINGS: Intraoperative fluoroscopic views are provided demonstrating
kyphoplasty of 3 thoracic vertebral bodies, by report T3, T4, and
T6.
IMPRESSION: Intraoperative fluoroscopic views are provided demonstrating
kyphoplasty of 3 thoracic vertebral bodies, by report T3, T4, and
T6.

## 2021-04-27 IMAGING — RF DG THORACIC SPINE 2V
1 series · 1 of 1 positions shown · IV contrast (agent unspecified)
Comparison: 03/11/2020

CLINICAL DATA: Intraoperative fluoroscopy, T3, T4, and T6
kyphoplasty

EXAM:
THORACIC SPINE 2 VIEWS; DG C-ARM 1-60 MIN
CONTRAST:  None.
FLUOROSCOPY TIME:  Fluoroscopy Time:  [DATE]
Radiation Exposure Index (if provided by the fluoroscopic device):
Not provided
Number of Acquired Spot Images: 2

[Series 1: run · 1 of 1 slices shown]
[im 1/1]
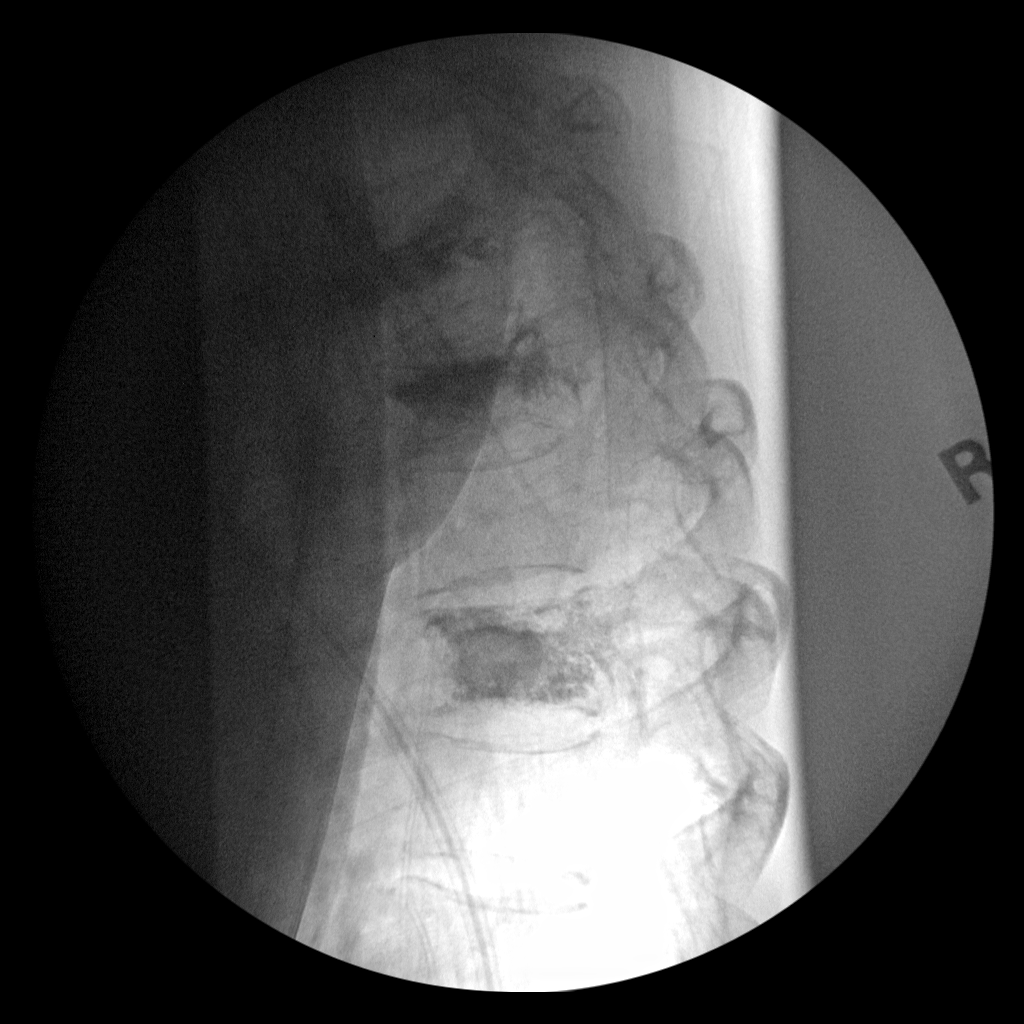

[1 of 1 positions shown; findings below may reference images not displayed]

FINDINGS: Intraoperative fluoroscopic views are provided demonstrating
kyphoplasty of 3 thoracic vertebral bodies, by report T3, T4, and
T6.
IMPRESSION: Intraoperative fluoroscopic views are provided demonstrating
kyphoplasty of 3 thoracic vertebral bodies, by report T3, T4, and
T6.

## 2021-05-22 ENCOUNTER — Telehealth: Payer: Self-pay | Admitting: Adult Health

## 2021-05-22 NOTE — Telephone Encounter (Signed)
Pt called, husband dropped off copy of MRI from Dr. Saintclair Halsted at front desk three weeks ago. Have not heard anything. Would like a call from the nurse.

## 2021-05-23 NOTE — Telephone Encounter (Signed)
LMVM for pt after MM/NP stated that after looking at images she is not sure what is causing sx and recommended if still having sx to see pcp for guidance on where to go from there.  She is to call back if questions.

## 2021-06-01 NOTE — Telephone Encounter (Signed)
DVD's to medical records.

## 2021-06-12 DIAGNOSIS — C50412 Malignant neoplasm of upper-outer quadrant of left female breast: Secondary | ICD-10-CM | POA: Diagnosis not present

## 2021-06-12 DIAGNOSIS — Z170 Estrogen receptor positive status [ER+]: Secondary | ICD-10-CM | POA: Diagnosis not present

## 2021-06-22 DIAGNOSIS — Z981 Arthrodesis status: Secondary | ICD-10-CM | POA: Diagnosis not present

## 2021-06-22 DIAGNOSIS — M48062 Spinal stenosis, lumbar region with neurogenic claudication: Secondary | ICD-10-CM | POA: Diagnosis not present

## 2021-06-23 ENCOUNTER — Other Ambulatory Visit: Payer: Self-pay | Admitting: Neurosurgery

## 2021-06-23 DIAGNOSIS — M48062 Spinal stenosis, lumbar region with neurogenic claudication: Secondary | ICD-10-CM

## 2021-07-04 ENCOUNTER — Other Ambulatory Visit: Payer: Self-pay | Admitting: *Deleted

## 2021-07-04 MED ORDER — GABAPENTIN 300 MG PO CAPS: ORAL_CAPSULE | ORAL | 1 refills | Status: DC

## 2021-07-13 ENCOUNTER — Other Ambulatory Visit: Payer: Self-pay

## 2021-07-13 ENCOUNTER — Ambulatory Visit
Admission: RE | Admit: 2021-07-13 | Discharge: 2021-07-13 | Disposition: A | Discharge status: Home / Self Care | Payer: PPO | Source: Ambulatory Visit | Attending: Neurosurgery | Admitting: Neurosurgery

## 2021-07-13 DIAGNOSIS — M545 Low back pain, unspecified: Secondary | ICD-10-CM | POA: Diagnosis not present

## 2021-07-13 DIAGNOSIS — M48062 Spinal stenosis, lumbar region with neurogenic claudication: Secondary | ICD-10-CM

## 2021-07-21 DIAGNOSIS — Z6832 Body mass index (BMI) 32.0-32.9, adult: Secondary | ICD-10-CM | POA: Diagnosis not present

## 2021-07-21 DIAGNOSIS — M48062 Spinal stenosis, lumbar region with neurogenic claudication: Secondary | ICD-10-CM | POA: Diagnosis not present

## 2021-07-21 DIAGNOSIS — I10 Essential (primary) hypertension: Secondary | ICD-10-CM | POA: Diagnosis not present

## 2021-08-01 DIAGNOSIS — M5416 Radiculopathy, lumbar region: Secondary | ICD-10-CM | POA: Diagnosis not present

## 2021-08-16 DIAGNOSIS — H353112 Nonexudative age-related macular degeneration, right eye, intermediate dry stage: Secondary | ICD-10-CM | POA: Diagnosis not present

## 2021-08-16 DIAGNOSIS — D3132 Benign neoplasm of left choroid: Secondary | ICD-10-CM | POA: Diagnosis not present

## 2021-08-16 DIAGNOSIS — Z961 Presence of intraocular lens: Secondary | ICD-10-CM | POA: Diagnosis not present

## 2021-08-16 DIAGNOSIS — H52203 Unspecified astigmatism, bilateral: Secondary | ICD-10-CM | POA: Diagnosis not present

## 2021-08-21 DIAGNOSIS — G629 Polyneuropathy, unspecified: Secondary | ICD-10-CM | POA: Diagnosis not present

## 2021-08-21 DIAGNOSIS — E559 Vitamin D deficiency, unspecified: Secondary | ICD-10-CM | POA: Diagnosis not present

## 2021-08-21 DIAGNOSIS — I700 Atherosclerosis of aorta: Secondary | ICD-10-CM | POA: Diagnosis not present

## 2021-08-21 DIAGNOSIS — Z1389 Encounter for screening for other disorder: Secondary | ICD-10-CM | POA: Diagnosis not present

## 2021-08-21 DIAGNOSIS — M5136 Other intervertebral disc degeneration, lumbar region: Secondary | ICD-10-CM | POA: Diagnosis not present

## 2021-08-21 DIAGNOSIS — E039 Hypothyroidism, unspecified: Secondary | ICD-10-CM | POA: Diagnosis not present

## 2021-08-21 DIAGNOSIS — Z853 Personal history of malignant neoplasm of breast: Secondary | ICD-10-CM | POA: Diagnosis not present

## 2021-08-21 DIAGNOSIS — Z23 Encounter for immunization: Secondary | ICD-10-CM | POA: Diagnosis not present

## 2021-08-21 DIAGNOSIS — I471 Supraventricular tachycardia: Secondary | ICD-10-CM | POA: Diagnosis not present

## 2021-08-21 DIAGNOSIS — I10 Essential (primary) hypertension: Secondary | ICD-10-CM | POA: Diagnosis not present

## 2021-08-21 DIAGNOSIS — Z0000 Encounter for general adult medical examination without abnormal findings: Secondary | ICD-10-CM | POA: Diagnosis not present

## 2021-08-21 DIAGNOSIS — E7800 Pure hypercholesterolemia, unspecified: Secondary | ICD-10-CM | POA: Diagnosis not present

## 2021-08-24 DIAGNOSIS — M5416 Radiculopathy, lumbar region: Secondary | ICD-10-CM | POA: Diagnosis not present

## 2021-08-25 ENCOUNTER — Other Ambulatory Visit: Payer: Self-pay | Admitting: Neurosurgery

## 2021-08-29 DIAGNOSIS — M48062 Spinal stenosis, lumbar region with neurogenic claudication: Secondary | ICD-10-CM | POA: Diagnosis not present

## 2021-08-29 DIAGNOSIS — M4316 Spondylolisthesis, lumbar region: Secondary | ICD-10-CM | POA: Diagnosis not present

## 2021-08-30 NOTE — Progress Notes (Signed)
Surgical Instructions    Your procedure is scheduled on Monday October 10th.  Report to [facility] Main Entrance "A" at 5:30 A.M., then check in with the Admitting office.  Call this number if you have problems the morning of surgery:  [phone]   If you have any questions prior to your surgery date call [phone]: Open Monday-Friday 8am-4pm    Remember:  Do not eat after midnight the night before your surgery  You may drink clear liquids until 4:30am the morning of your surgery.   Clear liquids allowed are: Water, Non-Citrus Juices (without pulp), Carbonated Beverages, Clear Tea, Black Coffee ONLY (NO MILK, CREAM OR POWDERED CREAMER of any kind), and Gatorade    Take these medicines the morning of surgery with A SIP OF WATER gabapentin (NEURONTIN) 300 MG capsule levothyroxine (SYNTHROID, LEVOTHROID) 25 MCG tablet nebivolol (BYSTOLIC) 5 MG tablet potassium chloride (KLOR-CON) 10 MEQ tablet  IF NEEDED acetaminophen (TYLENOL) 500 MG tablet loratadine (CLARITIN) 10 MG tablet methenamine (HIPREX) 1 g tablet oxyCODONE-acetaminophen (PERCOCET/ROXICET) 5-325 MG tablet  After your COVID test   You are not required to quarantine however you are required to wear a well-fitting mask when you are out and around people not in your household.  If your mask becomes wet or soiled, replace with a new one.  Wash your hands often with soap and water for 20 seconds or clean your hands with an alcohol-based hand sanitizer that contains at least 60% alcohol.  Do not share personal items.  Notify your provider: if you are in close contact with someone who has COVID  or if you develop a fever of 100.4 or greater, sneezing, cough, sore throat, shortness of breath or body aches.    As of today, STOP taking any Aspirin (unless otherwise instructed by your surgeon) Aleve, Naproxen, Ibuprofen, Motrin, Advil, Goody's, BC's, all herbal medications, fish oil, and all vitamins.          Do not wear  jewelry or makeup Do not wear lotions, powders, perfumes, or deodorant. Do not shave 48 hours prior to surgery.   Do not bring valuables to the hospital. DO Not wear nail polish, gel polish, artificial nails, or any other type of covering on natural nails including finger and toenails. If patients have artificial nails, gel coating, etc. that need to be removed by a nail salon please have this removed prior to surgery or surgery may need to be canceled/delayed if the surgeon/ anesthesia feels like the patient is unable to be adequately monitored.             [facility] is not responsible for any belongings or valuables.  Do NOT Smoke (Tobacco/Vaping)  24 hours prior to your procedure If you use a CPAP at night, you may bring your mask for your overnight stay.   Contacts, glasses, dentures or bridgework may not be worn into surgery, please bring cases for these belongings   For patients admitted to the hospital, discharge time will be determined by your treatment team.   Patients discharged the day of surgery will not be allowed to drive home, and someone needs to stay with them for 24 hours.  NO VISITORS WILL BE ALLOWED IN PRE-OP WHERE PATIENTS ARE PREPPED FOR SURGERY.  ONLY 1 SUPPORT PERSON MAY BE PRESENT IN THE WAITING ROOM WHILE YOU ARE IN SURGERY.  IF YOU ARE TO BE ADMITTED, ONCE YOU ARE IN YOUR ROOM YOU WILL BE ALLOWED TWO (2) VISITORS. 1 (ONE) VISITOR MAY STAY  OVERNIGHT BUT MUST ARRIVE TO THE ROOM BY 8pm.  Minor children may have two parents present. Special consideration for safety and communication needs will be reviewed on a case by case basis.  Special instructions:    Oral Hygiene is also important to reduce your risk of infection.  Remember - BRUSH YOUR TEETH THE MORNING OF SURGERY WITH YOUR REGULAR TOOTHPASTE   [facility]- Preparing For Surgery  Before surgery, you can play an important role. Because skin is not sterile, your skin needs to be as free of germs as possible.  You can reduce the number of germs on your skin by washing with CHG (chlorahexidine gluconate) Soap before surgery.  CHG is an antiseptic cleaner which kills germs and bonds with the skin to continue killing germs even after washing.     Please do not use if you have an allergy to CHG or antibacterial soaps. If your skin becomes reddened/irritated stop using the CHG.  Do not shave (including legs and underarms) for at least 48 hours prior to first CHG shower. It is OK to shave your face.  Please follow these instructions carefully.     Shower the NIGHT BEFORE SURGERY and the MORNING OF SURGERY with CHG Soap.   If you chose to wash your hair, wash your hair first as usual with your normal shampoo. After you shampoo, rinse your hair and body thoroughly to remove the shampoo.  Then [employer] and genitals (private parts) with your normal soap and rinse thoroughly to remove soap.  After that Use CHG Soap as you would any other liquid soap. You can apply CHG directly to the skin and wash gently with a scrungie or a clean washcloth.   Apply the CHG Soap to your body ONLY FROM THE NECK DOWN.  Do not use on open wounds or open sores. Avoid contact with your eyes, ears, mouth and genitals (private parts). Wash Face and genitals (private parts)  with your normal soap.   Wash thoroughly, paying special attention to the area where your surgery will be performed.  Thoroughly rinse your body with warm water from the neck down.  DO NOT shower/wash with your normal soap after using and rinsing off the CHG Soap.  Pat yourself dry with a CLEAN TOWEL.  Wear CLEAN PAJAMAS to bed the night before surgery  Place CLEAN SHEETS on your bed the night before your surgery  DO NOT SLEEP WITH PETS.   Day of Surgery:  Take a shower with CHG soap. Wear Clean/Comfortable clothing the morning of surgery Do not apply any deodorants/lotions.   Remember to brush your teeth WITH YOUR REGULAR TOOTHPASTE.   Please read  over the following fact sheets that you were given.

## 2021-08-31 ENCOUNTER — Other Ambulatory Visit: Payer: Self-pay

## 2021-08-31 ENCOUNTER — Encounter (HOSPITAL_COMMUNITY): Payer: Self-pay

## 2021-08-31 ENCOUNTER — Encounter (HOSPITAL_COMMUNITY)
Admission: RE | Admit: 2021-08-31 | Discharge: 2021-08-31 | Disposition: A | Discharge status: Home / Self Care | Payer: PPO | Source: Ambulatory Visit | Attending: Neurosurgery | Admitting: Neurosurgery

## 2021-08-31 DIAGNOSIS — Z79890 Hormone replacement therapy (postmenopausal): Secondary | ICD-10-CM | POA: Diagnosis not present

## 2021-08-31 DIAGNOSIS — G609 Hereditary and idiopathic neuropathy, unspecified: Secondary | ICD-10-CM | POA: Diagnosis not present

## 2021-08-31 DIAGNOSIS — R000 Tachycardia, unspecified: Secondary | ICD-10-CM | POA: Insufficient documentation

## 2021-08-31 DIAGNOSIS — I119 Hypertensive heart disease without heart failure: Secondary | ICD-10-CM | POA: Diagnosis not present

## 2021-08-31 DIAGNOSIS — Z20822 Contact with and (suspected) exposure to covid-19: Secondary | ICD-10-CM | POA: Diagnosis not present

## 2021-08-31 DIAGNOSIS — Z01818 Encounter for other preprocedural examination: Secondary | ICD-10-CM | POA: Insufficient documentation

## 2021-08-31 DIAGNOSIS — E669 Obesity, unspecified: Secondary | ICD-10-CM | POA: Diagnosis not present

## 2021-08-31 DIAGNOSIS — Z6834 Body mass index (BMI) 34.0-34.9, adult: Secondary | ICD-10-CM | POA: Diagnosis not present

## 2021-08-31 DIAGNOSIS — M48061 Spinal stenosis, lumbar region without neurogenic claudication: Secondary | ICD-10-CM | POA: Insufficient documentation

## 2021-08-31 DIAGNOSIS — Z853 Personal history of malignant neoplasm of breast: Secondary | ICD-10-CM | POA: Insufficient documentation

## 2021-08-31 DIAGNOSIS — Z79899 Other long term (current) drug therapy: Secondary | ICD-10-CM | POA: Diagnosis not present

## 2021-08-31 LAB — SARS CORONAVIRUS 2 BY RT PCR (HOSPITAL ORDER, PERFORMED IN ~~LOC~~ HOSPITAL LAB): SARS Coronavirus 2: NEGATIVE

## 2021-08-31 LAB — BASIC METABOLIC PANEL
Anion gap: 9 (ref 5–15)
BUN: 14 mg/dL (ref 8–23)
CO2: 31 mmol/L (ref 22–32)
Calcium: 9.4 mg/dL (ref 8.9–10.3)
Chloride: 96 mmol/L — ABNORMAL LOW (ref 98–111)
Creatinine, Ser: 0.88 mg/dL (ref 0.44–1.00)
GFR, Estimated: >60 mL/min (ref >60)
Glucose, Bld: 93 mg/dL (ref 70–99)
Potassium: 2.9 mmol/L — ABNORMAL LOW (ref 3.5–5.1)
Sodium: 136 mmol/L (ref 135–145)

## 2021-08-31 LAB — CBC
HCT: 40.8 % (ref 36.0–46.0)
Hemoglobin: 14.3 g/dL (ref 12.0–15.0)
MCH: 30.3 pg (ref 26.0–34.0)
MCHC: 35 g/dL (ref 30.0–36.0)
MCV: 86.4 fL (ref 80.0–100.0)
Platelets: 422 10*3/uL — ABNORMAL HIGH (ref 150–400)
RBC: 4.72 MIL/uL (ref 3.87–5.11)
RDW: 13.3 % (ref 11.5–15.5)
WBC: 13 10*3/uL — ABNORMAL HIGH (ref 4.0–10.5)
nRBC: 0 % (ref 0.0–0.2)

## 2021-08-31 LAB — TYPE AND SCREEN
ABO/RH(D): O POS
Antibody Screen: NEGATIVE

## 2021-08-31 LAB — SURGICAL PCR SCREEN
MRSA, PCR: NEGATIVE
Staphylococcus aureus: NEGATIVE

## 2021-08-31 NOTE — Progress Notes (Signed)
PCP - Dr. Kelton Pillar Cardiologist - Dr. Golden Hurter Neuro: Dr. Margette Fast Urology: Dr. Louis Meckel Oncology: Dr. Truitt Merle  PPM/ICD - Denies  Chest x-ray - N/A EKG - 08/31/21 Stress Test - 04/16/12 ECHO - 04/16/12 Cardiac Cath - Denies  Sleep Study - Denies  Patient denies having diabetes.  Blood Thinner Instructions: N/A Aspirin Instructions: N/A  ERAS Protcol - No  COVID TEST- Done in PAT 08/31/21  Patient stated that she had a physical done with her PCP on 08/21/21. Records requested from [facility].   Anesthesia review: Yes, cardiac hx, abnormal EKG.  Patient denies shortness of breath, fever, cough and chest pain at PAT appointment   All instructions explained to the patient, with a verbal understanding of the material. Patient agrees to go over the instructions while at home for a better understanding. Patient also instructed to self quarantine after being tested for COVID-19. The opportunity to ask questions was provided.

## 2021-08-31 NOTE — Progress Notes (Addendum)
Surgical Instructions    Your procedure is scheduled on Monday, September 04, 2021 at 7:30 AM.  Report to [facility] Main Entrance "A" at 5:30 A.M., then check in with the Admitting office.  Call this number if you have problems the morning of surgery:  [phone]   If you have any questions prior to your surgery date call [phone]: Open Monday-Friday 8am-4pm    Remember:  Do not eat or drink after midnight the night before your surgery    Take these medicines the morning of surgery with A SIP OF WATER:  gabapentin (NEURONTIN) 300 MG capsule levothyroxine (SYNTHROID, LEVOTHROID) 25 MCG tablet nebivolol (BYSTOLIC) 5 MG tablet  IF NEEDED acetaminophen (TYLENOL) 500 MG tablet loratadine (CLARITIN) 10 MG tablet methenamine (HIPREX) 1 g tablet oxyCODONE-acetaminophen (PERCOCET/ROXICET) 5-325 MG tablet Polyethyl Glyc-Propyl Glyc PF (SYSTANE ULTRA PF)  As of today, STOP taking any Aspirin (unless otherwise instructed by your surgeon) Aleve, Naproxen, Ibuprofen, Motrin, Advil, Goody's, BC's, all herbal medications, fish oil, and all vitamins.                     Do NOT Smoke (Tobacco/Vaping) or drink Alcohol 24 hours prior to your procedure.  If you use a CPAP at night, you may bring all equipment for your overnight stay.   Contacts, glasses, piercing's, hearing aid's, dentures or partials may not be worn into surgery, please bring cases for these belongings.    For patients admitted to the hospital, discharge time will be determined by your treatment team.   Patients discharged the day of surgery will not be allowed to drive home, and someone needs to stay with them for 24 hours.  NO VISITORS WILL BE ALLOWED IN PRE-OP WHERE PATIENTS GET READY FOR SURGERY.  ONLY 1 SUPPORT PERSON MAY BE PRESENT IN THE WAITING ROOM WHILE YOU ARE IN SURGERY.  IF YOU ARE TO BE ADMITTED, ONCE YOU ARE IN YOUR ROOM YOU WILL BE ALLOWED TWO (2) VISITORS.  Minor children may have two parents present.  Special consideration for safety and communication needs will be reviewed on a case by case basis.   Special instructions:   [place]- Preparing For Surgery  Before surgery, you can play an important role. Because skin is not sterile, your skin needs to be as free of germs as possible. You can reduce the number of germs on your skin by washing with CHG (chlorahexidine gluconate) Soap before surgery.  CHG is an antiseptic cleaner which kills germs and bonds with the skin to continue killing germs even after washing.    Oral Hygiene is also important to reduce your risk of infection.  Remember - BRUSH YOUR TEETH THE MORNING OF SURGERY WITH YOUR REGULAR TOOTHPASTE  Please do not use if you have an allergy to CHG or antibacterial soaps. If your skin becomes reddened/irritated stop using the CHG.  Do not shave (including legs and underarms) for at least 48 hours prior to first CHG shower. It is OK to shave your face.  Please follow these instructions carefully.   Shower the NIGHT BEFORE SURGERY and the MORNING OF SURGERY  If you chose to wash your hair, wash your hair first as usual with your normal shampoo.  After you shampoo, rinse your hair and body thoroughly to remove the shampoo.  Use CHG Soap as you would any other liquid soap. You can apply CHG directly to the skin and wash gently with a scrungie or a clean washcloth.   Apply  the CHG Soap to your body ONLY FROM THE NECK DOWN.  Do not use on open wounds or open sores. Avoid contact with your eyes, ears, mouth and genitals (private parts). Wash Face and genitals (private parts)  with your normal soap.   Wash thoroughly, paying special attention to the area where your surgery will be performed.  Thoroughly rinse your body with warm water from the neck down.  DO NOT shower/wash with your normal soap after using and rinsing off the CHG Soap.  Pat yourself dry with a CLEAN TOWEL.  Wear CLEAN PAJAMAS to bed the night before  surgery  Place CLEAN SHEETS on your bed the night before your surgery  DO NOT SLEEP WITH PETS.   Day of Surgery: Shower with CHG soap. Do not wear jewelry, make up, nail polish, gel polish, artificial nails, or any other type of covering on natural nails including finger and toenails. If patients have artificial nails, gel coating, etc. that need to be removed by a nail salon please have this removed prior to surgery. Surgery may need to be canceled/delayed if the surgeon/ anesthesia feels like the patient is unable to be adequately monitored. Do not wear lotions, powders, perfumes/colognes, or deodorant. Do not shave 48 hours prior to surgery.  Men may shave face and neck. Do not bring valuables to the hospital. [facility] is not responsible for any belongings or valuables. Wear Clean/Comfortable clothing the morning of surgery Remember to brush your teeth WITH YOUR REGULAR TOOTHPASTE.   Please read over the following fact sheets that you were given.   3 days prior to your procedure or After your COVID test   You are not required to quarantine however you are required to wear a well-fitting mask when you are out and around people not in your household. If your mask becomes wet or soiled, replace with a new one.   Wash your hands often with soap and water for 20 seconds or clean your hands with an alcohol-based hand sanitizer that contains at least 60% alcohol.   Do not share personal items.   Notify your provider:  o if you are in close contact with someone who has COVID  o or if you develop a fever of 100.4 or greater, sneezing, cough, sore throat, shortness of breath or body aches.

## 2021-09-01 NOTE — Anesthesia Preprocedure Evaluation (Addendum)
Anesthesia Evaluation  Patient identified by MRN, date of birth, ID band Patient awake    Reviewed: Allergy & Precautions, H&P , NPO status , Patient's Chart, lab work & pertinent test results  History of Anesthesia Complications (+) PONV  Airway Mallampati: II  TM Distance: >3 FB Neck ROM: Full    Dental no notable dental hx. (+) Teeth Intact, Dental Advisory Given   Pulmonary neg pulmonary ROS,    Pulmonary exam normal breath sounds clear to auscultation       Cardiovascular hypertension, Pt. on medications  Rhythm:Regular Rate:Normal     Neuro/Psych negative neurological ROS  negative psych ROS   GI/Hepatic Neg liver ROS, GERD  Medicated,  Endo/Other  Hypothyroidism   Renal/GU negative Renal ROS  negative genitourinary   Musculoskeletal  (+) Arthritis , Osteoarthritis,  Fibromyalgia -  Abdominal   Peds  Hematology negative hematology ROS (+)   Anesthesia Other Findings   Reproductive/Obstetrics negative OB ROS                           Anesthesia Physical Anesthesia Plan  ASA: 2  Anesthesia Plan: General   Post-op Pain Management:    Induction: Intravenous  PONV Risk Score and Plan: 4 or greater and Ondansetron, Dexamethasone and Aprepitant  Airway Management Planned: Oral ETT  Additional Equipment:   Intra-op Plan:   Post-operative Plan: Extubation in OR  Informed Consent: I have reviewed the patients History and Physical, chart, labs and discussed the procedure including the risks, benefits and alternatives for the proposed anesthesia with the patient or authorized representative who has indicated his/her understanding and acceptance.     Dental advisory given  Plan Discussed with: CRNA  Anesthesia Plan Comments: (See APP note by Durel Salts, FNP )      Anesthesia Quick Evaluation

## 2021-09-01 NOTE — Progress Notes (Signed)
Anesthesia Chart Review:   Case: [id number] Date/Time: 09/04/21 0715   Procedure: PLIF - L1-L2 - L2-L3 (Back)   Anesthesia type: General   Pre-op diagnosis: Stenosis   Location: [facility] OR ROOM 20 / [place] OR   Surgeons: Kary Kos, MD       DISCUSSION: Pt is 80 years old with hx tachycardia, HTN, diastolic dysfunction, breast cancer  K 2.9. Will recheck day of surgery.   Reviewed EKG with Dr. 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 This was suppressed with beta blockers but BB caused pt fatigue and she stopped them. Tachycardia did not recur and Dr. Theodosia Blender note 03/22/15 indicates tachycardia may have been caused by chronic pain. It appears pt restarted beta blocker at some point (as she is currently taking bystolic)   - Neurologist is Margette Fast, MD who sees pt for peripheral neuropathy - Urologist is Louis Meckel, MD - Oncologist is Truitt Merle, MD   LABS:  - K is 2.9. Pt takes KCl and HCTZ.  Dr. Windy Carina office spoke with PCP's office to have K addressed before surgery. Will Recheck K day of surgery   (all labs ordered are listed, but only abnormal results are displayed)  Labs Reviewed  CBC - Abnormal; Notable for the following components:      Result Value   WBC 13.0 (*)    Platelets 422 (*)    All other components within normal limits  BASIC METABOLIC PANEL - Abnormal; Notable for the following components:   Potassium 2.9 (*)    Chloride 96 (*)    All other components within normal limits  SARS CORONAVIRUS 2 BY RT PCR (HOSPITAL ORDER, [place] [facility])  SURGICAL PCR SCREEN  TYPE AND SCREEN     EKG 08/31/21: NSR. ST & T wave abnormality, consider inferior ischemia. Appears stable when compared to EKG 03/16/20. Pt without  active CV symptoms at pre-admission testing.    CV: Echo (mild diastolic dysfunction, trace MR and TR) and normal stress test from 2013 are in [place].   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   Hypertension    Hypothyroidism    Obesity    PONV (postoperative nausea and vomiting)    Reflux    buring and dysphagia sx resp to PPI; egd 3/06 neg for Barretts   Tachycardia     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   Yeast infection    hx of     Past Surgical History:  Procedure Laterality Date   AXILLARY LYMPH NODE BIOPSY Left 12/27/2015   Procedure: LEFT AXILLARY SENTINEL LYMPH NODE  BIOPSY;  Surgeon: [name], MD;  Location: [place];  Service: General;  Laterality: Left;   BELPHAROPTOSIS REPAIR Bilateral    BREAST LUMPECTOMY WITH RADIOACTIVE SEED LOCALIZATION Left 12/01/2015   Procedure: LEFT BREAST [place] RADIOACTIVE SEED LOCALIZATION;  Surgeon: [name], MD;  Location: [place];  Service: General;  Laterality: Left;   CATARACT EXTRACTION, BILATERAL Bilateral 08/2018   COLONOSCOPY  3/06   NEG   cortisone injections     right arm; back   EYE SURGERY     cosmetic; eyelid surgery bilat    FINGER SURGERY Left    left index finger/ cyst 02/2007   KNEE ARTHROSCOPY Right 04/13/2015   Procedure: ARTHROSCOPY RIGHT KNEE WITH MEDIAL MENISCAL DEBRIDEMENT AND CHONDROPLASTY;  Surgeon: Gaynelle Arabian, MD;  Location: [facility];  Service: Orthopedics;  Laterality: Right;   KYPHOPLASTY N/A 03/16/2020   Procedure: KYPHOPLASTY THORACIC THREE, THORACIC  FOUR, THORACIC SIX;  Surgeon: Jovita Gamma, MD;  Location: [place];  Service: Neurosurgery;  Laterality: N/A;   left rotator cuff repair Left    12/2006   LUMBAR LAMINECTOMY     TONSILLECTOMY      MEDICATIONS:  acetaminophen (TYLENOL) 500 MG tablet   Calcium Carb-Cholecalciferol (CALCIUM + D3 PO)   cholecalciferol (VITAMIN D) 25 MCG (1000 UNIT) tablet   Co-Enzyme Q10 100 MG CAPS   CRANBERRY PO   Cyanocobalamin (VITAMIN B-12) 5000 MCG SUBL   gabapentin (NEURONTIN) 300 MG capsule   Glucos-Chondroit-Hyaluron-MSM (GLUCOSAMINE CHONDROITIN JOINT PO)   hydrochlorothiazide (HYDRODIURIL) 12.5 MG tablet   Lactobacillus-Inulin (CULTURELLE DIGESTIVE DAILY PO)   levothyroxine (SYNTHROID, LEVOTHROID) 25 MCG tablet   loratadine (CLARITIN) 10 MG tablet   Magnesium 200 MG CHEW   methenamine (HIPREX) 1 g tablet   Multiple Vitamins-Minerals (PRESERVISION AREDS 2) CAPS   naproxen sodium (ALEVE) 220 MG tablet   nebivolol (BYSTOLIC) 5 MG tablet   Omega-3 Fatty Acids (OMEGA 3 PO)   oxyCODONE-acetaminophen (PERCOCET/ROXICET) 5-325 MG tablet   Polyethyl Glyc-Propyl Glyc PF (SYSTANE ULTRA PF) 0.4-0.3 % SOLN   potassium chloride (KLOR-CON) 10 MEQ tablet   pyridOXINE (VITAMIN B-6) 100 MG tablet   vitamin C (ASCORBIC ACID) 500 MG tablet   No current facility-administered medications for this encounter.    If labs acceptable day of surgery, I anticipate pt can proceed with surgery as scheduled.  Willeen Cass, PhD, FNP-BC [facility] Short Stay Surgical Center/Anesthesiology Phone: [phone] 09/01/2021 10:11 AM

## 2021-09-04 ENCOUNTER — Inpatient Hospital Stay (HOSPITAL_COMMUNITY): Payer: PPO

## 2021-09-04 ENCOUNTER — Inpatient Hospital Stay (HOSPITAL_COMMUNITY): Payer: PPO | Admitting: Emergency Medicine

## 2021-09-04 ENCOUNTER — Inpatient Hospital Stay (HOSPITAL_COMMUNITY)
Admission: RE | Admit: 2021-09-04 | Discharge: 2021-09-05 | DRG: 455 | Disposition: A | Discharge status: Home / Self Care | Payer: PPO | Attending: Neurosurgery | Admitting: Neurosurgery

## 2021-09-04 ENCOUNTER — Other Ambulatory Visit: Payer: Self-pay

## 2021-09-04 ENCOUNTER — Encounter (HOSPITAL_COMMUNITY): Payer: Self-pay | Admitting: Neurosurgery

## 2021-09-04 ENCOUNTER — Encounter (HOSPITAL_COMMUNITY)
Admission: RE | Disposition: A | Discharge status: Home / Self Care | Payer: Self-pay | Source: Home / Self Care | Attending: Neurosurgery

## 2021-09-04 DIAGNOSIS — Z79899 Other long term (current) drug therapy: Secondary | ICD-10-CM

## 2021-09-04 DIAGNOSIS — M5136 Other intervertebral disc degeneration, lumbar region: Secondary | ICD-10-CM | POA: Diagnosis not present

## 2021-09-04 DIAGNOSIS — Z803 Family history of malignant neoplasm of breast: Secondary | ICD-10-CM | POA: Diagnosis not present

## 2021-09-04 DIAGNOSIS — Z823 Family history of stroke: Secondary | ICD-10-CM | POA: Diagnosis not present

## 2021-09-04 DIAGNOSIS — Z808 Family history of malignant neoplasm of other organs or systems: Secondary | ICD-10-CM

## 2021-09-04 DIAGNOSIS — Z91041 Radiographic dye allergy status: Secondary | ICD-10-CM

## 2021-09-04 DIAGNOSIS — M48062 Spinal stenosis, lumbar region with neurogenic claudication: Secondary | ICD-10-CM | POA: Diagnosis not present

## 2021-09-04 DIAGNOSIS — E039 Hypothyroidism, unspecified: Secondary | ICD-10-CM | POA: Diagnosis present

## 2021-09-04 DIAGNOSIS — Z853 Personal history of malignant neoplasm of breast: Secondary | ICD-10-CM

## 2021-09-04 DIAGNOSIS — Z79890 Hormone replacement therapy (postmenopausal): Secondary | ICD-10-CM

## 2021-09-04 DIAGNOSIS — I10 Essential (primary) hypertension: Secondary | ICD-10-CM | POA: Diagnosis present

## 2021-09-04 DIAGNOSIS — M48061 Spinal stenosis, lumbar region without neurogenic claudication: Secondary | ICD-10-CM | POA: Diagnosis present

## 2021-09-04 DIAGNOSIS — Z6833 Body mass index (BMI) 33.0-33.9, adult: Secondary | ICD-10-CM

## 2021-09-04 DIAGNOSIS — M4326 Fusion of spine, lumbar region: Secondary | ICD-10-CM | POA: Diagnosis not present

## 2021-09-04 DIAGNOSIS — Z807 Family history of other malignant neoplasms of lymphoid, hematopoietic and related tissues: Secondary | ICD-10-CM

## 2021-09-04 DIAGNOSIS — Z419 Encounter for procedure for purposes other than remedying health state, unspecified: Secondary | ICD-10-CM

## 2021-09-04 DIAGNOSIS — M47816 Spondylosis without myelopathy or radiculopathy, lumbar region: Secondary | ICD-10-CM | POA: Diagnosis present

## 2021-09-04 DIAGNOSIS — Z981 Arthrodesis status: Secondary | ICD-10-CM | POA: Diagnosis not present

## 2021-09-04 DIAGNOSIS — Z833 Family history of diabetes mellitus: Secondary | ICD-10-CM

## 2021-09-04 DIAGNOSIS — K219 Gastro-esophageal reflux disease without esophagitis: Secondary | ICD-10-CM | POA: Diagnosis present

## 2021-09-04 DIAGNOSIS — E669 Obesity, unspecified: Secondary | ICD-10-CM | POA: Diagnosis present

## 2021-09-04 DIAGNOSIS — M4726 Other spondylosis with radiculopathy, lumbar region: Secondary | ICD-10-CM | POA: Diagnosis not present

## 2021-09-04 DIAGNOSIS — M532X6 Spinal instabilities, lumbar region: Secondary | ICD-10-CM | POA: Diagnosis present

## 2021-09-04 DIAGNOSIS — Z881 Allergy status to other antibiotic agents status: Secondary | ICD-10-CM | POA: Diagnosis not present

## 2021-09-04 DIAGNOSIS — Z888 Allergy status to other drugs, medicaments and biological substances status: Secondary | ICD-10-CM | POA: Diagnosis not present

## 2021-09-04 DIAGNOSIS — Z8041 Family history of malignant neoplasm of ovary: Secondary | ICD-10-CM | POA: Diagnosis not present

## 2021-09-04 DIAGNOSIS — M5116 Intervertebral disc disorders with radiculopathy, lumbar region: Secondary | ICD-10-CM | POA: Diagnosis not present

## 2021-09-04 LAB — POCT I-STAT, CHEM 8
BUN: 11 mg/dL (ref 8–23)
Calcium, Ion: 1.03 mmol/L — ABNORMAL LOW (ref 1.15–1.40)
Chloride: 101 mmol/L (ref 98–111)
Creatinine, Ser: 0.9 mg/dL (ref 0.44–1.00)
Glucose, Bld: 84 mg/dL (ref 70–99)
HCT: 41 % (ref 36.0–46.0)
Hemoglobin: 13.9 g/dL (ref 12.0–15.0)
Potassium: 3.8 mmol/L (ref 3.5–5.1)
Sodium: 140 mmol/L (ref 135–145)
TCO2: 28 mmol/L (ref 22–32)

## 2021-09-04 SURGERY — POSTERIOR LUMBAR FUSION 2 LEVEL
Anesthesia: General | Site: Back

## 2021-09-04 MED ORDER — OMEGA 3 1000 MG PO CAPS: ORAL_CAPSULE | Freq: Every day | ORAL | Status: DC

## 2021-09-04 MED ORDER — HYDROXYZINE HCL 50 MG/ML IM SOLN: 50.0000 mg | Freq: Four times a day (QID) | INTRAMUSCULAR | Status: DC | PRN

## 2021-09-04 MED ORDER — GABAPENTIN 300 MG PO CAPS
300.0000 mg | ORAL_CAPSULE | Freq: Two times a day (BID) | ORAL | Status: DC
  Administered 2021-09-04 – 2021-09-05 (×2): 300 mg via ORAL
  Filled 2021-09-04 (×2): qty 1

## 2021-09-04 MED ORDER — ONDANSETRON HCL 4 MG/2ML IJ SOLN
INTRAMUSCULAR | Status: AC
  Filled 2021-09-04: qty 2

## 2021-09-04 MED ORDER — ACETAMINOPHEN 650 MG RE SUPP: 650.0000 mg | RECTAL | Status: DC | PRN

## 2021-09-04 MED ORDER — FENTANYL CITRATE (PF) 250 MCG/5ML IJ SOLN
INTRAMUSCULAR | Status: DC | PRN
  Administered 2021-09-04 (×5): 50 ug via INTRAVENOUS

## 2021-09-04 MED ORDER — THROMBIN 20000 UNITS EX SOLR
CUTANEOUS | Status: DC | PRN
  Administered 2021-09-04: 20 mL via TOPICAL

## 2021-09-04 MED ORDER — PHENYLEPHRINE HCL-NACL 20-0.9 MG/250ML-% IV SOLN
INTRAVENOUS | Status: DC | PRN
  Administered 2021-09-04: 25 ug/min via INTRAVENOUS

## 2021-09-04 MED ORDER — VITAMIN B-12 5000 MCG SL SUBL: 5000.0000 ug | SUBLINGUAL_TABLET | Freq: Every day | SUBLINGUAL | Status: DC

## 2021-09-04 MED ORDER — LIDOCAINE 2% (20 MG/ML) 5 ML SYRINGE
INTRAMUSCULAR | Status: AC
  Filled 2021-09-04: qty 5

## 2021-09-04 MED ORDER — MAGNESIUM 200 MG PO CHEW: 200.0000 mg | CHEWABLE_TABLET | Freq: Every day | ORAL | Status: DC

## 2021-09-04 MED ORDER — LACTATED RINGERS IV SOLN: INTRAVENOUS | Status: DC

## 2021-09-04 MED ORDER — POLYVINYL ALCOHOL 1.4 % OP SOLN
1.0000 [drp] | OPHTHALMIC | Status: DC | PRN
  Filled 2021-09-04: qty 15

## 2021-09-04 MED ORDER — PROPOFOL 10 MG/ML IV BOLUS
INTRAVENOUS | Status: DC | PRN
  Administered 2021-09-04: 100 mg via INTRAVENOUS

## 2021-09-04 MED ORDER — METHOCARBAMOL 500 MG PO TABS
500.0000 mg | ORAL_TABLET | Freq: Four times a day (QID) | ORAL | Status: DC | PRN
  Administered 2021-09-04: 500 mg via ORAL
  Filled 2021-09-04 (×2): qty 1

## 2021-09-04 MED ORDER — CULTURELLE DIGESTIVE DAILY PO CAPS: ORAL_CAPSULE | Freq: Every day | ORAL | Status: DC

## 2021-09-04 MED ORDER — VITAMIN B-12 1000 MCG PO TABS: 5000.0000 ug | ORAL_TABLET | Freq: Every day | ORAL | Status: DC

## 2021-09-04 MED ORDER — LIDOCAINE-EPINEPHRINE 1 %-1:100000 IJ SOLN
INTRAMUSCULAR | Status: DC | PRN
  Administered 2021-09-04: 10 mL

## 2021-09-04 MED ORDER — GLUCOSAMINE CHONDROITIN JOINT PO TABS: ORAL_TABLET | Freq: Two times a day (BID) | ORAL | Status: DC

## 2021-09-04 MED ORDER — MENTHOL 3 MG MT LOZG: 1.0000 | LOZENGE | OROMUCOSAL | Status: DC | PRN

## 2021-09-04 MED ORDER — CALCIUM CARBONATE-VITAMIN D 500-200 MG-UNIT PO TABS
1.0000 | ORAL_TABLET | Freq: Every day | ORAL | Status: DC
  Administered 2021-09-04 – 2021-09-05 (×2): 1 via ORAL
  Filled 2021-09-04 (×2): qty 1

## 2021-09-04 MED ORDER — AMISULPRIDE (ANTIEMETIC) 5 MG/2ML IV SOLN
INTRAVENOUS | Status: AC
  Filled 2021-09-04: qty 2

## 2021-09-04 MED ORDER — PHENOL 1.4 % MT LIQD: 1.0000 | OROMUCOSAL | Status: DC | PRN

## 2021-09-04 MED ORDER — OXYCODONE-ACETAMINOPHEN 5-325 MG PO TABS
1.0000 | ORAL_TABLET | ORAL | Status: DC | PRN
Start: 2021-09-04 — End: 2021-09-05
  Administered 2021-09-04: 2 via ORAL
  Administered 2021-09-04 – 2021-09-05 (×3): 1 via ORAL
  Filled 2021-09-04 (×2): qty 2
  Filled 2021-09-04 (×2): qty 1

## 2021-09-04 MED ORDER — CEFAZOLIN SODIUM-DEXTROSE 2-4 GM/100ML-% IV SOLN
INTRAVENOUS | Status: AC
  Filled 2021-09-04: qty 100

## 2021-09-04 MED ORDER — RISAQUAD PO CAPS
1.0000 | ORAL_CAPSULE | Freq: Every day | ORAL | Status: DC
  Administered 2021-09-04 – 2021-09-05 (×2): 1 via ORAL
  Filled 2021-09-04 (×2): qty 1

## 2021-09-04 MED ORDER — DEXAMETHASONE SODIUM PHOSPHATE 10 MG/ML IJ SOLN
INTRAMUSCULAR | Status: AC
  Filled 2021-09-04: qty 1

## 2021-09-04 MED ORDER — ESMOLOL HCL 100 MG/10ML IV SOLN
INTRAVENOUS | Status: AC
  Filled 2021-09-04: qty 10

## 2021-09-04 MED ORDER — GABAPENTIN 300 MG PO CAPS
1200.0000 mg | ORAL_CAPSULE | Freq: Every day | ORAL | Status: DC
  Administered 2021-09-04: 1200 mg via ORAL
  Filled 2021-09-04: qty 4

## 2021-09-04 MED ORDER — CEFAZOLIN SODIUM-DEXTROSE 2-4 GM/100ML-% IV SOLN
2.0000 g | Freq: Three times a day (TID) | INTRAVENOUS | Status: AC
  Administered 2021-09-04 (×2): 2 g via INTRAVENOUS
  Filled 2021-09-04 (×2): qty 100

## 2021-09-04 MED ORDER — ASCORBIC ACID 500 MG PO TABS
500.0000 mg | ORAL_TABLET | Freq: Every day | ORAL | Status: DC
  Administered 2021-09-04: 500 mg via ORAL
  Filled 2021-09-04: qty 1

## 2021-09-04 MED ORDER — 0.9 % SODIUM CHLORIDE (POUR BTL) OPTIME
TOPICAL | Status: DC | PRN
  Administered 2021-09-04: 1000 mL

## 2021-09-04 MED ORDER — CO-ENZYME Q10 100 MG PO CAPS: 100.0000 mg | ORAL_CAPSULE | Freq: Every day | ORAL | Status: DC

## 2021-09-04 MED ORDER — ORAL CARE MOUTH RINSE: 15.0000 mL | Freq: Once | OROMUCOSAL | Status: AC

## 2021-09-04 MED ORDER — PHENYLEPHRINE 40 MCG/ML (10ML) SYRINGE FOR IV PUSH (FOR BLOOD PRESSURE SUPPORT)
PREFILLED_SYRINGE | INTRAVENOUS | Status: AC
  Filled 2021-09-04: qty 10

## 2021-09-04 MED ORDER — HYDROCHLOROTHIAZIDE 25 MG PO TABS: 12.5000 mg | ORAL_TABLET | Freq: Every day | ORAL | Status: DC

## 2021-09-04 MED ORDER — ALUM & MAG HYDROXIDE-SIMETH 200-200-20 MG/5ML PO SUSP: 30.0000 mL | Freq: Four times a day (QID) | ORAL | Status: DC | PRN

## 2021-09-04 MED ORDER — HYDROMORPHONE HCL 1 MG/ML IJ SOLN: 0.2500 mg | INTRAMUSCULAR | Status: DC | PRN

## 2021-09-04 MED ORDER — ACETAMINOPHEN 325 MG PO TABS: 650.0000 mg | ORAL_TABLET | ORAL | Status: DC | PRN

## 2021-09-04 MED ORDER — PRESERVISION AREDS 2 PO CAPS: 1.0000 | ORAL_CAPSULE | Freq: Two times a day (BID) | ORAL | Status: DC

## 2021-09-04 MED ORDER — HYDROMORPHONE HCL 1 MG/ML IJ SOLN: 0.5000 mg | INTRAMUSCULAR | Status: DC | PRN

## 2021-09-04 MED ORDER — APREPITANT 40 MG PO CAPS
ORAL_CAPSULE | ORAL | Status: AC
  Administered 2021-09-04: 40 mg via ORAL
  Filled 2021-09-04: qty 1

## 2021-09-04 MED ORDER — MAGNESIUM OXIDE -MG SUPPLEMENT 400 (240 MG) MG PO TABS
200.0000 mg | ORAL_TABLET | Freq: Every day | ORAL | Status: DC
  Administered 2021-09-04: 200 mg via ORAL
  Filled 2021-09-04: qty 1

## 2021-09-04 MED ORDER — SODIUM CHLORIDE 0.9% FLUSH: 3.0000 mL | INTRAVENOUS | Status: DC | PRN

## 2021-09-04 MED ORDER — CHLORHEXIDINE GLUCONATE 0.12 % MT SOLN
15.0000 mL | Freq: Once | OROMUCOSAL | Status: AC
  Administered 2021-09-04: 15 mL via OROMUCOSAL
  Filled 2021-09-04: qty 15

## 2021-09-04 MED ORDER — VITAMIN B-6 100 MG PO TABS
100.0000 mg | ORAL_TABLET | Freq: Every day | ORAL | Status: DC
  Filled 2021-09-04 (×2): qty 1

## 2021-09-04 MED ORDER — EPHEDRINE 5 MG/ML INJ
INTRAVENOUS | Status: AC
  Filled 2021-09-04: qty 5

## 2021-09-04 MED ORDER — ONDANSETRON HCL 4 MG/2ML IJ SOLN: 4.0000 mg | Freq: Four times a day (QID) | INTRAMUSCULAR | Status: DC | PRN

## 2021-09-04 MED ORDER — VITAMIN D3 25 MCG (1000 UNIT) PO TABS
1000.0000 [IU] | ORAL_TABLET | Freq: Every day | ORAL | Status: DC
  Administered 2021-09-04: 1000 [IU] via ORAL
  Filled 2021-09-04 (×3): qty 1

## 2021-09-04 MED ORDER — METHOCARBAMOL 1000 MG/10ML IJ SOLN
500.0000 mg | Freq: Four times a day (QID) | INTRAVENOUS | Status: DC | PRN
  Filled 2021-09-04: qty 5

## 2021-09-04 MED ORDER — SODIUM CHLORIDE 0.9% FLUSH
3.0000 mL | Freq: Two times a day (BID) | INTRAVENOUS | Status: DC
  Administered 2021-09-04: 3 mL via INTRAVENOUS

## 2021-09-04 MED ORDER — CEFAZOLIN SODIUM-DEXTROSE 2-3 GM-%(50ML) IV SOLR
INTRAVENOUS | Status: DC | PRN
  Administered 2021-09-04: 2 g via INTRAVENOUS

## 2021-09-04 MED ORDER — APREPITANT 40 MG PO CAPS: 40.0000 mg | ORAL_CAPSULE | Freq: Once | ORAL | Status: AC

## 2021-09-04 MED ORDER — HYDROCODONE-ACETAMINOPHEN 5-325 MG PO TABS: 1.0000 | ORAL_TABLET | ORAL | Status: DC | PRN

## 2021-09-04 MED ORDER — PROPOFOL 500 MG/50ML IV EMUL
INTRAVENOUS | Status: DC | PRN
Start: 2021-09-04 — End: 2021-09-04
  Administered 2021-09-04: 25 ug/kg/min via INTRAVENOUS

## 2021-09-04 MED ORDER — METHENAMINE MANDELATE 1 G PO TABS
1000.0000 mg | ORAL_TABLET | Freq: Two times a day (BID) | ORAL | Status: DC
  Administered 2021-09-04 – 2021-09-05 (×2): 1000 mg via ORAL
  Filled 2021-09-04 (×3): qty 1

## 2021-09-04 MED ORDER — ONDANSETRON HCL 4 MG/2ML IJ SOLN
INTRAMUSCULAR | Status: DC | PRN
  Administered 2021-09-04: 4 mg via INTRAVENOUS

## 2021-09-04 MED ORDER — ACETAMINOPHEN 10 MG/ML IV SOLN
INTRAVENOUS | Status: AC
  Filled 2021-09-04: qty 100

## 2021-09-04 MED ORDER — AMISULPRIDE (ANTIEMETIC) 5 MG/2ML IV SOLN
5.0000 mg | Freq: Once | INTRAVENOUS | Status: AC
  Administered 2021-09-04: 5 mg via INTRAVENOUS

## 2021-09-04 MED ORDER — CALCIUM + D3 600-800 MG-UNIT PO TABS: ORAL_TABLET | Freq: Every day | ORAL | Status: DC

## 2021-09-04 MED ORDER — ACETAMINOPHEN 500 MG PO TABS: 1000.0000 mg | ORAL_TABLET | Freq: Four times a day (QID) | ORAL | Status: DC | PRN

## 2021-09-04 MED ORDER — THROMBIN 20000 UNITS EX SOLR
CUTANEOUS | Status: AC
  Filled 2021-09-04: qty 20000

## 2021-09-04 MED ORDER — LIDOCAINE 2% (20 MG/ML) 5 ML SYRINGE
INTRAMUSCULAR | Status: DC | PRN
Start: 2021-09-04 — End: 2021-09-04
  Administered 2021-09-04: 6 mg via INTRAVENOUS

## 2021-09-04 MED ORDER — BUPIVACAINE LIPOSOME 1.3 % IJ SUSP
INTRAMUSCULAR | Status: DC | PRN
  Administered 2021-09-04: 20 mL

## 2021-09-04 MED ORDER — ACETAMINOPHEN 10 MG/ML IV SOLN
INTRAVENOUS | Status: DC | PRN
  Administered 2021-09-04: 1000 mg via INTRAVENOUS

## 2021-09-04 MED ORDER — ONDANSETRON HCL 4 MG PO TABS: 4.0000 mg | ORAL_TABLET | Freq: Four times a day (QID) | ORAL | Status: DC | PRN

## 2021-09-04 MED ORDER — ROCURONIUM BROMIDE 10 MG/ML (PF) SYRINGE
PREFILLED_SYRINGE | INTRAVENOUS | Status: AC
  Filled 2021-09-04: qty 10

## 2021-09-04 MED ORDER — DEXAMETHASONE SODIUM PHOSPHATE 10 MG/ML IJ SOLN
INTRAMUSCULAR | Status: DC | PRN
  Administered 2021-09-04: 10 mg via INTRAVENOUS

## 2021-09-04 MED ORDER — ROCURONIUM BROMIDE 10 MG/ML (PF) SYRINGE
PREFILLED_SYRINGE | INTRAVENOUS | Status: DC | PRN
  Administered 2021-09-04: 70 mg via INTRAVENOUS
  Administered 2021-09-04: 20 mg via INTRAVENOUS

## 2021-09-04 MED ORDER — THROMBIN 5000 UNITS EX SOLR
CUTANEOUS | Status: AC
  Filled 2021-09-04: qty 5000

## 2021-09-04 MED ORDER — POLYETHYL GLYC-PROPYL GLYC PF 0.4-0.3 % OP SOLN: 1.0000 [drp] | Freq: Every day | OPHTHALMIC | Status: DC | PRN

## 2021-09-04 MED ORDER — PROPOFOL 10 MG/ML IV BOLUS
INTRAVENOUS | Status: AC
  Filled 2021-09-04: qty 20

## 2021-09-04 MED ORDER — SODIUM CHLORIDE 0.9 % IV SOLN
250.0000 mL | INTRAVENOUS | Status: DC
  Administered 2021-09-04: 250 mL via INTRAVENOUS

## 2021-09-04 MED ORDER — PANTOPRAZOLE SODIUM 40 MG IV SOLR
40.0000 mg | Freq: Every day | INTRAVENOUS | Status: DC
  Administered 2021-09-04: 40 mg via INTRAVENOUS
  Filled 2021-09-04: qty 40

## 2021-09-04 MED ORDER — NEBIVOLOL HCL 5 MG PO TABS
5.0000 mg | ORAL_TABLET | Freq: Every day | ORAL | Status: DC
  Filled 2021-09-04: qty 1

## 2021-09-04 MED ORDER — LEVOTHYROXINE SODIUM 25 MCG PO TABS: 25.0000 ug | ORAL_TABLET | ORAL | Status: DC

## 2021-09-04 MED ORDER — ACETAMINOPHEN 500 MG PO TABS
1000.0000 mg | ORAL_TABLET | Freq: Once | ORAL | Status: DC
  Filled 2021-09-04: qty 2

## 2021-09-04 MED ORDER — LORATADINE 10 MG PO TABS: 10.0000 mg | ORAL_TABLET | Freq: Every day | ORAL | Status: DC | PRN

## 2021-09-04 MED ORDER — POTASSIUM CHLORIDE CRYS ER 10 MEQ PO TBCR
10.0000 meq | EXTENDED_RELEASE_TABLET | Freq: Every day | ORAL | Status: DC
  Administered 2021-09-04: 10 meq via ORAL
  Filled 2021-09-04 (×2): qty 1

## 2021-09-04 MED ORDER — EPHEDRINE SULFATE-NACL 50-0.9 MG/10ML-% IV SOSY
PREFILLED_SYRINGE | INTRAVENOUS | Status: DC | PRN
  Administered 2021-09-04 (×2): 5 mg via INTRAVENOUS

## 2021-09-04 MED ORDER — BUPIVACAINE LIPOSOME 1.3 % IJ SUSP
INTRAMUSCULAR | Status: AC
  Filled 2021-09-04: qty 20

## 2021-09-04 MED ORDER — PROSIGHT PO TABS
1.0000 | ORAL_TABLET | Freq: Every day | ORAL | Status: DC
  Filled 2021-09-04 (×2): qty 1

## 2021-09-04 MED ORDER — LIDOCAINE-EPINEPHRINE 1 %-1:100000 IJ SOLN
INTRAMUSCULAR | Status: AC
  Filled 2021-09-04: qty 1

## 2021-09-04 MED ORDER — SUGAMMADEX SODIUM 200 MG/2ML IV SOLN
INTRAVENOUS | Status: DC | PRN
  Administered 2021-09-04 (×3): 50 mg via INTRAVENOUS

## 2021-09-04 MED ORDER — METHENAMINE HIPPURATE 1 G PO TABS: 1.0000 g | ORAL_TABLET | Freq: Two times a day (BID) | ORAL | Status: DC

## 2021-09-04 MED ORDER — MECLIZINE HCL 12.5 MG PO TABS
12.5000 mg | ORAL_TABLET | Freq: Once | ORAL | Status: AC
  Administered 2021-09-04: 12.5 mg via ORAL
  Filled 2021-09-04: qty 1

## 2021-09-04 MED ORDER — BUPIVACAINE HCL (PF) 0.25 % IJ SOLN
INTRAMUSCULAR | Status: AC
  Filled 2021-09-04: qty 30

## 2021-09-04 MED ORDER — FENTANYL CITRATE (PF) 250 MCG/5ML IJ SOLN
INTRAMUSCULAR | Status: AC
  Filled 2021-09-04: qty 5

## 2021-09-04 SURGICAL SUPPLY — 76 items
BAG COUNTER SPONGE SURGICOUNT (BAG) ×2 IMPLANT
BASKET BONE COLLECTION (BASKET) ×2 IMPLANT
BENZOIN TINCTURE PRP APPL 2/3 (GAUZE/BANDAGES/DRESSINGS) ×2 IMPLANT
BLADE CLIPPER SURG (BLADE) IMPLANT
BLADE SURG 11 STRL SS (BLADE) ×2 IMPLANT
BONE CANC CHIPS 20CC PCAN1/4 (Bone Implant) ×2 IMPLANT
BONE VIVIGEN FORMABLE 5.4CC (Bone Implant) ×2 IMPLANT
BUR CUTTER 7.0 ROUND (BURR) ×2 IMPLANT
BUR MATCHSTICK NEURO 3.0 LAGG (BURR) ×2 IMPLANT
CANISTER SUCT 3000ML PPV (MISCELLANEOUS) ×2 IMPLANT
CAP LCK SPNE (Orthopedic Implant) ×7 IMPLANT
CAP LOCK SPINE RADIUS (Orthopedic Implant) ×7 IMPLANT
CAP LOCKING (Orthopedic Implant) ×14 IMPLANT
CARTRIDGE OIL MAESTRO DRILL (MISCELLANEOUS) ×1 IMPLANT
CHIPS CANC BONE 20CC PCAN1/4 (Bone Implant) ×1 IMPLANT
CLSR STERI-STRIP ANTIMIC 1/2X4 (GAUZE/BANDAGES/DRESSINGS) ×2 IMPLANT
CNTNR URN SCR LID CUP LEK RST (MISCELLANEOUS) ×1 IMPLANT
CONT SPEC 4OZ STRL OR WHT (MISCELLANEOUS) ×2
COVER BACK TABLE 60X90IN (DRAPES) ×2 IMPLANT
DECANTER SPIKE VIAL GLASS SM (MISCELLANEOUS) ×2 IMPLANT
DERMABOND ADVANCED (GAUZE/BANDAGES/DRESSINGS) ×1
DERMABOND ADVANCED .7 DNX12 (GAUZE/BANDAGES/DRESSINGS) ×1 IMPLANT
DIFFUSER DRILL AIR PNEUMATIC (MISCELLANEOUS) ×2 IMPLANT
DRAPE C-ARM 42X72 X-RAY (DRAPES) ×2 IMPLANT
DRAPE C-ARMOR (DRAPES) IMPLANT
DRAPE HALF SHEET 40X57 (DRAPES) IMPLANT
DRAPE LAPAROTOMY 100X72X124 (DRAPES) ×2 IMPLANT
DRAPE SURG 17X23 STRL (DRAPES) ×2 IMPLANT
DRSG OPSITE 4X5.5 SM (GAUZE/BANDAGES/DRESSINGS) ×2 IMPLANT
DRSG OPSITE POSTOP 4X6 (GAUZE/BANDAGES/DRESSINGS) ×2 IMPLANT
DRSG OPSITE POSTOP 4X8 (GAUZE/BANDAGES/DRESSINGS) ×2 IMPLANT
DURAPREP 26ML APPLICATOR (WOUND CARE) ×2 IMPLANT
ELECT REM PT RETURN 9FT ADLT (ELECTROSURGICAL) ×2
ELECTRODE REM PT RTRN 9FT ADLT (ELECTROSURGICAL) ×1 IMPLANT
EVACUATOR 1/8 PVC DRAIN (DRAIN) ×2 IMPLANT
EVACUATOR 3/16  PVC DRAIN (DRAIN) ×2
EVACUATOR 3/16 PVC DRAIN (DRAIN) ×1 IMPLANT
GAUZE 4X4 16PLY ~~LOC~~+RFID DBL (SPONGE) IMPLANT
GAUZE SPONGE 4X4 12PLY STRL (GAUZE/BANDAGES/DRESSINGS) ×2 IMPLANT
GLOVE EXAM NITRILE XL STR (GLOVE) IMPLANT
GLOVE SURG ENC MOIS LTX SZ7 (GLOVE) IMPLANT
GLOVE SURG ENC MOIS LTX SZ8 (GLOVE) ×4 IMPLANT
GLOVE SURG UNDER LTX SZ8.5 (GLOVE) ×4 IMPLANT
GLOVE SURG UNDER POLY LF SZ7 (GLOVE) IMPLANT
GOWN STRL REUS W/ TWL LRG LVL3 (GOWN DISPOSABLE) IMPLANT
GOWN STRL REUS W/ TWL XL LVL3 (GOWN DISPOSABLE) ×2 IMPLANT
GOWN STRL REUS W/TWL 2XL LVL3 (GOWN DISPOSABLE) IMPLANT
GOWN STRL REUS W/TWL LRG LVL3 (GOWN DISPOSABLE)
GOWN STRL REUS W/TWL XL LVL3 (GOWN DISPOSABLE) ×4
KIT BASIN OR (CUSTOM PROCEDURE TRAY) ×2 IMPLANT
KIT INFUSE SMALL (Orthopedic Implant) ×2 IMPLANT
KIT POSITION SURG JACKSON T1 (MISCELLANEOUS) ×2 IMPLANT
KIT TURNOVER KIT B (KITS) ×2 IMPLANT
MILL MEDIUM DISP (BLADE) ×2 IMPLANT
NEEDLE HYPO 21X1.5 SAFETY (NEEDLE) ×2 IMPLANT
NEEDLE HYPO 25X1 1.5 SAFETY (NEEDLE) ×2 IMPLANT
NS IRRIG 1000ML POUR BTL (IV SOLUTION) ×4 IMPLANT
OIL CARTRIDGE MAESTRO DRILL (MISCELLANEOUS) ×2
PACK LAMINECTOMY NEURO (CUSTOM PROCEDURE TRAY) ×2 IMPLANT
PAD ARMBOARD 7.5X6 YLW CONV (MISCELLANEOUS) ×6 IMPLANT
RASP 3.0MM (RASP) ×2 IMPLANT
ROD 90MM RADIUS (Rod) ×4 IMPLANT
SCREW 5.75X45MM (Screw) ×8 IMPLANT
SPACER SUSTAIN TI 8X26X11 8D (Spacer) ×4 IMPLANT
SPONGE SURGIFOAM ABS GEL 100 (HEMOSTASIS) ×2 IMPLANT
SPONGE T-LAP 4X18 ~~LOC~~+RFID (SPONGE) IMPLANT
STRIP CLOSURE SKIN 1/2X4 (GAUZE/BANDAGES/DRESSINGS) ×4 IMPLANT
SUT VIC AB 0 CT1 18XCR BRD8 (SUTURE) ×1 IMPLANT
SUT VIC AB 0 CT1 8-18 (SUTURE) ×2
SUT VIC AB 2-0 CT1 18 (SUTURE) ×2 IMPLANT
SUT VIC AB 4-0 PS2 27 (SUTURE) ×2 IMPLANT
SYR 20ML LL LF (SYRINGE) ×2 IMPLANT
TOWEL GREEN STERILE (TOWEL DISPOSABLE) ×2 IMPLANT
TOWEL GREEN STERILE FF (TOWEL DISPOSABLE) ×2 IMPLANT
TRAY FOLEY MTR SLVR 16FR STAT (SET/KITS/TRAYS/PACK) ×2 IMPLANT
WATER STERILE IRR 1000ML POUR (IV SOLUTION) ×2 IMPLANT

## 2021-09-04 NOTE — Op Note (Signed)
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  2.  Posterior lumbar interbody fusion L2-3 utilizing the globus insert and rotate titanium cages packed with locally harvested autograft mixed with vivigen and BMP  3.  Pedicle screw fixation L1-L3 on the left and L1-L4 on the right utilizing the Stryker radius pedicle screw system  4.  Posterior lateral arthrodesis L1-2 and L2-3 bilaterally  5.  Exploration fusion removal hardware with cutting the rod at L3-4 and the left and L4-5 on the right  Surgeon: Dominica Severin Areliz Rothman  Assistant: Ashok Pall  And Assistant #2 Nash Shearer  Anesthesia: General  EBL: Minimal  HPI: 80 year old female previously undergone L3-S1 fusion over successive operations presents now with progressive worsening back pain bilateral hip and leg pain consistent with an L3 radiculopathy and neurogenic claudication.  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  Due to patient progression of clinical syndrome imaging findings and failed conservative treatment I recommended decompression interbody fusion at L2-3 pedicle screw and instrumented posterior lateral arthrodesis L1-L2 with potential tiling tying into her old construct versus cutting the rods depending on what space we had.  I extensively reviewed the risks and benefits of the operation with her as well as perioperative course expectations of outcome and alternatives of surgery and she  understood and agreed to proceed forward.  Operative procedure: Patient was brought into the OR was induced under general anesthesia positioned prone the Wilson frame her back was prepped and draped in routine sterile fashion her old incision was opened up and extended slightly cephalad.  Subcutaneous tissue was dissected away with Bovie electrocautery and subperiosteal dissection was carried lamina of T12 L1-L2 and expose the previous fusion construct from L3 down to L5 bilaterally.  I asked dissected out and exposed the TPs at L1 and L2 as well as L3.  Then I remove the spinous process of L2 performed a central decompressive laminectomy there was still some residual spinal laminar complex at L3 this was all teased off the dura scar tissue was freed up and removed complete medial facetectomies were performed bilaterally was hourglass compression of thecal sac severe in nature at L2-3 this was all removed decompressing central canal radical foraminotomies were performed of the L2 and L3 nerve roots up aggressive under biting of the supra reticulating facet gained access to the lateral margin of the disc base.  Epidural veins were coagulated the space was entered utilizing sequential distraction the spondee and was partially reduced and with an 11 distractor in place I selected an 8 x 11 8degree insert and rotate titanium cage packed with locally harvested autograft mixed with vivigen and inserted on the patient's right side there after the adequate discectomy and endplate preparation been achieved throughout the disc base I packed an extensive mount of the autograft mix centrally and inserted the contralateral cage under fluoroscopy.  After all the cages confirmed be in good position pedicle screws were placed at L1 and L2 bilaterally all screws had excellent purchase fluoroscopy confirmed good position of all the implants.  Then wound was copiously irrigated aggressive  decortication was carried out along the TPs  facet joints residual pars at L1-L2 down from L1-2 and L2-3 bilaterally.  The extensive remainder of the autograft mix supplemented with some cancellous chips were packed from L1-2 and L2-3.  All the foramina were clear inspected to confirm patency no migration of graft material Gelfoam was ON top of the dura rods were fashioned and anchored everything in place the L2 screw was compressed against L3 then after everything was anchored in place I placed a medium Hemovac drain injected Exparel in the fascia and the wound was closed in layers with interrupted Vicryl and a running 4 subcuticular.  Dermabond benzoin Steri-Strips and a sterile dressing was applied patient recovery in stable condition.  At the end the case all needle counts and sponge counts were correct.

## 2021-09-04 NOTE — Progress Notes (Signed)
Orthopedic Tech Progress Note Patient Details:  Audrey [name] 08-10-1941 [id number]  Ortho Devices Type of Ortho Device: Lumbar corsett Ortho Device/Splint Interventions: Ordered   Post Interventions Instructions Provided: Adjustment of device, Care of device  [name] 09/04/2021, 1:46 PM

## 2021-09-04 NOTE — Anesthesia Postprocedure Evaluation (Signed)
Anesthesia Post Note  Patient: Audrey [name]  Procedure(s) Performed: Posterior Lumbar Interbody Fusion - Lumbar one-Lumbar two - Lumbar two-Lumbar three (Back)     Patient location during evaluation: PACU Anesthesia Type: General Level of consciousness: awake and alert Pain management: pain level controlled Vital Signs Assessment: post-procedure vital signs reviewed and stable Respiratory status: spontaneous breathing, nonlabored ventilation and respiratory function stable Cardiovascular status: blood pressure returned to baseline and stable Postop Assessment: no apparent nausea or vomiting Anesthetic complications: no   No notable events documented.  Last Vitals:  Vitals:   09/04/21 1140 09/04/21 1251  BP: 127/70 135/71  Pulse: 74 70  Resp: 16 18  Temp:    SpO2: 97% 98%    Last Pain:  Vitals:   09/04/21 1110  PainSc: 0-No pain                 [name],W. EDMOND

## 2021-09-04 NOTE — Transfer of Care (Signed)
Immediate Anesthesia Transfer of Care Note  Patient: Audrey [name]  Procedure(s) Performed: Posterior Lumbar Interbody Fusion - Lumbar one-Lumbar two - Lumbar two-Lumbar three (Back)  Patient Location: PACU  Anesthesia Type:General  Level of Consciousness: awake and patient cooperative  Airway & Oxygen Therapy: Patient Spontanous Breathing and Patient connected to face mask oxygen  Post-op Assessment: Report given to RN and Post -op Vital signs reviewed and stable  Post vital signs: Reviewed and stable  Last Vitals:  Vitals Value Taken Time  BP 124/81 09/04/21 1110  Temp 36.4 C 09/04/21 1110  Pulse 81 09/04/21 1113  Resp 15 09/04/21 1113  SpO2 97 % 09/04/21 1113  Vitals shown include unvalidated device data.  Last Pain:  Vitals:   09/04/21 0619  PainSc: 7          Complications: No notable events documented.

## 2021-09-04 NOTE — H&P (Signed)
Audrey [name] is an 80 y.o. female.   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  Due to patient's progression of clinical syndrome imaging findings and failed conservative treatment I recommended reexploration fusion and decompression stabilization procedure at L2-3 and stabilization at L1-L2 tying into her old construct below L3.  I have extensively gone over the risks and benefits of the operation with her as well as perioperative course expectations of outcome and alternatives of surgery and she understands and agrees to proceed forward.  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   Hypertension    Hypothyroidism    Obesity    PONV (postoperative nausea and vomiting)    Reflux    buring and dysphagia sx resp to PPI; egd 3/06 neg for [place]ts   Tachycardia     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   Yeast infection    hx of     Past Surgical History:  Procedure Laterality Date   AXILLARY LYMPH NODE BIOPSY Left 12/27/2015   Procedure: LEFT AXILLARY SENTINEL LYMPH NODE BIOPSY;  Surgeon: [name], MD;  Location: [facility];  Service: General;  Laterality: Left;   BELPHAROPTOSIS REPAIR  Bilateral    BREAST LUMPECTOMY WITH RADIOACTIVE SEED LOCALIZATION Left 12/01/2015   Procedure: LEFT BREAST [place] RADIOACTIVE SEED LOCALIZATION;  Surgeon: [name], MD;  Location: [facility];  Service: General;  Laterality: Left;   CATARACT EXTRACTION, BILATERAL Bilateral 08/2018   COLONOSCOPY  3/06   NEG   cortisone injections     right arm; back   EYE SURGERY     cosmetic; eyelid surgery bilat    FINGER SURGERY Left    left index finger/ cyst 02/2007   KNEE ARTHROSCOPY Right 04/13/2015   Procedure: ARTHROSCOPY RIGHT KNEE WITH MEDIAL MENISCAL DEBRIDEMENT AND CHONDROPLASTY;  Surgeon: Gaynelle Arabian, MD;  Location: [facility];  Service: Orthopedics;  Laterality: Right;   KYPHOPLASTY N/A 03/16/2020   Procedure: KYPHOPLASTY THORACIC THREE, THORACIC FOUR, THORACIC SIX;  Surgeon: Jovita Gamma, MD;  Location: [facility];  Service: Neurosurgery;  Laterality: N/A;   left rotator cuff repair Left    12/2006   LUMBAR LAMINECTOMY     TONSILLECTOMY      Family History  Problem Relation Age of Onset   Diabetes Mother    Breast cancer Mother        dx. late 7s-early 59s; "metastasis to throat"?   Hodgkin's lymphoma Father 36   Breast cancer Sister        maternal half-sister dx. w/ DCIS in her late 6s;   Other Sister  learning disabilities; lives on her own   Diabetes Sister    Breast cancer Maternal Aunt        dx. 33s   Ovarian cancer Maternal Aunt 59   Breast cancer Paternal Aunt        dx. early 77s   Stroke Maternal Grandfather    Depression Paternal Grandmother    Heart Problems Paternal Grandmother    Alcohol abuse Brother    Skin cancer Daughter        dx. early 35s   Other Daughter        insterstitial cystitis   Heart Problems Paternal Aunt    Social History:  reports that she has never smoked. She has never used smokeless tobacco. She reports current drug use. She reports that she does not drink alcohol.  Allergies:  Allergies  Allergen Reactions   Contrast Media  [Iodinated Diagnostic Agents] Shortness Of Breath   Other Swelling    Cat gut sutures Cats and dogs                Derma bond- intolerance- itching- tolerates with Benadryl       Cymbalta [Duloxetine Hcl]     Diarrhea   Macrodantin [Nitrofurantoin] Rash    Medications Prior to Admission  Medication Sig Dispense Refill   acetaminophen (TYLENOL) 500 MG tablet Take 1,000 mg by mouth every 6 (six) hours as needed for mild pain.     Calcium Carb-Cholecalciferol (CALCIUM + D3 PO) Take 1 each by mouth daily.     cholecalciferol (VITAMIN D) 25 MCG (1000 UNIT) tablet Take 1,000 Units by mouth daily.      Co-Enzyme Q10 100 MG CAPS Take 100 mg by mouth daily.      CRANBERRY PO Take 2 tablets by mouth in the morning and at bedtime.     Cyanocobalamin (VITAMIN B-12) 5000 MCG SUBL Place 5,000 mcg under the tongue daily.      gabapentin (NEURONTIN) 300 MG capsule Take 1 tablet in the morning and 4 tablets at bedtime (Patient taking differently: Take 300-1,200 mg by mouth See admin instructions. Take 1 tablet in AM, 1 tablet at noon, and 3 tablets at bedtime) 450 capsule 1   Glucos-Chondroit-Hyaluron-MSM (GLUCOSAMINE CHONDROITIN JOINT PO) Take 1 tablet by mouth 2 (two) times daily.      hydrochlorothiazide (HYDRODIURIL) 12.5 MG tablet Take 12.5 mg by mouth daily.     Lactobacillus-Inulin (CULTURELLE DIGESTIVE DAILY PO) Take 1 tablet by mouth daily.     levothyroxine (SYNTHROID, LEVOTHROID) 25 MCG tablet Take 25 mcg by mouth 4 (four) times a week. Mon, Wed, Fri, and Sat only.     loratadine (CLARITIN) 10 MG tablet Take 10 mg by mouth daily as needed for allergies.      Magnesium 200 MG CHEW Chew 200 mg by mouth daily.     methenamine (HIPREX) 1 g tablet Take 1 g by mouth 2 (two) times daily with a meal.     Multiple Vitamins-Minerals (PRESERVISION AREDS 2) CAPS Take 1 tablet by mouth 2 (two) times daily.      naproxen sodium (ALEVE) 220 MG tablet Take 220 mg by mouth daily as needed (pain).     nebivolol  (BYSTOLIC) 5 MG tablet Take 5 mg by mouth daily.     Omega-3 Fatty Acids (OMEGA 3 PO) Take 1 capsule by mouth daily.     oxyCODONE-acetaminophen (PERCOCET/ROXICET) 5-325 MG tablet Take 1 tablet by mouth every 4 (four) hours as needed for severe pain.  Polyethyl Glyc-Propyl Glyc PF (SYSTANE ULTRA PF) 0.4-0.3 % SOLN Place 1 drop into both eyes daily as needed (dry eyes).     potassium chloride (KLOR-CON) 10 MEQ tablet Take 1 tablet (10 mEq total) by mouth daily. Take 2 tabs for one week then change to once daily (Patient taking differently: Take 10 mEq by mouth 2 (two) times daily.) 60 tablet 2   pyridOXINE (VITAMIN B-6) 100 MG tablet Take 100 mg by mouth daily.     vitamin C (ASCORBIC ACID) 500 MG tablet Take 500 mg by mouth daily.      Results for orders placed or performed during the hospital encounter of 09/04/21 (from the past 48 hour(s))  I-STAT, chem 8     Status: Abnormal   Collection Time: 09/04/21  6:42 AM  Result Value Ref Range   Sodium 140 135 - 145 mmol/L   Potassium 3.8 3.5 - 5.1 mmol/L   Chloride 101 98 - 111 mmol/L   BUN 11 8 - 23 mg/dL   Creatinine, Ser 0.90 0.44 - 1.00 mg/dL   Glucose, Bld 84 70 - 99 mg/dL    Comment: Glucose reference range applies only to samples taken after fasting for at least 8 hours.   Calcium, Ion 1.03 (L) 1.15 - 1.40 mmol/L   TCO2 28 22 - 32 mmol/L   Hemoglobin 13.9 12.0 - 15.0 g/dL   HCT 41.0 36.0 - 46.0 %   No results found.  Review of Systems  Musculoskeletal:  Positive for back pain.  Neurological:  Positive for weakness and numbness.   Blood pressure (!) 159/85, pulse 71, temperature 98.6 F (37 C), resp. rate 18, height 4\' 11"  (1.499 m), weight 74.4 kg, SpO2 97 %. Physical Exam HENT:     Head: Normocephalic.     Nose: Nose normal.     Mouth/Throat:     Mouth: Mucous membranes are moist.  Eyes:     Pupils: Pupils are equal, round, and reactive to light.  Cardiovascular:     Rate and Rhythm: Normal rate.  Pulmonary:      Effort: Pulmonary effort is normal.  Abdominal:     General: Abdomen is flat.  Musculoskeletal:        General: Normal range of motion.  Skin:    General: Skin is warm.  Neurological:     General: No focal deficit present.     Mental Status: She is alert.     Assessment/Plan 80 year old presents for decompression interbody fusion L2-3 and instrumented posterior lateral arthrodesis L1-L2.  Elaina Hoops, MD 09/04/2021, 7:17 AM

## 2021-09-04 NOTE — Anesthesia Procedure Notes (Signed)
Procedure Name: Intubation Date/Time: 09/04/2021 7:43 AM Performed by: Renato Shin, CRNA Pre-anesthesia Checklist: Patient identified, Emergency Drugs available, Suction available and Patient being monitored Patient Re-evaluated:Patient Re-evaluated prior to induction Oxygen Delivery Method: Circle system utilized Preoxygenation: Pre-oxygenation with 100% oxygen Induction Type: IV induction Ventilation: Mask ventilation without difficulty and Oral airway inserted - appropriate to patient size Laryngoscope Size: Sabra Heck and 2 Grade View: Grade I Tube type: Oral Tube size: 7.0 mm Number of attempts: 1 Airway Equipment and Method: Stylet Placement Confirmation: ETT inserted through vocal cords under direct vision, positive ETCO2 and breath sounds checked- equal and bilateral Secured at: 21 cm Tube secured with: Tape Dental Injury: Teeth and Oropharynx as per pre-operative assessment

## 2021-09-05 NOTE — Discharge Summary (Signed)
Physician Discharge Summary  Patient ID: Audrey [name] MRN: [id number] DOB/AGE: Jun 10, 1941 80 y.o. Estimated body mass index is 33.12 kg/m as calculated from the following:   Height as of this encounter: 4\' 11"  (1.499 m).   Weight as of this encounter: 74.4 kg.   Admit date: 09/04/2021 Discharge date: 09/05/2021  Admission Diagnoses: Instability L2-3 lumbar spinal stenosis L2-3 degenerative disc disease L1-L2  Discharge Diagnoses: Same Active Problems:   Spinal stenosis of lumbar region   Discharged Condition: good  Hospital Course: Patient was admitted to hospital underwent decompression stabilization procedure at L2-3 as well as stabilization L1-L2 postop patient did very well with recovery in the floor on the floor was ambulating and voiding spontaneously tolerating regular diet and stable for discharge home.  Consults: Significant Diagnostic Studies: Treatments: L1-L3 instrumented fusion with decompressive interbody fusion at L2-3 Discharge Exam: Blood pressure (!) 110/57, pulse 74, temperature 98.1 F (36.7 C), temperature source Oral, resp. rate 16, height 4\' 11"  (1.499 m), weight 74.4 kg, SpO2 99 %. Strength 5 out of 5   Disposition: Home   Allergies as of 09/05/2021       Reactions   Contrast Media [iodinated Diagnostic Agents] Shortness Of Breath   Other Swelling   Cat gut sutures Cats and dogs               Derma bond- intolerance- itching- tolerates with Benadryl       Cymbalta [duloxetine Hcl]    Diarrhea   Macrodantin [nitrofurantoin] Rash        Medication List     TAKE these medications    acetaminophen 500 MG tablet Commonly known as: TYLENOL Take 1,000 mg by mouth every 6 (six) hours as needed for mild pain.   CALCIUM + D3 PO Take 1 each by mouth daily.   cholecalciferol 25 MCG (1000 UNIT) tablet Commonly known as: VITAMIN D Take 1,000 Units by mouth daily.   Co-Enzyme Q10 100 MG Caps Take 100 mg by mouth daily.   CRANBERRY  PO Take 2 tablets by mouth in the morning and at bedtime.   CULTURELLE DIGESTIVE DAILY PO Take 1 tablet by mouth daily.   gabapentin 300 MG capsule Commonly known as: NEURONTIN Take 1 tablet in the morning and 4 tablets at bedtime What changed:  how much to take how to take this when to take this additional instructions   GLUCOSAMINE CHONDROITIN JOINT PO Take 1 tablet by mouth 2 (two) times daily.   hydrochlorothiazide 12.5 MG tablet Commonly known as: HYDRODIURIL Take 12.5 mg by mouth daily.   levothyroxine 25 MCG tablet Commonly known as: SYNTHROID Take 25 mcg by mouth 4 (four) times a week. Mon, Wed, Fri, and Sat only.   loratadine 10 MG tablet Commonly known as: CLARITIN Take 10 mg by mouth daily as needed for allergies.   Magnesium 200 MG Chew Chew 200 mg by mouth daily.   methenamine 1 g tablet Commonly known as: HIPREX Take 1 g by mouth 2 (two) times daily with a meal.   naproxen sodium 220 MG tablet Commonly known as: ALEVE Take 220 mg by mouth daily as needed (pain).   nebivolol 5 MG tablet Commonly known as: BYSTOLIC Take 5 mg by mouth daily.   OMEGA 3 PO Take 1 capsule by mouth daily.   oxyCODONE-acetaminophen 5-325 MG tablet Commonly known as: PERCOCET/ROXICET Take 1 tablet by mouth every 4 (four) hours as needed for severe pain.   potassium chloride 10 MEQ tablet Commonly  known as: KLOR-CON Take 1 tablet (10 mEq total) by mouth daily. Take 2 tabs for one week then change to once daily What changed:  when to take this additional instructions   PreserVision AREDS 2 Caps Take 1 tablet by mouth 2 (two) times daily.   pyridOXINE 100 MG tablet Commonly known as: VITAMIN B-6 Take 100 mg by mouth daily.   Systane Ultra PF 0.4-0.3 % Soln Generic drug: Polyethyl Glyc-Propyl Glyc PF Place 1 drop into both eyes daily as needed (dry eyes).   Vitamin B-12 5000 MCG Subl Place 5,000 mcg under the tongue daily.   vitamin C 500 MG tablet Commonly  known as: ASCORBIC ACID Take 500 mg by mouth daily.         Signed: Elaina Hoops 09/05/2021, 8:15 AM

## 2021-09-05 NOTE — Evaluation (Signed)
Occupational Therapy Evaluation/Discharge Patient Details Name: Audrey [name] MRN: [id number] DOB: August 10, 1941 Today's Date: 09/05/2021   History of Present Illness Pt is a 80 y/o female with progressive back and leg pain. Imaging showed segmental degenerative above previous L3-S1 fusion, disc herniation and spondylosis at L1-L2. Pt elected to undergo fusion and decompression of L2-3 and stabilization of L1-2. PMH: breast cancer, fibromyalgia, GERD, HTN, neuropathy.   Clinical Impression   PTA, pt lives with spouse and reports Independence with ADLs and mobility without use of AD. Pt presents s/p procedure noted above with reports of resolved back pain. Educated on spinal precautions for daily tasks with good carryover noted. Pt able to demo ADLs, brace mgmt and in-room mobility Independently. Pt reports husband can assist with IADLs as needed at home. No further skilled OT services needed at acute level or on DC. OT to sign off.      Recommendations for follow up therapy are one component of a multi-disciplinary discharge planning process, led by the attending physician.  Recommendations may be updated based on patient status, additional functional criteria and insurance authorization.   Follow Up Recommendations  No OT follow up    Equipment Recommendations  None recommended by OT    Recommendations for Other Services       Precautions / Restrictions Precautions Precautions: Fall;Back Precaution Booklet Issued: Yes (comment) Required Braces or Orthoses: Spinal Brace Spinal Brace: Lumbar corset Restrictions Weight Bearing Restrictions: No      Mobility Bed Mobility Overal bed mobility: Independent             General bed mobility comments: log rolling via flat bed    Transfers Overall transfer level: Independent Equipment used: None                  Balance Overall balance assessment: No apparent balance deficits (not formally assessed)                                          ADL either performed or assessed with clinical judgement   ADL Overall ADL's : Independent                                       General ADL Comments: Educated on spinal precautions for ADLs/IADLs with pt able to return demo dressing tasks, brace mgmt and mobility in room independently.     Vision Baseline Vision/History: 1 Wears glasses Ability to See in Adequate Light: 0 Adequate Patient Visual Report: No change from baseline Vision Assessment?: No apparent visual deficits     Perception     Praxis      Pertinent Vitals/Pain Pain Assessment: No/denies pain     Hand Dominance Right   Extremity/Trunk Assessment Upper Extremity Assessment Upper Extremity Assessment: Overall WFL for tasks assessed   Lower Extremity Assessment Lower Extremity Assessment: Defer to PT evaluation   Cervical / Trunk Assessment Cervical / Trunk Assessment: Normal   Communication Communication Communication: No difficulties   Cognition Arousal/Alertness: Awake/alert Behavior During Therapy: WFL for tasks assessed/performed Overall Cognitive Status: Within Functional Limits for tasks assessed  General Comments       Exercises     Shoulder Instructions      Home Living Family/patient expects to be discharged to:: Private residence Living Arrangements: Spouse/significant other Available Help at Discharge: Family;Available 24 hours/day Type of Home: House Home Access: Stairs to enter [employer] of Steps: 1   Home Layout: Multi-level;Able to live on main level with bedroom/bathroom Alternate Level Stairs-Number of Steps: 12-14 winding staircase to sewing room Alternate Level Stairs-Rails: Right;Left Bathroom Shower/Tub: Tub/shower unit;Walk-in shower;Tub only   Bathroom Toilet: Handicapped height Bathroom Accessibility: Yes   Home Equipment: Walker - 2  wheels;Shower seat - built in;Grab bars - tub/shower;Hand held shower head;Other (comment) (adjustable bed)          Prior Functioning/Environment Level of Independence: Independent        Comments: no use of AD. Able to complete ADLs, husband assisting with IADLs due to pain. Enjoys sewing        OT Problem List:        OT Treatment/Interventions:      OT Goals(Current goals can be found in the care plan section) Acute Rehab OT Goals Patient Stated Goal: recover, get back to sewing OT Goal Formulation: All assessment and education complete, DC therapy  OT Frequency:     Barriers to D/C:            Co-evaluation              AM-PAC OT "6 Clicks" Daily Activity     Outcome Measure Help from another person eating meals?: None Help from another person taking care of personal grooming?: None Help from another person toileting, which includes using toliet, bedpan, or urinal?: None Help from another person bathing (including washing, rinsing, drying)?: None Help from another person to put on and taking off regular upper body clothing?: None Help from another person to put on and taking off regular lower body clothing?: None 6 Click Score: 24   End of Session Equipment Utilized During Treatment: Back brace Nurse Communication: Mobility status  Activity Tolerance: Patient tolerated treatment well Patient left: in chair  OT Visit Diagnosis: Other abnormalities of gait and mobility (R26.89)                Time: 2902-1115 OT Time Calculation (min): 27 min Charges:  OT General Charges $OT Visit: 1 Visit OT Evaluation $OT Eval Low Complexity: 1 Low OT Treatments $Self Care/Home Management : 8-22 mins  Malachy Chamber, OTR/L Acute Rehab Services Office: [phone]   Layla Maw 09/05/2021, 8:27 AM

## 2021-09-05 NOTE — Discharge Instructions (Signed)

## 2021-09-05 NOTE — Evaluation (Signed)
Physical Therapy Evaluation and Discharge Patient Details Name: Audrey [name] MRN: [id number] DOB: 13-Nov-1941 Today's Date: 09/05/2021  History of Present Illness  Pt is a 80 y/o female who presents s/p L1-3 PLIF on 09/04/2021. PMH significant for breast cancer, fibromyalgia, HTN, neuropathy.   Clinical Impression  Patient evaluated by Physical Therapy with no further acute PT needs identified. All education has been completed and the patient has no further questions. Pt was able to demonstrate transfers and ambulation with gross modified independence and no AD. Occasional unsteadiness noted however no assist was required throughout OOB mobility. Pt was educated on precautions, brace application/wearing schedule, appropriate activity progression, and car transfer. See below for any follow-up Physical Therapy or equipment needs. PT is signing off. Thank you for this referral.        Recommendations for follow up therapy are one component of a multi-disciplinary discharge planning process, led by the attending physician.  Recommendations may be updated based on patient status, additional functional criteria and insurance authorization.  Follow Up Recommendations No PT follow up;Supervision - Intermittent    Equipment Recommendations  None recommended by PT    Recommendations for Other Services       Precautions / Restrictions Precautions Precautions: Fall;Back Precaution Booklet Issued: Yes (comment) Required Braces or Orthoses: Spinal Brace Spinal Brace: Lumbar corset;Applied in sitting position Restrictions Weight Bearing Restrictions: No      Mobility  Bed Mobility Overal bed mobility: Independent             General bed mobility comments: Pt was received sitting up on EOB. Reviewed log roll technique verbally.    Transfers Overall transfer level: Modified independent Equipment used: None             General transfer comment: Pt demonstrated proper hand  placement on seated surface for safety.  Ambulation/Gait Ambulation/Gait assistance: Modified independent (Device/Increase time) Gait Distance (Feet): 500 Feet Assistive device: None Gait Pattern/deviations: Step-through pattern;Decreased stride length;Trunk flexed Gait velocity: Decreased Gait velocity interpretation: 1.31 - 2.62 ft/sec, indicative of limited community ambulator General Gait Details: Mildly unsteady but without assistance required. Occasional reaching out for railings.  Stairs Stairs: Yes Stairs assistance: Min guard Stair Management: One rail Right;Step to pattern;Forwards Number of Stairs: 10 General stair comments: VC's for sequencing and general safety. Pt was able to complete without difficulty.  Wheelchair Mobility    Modified Rankin (Stroke Patients Only)       Balance Overall balance assessment: Mild deficits observed, not formally tested                                           Pertinent Vitals/Pain Pain Assessment: No/denies pain    Home Living Family/patient expects to be discharged to:: Private residence Living Arrangements: Spouse/significant other Available Help at Discharge: Family;Available 24 hours/day Type of Home: House Home Access: Stairs to enter   [employer] of Steps: 1 Home Layout: Multi-level;Able to live on main level with bedroom/bathroom Home Equipment: Gilford Rile - 2 wheels;Shower seat - built in;Grab bars - tub/shower;Hand held shower head;Other (comment) (adjustable bed)      Prior Function Level of Independence: Independent         Comments: no use of AD. Able to complete ADLs, husband assisting with IADLs due to pain. Enjoys sewing     Hand Dominance   Dominant Hand: Right    Extremity/Trunk Assessment  Upper Extremity Assessment Upper Extremity Assessment: Defer to OT evaluation    Lower Extremity Assessment Lower Extremity Assessment: Generalized weakness (Consistent with  pre-op diagnosis)    Cervical / Trunk Assessment Cervical / Trunk Assessment: Other exceptions Cervical / Trunk Exceptions: s/p surgery  Communication   Communication: No difficulties  Cognition Arousal/Alertness: Awake/alert Behavior During Therapy: WFL for tasks assessed/performed Overall Cognitive Status: Within Functional Limits for tasks assessed                                        General Comments      Exercises     Assessment/Plan    PT Assessment Patent does not need any further PT services  PT Problem List         PT Treatment Interventions      PT Goals (Current goals can be found in the Care Plan section)  Acute Rehab PT Goals Patient Stated Goal: Be able to get up to her sewing room and exercise PT Goal Formulation: All assessment and education complete, DC therapy    Frequency     Barriers to discharge        Co-evaluation               AM-PAC PT "6 Clicks" Mobility  Outcome Measure Help needed turning from your back to your side while in a flat bed without using bedrails?: None Help needed moving from lying on your back to sitting on the side of a flat bed without using bedrails?: None Help needed moving to and from a bed to a chair (including a wheelchair)?: None Help needed standing up from a chair using your arms (e.g., wheelchair or bedside chair)?: None Help needed to walk in hospital room?: None Help needed climbing 3-5 steps with a railing? : A Little 6 Click Score: 23    End of Session Equipment Utilized During Treatment: Gait belt;Back brace Activity Tolerance: Patient tolerated treatment well Patient left: with call bell/phone within reach;Other (comment) (Sitting EOB) Nurse Communication: Mobility status PT Visit Diagnosis: Unsteadiness on feet (R26.81);Pain    Time: 0093-8182 PT Time Calculation (min) (ACUTE ONLY): 26 min   Charges:   PT Evaluation $PT Eval Low Complexity: 1 Low PT Treatments $Gait  Training: 8-22 mins        Rolinda Roan, PT, DPT Acute Rehabilitation Services Pager: [phone] Office: [phone]   Thelma Comp 09/05/2021, 10:21 AM

## 2021-09-05 NOTE — Plan of Care (Signed)
Patient alert and oriented, mae's well, voiding adequate amount of urine, swallowing without difficulty, no c/o pain at time of discharge. Patient discharged home with family. Script and discharged instructions given to patient. Patient and family stated understanding of instructions given. Patient has an appointment with Dr. Cram 

## 2021-09-09 DIAGNOSIS — K5900 Constipation, unspecified: Secondary | ICD-10-CM | POA: Diagnosis not present

## 2021-09-09 DIAGNOSIS — R110 Nausea: Secondary | ICD-10-CM | POA: Diagnosis not present

## 2021-09-09 DIAGNOSIS — I10 Essential (primary) hypertension: Secondary | ICD-10-CM | POA: Diagnosis not present

## 2021-09-10 ENCOUNTER — Emergency Department (HOSPITAL_COMMUNITY): Payer: PPO

## 2021-09-10 ENCOUNTER — Emergency Department (HOSPITAL_COMMUNITY)
Admission: EM | Admit: 2021-09-10 | Discharge: 2021-09-10 | Disposition: A | Discharge status: Home / Self Care | Payer: PPO | Attending: Emergency Medicine | Admitting: Emergency Medicine

## 2021-09-10 ENCOUNTER — Other Ambulatory Visit: Payer: Self-pay

## 2021-09-10 DIAGNOSIS — Z5321 Procedure and treatment not carried out due to patient leaving prior to being seen by health care provider: Secondary | ICD-10-CM | POA: Insufficient documentation

## 2021-09-10 DIAGNOSIS — R110 Nausea: Secondary | ICD-10-CM | POA: Diagnosis not present

## 2021-09-10 DIAGNOSIS — M545 Low back pain, unspecified: Secondary | ICD-10-CM | POA: Insufficient documentation

## 2021-09-10 DIAGNOSIS — K5900 Constipation, unspecified: Secondary | ICD-10-CM | POA: Diagnosis not present

## 2021-09-10 DIAGNOSIS — R300 Dysuria: Secondary | ICD-10-CM | POA: Insufficient documentation

## 2021-09-10 DIAGNOSIS — I10 Essential (primary) hypertension: Secondary | ICD-10-CM | POA: Diagnosis not present

## 2021-09-10 LAB — CBC WITH DIFFERENTIAL/PLATELET
Abs Immature Granulocytes: 0.04 10*3/uL (ref 0.00–0.07)
Basophils Absolute: 0 10*3/uL (ref 0.0–0.1)
Basophils Relative: 0 %
Eosinophils Absolute: 0 10*3/uL (ref 0.0–0.5)
Eosinophils Relative: 0 %
HCT: 29.1 % — ABNORMAL LOW (ref 36.0–46.0)
Hemoglobin: 10.2 g/dL — ABNORMAL LOW (ref 12.0–15.0)
Immature Granulocytes: 0 %
Lymphocytes Relative: 8 %
Lymphs Abs: 0.8 10*3/uL (ref 0.7–4.0)
MCH: 30.8 pg (ref 26.0–34.0)
MCHC: 35.1 g/dL (ref 30.0–36.0)
MCV: 87.9 fL (ref 80.0–100.0)
Monocytes Absolute: 0.5 10*3/uL (ref 0.1–1.0)
Monocytes Relative: 5 %
Neutro Abs: 9.3 10*3/uL — ABNORMAL HIGH (ref 1.7–7.7)
Neutrophils Relative %: 87 %
Platelets: 426 10*3/uL — ABNORMAL HIGH (ref 150–400)
RBC: 3.31 MIL/uL — ABNORMAL LOW (ref 3.87–5.11)
RDW: 13.5 % (ref 11.5–15.5)
WBC: 10.7 10*3/uL — ABNORMAL HIGH (ref 4.0–10.5)
nRBC: 0 % (ref 0.0–0.2)

## 2021-09-10 LAB — URINALYSIS, ROUTINE W REFLEX MICROSCOPIC
Bacteria, UA: NONE SEEN
Bilirubin Urine: NEGATIVE
Glucose, UA: NEGATIVE mg/dL
Ketones, ur: NEGATIVE mg/dL
Leukocytes,Ua: NEGATIVE
Nitrite: NEGATIVE
Protein, ur: NEGATIVE mg/dL
Specific Gravity, Urine: 1.008 (ref 1.005–1.030)
pH: 6 (ref 5.0–8.0)

## 2021-09-10 LAB — BASIC METABOLIC PANEL
Anion gap: 11 (ref 5–15)
BUN: 6 mg/dL — ABNORMAL LOW (ref 8–23)
CO2: 28 mmol/L (ref 22–32)
Calcium: 8.8 mg/dL — ABNORMAL LOW (ref 8.9–10.3)
Chloride: 94 mmol/L — ABNORMAL LOW (ref 98–111)
Creatinine, Ser: 0.74 mg/dL (ref 0.44–1.00)
GFR, Estimated: >60 mL/min (ref >60)
Glucose, Bld: 122 mg/dL — ABNORMAL HIGH (ref 70–99)
Potassium: 3.2 mmol/L — ABNORMAL LOW (ref 3.5–5.1)
Sodium: 133 mmol/L — ABNORMAL LOW (ref 135–145)

## 2021-09-10 NOTE — ED Notes (Addendum)
Pt visitor irritated by wait times, stated the pt and visitor is leaving. This NT helped pt out to car and apologized about wait times in the waiting room.

## 2021-09-10 NOTE — ED Provider Notes (Signed)
Emergency Medicine Provider Triage Evaluation Note  Audrey [name] , a 80 y.o. female  was evaluated in triage.  Pt complains of back pain.  Recent multi-level lumbar surgery with Dr. Saintclair Halsted 09/04/21 and discharged next day.  States initially she felt fine but today worsening back pain with radiation down right leg, some dysuria, and constipation.  She denies fever.  No drainage from incision.  States she has adjusted pain regimen but still no relief.  Review of Systems  Positive: Back pain, dysuria, constipation Negative: Fever, chills  Physical Exam  BP (!) 176/71 (BP Location: Right Arm)   Pulse 80   Temp 98.1 F (36.7 C) (Oral)   Resp (!) 22   SpO2 99%  Gen:   Awake, no distress   Resp:  Normal effort  MSK:   Moves extremities without difficulty  Other:  Lumbar incision is clean without signs of infection, steri-strips remain in place, normal strength of both legs, DP pulses intact bilaterally  Medical Decision Making  Medically screening exam initiated at 4:31 AM.  Appropriate orders placed.  Audrey [name] was informed that the remainder of the evaluation will be completed by another provider, this initial triage assessment does not replace that evaluation, and the importance of remaining in the ED until their evaluation is complete.  Back pain.  6 days post-op from multi-level lumbar surgery with worsening pain radiating down right leg.  Also has dysuria and constipation.  No incontinence reported.  No focal leg weakness in triage.  Suspect constipation may be from opiate medications but will obtain labs, UA, KUB, and MRI lumbar spine.   Larene Pickett, PA-C 09/10/21 [place]    Fatima Blank, MD 09/10/21 [phone]

## 2021-09-10 NOTE — ED Triage Notes (Signed)
Pt had surgery on Monday and went home on Tuesday. Pt said she has been having increased back pain and pressure when she tries to urinate. Pt said burns with urination. Constipation also. Incision looks great, No drainage.

## 2021-09-11 LAB — URINE CULTURE

## 2021-10-10 DIAGNOSIS — M4316 Spondylolisthesis, lumbar region: Secondary | ICD-10-CM | POA: Diagnosis not present

## 2021-10-23 DIAGNOSIS — M4316 Spondylolisthesis, lumbar region: Secondary | ICD-10-CM | POA: Diagnosis not present

## 2021-10-25 ENCOUNTER — Ambulatory Visit: Payer: PPO | Admitting: Adult Health

## 2021-10-25 ENCOUNTER — Encounter: Payer: Self-pay | Admitting: Adult Health

## 2021-10-25 VITALS — BP 150/87 | HR 77 | Ht <= 58 in | Wt 164.2 lb

## 2021-10-25 DIAGNOSIS — G609 Hereditary and idiopathic neuropathy, unspecified: Secondary | ICD-10-CM | POA: Diagnosis not present

## 2021-10-25 MED ORDER — GABAPENTIN 300 MG PO CAPS: ORAL_CAPSULE | ORAL | 3 refills | Status: DC

## 2021-10-25 NOTE — Progress Notes (Signed)
PATIENT: Audrey [name] DOB: 11-18-1941  REASON FOR VISIT: follow up HISTORY FROM: patient  HISTORY OF PRESENT ILLNESS: Today 10/25/21:  Audrey [name] is a 80 year old female with a history of peripheral neuropathy.  She returns today for follow-up.  She is currently taking gabapentin 300 mg in the a.m. and 1200 mg in the p.m.  Occasionally she will take 5 tablets at night or she may take an extra tablet during the day.  She did try the compounded cream but did not find it beneficial.  Denies any significant changes with her gait or balance.  She had her fourth back surgery October 10.  She is currently in physical therapy  04/12/21: Audrey [name] is a 80 year old female with a history of peripheral neuropathy.  She returns today for follow-up.  She reports that she has noticed more discomfort at nighttime.  She states that she does not have pain but describes it as itching.  In the feet.  She states that the cream helps but it takes a while before she notices the benefit.  Reports that she continues to have numbness in the feet.  States that her feet feel cold but are warm to the touch.  She states that she takes 1200 mg of gabapentin about 30 minutes before bedtime she tried to reduce to 900 mg but her discomfort increased.  She continues to take 300 mg in the morning denies any discomfort throughout the day just at bedtime.  Patient also reports that she has been having pain in the upper thighs in the groin region that radiates to the inner part of the thighs.  She states that she also has pain down the lateral aspect of both thighs.  The discomfort comes and goes.  She followed up with Dr. Saintclair Halsted and had an MRI of the back and hips.  I have not seen these images.  She reports that he told her there was no further work-up.  She also saw Dr. Wynelle Link and was told that it was spinal stenosis and advised that she should follow-up with her back surgeon.  I have not reviewed the notes from either doctors.   The patient is asking for my opinion.  10/11/20: Audrey [name] is a 80 year old female with a history of peripheral neuropathy.  She returns today for follow-up.  She states that there are some nights she is unable to sleep due to her neuropathy.  She states that most the time it feels as if her feet are itching.  This has always been how her neuropathy presented.  She does not have any significant pain associated with her neuropathy.  She reports that she adjusted her gabapentin and some nights will take 4 to 5 tablets at bedtime.  She reports that the increase in gabapentin was helpful.  She is currently taking gabapentin 300 mg in the morning and 1200 to 1500 mg at bedtime.  She denies any side effects from gabapentin.  Denies any changes with her gait or balance.  She does report back in March she tripped and fractured her neck and had to have surgery.  Since then she is not had any additional falls.  She returns today for an evaluation.  HISTORY 07/09/19:   Audrey [name] is a 80 year old female with a history of peripheral neuropathy.  She returns today for follow-up.  She continues to take gabapentin 300 mg in the morning and at noon and 600 mg at bedtime.  She states that this continues to  work well for her.  Over the last 6 months she has had 3 episodes where she will have a exacerbation of pain in her feet.  She describes it as a sharp shooting pain that can last for a while.  She states when this does happen she will take an extra gabapentin and it resolves the pain.  She denies any significant changes with her gait or balance.  She states that her balance is not as good as it was because she is not able to go to yoga class to the pandemic.  She denies any new symptoms.  She returns today for an evaluation.  REVIEW OF SYSTEMS: Out of a complete 14 system review of symptoms, the patient complains only of the following symptoms, and all other reviewed systems are negative.  See  HPI  ALLERGIES: Allergies  Allergen Reactions   Contrast Media [Iodinated Diagnostic Agents] Shortness Of Breath   Other Swelling    Cat gut sutures Cats and dogs                Derma bond- intolerance- itching- tolerates with Benadryl       Cymbalta [Duloxetine Hcl]     Diarrhea   Macrodantin [Nitrofurantoin] Rash    HOME MEDICATIONS: Outpatient Medications Prior to Visit  Medication Sig Dispense Refill   Calcium Carb-Cholecalciferol (CALCIUM + D3 PO) Take 1 each by mouth daily.     cholecalciferol (VITAMIN D) 25 MCG (1000 UNIT) tablet Take 1,000 Units by mouth daily.      Co-Enzyme Q10 100 MG CAPS Take 100 mg by mouth daily.      CRANBERRY PO Take 2 tablets by mouth in the morning and at bedtime.     Cyanocobalamin (VITAMIN B-12) 5000 MCG SUBL Place 5,000 mcg under the tongue daily.      gabapentin (NEURONTIN) 300 MG capsule Take 1 tablet in the morning and 4 tablets at bedtime (Patient taking differently: Take 300-1,200 mg by mouth See admin instructions. Take 1 tablet in AM, 1 tablet at noon, and 3 tablets at bedtime) 450 capsule 1   Glucos-Chondroit-Hyaluron-MSM (GLUCOSAMINE CHONDROITIN JOINT PO) Take 1 tablet by mouth 2 (two) times daily.      hydrochlorothiazide (HYDRODIURIL) 12.5 MG tablet Take 12.5 mg by mouth daily.     Lactobacillus-Inulin (CULTURELLE DIGESTIVE DAILY PO) Take 1 tablet by mouth daily.     levothyroxine (SYNTHROID, LEVOTHROID) 25 MCG tablet Take 25 mcg by mouth 4 (four) times a week. Mon, Wed, Fri, and Sat only.     loratadine (CLARITIN) 10 MG tablet Take 10 mg by mouth daily as needed for allergies.      Magnesium 200 MG CHEW Chew 200 mg by mouth daily.     methenamine (HIPREX) 1 g tablet Take 1 g by mouth 2 (two) times daily with a meal.     Multiple Vitamins-Minerals (PRESERVISION AREDS 2) CAPS Take 1 tablet by mouth 2 (two) times daily.      naproxen sodium (ALEVE) 220 MG tablet Take 220 mg by mouth daily as needed (pain).     nebivolol (BYSTOLIC) 5 MG  tablet Take 5 mg by mouth daily.     Omega-3 Fatty Acids (OMEGA 3 PO) Take 1 capsule by mouth daily.     Polyethyl Glyc-Propyl Glyc PF (SYSTANE ULTRA PF) 0.4-0.3 % SOLN Place 1 drop into both eyes daily as needed (dry eyes).     potassium chloride (KLOR-CON) 10 MEQ tablet Take 1 tablet (10 mEq total) by mouth  daily. Take 2 tabs for one week then change to once daily (Patient taking differently: Take 10 mEq by mouth 2 (two) times daily.) 60 tablet 2   pyridOXINE (VITAMIN B-6) 100 MG tablet Take 100 mg by mouth daily.     vitamin C (ASCORBIC ACID) 500 MG tablet Take 500 mg by mouth daily.     acetaminophen (TYLENOL) 500 MG tablet Take 1,000 mg by mouth every 6 (six) hours as needed for mild pain.     oxyCODONE-acetaminophen (PERCOCET/ROXICET) 5-325 MG tablet Take 1 tablet by mouth every 4 (four) hours as needed for severe pain.     No facility-administered medications prior to visit.    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   Hypertension    Hypothyroidism    Obesity    PONV (postoperative nausea and vomiting)    Reflux    buring and dysphagia sx resp to PPI; egd 3/06 neg for [place]ts   Tachycardia     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   Yeast infection    hx of     PAST SURGICAL HISTORY: Past Surgical History:  Procedure Laterality Date   AXILLARY LYMPH NODE BIOPSY Left 12/27/2015   Procedure: LEFT AXILLARY SENTINEL LYMPH NODE BIOPSY;  Surgeon: [name], MD;  Location: [facility];  Service: General;   Laterality: Left;   BELPHAROPTOSIS REPAIR Bilateral    BREAST LUMPECTOMY WITH RADIOACTIVE SEED LOCALIZATION Left 12/01/2015   Procedure: LEFT BREAST [place] RADIOACTIVE SEED LOCALIZATION;  Surgeon: [name], MD;  Location: [facility];  Service: General;  Laterality: Left;   CATARACT EXTRACTION, BILATERAL Bilateral 08/2018   COLONOSCOPY  3/06   NEG   cortisone injections     right arm; back   EYE SURGERY     cosmetic; eyelid surgery bilat    FINGER SURGERY Left    left index finger/ cyst 02/2007   KNEE ARTHROSCOPY Right 04/13/2015   Procedure: ARTHROSCOPY RIGHT KNEE WITH MEDIAL MENISCAL DEBRIDEMENT AND CHONDROPLASTY;  Surgeon: Gaynelle Arabian, MD;  Location: [facility];  Service: Orthopedics;  Laterality: Right;   KYPHOPLASTY N/A 03/16/2020   Procedure: KYPHOPLASTY THORACIC THREE, THORACIC FOUR, THORACIC SIX;  Surgeon: Jovita Gamma, MD;  Location: [facility];  Service: Neurosurgery;  Laterality: N/A;   left rotator cuff repair Left    12/2006   LUMBAR LAMINECTOMY     TONSILLECTOMY      FAMILY HISTORY: Family History  Problem Relation Age of Onset   Diabetes Mother    Breast cancer Mother        dx. late 21s-early 62s; "metastasis to throat"?   Hodgkin's lymphoma Father 67   Breast cancer Sister        maternal half-sister dx. w/ DCIS in her late 35s;   Other Sister        learning disabilities; lives on her own   Diabetes Sister    Alcohol abuse Brother  Breast cancer Maternal Aunt        dx. 22s   Ovarian cancer Maternal Aunt 59   Breast cancer Paternal Aunt        dx. early 25s   Heart Problems Paternal Aunt    Stroke Maternal Grandfather    Depression Paternal Grandmother    Heart Problems Paternal Grandmother    Skin cancer Daughter        dx. early 48s   Other Daughter        insterstitial cystitis   Neuropathy Neg Hx     SOCIAL HISTORY: Social History   Socioeconomic History   Marital status: Married    Spouse name: Not on file   Number of children: 2   Years of  education: 13   Highest education level: Not on file  Occupational History   Occupation: retired  Tobacco Use   Smoking status: Never   Smokeless tobacco: Never  Vaping Use   Vaping Use: Never used  Substance and Sexual Activity   Alcohol use: No   Drug use: Yes    Comment: CBD- last time - 03/15/2020   Sexual activity: Not on file  Other Topics Concern   Not on file  Social History Narrative   Patient is right handed.   Patient drinks 2-3 cups of caffeine per day.   Social Determinants of Health   Financial Resource Strain: Not on file  Food Insecurity: Not on file  Transportation Needs: Not on file  Physical Activity: Not on file  Stress: Not on file  Social Connections: Not on file  Intimate Partner Violence: Not on file      PHYSICAL EXAM  Vitals:   10/25/21 1324  BP: (!) 150/87  Pulse: 77  Weight: 164 lb 3.2 oz (74.5 kg)  Height: 4\' 10"  (1.473 m)   Body mass index is 34.32 kg/m.  Generalized: Well developed, in no acute distress   Neurological examination  Mentation: Alert oriented to time, place, history taking. Follows all commands speech and language fluent Cranial nerve II-XII: Pupils were equal round reactive to light. Extraocular movements were full, visual field were full on confrontational test. Facial sensation and strength were normal. Head turning and shoulder shrug  were normal and symmetric. Motor: The motor testing reveals 5 over 5 strength of all 4 extremities. Good symmetric motor tone is noted throughout.  Sensory: Sensory testing is intact to soft touch on all 4 extremities.  Pinprick sensation decreased in the lower extremities.  Right greater than left Coordination: Cerebellar testing reveals good finger-nose-finger and heel-to-shin bilaterally.  Gait and station: Gait is normal. Tandem gait not attempted Reflexes: Deep tendon reflexes are symmetric and normal bilaterally.   DIAGNOSTIC DATA (LABS, IMAGING, TESTING) - I reviewed patient  records, labs, notes, testing and imaging myself where available.  Lab Results  Component Value Date   WBC 10.7 (H) 09/10/2021   HGB 10.2 (L) 09/10/2021   HCT 29.1 (L) 09/10/2021   MCV 87.9 09/10/2021   PLT 426 (H) 09/10/2021      Component Value Date/Time   NA 133 (L) 09/10/2021 0442   NA 141 11/30/2016 1358   K 3.2 (L) 09/10/2021 0442   K 3.5 11/30/2016 1358   CL 94 (L) 09/10/2021 0442   CO2 28 09/10/2021 0442   CO2 29 11/30/2016 1358   GLUCOSE 122 (H) 09/10/2021 0442   GLUCOSE 103 11/30/2016 1358   BUN 6 (L) 09/10/2021 0442   BUN 14.2 11/30/2016 1358  CREATININE 0.74 09/10/2021 0442   CREATININE 0.8 11/30/2016 1358   CALCIUM 8.8 (L) 09/10/2021 0442   CALCIUM 9.5 11/30/2016 1358   PROT 6.9 01/05/2021 1250   PROT 7.1 11/30/2016 1358   ALBUMIN 3.6 01/05/2021 1250   ALBUMIN 4.0 11/30/2016 1358   AST 22 01/05/2021 1250   AST 25 11/30/2016 1358   ALT 21 01/05/2021 1250   ALT 20 11/30/2016 1358   ALKPHOS 66 01/05/2021 1250   ALKPHOS 71 11/30/2016 1358   BILITOT 0.5 01/05/2021 1250   BILITOT 0.55 11/30/2016 1358   GFRNONAA >60 09/10/2021 0442   GFRAA >60 03/16/2020 0717      ASSESSMENT AND PLAN 80 y.o. year old female  has a past medical history of Allergy, Arthritis, Bladder infection, Breast cancer ([place] [facility]) (10/25/15), Cancer ([place]), Complication of anesthesia, Diastolic dysfunction, Dyspnea, Fibromyalgia, GERD (gastroesophageal reflux disease), Hereditary and idiopathic peripheral neuropathy (02/10/2015), Hypertension, Hypothyroidism, Obesity, PONV (postoperative nausea and vomiting), Reflux, Tachycardia ( ), and Yeast infection. here with:  1.  Peripheral neuropathy  -Advised that she should take gabapentin 300 mg in the morning and 1200 mg at bedtime.  -Advised if symptoms worsen or she develops new symptoms she should let us know -Follow-up in 1 year or sooner if needed   Ward Givens, MSN, NP-C 10/25/2021, 1:47 PM [facility] [address],  [place], [place] [address] [phone]

## 2021-10-31 DIAGNOSIS — N3020 Other chronic cystitis without hematuria: Secondary | ICD-10-CM | POA: Diagnosis not present

## 2021-10-31 DIAGNOSIS — M4316 Spondylolisthesis, lumbar region: Secondary | ICD-10-CM | POA: Diagnosis not present

## 2021-11-02 DIAGNOSIS — M4316 Spondylolisthesis, lumbar region: Secondary | ICD-10-CM | POA: Diagnosis not present

## 2021-11-06 DIAGNOSIS — M4316 Spondylolisthesis, lumbar region: Secondary | ICD-10-CM | POA: Diagnosis not present

## 2021-11-10 DIAGNOSIS — M4316 Spondylolisthesis, lumbar region: Secondary | ICD-10-CM | POA: Diagnosis not present

## 2021-11-16 ENCOUNTER — Other Ambulatory Visit: Payer: Self-pay | Admitting: Neurosurgery

## 2021-11-16 DIAGNOSIS — M5440 Lumbago with sciatica, unspecified side: Secondary | ICD-10-CM

## 2021-11-16 DIAGNOSIS — M4316 Spondylolisthesis, lumbar region: Secondary | ICD-10-CM | POA: Diagnosis not present

## 2021-11-21 DIAGNOSIS — M4316 Spondylolisthesis, lumbar region: Secondary | ICD-10-CM | POA: Diagnosis not present

## 2021-11-24 ENCOUNTER — Ambulatory Visit
Admission: RE | Admit: 2021-11-24 | Discharge: 2021-11-24 | Disposition: A | Discharge status: Home / Self Care | Payer: PPO | Source: Ambulatory Visit | Attending: Neurosurgery | Admitting: Neurosurgery

## 2021-11-24 DIAGNOSIS — M5440 Lumbago with sciatica, unspecified side: Secondary | ICD-10-CM

## 2021-11-24 DIAGNOSIS — M545 Low back pain, unspecified: Secondary | ICD-10-CM | POA: Diagnosis not present

## 2021-11-24 DIAGNOSIS — M4316 Spondylolisthesis, lumbar region: Secondary | ICD-10-CM | POA: Diagnosis not present

## 2021-11-24 DIAGNOSIS — M5126 Other intervertebral disc displacement, lumbar region: Secondary | ICD-10-CM | POA: Diagnosis not present

## 2021-12-12 ENCOUNTER — Other Ambulatory Visit: Payer: Self-pay | Admitting: Neurosurgery

## 2021-12-12 DIAGNOSIS — S32010A Wedge compression fracture of first lumbar vertebra, initial encounter for closed fracture: Secondary | ICD-10-CM | POA: Diagnosis not present

## 2021-12-13 ENCOUNTER — Other Ambulatory Visit: Payer: Self-pay | Admitting: Neurosurgery

## 2021-12-13 DIAGNOSIS — S32010A Wedge compression fracture of first lumbar vertebra, initial encounter for closed fracture: Secondary | ICD-10-CM

## 2021-12-14 NOTE — Pre-Procedure Instructions (Signed)
Surgical Instructions    Your procedure is scheduled on Wednesday, January 25th.  Report to [facility] Main Entrance "A" at 2:00 P.M., then check in with the Admitting office.  Call this number if you have problems the morning of surgery:  [phone]   If you have any questions prior to your surgery date call [phone]: Open Monday-Friday 8am-4pm    Remember:  Do not eat after midnight the night before your surgery  You may drink clear liquids until 1:00 P.M. the afternoon of your surgery.   Clear liquids allowed are: Water, Non-Citrus Juices (without pulp), Carbonated Beverages, Clear Tea, Black Coffee Only (NO MILK, CREAM OR POWDERED CREAMER of any kind), and Gatorade.    Take these medicines the morning of surgery with A SIP OF WATER   gabapentin (NEURONTIN)  famotidine (PEPCID)  levothyroxine (SYNTHROID, LEVOTHROID)  nebivolol (BYSTOLIC)    IF NEEDED acetaminophen (TYLENOL) loratadine (CLARITIN)  Eye drops   As of today, STOP taking any Aspirin (unless otherwise instructed by your surgeon) Aleve, Naproxen, Ibuprofen, Motrin, Advil, Goody's, BC's, all herbal medications, fish oil, and all vitamins. This includes: diclofenac Sodium (VOLTAREN) 1 % GEL.                     Do NOT Smoke (Tobacco/Vaping) for 24 hours prior to your procedure.  If you use a CPAP at night, you may bring all equipment for your overnight stay.   Contacts, glasses, piercing's, hearing aid's, dentures or partials may not be worn into surgery, please bring cases for these belongings.    For patients admitted to the hospital, discharge time will be determined by your treatment team.   Patients discharged the day of surgery will not be allowed to drive home, and someone needs to stay with them for 24 hours.  NO VISITORS WILL BE ALLOWED IN PRE-OP WHERE PATIENTS ARE PREPPED FOR SURGERY.  ONLY 1 SUPPORT PERSON MAY BE PRESENT IN THE WAITING ROOM WHILE YOU ARE IN SURGERY.  IF YOU ARE TO BE ADMITTED, ONCE  YOU ARE IN YOUR ROOM YOU WILL BE ALLOWED TWO (2) VISITORS. (1) VISITOR MAY STAY OVERNIGHT BUT MUST ARRIVE TO THE ROOM BY 8pm.  Minor children may have two parents present. Special consideration for safety and communication needs will be reviewed on a case by case basis.   Special instructions:   [facility]- Preparing For Surgery  Before surgery, you can play an important role. Because skin is not sterile, your skin needs to be as free of germs as possible. You can reduce the number of germs on your skin by washing with CHG (chlorahexidine gluconate) Soap before surgery.  CHG is an antiseptic cleaner which kills germs and bonds with the skin to continue killing germs even after washing.    Oral Hygiene is also important to reduce your risk of infection.  Remember - BRUSH YOUR TEETH THE MORNING OF SURGERY WITH YOUR REGULAR TOOTHPASTE  Please do not use if you have an allergy to CHG or antibacterial soaps. If your skin becomes reddened/irritated stop using the CHG.  Do not shave (including legs and underarms) for at least 48 hours prior to first CHG shower. It is OK to shave your face.  Please follow these instructions carefully.   Shower the NIGHT BEFORE SURGERY and the MORNING OF SURGERY  If you chose to wash your hair, wash your hair first as usual with your normal shampoo.  After you shampoo, rinse your hair and body thoroughly  to remove the shampoo.  Use CHG Soap as you would any other liquid soap. You can apply CHG directly to the skin and wash gently with a scrungie or a clean washcloth.   Apply the CHG Soap to your body ONLY FROM THE NECK DOWN.  Do not use on open wounds or open sores. Avoid contact with your eyes, ears, mouth and genitals (private parts). Wash Face and genitals (private parts)  with your normal soap.   Wash thoroughly, paying special attention to the area where your surgery will be performed.  Thoroughly rinse your body with warm water from the neck down.  DO NOT  shower/wash with your normal soap after using and rinsing off the CHG Soap.  Pat yourself dry with a CLEAN TOWEL.  Wear CLEAN PAJAMAS to bed the night before surgery  Place CLEAN SHEETS on your bed the night before your surgery  DO NOT SLEEP WITH PETS.   Day of Surgery: Shower with CHG soap. Do not wear jewelry, make up, nail polish, gel polish, artificial nails, or any other type of covering on natural nails including finger and toenails. If patients have artificial nails, gel coating, etc. that need to be removed by a nail salon please have this removed prior to surgery. Surgery may need to be canceled/delayed if the surgeon/anesthesiologist feels like the patient is unable to be adequately monitored. Do not wear lotions, powders, perfumes, or deodorant. Do not shave 48 hours prior to surgery.   Do not bring valuables to the hospital. [facility] is not responsible for any belongings or valuables. Wear Clean/Comfortable clothing the morning of surgery Remember to brush your teeth WITH YOUR REGULAR TOOTHPASTE.   Please read over the following fact sheets that you were given.   3 days prior to your procedure or After your COVID test   You are not required to quarantine however you are required to wear a well-fitting mask when you are out and around people not in your household. If your mask becomes wet or soiled, replace with a new one.   Wash your hands often with soap and water for 20 seconds or clean your hands with an alcohol-based hand sanitizer that contains at least 60% alcohol.   Do not share personal items.   Notify your provider:  o if you are in close contact with someone who has COVID  o or if you develop a fever of 100.4 or greater, sneezing, cough, sore throat, shortness of breath or body aches.

## 2021-12-15 ENCOUNTER — Encounter (HOSPITAL_COMMUNITY)
Admission: RE | Admit: 2021-12-15 | Discharge: 2021-12-15 | Disposition: A | Discharge status: Home / Self Care | Payer: PPO | Source: Ambulatory Visit | Attending: Neurosurgery | Admitting: Neurosurgery

## 2021-12-15 ENCOUNTER — Encounter (HOSPITAL_COMMUNITY): Payer: Self-pay

## 2021-12-15 ENCOUNTER — Other Ambulatory Visit: Payer: Self-pay

## 2021-12-15 VITALS — BP 148/92 | HR 80 | Temp 98.9°F | Resp 18 | Ht <= 58 in | Wt 162.7 lb

## 2021-12-15 DIAGNOSIS — Z01818 Encounter for other preprocedural examination: Secondary | ICD-10-CM

## 2021-12-15 DIAGNOSIS — I2510 Atherosclerotic heart disease of native coronary artery without angina pectoris: Secondary | ICD-10-CM

## 2021-12-15 DIAGNOSIS — Z01812 Encounter for preprocedural laboratory examination: Secondary | ICD-10-CM | POA: Insufficient documentation

## 2021-12-15 LAB — CBC
HCT: 42.3 % (ref 36.0–46.0)
Hemoglobin: 14.5 g/dL (ref 12.0–15.0)
MCH: 28.4 pg (ref 26.0–34.0)
MCHC: 34.3 g/dL (ref 30.0–36.0)
MCV: 82.8 fL (ref 80.0–100.0)
Platelets: 406 10*3/uL — ABNORMAL HIGH (ref 150–400)
RBC: 5.11 MIL/uL (ref 3.87–5.11)
RDW: 13.5 % (ref 11.5–15.5)
WBC: 6.4 10*3/uL (ref 4.0–10.5)
nRBC: 0 % (ref 0.0–0.2)

## 2021-12-15 LAB — BASIC METABOLIC PANEL
Anion gap: 11 (ref 5–15)
BUN: 9 mg/dL (ref 8–23)
CO2: 28 mmol/L (ref 22–32)
Calcium: 10 mg/dL (ref 8.9–10.3)
Chloride: 97 mmol/L — ABNORMAL LOW (ref 98–111)
Creatinine, Ser: 0.87 mg/dL (ref 0.44–1.00)
GFR, Estimated: >60 mL/min (ref >60)
Glucose, Bld: 103 mg/dL — ABNORMAL HIGH (ref 70–99)
Potassium: 3.2 mmol/L — ABNORMAL LOW (ref 3.5–5.1)
Sodium: 136 mmol/L (ref 135–145)

## 2021-12-15 LAB — TYPE AND SCREEN
ABO/RH(D): O POS
Antibody Screen: NEGATIVE

## 2021-12-15 LAB — SURGICAL PCR SCREEN
MRSA, PCR: NEGATIVE
Staphylococcus aureus: NEGATIVE

## 2021-12-15 NOTE — Progress Notes (Signed)
PCP - Dr. Kelton Pillar (now retired), has Dr. Doristine Bosworth as a new provider but has not seen her yet.  Cardiologist - Dr. Fransico Him  PPM/ICD - n/a  Chest x-ray - n/a EKG - 08/31/21 Stress Test - 2013 ECHO - 2013 Cardiac Cath -denies   Sleep Study - denies CPAP - denies  Blood Thinner Instructions: n/a Aspirin Instructions:n/a  ERAS Protcol - Clear liquids until 1300 per anesthesia protocol PRE-SURGERY Ensure or G2- none ordered.  COVID TEST- Will need test DOS. Pt cannot come back to PAT prior to surgery date. She has other medical appts scheduled. Pt will arrive by 1300 DOS for 1600 surgery time.  Anesthesia review: No  Patient denies shortness of breath, fever, cough and chest pain at PAT appointment   All instructions explained to the patient, with a verbal understanding of the material. Patient agrees to go over the instructions while at home for a better understanding. Patient also instructed to self quarantine after being tested for COVID-19. The opportunity to ask questions was provided.

## 2021-12-18 ENCOUNTER — Encounter: Payer: Self-pay | Admitting: Nurse Practitioner

## 2021-12-18 ENCOUNTER — Ambulatory Visit
Admission: RE | Admit: 2021-12-18 | Discharge: 2021-12-18 | Disposition: A | Discharge status: Home / Self Care | Payer: PPO | Source: Ambulatory Visit | Attending: Neurosurgery | Admitting: Neurosurgery

## 2021-12-18 DIAGNOSIS — S22080A Wedge compression fracture of T11-T12 vertebra, initial encounter for closed fracture: Secondary | ICD-10-CM | POA: Diagnosis not present

## 2021-12-18 DIAGNOSIS — S32010A Wedge compression fracture of first lumbar vertebra, initial encounter for closed fracture: Secondary | ICD-10-CM

## 2021-12-20 ENCOUNTER — Inpatient Hospital Stay (HOSPITAL_COMMUNITY): Payer: PPO | Admitting: Anesthesiology

## 2021-12-20 ENCOUNTER — Encounter (HOSPITAL_COMMUNITY): Payer: Self-pay | Admitting: Neurosurgery

## 2021-12-20 ENCOUNTER — Other Ambulatory Visit: Payer: Self-pay

## 2021-12-20 ENCOUNTER — Encounter (HOSPITAL_COMMUNITY)
Admission: RE | Disposition: A | Discharge status: Home / Self Care | Payer: Self-pay | Source: Home / Self Care | Attending: Neurosurgery

## 2021-12-20 ENCOUNTER — Inpatient Hospital Stay (HOSPITAL_COMMUNITY): Payer: PPO

## 2021-12-20 ENCOUNTER — Inpatient Hospital Stay (HOSPITAL_COMMUNITY)
Admission: RE | Admit: 2021-12-20 | Discharge: 2021-12-21 | DRG: 457 | Disposition: A | Discharge status: Home / Self Care | Payer: PPO | Attending: Neurosurgery | Admitting: Neurosurgery

## 2021-12-20 DIAGNOSIS — Z811 Family history of alcohol abuse and dependence: Secondary | ICD-10-CM

## 2021-12-20 DIAGNOSIS — Z807 Family history of other malignant neoplasms of lymphoid, hematopoietic and related tissues: Secondary | ICD-10-CM | POA: Diagnosis not present

## 2021-12-20 DIAGNOSIS — I700 Atherosclerosis of aorta: Secondary | ICD-10-CM | POA: Diagnosis present

## 2021-12-20 DIAGNOSIS — Z8041 Family history of malignant neoplasm of ovary: Secondary | ICD-10-CM | POA: Diagnosis not present

## 2021-12-20 DIAGNOSIS — Z419 Encounter for procedure for purposes other than remedying health state, unspecified: Secondary | ICD-10-CM

## 2021-12-20 DIAGNOSIS — M4855XA Collapsed vertebra, not elsewhere classified, thoracolumbar region, initial encounter for fracture: Principal | ICD-10-CM | POA: Diagnosis present

## 2021-12-20 DIAGNOSIS — M4325 Fusion of spine, thoracolumbar region: Secondary | ICD-10-CM | POA: Diagnosis not present

## 2021-12-20 DIAGNOSIS — Z803 Family history of malignant neoplasm of breast: Secondary | ICD-10-CM

## 2021-12-20 DIAGNOSIS — T84028A Dislocation of other internal joint prosthesis, initial encounter: Secondary | ICD-10-CM | POA: Diagnosis present

## 2021-12-20 DIAGNOSIS — Z981 Arthrodesis status: Secondary | ICD-10-CM | POA: Diagnosis not present

## 2021-12-20 DIAGNOSIS — T84226A Displacement of internal fixation device of vertebrae, initial encounter: Secondary | ICD-10-CM | POA: Diagnosis not present

## 2021-12-20 DIAGNOSIS — Z833 Family history of diabetes mellitus: Secondary | ICD-10-CM

## 2021-12-20 DIAGNOSIS — S32010D Wedge compression fracture of first lumbar vertebra, subsequent encounter for fracture with routine healing: Secondary | ICD-10-CM

## 2021-12-20 DIAGNOSIS — Z823 Family history of stroke: Secondary | ICD-10-CM

## 2021-12-20 DIAGNOSIS — S32018A Other fracture of first lumbar vertebra, initial encounter for closed fracture: Secondary | ICD-10-CM | POA: Diagnosis not present

## 2021-12-20 DIAGNOSIS — Z808 Family history of malignant neoplasm of other organs or systems: Secondary | ICD-10-CM | POA: Diagnosis not present

## 2021-12-20 DIAGNOSIS — Z818 Family history of other mental and behavioral disorders: Secondary | ICD-10-CM | POA: Diagnosis not present

## 2021-12-20 DIAGNOSIS — Z20822 Contact with and (suspected) exposure to covid-19: Secondary | ICD-10-CM | POA: Diagnosis present

## 2021-12-20 DIAGNOSIS — M549 Dorsalgia, unspecified: Secondary | ICD-10-CM | POA: Diagnosis present

## 2021-12-20 HISTORY — PX: LAMINECTOMY WITH POSTERIOR LATERAL ARTHRODESIS LEVEL 4: SHX6338

## 2021-12-20 LAB — SARS CORONAVIRUS 2 BY RT PCR (HOSPITAL ORDER, PERFORMED IN ~~LOC~~ HOSPITAL LAB): SARS Coronavirus 2: NEGATIVE

## 2021-12-20 SURGERY — LAMINECTOMY WITH POSTERIOR LATERAL ARTHRODESIS LEVEL 4
Anesthesia: General | Site: Spine Thoracic

## 2021-12-20 MED ORDER — VITAMIN B-12 1000 MCG PO TABS
5000.0000 ug | ORAL_TABLET | Freq: Every day | ORAL | Status: DC
  Administered 2021-12-21: 5000 ug via ORAL
  Filled 2021-12-20: qty 5

## 2021-12-20 MED ORDER — ACETAMINOPHEN 650 MG RE SUPP: 650.0000 mg | RECTAL | Status: DC | PRN

## 2021-12-20 MED ORDER — MUPIROCIN 2 % EX OINT
1.0000 "application " | TOPICAL_OINTMENT | Freq: Two times a day (BID) | CUTANEOUS | Status: DC
  Administered 2021-12-20: 1 via TOPICAL

## 2021-12-20 MED ORDER — SUGAMMADEX SODIUM 200 MG/2ML IV SOLN
INTRAVENOUS | Status: DC | PRN
Start: 2021-12-20 — End: 2021-12-20
  Administered 2021-12-20: 200 mg via INTRAVENOUS

## 2021-12-20 MED ORDER — THROMBIN 20000 UNITS EX SOLR: CUTANEOUS | Status: DC | PRN

## 2021-12-20 MED ORDER — FAMOTIDINE 20 MG PO TABS
20.0000 mg | ORAL_TABLET | Freq: Every day | ORAL | Status: DC
Start: 2021-12-21 — End: 2021-12-21
  Administered 2021-12-21: 20 mg via ORAL
  Filled 2021-12-20: qty 1

## 2021-12-20 MED ORDER — MUPIROCIN 2 % EX OINT
TOPICAL_OINTMENT | CUTANEOUS | Status: AC
  Filled 2021-12-20: qty 22

## 2021-12-20 MED ORDER — GLUCOSAMINE CHONDROITIN JOINT PO TABS: ORAL_TABLET | Freq: Every day | ORAL | Status: DC

## 2021-12-20 MED ORDER — HYDROCHLOROTHIAZIDE 12.5 MG PO TABS
12.5000 mg | ORAL_TABLET | Freq: Every day | ORAL | Status: DC
Start: 2021-12-21 — End: 2021-12-21
  Administered 2021-12-21: 12.5 mg via ORAL
  Filled 2021-12-20: qty 1

## 2021-12-20 MED ORDER — PROPOFOL 10 MG/ML IV BOLUS
INTRAVENOUS | Status: AC
  Filled 2021-12-20: qty 20

## 2021-12-20 MED ORDER — SODIUM CHLORIDE 0.9% FLUSH: 3.0000 mL | Freq: Two times a day (BID) | INTRAVENOUS | Status: DC

## 2021-12-20 MED ORDER — HYDROMORPHONE HCL 1 MG/ML IJ SOLN: 0.5000 mg | INTRAMUSCULAR | Status: DC | PRN

## 2021-12-20 MED ORDER — ACETAMINOPHEN 325 MG PO TABS: 650.0000 mg | ORAL_TABLET | ORAL | Status: DC | PRN

## 2021-12-20 MED ORDER — LACTATED RINGERS IV SOLN: INTRAVENOUS | Status: DC

## 2021-12-20 MED ORDER — 0.9 % SODIUM CHLORIDE (POUR BTL) OPTIME
TOPICAL | Status: DC | PRN
  Administered 2021-12-20: 17:00:00 1000 mL

## 2021-12-20 MED ORDER — ONDANSETRON HCL 4 MG/2ML IJ SOLN: 4.0000 mg | Freq: Four times a day (QID) | INTRAMUSCULAR | Status: DC | PRN

## 2021-12-20 MED ORDER — PHENYLEPHRINE HCL-NACL 20-0.9 MG/250ML-% IV SOLN
INTRAVENOUS | Status: DC | PRN
  Administered 2021-12-20: 20 ug/min via INTRAVENOUS

## 2021-12-20 MED ORDER — CHLORHEXIDINE GLUCONATE CLOTH 2 % EX PADS: 6.0000 | MEDICATED_PAD | Freq: Once | CUTANEOUS | Status: DC

## 2021-12-20 MED ORDER — MAGNESIUM OXIDE -MG SUPPLEMENT 400 (240 MG) MG PO TABS
200.0000 mg | ORAL_TABLET | Freq: Every day | ORAL | Status: DC
  Administered 2021-12-21: 200 mg via ORAL
  Filled 2021-12-20: qty 1

## 2021-12-20 MED ORDER — LIDOCAINE-EPINEPHRINE 1 %-1:100000 IJ SOLN
INTRAMUSCULAR | Status: AC
  Filled 2021-12-20: qty 1

## 2021-12-20 MED ORDER — LEVOTHYROXINE SODIUM 25 MCG PO TABS: 25.0000 ug | ORAL_TABLET | ORAL | Status: DC

## 2021-12-20 MED ORDER — NEBIVOLOL HCL 5 MG PO TABS
5.0000 mg | ORAL_TABLET | Freq: Every day | ORAL | Status: DC
Start: 2021-12-21 — End: 2021-12-21
  Administered 2021-12-21: 5 mg via ORAL
  Filled 2021-12-20: qty 1

## 2021-12-20 MED ORDER — RISAQUAD PO CAPS
1.0000 | ORAL_CAPSULE | Freq: Every day | ORAL | Status: DC
  Administered 2021-12-21: 1 via ORAL
  Filled 2021-12-20: qty 1

## 2021-12-20 MED ORDER — FENTANYL CITRATE (PF) 100 MCG/2ML IJ SOLN: 25.0000 ug | INTRAMUSCULAR | Status: DC | PRN

## 2021-12-20 MED ORDER — EPHEDRINE 5 MG/ML INJ
INTRAVENOUS | Status: AC
  Filled 2021-12-20: qty 5

## 2021-12-20 MED ORDER — POLYVINYL ALCOHOL 1.4 % OP SOLN
1.0000 [drp] | Freq: Every day | OPHTHALMIC | Status: DC
  Filled 2021-12-20: qty 15

## 2021-12-20 MED ORDER — DEXAMETHASONE SODIUM PHOSPHATE 10 MG/ML IJ SOLN
INTRAMUSCULAR | Status: DC | PRN
  Administered 2021-12-20: 10 mg via INTRAVENOUS

## 2021-12-20 MED ORDER — PROSIGHT PO TABS
1.0000 | ORAL_TABLET | Freq: Two times a day (BID) | ORAL | Status: DC
  Administered 2021-12-21: 1 via ORAL
  Filled 2021-12-20 (×3): qty 1

## 2021-12-20 MED ORDER — CYCLOBENZAPRINE HCL 10 MG PO TABS
10.0000 mg | ORAL_TABLET | Freq: Three times a day (TID) | ORAL | Status: DC | PRN
  Administered 2021-12-20: 22:00:00 10 mg via ORAL
  Filled 2021-12-20: qty 1

## 2021-12-20 MED ORDER — GABAPENTIN 300 MG PO CAPS
300.0000 mg | ORAL_CAPSULE | Freq: Two times a day (BID) | ORAL | Status: DC
  Administered 2021-12-20 – 2021-12-21 (×2): 300 mg via ORAL
  Filled 2021-12-20 (×2): qty 1

## 2021-12-20 MED ORDER — CO-ENZYME Q10 100 MG PO CAPS: 100.0000 mg | ORAL_CAPSULE | Freq: Every day | ORAL | Status: DC

## 2021-12-20 MED ORDER — ASCORBIC ACID 500 MG PO TABS
500.0000 mg | ORAL_TABLET | Freq: Every day | ORAL | Status: DC
  Administered 2021-12-21: 500 mg via ORAL
  Filled 2021-12-20: qty 1

## 2021-12-20 MED ORDER — PHENOL 1.4 % MT LIQD: 1.0000 | OROMUCOSAL | Status: DC | PRN

## 2021-12-20 MED ORDER — PROPOFOL 1000 MG/100ML IV EMUL
INTRAVENOUS | Status: AC
  Filled 2021-12-20: qty 100

## 2021-12-20 MED ORDER — CHLORHEXIDINE GLUCONATE 0.12 % MT SOLN
15.0000 mL | Freq: Once | OROMUCOSAL | Status: AC
  Administered 2021-12-20: 14:00:00 15 mL via OROMUCOSAL
  Filled 2021-12-20: qty 15

## 2021-12-20 MED ORDER — FENTANYL CITRATE (PF) 250 MCG/5ML IJ SOLN
INTRAMUSCULAR | Status: DC | PRN
  Administered 2021-12-20 (×2): 100 ug via INTRAVENOUS

## 2021-12-20 MED ORDER — BUPIVACAINE HCL (PF) 0.25 % IJ SOLN
INTRAMUSCULAR | Status: AC
  Filled 2021-12-20: qty 30

## 2021-12-20 MED ORDER — DEXAMETHASONE SODIUM PHOSPHATE 10 MG/ML IJ SOLN
INTRAMUSCULAR | Status: AC
  Filled 2021-12-20: qty 1

## 2021-12-20 MED ORDER — SODIUM CHLORIDE 0.9% FLUSH: 3.0000 mL | INTRAVENOUS | Status: DC | PRN

## 2021-12-20 MED ORDER — ACETAMINOPHEN 500 MG PO TABS: 1000.0000 mg | ORAL_TABLET | Freq: Three times a day (TID) | ORAL | Status: DC | PRN

## 2021-12-20 MED ORDER — POTASSIUM CHLORIDE CRYS ER 10 MEQ PO TBCR
10.0000 meq | EXTENDED_RELEASE_TABLET | Freq: Two times a day (BID) | ORAL | Status: DC
  Administered 2021-12-20 – 2021-12-21 (×2): 10 meq via ORAL
  Filled 2021-12-20 (×3): qty 1

## 2021-12-20 MED ORDER — ORAL CARE MOUTH RINSE: 15.0000 mL | Freq: Once | OROMUCOSAL | Status: AC

## 2021-12-20 MED ORDER — PHENYLEPHRINE 40 MCG/ML (10ML) SYRINGE FOR IV PUSH (FOR BLOOD PRESSURE SUPPORT)
PREFILLED_SYRINGE | INTRAVENOUS | Status: DC | PRN
Start: 2021-12-20 — End: 2021-12-20
  Administered 2021-12-20 (×3): 80 ug via INTRAVENOUS

## 2021-12-20 MED ORDER — ONDANSETRON HCL 4 MG PO TABS: 4.0000 mg | ORAL_TABLET | Freq: Four times a day (QID) | ORAL | Status: DC | PRN

## 2021-12-20 MED ORDER — BUPIVACAINE LIPOSOME 1.3 % IJ SUSP
INTRAMUSCULAR | Status: DC | PRN
Start: 2021-12-20 — End: 2021-12-20
  Administered 2021-12-20: 20 mL

## 2021-12-20 MED ORDER — ALUM & MAG HYDROXIDE-SIMETH 200-200-20 MG/5ML PO SUSP: 30.0000 mL | Freq: Four times a day (QID) | ORAL | Status: DC | PRN

## 2021-12-20 MED ORDER — PHENYLEPHRINE 40 MCG/ML (10ML) SYRINGE FOR IV PUSH (FOR BLOOD PRESSURE SUPPORT)
PREFILLED_SYRINGE | INTRAVENOUS | Status: AC
  Filled 2021-12-20: qty 10

## 2021-12-20 MED ORDER — PROPOFOL 500 MG/50ML IV EMUL
INTRAVENOUS | Status: DC | PRN
  Administered 2021-12-20: 20 ug/kg/min via INTRAVENOUS

## 2021-12-20 MED ORDER — SODIUM CHLORIDE 0.9 % IV SOLN: 250.0000 mL | INTRAVENOUS | Status: DC

## 2021-12-20 MED ORDER — LIDOCAINE 2% (20 MG/ML) 5 ML SYRINGE
INTRAMUSCULAR | Status: DC | PRN
  Administered 2021-12-20: 60 mg via INTRAVENOUS

## 2021-12-20 MED ORDER — CEFAZOLIN SODIUM-DEXTROSE 2-4 GM/100ML-% IV SOLN
2.0000 g | INTRAVENOUS | Status: AC
  Administered 2021-12-20: 16:00:00 2 g via INTRAVENOUS
  Filled 2021-12-20: qty 100

## 2021-12-20 MED ORDER — LIDOCAINE-EPINEPHRINE 1 %-1:100000 IJ SOLN
INTRAMUSCULAR | Status: DC | PRN
  Administered 2021-12-20: 10 mL

## 2021-12-20 MED ORDER — VITAMIN D 25 MCG (1000 UNIT) PO TABS
1000.0000 [IU] | ORAL_TABLET | Freq: Every day | ORAL | Status: DC
  Administered 2021-12-21: 1000 [IU] via ORAL
  Filled 2021-12-20 (×2): qty 1

## 2021-12-20 MED ORDER — NAPROXEN SODIUM 275 MG PO TABS
275.0000 mg | ORAL_TABLET | Freq: Every day | ORAL | Status: DC | PRN
  Filled 2021-12-20: qty 1

## 2021-12-20 MED ORDER — PROPOFOL 10 MG/ML IV BOLUS
INTRAVENOUS | Status: DC | PRN
Start: 2021-12-20 — End: 2021-12-20
  Administered 2021-12-20: 140 mg via INTRAVENOUS

## 2021-12-20 MED ORDER — VITAMIN B-6 100 MG PO TABS
100.0000 mg | ORAL_TABLET | Freq: Every day | ORAL | Status: DC
Start: 2021-12-21 — End: 2021-12-21
  Administered 2021-12-21: 100 mg via ORAL
  Filled 2021-12-20: qty 1

## 2021-12-20 MED ORDER — PANTOPRAZOLE SODIUM 40 MG IV SOLR
40.0000 mg | Freq: Every day | INTRAVENOUS | Status: DC
  Administered 2021-12-20: 21:00:00 40 mg via INTRAVENOUS
  Filled 2021-12-20: qty 40

## 2021-12-20 MED ORDER — ONDANSETRON HCL 4 MG/2ML IJ SOLN
INTRAMUSCULAR | Status: AC
  Filled 2021-12-20: qty 2

## 2021-12-20 MED ORDER — METHENAMINE HIPPURATE 1 G PO TABS
1.0000 g | ORAL_TABLET | Freq: Two times a day (BID) | ORAL | Status: DC
  Administered 2021-12-21: 1 g via ORAL
  Filled 2021-12-20 (×2): qty 1

## 2021-12-20 MED ORDER — THROMBIN 20000 UNITS EX SOLR
CUTANEOUS | Status: AC
  Filled 2021-12-20: qty 20000

## 2021-12-20 MED ORDER — OYSTER SHELL CALCIUM/D3 500-5 MG-MCG PO TABS
1.0000 | ORAL_TABLET | Freq: Every day | ORAL | Status: DC
  Administered 2021-12-21: 1 via ORAL
  Filled 2021-12-20: qty 1

## 2021-12-20 MED ORDER — ONDANSETRON HCL 4 MG/2ML IJ SOLN
INTRAMUSCULAR | Status: DC | PRN
  Administered 2021-12-20: 4 mg via INTRAVENOUS

## 2021-12-20 MED ORDER — EPHEDRINE SULFATE-NACL 50-0.9 MG/10ML-% IV SOSY
PREFILLED_SYRINGE | INTRAVENOUS | Status: DC | PRN
  Administered 2021-12-20 (×4): 5 mg via INTRAVENOUS

## 2021-12-20 MED ORDER — CEFAZOLIN SODIUM-DEXTROSE 2-4 GM/100ML-% IV SOLN
2.0000 g | Freq: Three times a day (TID) | INTRAVENOUS | Status: DC
  Administered 2021-12-20 – 2021-12-21 (×2): 2 g via INTRAVENOUS
  Filled 2021-12-20 (×2): qty 100

## 2021-12-20 MED ORDER — FENTANYL CITRATE (PF) 250 MCG/5ML IJ SOLN
INTRAMUSCULAR | Status: AC
  Filled 2021-12-20: qty 5

## 2021-12-20 MED ORDER — BUPIVACAINE LIPOSOME 1.3 % IJ SUSP
INTRAMUSCULAR | Status: AC
  Filled 2021-12-20: qty 20

## 2021-12-20 MED ORDER — LORATADINE 10 MG PO TABS: 10.0000 mg | ORAL_TABLET | Freq: Every day | ORAL | Status: DC | PRN

## 2021-12-20 MED ORDER — LIDOCAINE 2% (20 MG/ML) 5 ML SYRINGE
INTRAMUSCULAR | Status: AC
  Filled 2021-12-20: qty 5

## 2021-12-20 MED ORDER — MENTHOL 3 MG MT LOZG
1.0000 | LOZENGE | OROMUCOSAL | Status: DC | PRN
  Administered 2021-12-21: 3 mg via ORAL
  Filled 2021-12-20: qty 9

## 2021-12-20 MED ORDER — HYDROCODONE-ACETAMINOPHEN 5-325 MG PO TABS
2.0000 | ORAL_TABLET | ORAL | Status: DC | PRN
  Administered 2021-12-20 – 2021-12-21 (×2): 2 via ORAL
  Filled 2021-12-20 (×2): qty 2

## 2021-12-20 MED ORDER — ROCURONIUM BROMIDE 10 MG/ML (PF) SYRINGE
PREFILLED_SYRINGE | INTRAVENOUS | Status: DC | PRN
Start: 2021-12-20 — End: 2021-12-20
  Administered 2021-12-20 (×2): 20 mg via INTRAVENOUS
  Administered 2021-12-20: 60 mg via INTRAVENOUS

## 2021-12-20 SURGICAL SUPPLY — 74 items
BAG COUNTER SPONGE SURGICOUNT (BAG) ×2 IMPLANT
BENZOIN TINCTURE PRP APPL 2/3 (GAUZE/BANDAGES/DRESSINGS) ×2 IMPLANT
BLADE CLIPPER SURG (BLADE) IMPLANT
BLADE SURG 11 STRL SS (BLADE) ×2 IMPLANT
BONE VIVIGEN FORMABLE 10CC (Bone Implant) ×2 IMPLANT
BUR MATCHSTICK NEURO 3.0 LAGG (BURR) ×2 IMPLANT
BUR PRECISION FLUTE 6.0 (BURR) ×2 IMPLANT
CANISTER SUCT 3000ML PPV (MISCELLANEOUS) ×2 IMPLANT
CAP LCK SPNE (Orthopedic Implant) ×6 IMPLANT
CAP LOCK SPINE RADIUS (Orthopedic Implant) IMPLANT
CAP LOCKING (Orthopedic Implant) ×12 IMPLANT
CARTRIDGE OIL MAESTRO DRILL (MISCELLANEOUS) ×1 IMPLANT
CNTNR URN SCR LID CUP LEK RST (MISCELLANEOUS) ×1 IMPLANT
CONT SPEC 4OZ STRL OR WHT (MISCELLANEOUS) ×2
COVER BACK TABLE 24X17X13 BIG (DRAPES) IMPLANT
COVER BACK TABLE 60X90IN (DRAPES) ×2 IMPLANT
DECANTER SPIKE VIAL GLASS SM (MISCELLANEOUS) ×2 IMPLANT
DERMABOND ADVANCED (GAUZE/BANDAGES/DRESSINGS) ×1
DERMABOND ADVANCED .7 DNX12 (GAUZE/BANDAGES/DRESSINGS) ×1 IMPLANT
DIFFUSER DRILL AIR PNEUMATIC (MISCELLANEOUS) ×2 IMPLANT
DRAPE C-ARM 42X72 X-RAY (DRAPES) ×4 IMPLANT
DRAPE HALF SHEET 40X57 (DRAPES) IMPLANT
DRAPE LAPAROTOMY 100X72X124 (DRAPES) ×2 IMPLANT
DRAPE POUCH INSTRU U-SHP 10X18 (DRAPES) ×2 IMPLANT
DRAPE SURG 17X23 STRL (DRAPES) ×2 IMPLANT
DRSG OPSITE 4X5.5 SM (GAUZE/BANDAGES/DRESSINGS) ×1 IMPLANT
DRSG OPSITE POSTOP 4X10 (GAUZE/BANDAGES/DRESSINGS) ×1 IMPLANT
DURAPREP 26ML APPLICATOR (WOUND CARE) ×2 IMPLANT
ELECT REM PT RETURN 9FT ADLT (ELECTROSURGICAL) ×2
ELECTRODE REM PT RTRN 9FT ADLT (ELECTROSURGICAL) ×1 IMPLANT
EVACUATOR 3/16  PVC DRAIN (DRAIN) ×2
EVACUATOR 3/16 PVC DRAIN (DRAIN) ×1 IMPLANT
GAUZE 4X4 16PLY ~~LOC~~+RFID DBL (SPONGE) IMPLANT
GAUZE SPONGE 4X4 12PLY STRL (GAUZE/BANDAGES/DRESSINGS) ×2 IMPLANT
GLOVE EXAM NITRILE XL STR (GLOVE) IMPLANT
GLOVE SURG ENC MOIS LTX SZ7 (GLOVE) IMPLANT
GLOVE SURG ENC MOIS LTX SZ8 (GLOVE) ×4 IMPLANT
GLOVE SURG LTX SZ7.5 (GLOVE) IMPLANT
GLOVE SURG UNDER POLY LF SZ7 (GLOVE) IMPLANT
GLOVE SURG UNDER POLY LF SZ8.5 (GLOVE) ×4 IMPLANT
GOWN STRL REUS W/ TWL LRG LVL3 (GOWN DISPOSABLE) IMPLANT
GOWN STRL REUS W/ TWL XL LVL3 (GOWN DISPOSABLE) ×2 IMPLANT
GOWN STRL REUS W/TWL 2XL LVL3 (GOWN DISPOSABLE) IMPLANT
GOWN STRL REUS W/TWL LRG LVL3 (GOWN DISPOSABLE)
GOWN STRL REUS W/TWL XL LVL3 (GOWN DISPOSABLE) ×4
GRAFT BN 10X1XDBM MAGNIFUSE (Bone Implant) IMPLANT
GRAFT BNE MATRIX VG FRMBL L 10 (Bone Implant) IMPLANT
GRAFT BONE MAGNIFUSE 1X10CM (Bone Implant) ×2 IMPLANT
KIT BASIN OR (CUSTOM PROCEDURE TRAY) ×2 IMPLANT
KIT INFUSE SMALL (Orthopedic Implant) ×1 IMPLANT
KIT TURNOVER KIT B (KITS) ×2 IMPLANT
NDL HYPO 25X1 1.5 SAFETY (NEEDLE) ×1 IMPLANT
NEEDLE HYPO 25X1 1.5 SAFETY (NEEDLE) ×2 IMPLANT
NS IRRIG 1000ML POUR BTL (IV SOLUTION) ×2 IMPLANT
OIL CARTRIDGE MAESTRO DRILL (MISCELLANEOUS) ×2
PACK LAMINECTOMY NEURO (CUSTOM PROCEDURE TRAY) ×2 IMPLANT
PAD ARMBOARD 7.5X6 YLW CONV (MISCELLANEOUS) ×6 IMPLANT
ROD 120MM (Rod) ×4 IMPLANT
ROD CONNECTOR END TO END (Rod) ×1 IMPLANT
ROD SPNL 120X5.5 NS TI RDS (Rod) IMPLANT
ROD TO ROD RADIUS CONNECTOR (Rod) ×1 IMPLANT
SCREW 4.75X40MM (Screw) ×2 IMPLANT
SCREW 5.75X40M (Screw) ×4 IMPLANT
SPONGE SURGIFOAM ABS GEL 100 (HEMOSTASIS) ×2 IMPLANT
SPONGE T-LAP 4X18 ~~LOC~~+RFID (SPONGE) IMPLANT
STRIP CLOSURE SKIN 1/2X4 (GAUZE/BANDAGES/DRESSINGS) ×4 IMPLANT
SUT VIC AB 0 CT1 18XCR BRD8 (SUTURE) ×2 IMPLANT
SUT VIC AB 0 CT1 8-18 (SUTURE) ×4
SUT VIC AB 2-0 CT1 18 (SUTURE) ×2 IMPLANT
SUT VICRYL 4-0 PS2 18IN ABS (SUTURE) ×2 IMPLANT
TOWEL GREEN STERILE (TOWEL DISPOSABLE) ×2 IMPLANT
TOWEL GREEN STERILE FF (TOWEL DISPOSABLE) ×2 IMPLANT
TRAY FOLEY MTR SLVR 16FR STAT (SET/KITS/TRAYS/PACK) ×2 IMPLANT
WATER STERILE IRR 1000ML POUR (IV SOLUTION) ×2 IMPLANT

## 2021-12-20 NOTE — Anesthesia Preprocedure Evaluation (Addendum)
Anesthesia Evaluation  Patient identified by MRN, date of birth, ID band Patient awake    Reviewed: Allergy & Precautions, NPO status , Patient's Chart, lab work & pertinent test results  History of Anesthesia Complications (+) PONV  Airway Mallampati: II  TM Distance: >3 FB     Dental no notable dental hx.    Pulmonary shortness of breath,    breath sounds clear to auscultation       Cardiovascular hypertension, Pt. on medications and Pt. on home beta blockers  Rhythm:Regular Rate:Normal     Neuro/Psych  Neuromuscular disease negative psych ROS   GI/Hepatic Neg liver ROS, GERD  ,  Endo/Other  Hypothyroidism   Renal/GU negative Renal ROS  negative genitourinary   Musculoskeletal  (+) Arthritis , Osteoarthritis,  Fibromyalgia -T10-L2 fusion with removal of hardware   Abdominal Normal abdominal exam  (+)   Peds  Hematology negative hematology ROS (+)   Anesthesia Other Findings   Reproductive/Obstetrics                           Anesthesia Physical Anesthesia Plan  ASA: 2  Anesthesia Plan: General   Post-op Pain Management:    Induction: Intravenous  PONV Risk Score and Plan: Ondansetron and Dexamethasone  Airway Management Planned: Oral ETT  Additional Equipment:   Intra-op Plan:   Post-operative Plan: Extubation in OR  Informed Consent: I have reviewed the patients History and Physical, chart, labs and discussed the procedure including the risks, benefits and alternatives for the proposed anesthesia with the patient or authorized representative who has indicated his/her understanding and acceptance.     Dental advisory given  Plan Discussed with: CRNA and Anesthesiologist  Anesthesia Plan Comments: (Lab Results      Component                Value               Date                      WBC                      6.4                 12/15/2021                HGB                       14.5                12/15/2021                HCT                      42.3                12/15/2021                MCV                      82.8                12/15/2021                PLT  406 (H)             12/15/2021          )       Anesthesia Quick Evaluation

## 2021-12-20 NOTE — Anesthesia Postprocedure Evaluation (Signed)
Anesthesia Post Note  Patient: Audrey [name]  Procedure(s) Performed: Thoracic Ten-Eleven, Thoracic Eleven-Twelve, Thoracic Twelve-Lumbar One, Lumbar One-Two Posterior Lateral Fusion (Spine Thoracic)     Patient location during evaluation: PACU Anesthesia Type: General Level of consciousness: awake Pain management: pain level controlled Vital Signs Assessment: post-procedure vital signs reviewed and stable Respiratory status: spontaneous breathing, nonlabored ventilation, respiratory function stable and patient connected to nasal cannula oxygen Cardiovascular status: blood pressure returned to baseline and stable Postop Assessment: no apparent nausea or vomiting Anesthetic complications: no   No notable events documented.  Last Vitals:  Vitals:   12/20/21 1925 12/20/21 1953  BP: (!) 157/76 (!) 163/97  Pulse: 79 81  Resp: 17 18  Temp: (!) 36.2 C 36.5 C  SpO2: 92% 95%    Last Pain:  Vitals:   12/20/21 2011  TempSrc:   PainSc: 0-No pain                 [name] P Shalon Councilman

## 2021-12-20 NOTE — Op Note (Signed)
Preoperative diagnosis: T12 and L1 compression fractures above and L1-S1 fusion with instability L1-L2 loosening and displacement of bilateral L1 screws  Postoperative diagnosis: Same  Procedure: Exploration of fusion removal of hardware L1 and L2 with cutting the rod below the L1 screw and removal of bilateral loose L1 screws.  2.  Pedicle screw fixation utilizing the Stryker radius pedicle screw system with bilateral pedicle screws placed at T10, T11, T12 and utilizing connectors to tie into the residual rod construct from L2 to the sacrum.  3.  Posterior lateral arthrodesis T10-11 T11-12 T12-L1 L1-L2 utilizing BMP locally harvested autograft and vivigen  Surgeon: Audrey [name]  Assistant: Nash Shearer  Anesthesia: General  EBL: Minimal  HPI: 81 year old female previously undergone an L1-L3 fusion on top of her previous L3 to the sacrum fusion and did very well initially however still experiencing progressive worsening back pain and bilateral hip and leg pain and work-up with CT scan revealed compression deformities at T12 and L1 with displacement of the L1 screws into the L1-2 disc base and haloing and loosening of the L1 screws bilaterally so due to patient progression of clinical syndrome imaging findings and failed conservative treatment I recommended exploration fusion removal of loose L1 screws and posterior lateral instrumented fusion from T10 down to L2.  I extensively went over the risks and benefits of that operation with her as well as perioperative course expectations of outcome and alternatives of surgery and she understood and agreed to proceed forward.  Operative procedure: Patient was brought into the OR was induced under general anesthesia positioned prone on the Wilson frame her back was prepped and draped in routine sterile fashion.  Roll incision was opened up and extended cephalad subperiosteal that was dissection was then carried out exposing the lamina at T9, T10, T11, T12  and then exposed the hardware and construct down to just below the L2 pedicle screw.  Then interoperative x-ray confirmed defecation appropriate level and utilizing combination of AP and lateral fluoroscopy pedicle screws were placed at T10, T11, and T12.  I then cut the rod below the L1 screw removed bilateral loose L1 screws then I used an Endo on and connector on the patient's left side and a and a side connector on the patient's right side and fashion the rod and connected and anchored all the screws from T10 down into the construct residual at L2.  I then aggressively decorticated the laminar TP and facet complexes at T10-11, T11-12, T12-L1, L1-L2 bilaterally overlaid BMP vivid gin and Magnifuse along those levels.  Relates a medium Hemovac drain injected Exparel in the fascia and closed the wound in layers with interrupted Vicryl and a running 4 subcuticular.  Dermabond benzoin Steri-Strips and a sterile dressing was applied patient recovery in stable condition.  At the end the case all needle counts and sponge counts were correct.

## 2021-12-20 NOTE — H&P (Signed)
Audrey [name] is an 81 y.o. female.   Chief Complaint: Back pain HPI: 81 year old female with severe back pain seen in the office and worked up and noted to have compression deformities at the top of her construct.  Compression fractures of L1 and T12 hardware displacement into the disc base and loosening.  So due to her failure and segmental degeneration above her fusion I recommended reexploration fusion and extension with removal of the loose L1 screws placement of screws at T10-T11-T12 and tying into her old construct.  I extensive went over the risks and benefits of this procedure with her as well as perioperative course expectations of outcome and alternatives of surgery and she understood and agreed to proceed forward.  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   Hypertension    Hypothyroidism    Obesity    PONV (postoperative nausea and vomiting)    Reflux    buring and dysphagia sx resp to PPI; egd 3/06 neg for Barretts   Tachycardia     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   Yeast infection    hx of     Past Surgical History:  Procedure Laterality Date   AXILLARY LYMPH NODE BIOPSY Left 12/27/2015   Procedure: LEFT AXILLARY SENTINEL LYMPH NODE BIOPSY;  Surgeon: [name], MD;  Location: [facility];  Service: General;  Laterality: Left;   [place] Bilateral    BREAST LUMPECTOMY WITH RADIOACTIVE SEED LOCALIZATION Left 12/01/2015   Procedure: LEFT BREAST [place] RADIOACTIVE SEED LOCALIZATION;  Surgeon: [name],  MD;  Location: [facility];  Service: General;  Laterality: Left;   BREAST SURGERY     CATARACT EXTRACTION, BILATERAL Bilateral 08/2018   COLONOSCOPY  01/2005   NEG   cortisone injections     right arm; back   EYE SURGERY     cosmetic; eyelid surgery bilat    FINGER SURGERY Left    left index finger/ cyst 02/2007   KNEE ARTHROSCOPY Right 04/13/2015   Procedure: ARTHROSCOPY RIGHT KNEE WITH MEDIAL MENISCAL DEBRIDEMENT AND CHONDROPLASTY;  Surgeon: Gaynelle Arabian, MD;  Location: [facility];  Service: Orthopedics;  Laterality: Right;   KYPHOPLASTY N/A 03/16/2020   Procedure: KYPHOPLASTY THORACIC THREE, THORACIC FOUR, THORACIC SIX;  Surgeon: Jovita Gamma, MD;  Location: [facility];  Service: Neurosurgery;  Laterality: N/A;   left rotator cuff repair Left    12/2006   LUMBAR FUSION     x3   LUMBAR LAMINECTOMY     TONSILLECTOMY      Family History  Problem Relation Age of Onset   Diabetes Mother    Breast cancer Mother        dx. late 64s-early 2s; "metastasis to throat"?   Hodgkin's lymphoma Father 31   Breast cancer Sister        maternal half-sister dx. w/ DCIS in her late 28s;   Other Sister  learning disabilities; lives on her own   Diabetes Sister    Alcohol abuse Brother    Breast cancer Maternal Aunt        dx. 66s   Ovarian cancer Maternal Aunt 59   Breast cancer Paternal Aunt        dx. early 72s   Heart Problems Paternal Aunt    Stroke Maternal Grandfather    Depression Paternal Grandmother    Heart Problems Paternal Grandmother    Skin cancer Daughter        dx. early 50s   Other Daughter        insterstitial cystitis   Neuropathy Neg Hx    Social History:  reports that she has never smoked. She has never used smokeless tobacco. She reports that she does not currently use drugs. She reports that she does not drink alcohol.  Allergies:  Allergies  Allergen Reactions   Contrast Media [Iodinated Contrast Media] Shortness Of Breath   Gadobenate Anaphylaxis   Other  Swelling    Cat gut sutures Cats and dogs                Derma bond- intolerance- itching- tolerates with Benadryl       Cymbalta [Duloxetine Hcl] Diarrhea   Amoxicillin Nausea And Vomiting and Rash   Macrodantin [Nitrofurantoin] Rash    Medications Prior to Admission  Medication Sig Dispense Refill   acetaminophen (TYLENOL) 500 MG tablet Take 1,000 mg by mouth every 8 (eight) hours as needed for mild pain.     Calcium-Vitamin D-Vitamin K (CALCIUM + D) [phone] MG-UNT-MCG CHEW Chew 1 tablet by mouth daily.     cholecalciferol (VITAMIN D) 25 MCG (1000 UNIT) tablet Take 1,000 Units by mouth daily.      Co-Enzyme Q10 100 MG CAPS Take 100 mg by mouth daily.      CRANBERRY PO Take 2 tablets by mouth in the morning and at bedtime.     Cyanocobalamin (VITAMIN B-12) 5000 MCG SUBL Place 5,000 mcg under the tongue daily.      diclofenac Sodium (VOLTAREN) 1 % GEL Apply 1 application topically 2 (two) times daily as needed for pain.     famotidine (PEPCID) 20 MG tablet Take 20 mg by mouth daily.     gabapentin (NEURONTIN) 300 MG capsule Take 1 tablet in the morning and 4 tablets at bedtime 450 capsule 3   Glucos-Chondroit-Hyaluron-MSM (GLUCOSAMINE CHONDROITIN JOINT PO) Take 1 tablet by mouth daily.     hydrochlorothiazide (HYDRODIURIL) 12.5 MG tablet Take 12.5 mg by mouth daily.     Lactobacillus-Inulin (CULTURELLE DIGESTIVE DAILY PO) Take 1 tablet by mouth daily.     loratadine (CLARITIN) 10 MG tablet Take 10 mg by mouth daily as needed for allergies.      Magnesium Oxide 200 MG TABS Take 200 mg by mouth daily.     methenamine (HIPREX) 1 g tablet Take 1 g by mouth 2 (two) times daily with a meal.     Multiple Vitamins-Minerals (PRESERVISION AREDS 2) CAPS Take 1 tablet by mouth 2 (two) times daily.      naproxen sodium (ALEVE) 220 MG tablet Take 220 mg by mouth daily as needed (pain).     nebivolol (BYSTOLIC) 5 MG tablet Take 5 mg by mouth daily.     Polyethyl Glyc-Propyl Glyc PF (SYSTANE ULTRA  PF) 0.4-0.3 % SOLN Place 1 drop into both eyes daily.     potassium chloride (KLOR-CON) 10 MEQ tablet Take 1 tablet (10  mEq total) by mouth daily. Take 2 tabs for one week then change to once daily (Patient taking differently: Take 10 mEq by mouth 2 (two) times daily.) 60 tablet 2   pyridOXINE (VITAMIN B-6) 100 MG tablet Take 100 mg by mouth daily.     vitamin C (ASCORBIC ACID) 500 MG tablet Take 500 mg by mouth daily.     levothyroxine (SYNTHROID, LEVOTHROID) 25 MCG tablet Take 25 mcg by mouth 4 (four) times a week. Mon, Wed, Fri, and Sat only.      Results for orders placed or performed during the hospital encounter of 12/20/21 (from the past 48 hour(s))  SARS Coronavirus 2 by RT PCR (hospital order, performed in [facility] hospital lab) Nasopharyngeal Nasopharyngeal Swab     Status: None   Collection Time: 12/20/21  1:20 PM   Specimen: Nasopharyngeal Swab  Result Value Ref Range   SARS Coronavirus 2 NEGATIVE NEGATIVE    Comment: (NOTE) SARS-CoV-2 target nucleic acids are NOT DETECTED.  The SARS-CoV-2 RNA is generally detectable in upper and lower respiratory specimens during the acute phase of infection. The lowest concentration of SARS-CoV-2 viral copies this assay can detect is 250 copies / mL. A negative result does not preclude SARS-CoV-2 infection and should not be used as the sole basis for treatment or other patient management decisions.  A negative result may occur with improper specimen collection / handling, submission of specimen other than nasopharyngeal swab, presence of viral mutation(s) within the areas targeted by this assay, and inadequate number of viral copies (<250 copies / mL). A negative result must be combined with clinical observations, patient history, and epidemiological information.  Fact Sheet for Patients:   StrictlyIdeas.no  Fact Sheet for Healthcare Providers: BankingDealers.co.za  This test is not yet  approved or  cleared by the [place] FDA and has been authorized for detection and/or diagnosis of SARS-CoV-2 by FDA under an Emergency Use Authorization (EUA).  This EUA will remain in effect (meaning this test can be used) for the duration of the COVID-19 declaration under Section 564(b)(1) of the Act, 21 U.S.C. section 360bbb-3(b)(1), unless the authorization is terminated or revoked sooner.  Performed at [facility], [place] [address] [place], [place] [address]    No results found.  Review of Systems  Musculoskeletal:  Positive for back pain.  Neurological:  Positive for weakness and numbness.   Blood pressure (!) 167/88, pulse 92, temperature 97.8 F (36.6 C), temperature source Oral, resp. rate 18, height 4\' 10"  (1.473 m), weight 73.5 kg, SpO2 99 %. Physical Exam HENT:     Head: Normocephalic.     Right Ear: Tympanic membrane normal.     Nose: Nose normal.     Mouth/Throat:     Mouth: Mucous membranes are moist.  Eyes:     Pupils: Pupils are equal, round, and reactive to light.  Cardiovascular:     Rate and Rhythm: Normal rate.  Pulmonary:     Effort: Pulmonary effort is normal.  Abdominal:     General: Abdomen is flat.  Musculoskeletal:        General: Normal range of motion.  Skin:    General: Skin is warm.  Neurological:     General: No focal deficit present.     Mental Status: She is alert.     Comments: Strength is 5 and 5 iliopsoas, quads, hamstrings, gastroc, tibialis, and EHL.     Assessment/Plan 81 year old female presents for reexploration fusion with extension to  T10.  Elaina Hoops, MD 12/20/2021, 3:20 PM

## 2021-12-20 NOTE — Transfer of Care (Signed)
Immediate Anesthesia Transfer of Care Note  Patient: Audrey [name]  Procedure(s) Performed: Thoracic Ten-Eleven, Thoracic Eleven-Twelve, Thoracic Twelve-Lumbar One, Lumbar One-Two Posterior Lateral Fusion (Spine Thoracic)  Patient Location: PACU  Anesthesia Type:General  Level of Consciousness: awake and drowsy  Airway & Oxygen Therapy: Patient Spontanous Breathing and Patient connected to face mask oxygen  Post-op Assessment: Report given to RN and Post -op Vital signs reviewed and stable  Post vital signs: Reviewed and stable  Last Vitals:  Vitals Value Taken Time  BP 156/75 12/20/21 1852  Temp    Pulse 91 12/20/21 1856  Resp 19 12/20/21 1856  SpO2 96 % 12/20/21 1856  Vitals shown include unvalidated device data.  Last Pain:  Vitals:   12/20/21 1343  TempSrc:   PainSc: 0-No pain         Complications: No notable events documented.

## 2021-12-20 NOTE — Anesthesia Procedure Notes (Signed)
Procedure Name: Intubation Date/Time: 12/20/2021 4:15 PM Performed by: Inda Coke, CRNA Pre-anesthesia Checklist: Patient identified, Emergency Drugs available, Suction available and Patient being monitored Patient Re-evaluated:Patient Re-evaluated prior to induction Oxygen Delivery Method: Circle System Utilized Preoxygenation: Pre-oxygenation with 100% oxygen Induction Type: IV induction Ventilation: Mask ventilation without difficulty and Oral airway inserted - appropriate to patient size Laryngoscope Size: Mac and 3 Grade View: Grade I Tube type: Oral Tube size: 7.0 mm Number of attempts: 1 Airway Equipment and Method: Stylet and Oral airway Placement Confirmation: ETT inserted through vocal cords under direct vision, positive ETCO2 and breath sounds checked- equal and bilateral Secured at: 21 cm Tube secured with: Tape Dental Injury: Teeth and Oropharynx as per pre-operative assessment

## 2021-12-21 ENCOUNTER — Encounter (HOSPITAL_COMMUNITY): Payer: Self-pay | Admitting: Neurosurgery

## 2021-12-21 MED ORDER — METHOCARBAMOL 500 MG PO TABS: 500.0000 mg | ORAL_TABLET | Freq: Four times a day (QID) | ORAL | 0 refills | Status: DC

## 2021-12-21 MED ORDER — HYDROCODONE-ACETAMINOPHEN 5-325 MG PO TABS
1.0000 | ORAL_TABLET | ORAL | 0 refills | Status: AC | PRN
Start: 2021-12-21 — End: 2022-12-21

## 2021-12-21 NOTE — Plan of Care (Signed)
Pt doing well. Pt given D/C instructions with verbal understanding, Rx's were sent to the pharmacy. Pt's incision is clean and dry with no sign of infection. Pt's IV and Hemovac were removed prior to D/C. Pt D/C'd home via wheelchair per MD order. Pt D/C'd home via wheelchair per MD order. Pt is stable @ D/C and has no other needs at this time. Holli Humbles, RN

## 2021-12-21 NOTE — Discharge Summary (Signed)
Physician Discharge Summary  Patient ID: Audrey [name] MRN: [id number] DOB/AGE: 1941/01/13 81 y.o.  Admit date: 12/20/2021 Discharge date: 12/21/2021  Admission Diagnoses:  T12 and L1 compression fractures above and L1-S1 fusion with instability L1-L2 loosening and displacement of bilateral L1 screws    Discharge Diagnoses: same   Discharged Condition: good  Hospital Course: The patient was admitted on 12/20/2021 and taken to the operating room where the patient underwent extension of fusion T10-L1. The patient tolerated the procedure well and was taken to the recovery room and then to the floor in stable condition. The hospital course was routine. There were no complications. The wound remained clean dry and intact. Pt had appropriate back soreness. No complaints of leg pain or new N/T/W. The patient remained afebrile with stable vital signs, and tolerated a regular diet. The patient continued to increase activities, and pain was well controlled with oral pain medications.   Consults: None  Significant Diagnostic Studies:  Results for orders placed or performed during the hospital encounter of 12/20/21  SARS Coronavirus 2 by RT PCR (hospital order, performed in [facility] hospital lab) Nasopharyngeal Nasopharyngeal Swab   Specimen: Nasopharyngeal Swab  Result Value Ref Range   SARS Coronavirus 2 NEGATIVE NEGATIVE    DG THORACOLUMABAR SPINE  Result Date: 12/20/2021 CLINICAL DATA:  Elective surgery. EXAM: THORACOLUMBAR SPINE 1V COMPARISON:  Preoperative CT. FINDINGS: Three fluoroscopic spot views of the thoracolumbar junction obtained in the operating room in frontal and lateral projection. Prior posterior rod with intrapedicular screw fusion, with new pedicle screws at 3 contiguous levels. Exact levels are difficult to delineate due to coned view. Fluoroscopy time 1 minutes 7 seconds. Dose 17.70 mGy. IMPRESSION: Fluoroscopic spot views during spinal fusion. Please reference operative  report for details. Electronically Signed   By: Keith Rake M.D.   On: 12/20/2021 18:25   CT THORACIC SPINE WO CONTRAST  Result Date: 12/19/2021 CLINICAL DATA:  Evaluate T12 and L1 compression fractures. Preop for fusion. EXAM: CT THORACIC SPINE WITHOUT CONTRAST TECHNIQUE: Multidetector CT images of the thoracic were obtained using the standard protocol without intravenous contrast. RADIATION DOSE REDUCTION: This exam was performed according to the departmental dose-optimization program which includes automated exposure control, adjustment of the mA and/or kV according to patient size and/or use of iterative reconstruction technique. COMPARISON:  CT lumbar spine 07/13/2021 and 11/24/2021. FINDINGS: Alignment: Normal Vertebrae: Remote vertebral augmentation changes at T3, T4 and T6. Compression fractures noted at T12 and L1. The L1 pedicle screws are loose and breach the superior endplate of L1. No retropulsion or canal compromise. No other definite acute spine fractures are identified. Paraspinal and other soft tissues: There is extraosseous bone cement at T3, T4 and T6 with bone cement in the spinal canal but no obvious cord compression. At T3 there is evidence of bone cement in the venous plexus bilaterally and embolized bone cement is noted in the lungs. Disc levels: Bone cement in the spinal canal at T2-3 but no significant canal compromise. Similar findings at T4 and T6. No large disc protrusions or canal compromise. IMPRESSION: 1. Remote vertebral augmentation changes at T3, T4 and T6. 2. 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 3. Acute/subacute compression fractures at T12 and L1. The L1 pedicle screws are loose and breach the superior endplate of L1. No retropulsion or canal compromise. 4. No large disc protrusions or canal compromise. Electronically Signed   By: Mamie Nick.  Gallerani M.D.   On: 12/19/2021 12:42   CT LUMBAR SPINE WO  CONTRAST  Result Date: 11/25/2021 CLINICAL DATA:  Low back pain, left worse than right. Multiple lumbar surgeries. EXAM: CT LUMBAR SPINE WITHOUT CONTRAST TECHNIQUE: Multidetector CT imaging of the lumbar spine was performed without intravenous contrast administration. Multiplanar CT image reconstructions were also generated. COMPARISON:  Lumbar spine CT 07/13/2021. Lumbar spine radiographs 10/10/2021 and 11/16/2021. FINDINGS: Segmentation: 5 lumbar type vertebrae. Alignment: Chronic fused grade 1 anterolisthesis of L3 on L4, grade 1 retrolisthesis of L4 on L5, and grade 1/2 retrolisthesis of L5 on S1. Reduction of L2-3 retrolisthesis since the prior CT following interval fusion. Vertebrae: Sequelae of prior posterior and interbody fusion are again identified at L3-4, L4-5, and L5-S1 with solid arthrodesis at each level. Since the prior CT, posterior fusion has been extended superiorly to L1. There is moderate lucency diffusely about both L1 pedicle screws, and both screws mildly breach the L1 superior endplate. There is an unhealed L1 superior endplate compression fracture with 15% vertebral body height loss, new from the prior CT though present on the interval radiographs. There is also a T12 compression fracture with 30% vertebral body height loss which is new from the prior CT and has progressed from 10/10/2021. Interbody fusion has been performed at L2-3 with significant incorporation of the interbody grafts. Paraspinal and other soft tissues: Mild paravertebral soft tissue edema at T12 and L1. Abdominal aortic atherosclerosis without aneurysm. Postoperative changes in the posterior lumbar soft tissues. Disc levels: T11-12: Mild facet arthrosis and at most minimal disc bulging without evidence of significant stenosis. T12-L1: Mild disc bulging with annular calcification and mild facet arthrosis. No evidence of significant stenosis. L1-2: Posterior fusion. Retrolisthesis with bulging uncovered disc. No  evidence of significant stenosis. L2-3 through L5-S1: Posterior decompression and fusion without evidence of significant residual stenosis. IMPRESSION: 1. T12 and L1 compression fractures, new from 06/2021. 2. Bilateral L1 pedicle screw loosening. 3. Developing solid interbody arthrodesis at L2-3. 4. Solid L3-S1 fusion without evidence of significant residual stenosis. 5. Aortic Atherosclerosis (ICD10-I70.0). Electronically Signed   By: Logan Bores M.D.   On: 11/25/2021 21:17   DG C-Arm 1-60 Min-No Report  Result Date: 12/20/2021 Fluoroscopy was utilized by the requesting physician.  No radiographic interpretation.    Antibiotics:  Anti-infectives (From admission, onward)    Start     Dose/Rate Route Frequency Ordered Stop   12/21/21 0800  methenamine (HIPREX) tablet 1 g        1 g Oral 2 times daily with meals 12/20/21 1952     12/21/21 0600  ceFAZolin (ANCEF) IVPB 2g/100 mL premix        2 g 200 mL/hr over 30 Minutes Intravenous On call to O.R. 12/20/21 1318 12/20/21 1620   12/21/21 0000  ceFAZolin (ANCEF) IVPB 2g/100 mL premix        2 g 200 mL/hr over 30 Minutes Intravenous Every 8 hours 12/20/21 1952 12/22/21 2359       Discharge Exam: Blood pressure 136/72, pulse 96, temperature 98 F (36.7 C), temperature source Oral, resp. rate 16, height 4\' 10"  (1.473 m), weight 73.5 kg, SpO2 96 %. Neurologic: Grossly normal Ambulating and voiding well incision cdi   Discharge Medications:   Allergies as of 12/21/2021       Reactions   Contrast Media [iodinated Contrast Media] Shortness Of Breath   Gadobenate Anaphylaxis   Other Swelling   Cat gut sutures Cats and dogs  Derma bond- intolerance- itching- tolerates with Benadryl       Cymbalta [duloxetine Hcl] Diarrhea   Amoxicillin Nausea And Vomiting, Rash   Macrodantin [nitrofurantoin] Rash        Medication List     TAKE these medications    acetaminophen 500 MG tablet Commonly known as: TYLENOL Take 1,000  mg by mouth every 8 (eight) hours as needed for mild pain.   Calcium + D [phone] MG-UNT-MCG Chew Generic drug: Calcium-Vitamin D-Vitamin K Chew 1 tablet by mouth daily.   cholecalciferol 25 MCG (1000 UNIT) tablet Commonly known as: VITAMIN D Take 1,000 Units by mouth daily.   Co-Enzyme Q10 100 MG Caps Take 100 mg by mouth daily.   CRANBERRY PO Take 2 tablets by mouth in the morning and at bedtime.   CULTURELLE DIGESTIVE DAILY PO Take 1 tablet by mouth daily.   diclofenac Sodium 1 % Gel Commonly known as: VOLTAREN Apply 1 application topically 2 (two) times daily as needed for pain.   famotidine 20 MG tablet Commonly known as: PEPCID Take 20 mg by mouth daily.   gabapentin 300 MG capsule Commonly known as: NEURONTIN Take 1 tablet in the morning and 4 tablets at bedtime   GLUCOSAMINE CHONDROITIN JOINT PO Take 1 tablet by mouth daily.   hydrochlorothiazide 12.5 MG tablet Commonly known as: HYDRODIURIL Take 12.5 mg by mouth daily.   HYDROcodone-acetaminophen 5-325 MG tablet Commonly known as: NORCO/VICODIN Take 1 tablet by mouth every 4 (four) hours as needed for moderate pain.   levothyroxine 25 MCG tablet Commonly known as: SYNTHROID Take 25 mcg by mouth 4 (four) times a week. Mon, Wed, Fri, and Sat only.   loratadine 10 MG tablet Commonly known as: CLARITIN Take 10 mg by mouth daily as needed for allergies.   Magnesium Oxide 200 MG Tabs Take 200 mg by mouth daily.   methenamine 1 g tablet Commonly known as: HIPREX Take 1 g by mouth 2 (two) times daily with a meal.   methocarbamol 500 MG tablet Commonly known as: Robaxin Take 1 tablet (500 mg total) by mouth 4 (four) times daily.   naproxen sodium 220 MG tablet Commonly known as: ALEVE Take 220 mg by mouth daily as needed (pain).   nebivolol 5 MG tablet Commonly known as: BYSTOLIC Take 5 mg by mouth daily.   potassium chloride 10 MEQ tablet Commonly known as: KLOR-CON Take 1 tablet (10 mEq  total) by mouth daily. Take 2 tabs for one week then change to once daily What changed:  when to take this additional instructions   PreserVision AREDS 2 Caps Take 1 tablet by mouth 2 (two) times daily.   pyridOXINE 100 MG tablet Commonly known as: VITAMIN B-6 Take 100 mg by mouth daily.   Systane Ultra PF 0.4-0.3 % Soln Generic drug: Polyethyl Glyc-Propyl Glyc PF Place 1 drop into both eyes daily.   Vitamin B-12 5000 MCG Subl Place 5,000 mcg under the tongue daily.   vitamin C 500 MG tablet Commonly known as: ASCORBIC ACID Take 500 mg by mouth daily.        Disposition: home   Final Dx: extension of fusion T10-L1  Discharge Instructions      Remove dressing in 72 hours   Complete by: As directed    Call MD for:  difficulty breathing, headache or visual disturbances   Complete by: As directed    Call MD for:  hives   Complete by: As directed    Call MD  for:  persistant dizziness or light-headedness   Complete by: As directed    Call MD for:  persistant nausea and vomiting   Complete by: As directed    Call MD for:  redness, tenderness, or signs of infection (pain, swelling, redness, odor or green/yellow discharge around incision site)   Complete by: As directed    Call MD for:  severe uncontrolled pain   Complete by: As directed    Call MD for:  temperature >100.4   Complete by: As directed    Diet - low sodium heart healthy   Complete by: As directed    Driving Restrictions   Complete by: As directed    No driving for 2 weeks, no riding in the car for 1 week   Increase activity slowly   Complete by: As directed    Lifting restrictions   Complete by: As directed    No lifting more than 8 lbs          Signed: Ocie Cornfield Jaelan Rasheed 12/21/2021, 7:39 AM

## 2021-12-21 NOTE — Evaluation (Signed)
Occupational Therapy Evaluation Patient Details Name: Audrey [name] MRN: [id number] DOB: [birth year] Today's Date: 12/21/2021   History of Present Illness Pt is a 81 y/o female who presents s/p T10-L2 laminectomy with posterior lateral arthrodesis on 12/20/2021. PMH significant for   Clinical Impression   Patient evaluated by Occupational Therapy with no further acute OT needs identified. All education has been completed and the patient has no further questions. Pt is able to complete ADLs with set up/supervision - min A, and demonstrates good awareness of back precautions.  See below for any follow-up Occupational Therapy or equipment needs. OT is signing off. Thank you for this referral.       Recommendations for follow up therapy are one component of a multi-disciplinary discharge planning process, led by the attending physician.  Recommendations may be updated based on patient status, additional functional criteria and insurance authorization.   Follow Up Recommendations  No OT follow up    Assistance Recommended at Discharge Intermittent Supervision/Assistance  Patient can return home with the following A little help with bathing/dressing/bathroom;Assistance with cooking/housework;Assist for transportation    Functional Status Assessment  Patient has had a recent decline in their functional status and demonstrates the ability to make significant improvements in function in a reasonable and predictable amount of time.  Equipment Recommendations  None recommended by OT    Recommendations for Other Services       Precautions / Restrictions Precautions Precautions: Back Precaution Booklet Issued: No Precaution Comments: Pt is independent with precautions Required Braces or Orthoses: Spinal Brace Spinal Brace: Lumbar corset;Applied in sitting position Restrictions Weight Bearing Restrictions: No      Mobility Bed Mobility               General bed mobility  comments: sitting EOB    Transfers Overall transfer level: Needs assistance                        Balance Overall balance assessment: Mild deficits observed, not formally tested                                         ADL either performed or assessed with clinical judgement   ADL Overall ADL's : Needs assistance/impaired Eating/Feeding: Independent   Grooming: Wash/dry hands;Wash/dry face;Oral care;Supervision/safety;Standing   Upper Body Bathing: Set up;Supervision/ safety;Sitting   Lower Body Bathing: Min guard;Sit to/from stand   Upper Body Dressing : Set up;Supervision/safety;Standing   Lower Body Dressing: Minimal assistance;Sit to/from stand Lower Body Dressing Details (indicate cue type and reason): difficulty accessing feet, but can perform figure 4.  Spouse will assist as needed Toilet Transfer: Supervision/safety;Ambulation;Comfort height toilet   Toileting- Clothing Manipulation and Hygiene: Supervision/safety;Sit to/from stand       Functional mobility during ADLs: Supervision/safety       Vision Baseline Vision/History: 1 Wears glasses Ability to See in Adequate Light: 0 Adequate Patient Visual Report: No change from baseline       Perception     Praxis      Pertinent Vitals/Pain Pain Assessment Pain Assessment: Faces Faces Pain Scale: Hurts a little bit Pain Location: back Pain Descriptors / Indicators: Operative site guarding Pain Intervention(s): Monitored during session, Repositioned     Hand Dominance Right   Extremity/Trunk Assessment Upper Extremity Assessment Upper Extremity Assessment: Overall WFL for tasks assessed   Lower Extremity Assessment Lower Extremity  Assessment: Defer to PT evaluation   Cervical / Trunk Assessment Cervical / Trunk Assessment: Back Surgery   Communication Communication Communication: No difficulties   Cognition Arousal/Alertness: Awake/alert Behavior During Therapy: WFL  for tasks assessed/performed Overall Cognitive Status: Within Functional Limits for tasks assessed                                       General Comments  pt able to don/doff brace independently    Exercises     Shoulder Instructions      Home Living Family/patient expects to be discharged to:: Private residence Living Arrangements: Spouse/significant other Available Help at Discharge: Family;Available 24 hours/day Type of Home: House Home Access: Stairs to enter     Home Layout: Multi-level;Able to live on main level with bedroom/bathroom Alternate Level Stairs-Number of Steps: 12-14 winding staircase to sewing room Alternate Level Stairs-Rails: Right;Left Bathroom Shower/Tub: Tub/shower unit;Walk-in shower;Tub only   Bathroom Toilet: Handicapped height Bathroom Accessibility: Yes   Home Equipment: Conservation officer, nature (2 wheels);Shower seat;Adaptive equipment Adaptive Equipment: Reacher Additional Comments: spouse is very supportive      Prior Functioning/Environment Prior Level of Function : Needs assist             Mobility Comments: Flexed and limited by pain but overall independent ADLs Comments: Pt reports spouse has been helping intermittently with LB ADLs and IADLs        OT Problem List: Decreased activity tolerance;Pain      OT Treatment/Interventions:      OT Goals(Current goals can be found in the care plan section) Acute Rehab OT Goals Patient Stated Goal: to have less pain OT Goal Formulation: All assessment and education complete, DC therapy  OT Frequency:      Co-evaluation              AM-PAC OT "6 Clicks" Daily Activity     Outcome Measure Help from another person eating meals?: None Help from another person taking care of personal grooming?: A Little Help from another person toileting, which includes using toliet, bedpan, or urinal?: A Little Help from another person bathing (including washing, rinsing, drying)?: A  Little Help from another person to put on and taking off regular upper body clothing?: A Little Help from another person to put on and taking off regular lower body clothing?: A Little 6 Click Score: 19   End of Session Equipment Utilized During Treatment: Back brace Nurse Communication: Mobility status  Activity Tolerance: Patient tolerated treatment well Patient left: in bed (EOB)  OT Visit Diagnosis: Pain Pain - part of body:  (back)                Time: 7062-3762 OT Time Calculation (min): 10 min Charges:  OT General Charges $OT Visit: 1 Visit OT Evaluation $OT Eval Low Complexity: 1 Low  Nilsa Nutting., OTR/L Acute Rehabilitation Services Pager [phone] Office [phone]   Lucille Passy M 12/21/2021, 9:41 AM

## 2021-12-21 NOTE — Evaluation (Signed)
Physical Therapy Evaluation  Patient Details Name: Audrey [name] MRN: [id number] DOB: 1941/02/10 Today's Date: 12/21/2021  History of Present Illness  Pt is a 81 y/o female who presents s/p T10-L2 laminectomy with posterior lateral arthrodesis on 12/20/2021. PMH significant for breast CA, diastolic dysfunction, fibromyalgia, peripheral neuropathy, HTN, hypothyroidism, tachycardia, T3 T4 T6 kyphoplasty 2021, lumbar fusion 2022.    Clinical Impression  Pt admitted with above diagnosis. At the time of PT eval, pt was able to demonstrate transfers and ambulation with up to min assist without AD, and supervision with SPC. Pt was educated on precautions, brace application/wearing schedule, appropriate activity progression, and car transfer. Pt currently with functional limitations due to the deficits listed below (see PT Problem List). Pt will benefit from skilled PT to increase their independence and safety with mobility to allow discharge to the venue listed below.         Recommendations for follow up therapy are one component of a multi-disciplinary discharge planning process, led by the attending physician.  Recommendations may be updated based on patient status, additional functional criteria and insurance authorization.  Follow Up Recommendations No PT follow up    Assistance Recommended at Discharge PRN  Patient can return home with the following  A little help with walking and/or transfers;Help with stairs or ramp for entrance    Equipment Recommendations None recommended by PT  Recommendations for Other Services       Functional Status Assessment Patient has had a recent decline in their functional status and demonstrates the ability to make significant improvements in function in a reasonable and predictable amount of time.     Precautions / Restrictions Precautions Precautions: Back Precaution Booklet Issued: No Precaution Comments: Pt is independent with  precautions Required Braces or Orthoses: Spinal Brace Spinal Brace: Lumbar corset;Applied in sitting position Restrictions Weight Bearing Restrictions: No      Mobility  Bed Mobility Overal bed mobility: Modified Independent Bed Mobility: Rolling, Sidelying to Sit           General bed mobility comments: No assist required. Overall good technique with rails and slightly elevated HOB. Pt has an adjustable bed at home.    Transfers Overall transfer level: Needs assistance Equipment used: None, Straight cane Transfers: Sit to/from Stand Sit to Stand: Supervision, Modified independent (Device/Increase time)           General transfer comment: Close supervision initially without SPC, however by end of session pt progressed to mod I with [facility] for support.    Ambulation/Gait Ambulation/Gait assistance: Min assist, Supervision Gait Distance (Feet): 300 Feet Assistive device: None, Straight cane Gait Pattern/deviations: Step-through pattern, Decreased stride length, Drifts right/left, Staggering left Gait velocity: Decreased Gait velocity interpretation: <1.31 ft/sec, indicative of household ambulator   General Gait Details: Overall good posture. Initially without AD and pt with occasional LOB requiring light min assist to recover. With SPC, pt improved balance and was at a supervision level.  Stairs         General stair comments: Declined stair training as pt reports she does not have to negotiate stairs at home and can live on the main level.  Wheelchair Mobility    Modified Rankin (Stroke Patients Only)       Balance Overall balance assessment: Needs assistance Sitting-balance support: Feet supported, No upper extremity supported Sitting balance-Leahy Scale: Fair     Standing balance support: No upper extremity supported, Reliant on assistive device for balance  Pertinent Vitals/Pain Pain Assessment Pain  Assessment: Faces Faces Pain Scale: Hurts little more Pain Location: Incision site Pain Descriptors / Indicators: Operative site guarding Pain Intervention(s): Limited activity within patient's tolerance, Monitored during session, Repositioned    Home Living Family/patient expects to be discharged to:: Private residence Living Arrangements: Spouse/significant other Available Help at Discharge: Family;Available 24 hours/day Type of Home: House Home Access: Stairs to enter     Alternate Level Stairs-Number of Steps: 12-14 winding staircase to sewing room Home Layout: Multi-level;Able to live on main level with bedroom/bathroom Home Equipment: Rolling Walker (2 wheels);Shower seat;Adaptive equipment Additional Comments: spouse is very supportive    Prior Function Prior Level of Function : Needs assist             Mobility Comments: Flexed and limited by pain but overall independent ADLs Comments: Pt reports spouse has been helping intermittently with LB ADLs and IADLs     Hand Dominance   Dominant Hand: Right    Extremity/Trunk Assessment   Upper Extremity Assessment Upper Extremity Assessment: Overall WFL for tasks assessed    Lower Extremity Assessment Lower Extremity Assessment: Defer to PT evaluation    Cervical / Trunk Assessment Cervical / Trunk Assessment: Back Surgery  Communication   Communication: No difficulties  Cognition Arousal/Alertness: Awake/alert Behavior During Therapy: WFL for tasks assessed/performed Overall Cognitive Status: Within Functional Limits for tasks assessed                                          General Comments General comments (skin integrity, edema, etc.): pt able to don/doff brace independently    Exercises     Assessment/Plan    PT Assessment Patient needs continued PT services  PT Problem List Decreased strength;Decreased activity tolerance;Decreased balance;Decreased mobility;Decreased knowledge of  use of DME;Decreased safety awareness;Decreased knowledge of precautions;Pain       PT Treatment Interventions DME instruction;Gait training;Functional mobility training;Therapeutic activities;Therapeutic exercise;Neuromuscular re-education;Patient/family education    PT Goals (Current goals can be found in the Care Plan section)  Acute Rehab PT Goals Patient Stated Goal: Decrease pain and return home PT Goal Formulation: With patient Time For Goal Achievement: 12/28/21 Potential to Achieve Goals: Good    Frequency Min 5X/week     Co-evaluation               AM-PAC PT "6 Clicks" Mobility  Outcome Measure Help needed turning from your back to your side while in a flat bed without using bedrails?: None Help needed moving from lying on your back to sitting on the side of a flat bed without using bedrails?: None Help needed moving to and from a bed to a chair (including a wheelchair)?: A Little Help needed standing up from a chair using your arms (e.g., wheelchair or bedside chair)?: A Little Help needed to walk in hospital room?: A Little Help needed climbing 3-5 steps with a railing? : A Little 6 Click Score: 20    End of Session Equipment Utilized During Treatment: Back brace Activity Tolerance: Patient tolerated treatment well Patient left: in bed;with call bell/phone within reach (Sitting EOB awaiting OT with needs met.) Nurse Communication: Mobility status PT Visit Diagnosis: Unsteadiness on feet (R26.81);Pain Pain - part of body:  (back)    Time: 0347-4259 PT Time Calculation (min) (ACUTE ONLY): 15 min   Charges:   PT Evaluation $PT Eval Low Complexity: 1 Low  Rolinda Roan, PT, DPT Acute Rehabilitation Services Pager: [phone] Office: [phone]   Thelma Comp 12/21/2021, 9:38 AM

## 2021-12-26 MED FILL — Heparin Sodium (Porcine) Inj 1000 Unit/ML: INTRAMUSCULAR | Qty: 30 | Status: AC

## 2021-12-26 MED FILL — Sodium Chloride IV Soln 0.9%: INTRAVENOUS | Qty: 1000 | Status: AC

## 2022-01-01 ENCOUNTER — Telehealth: Payer: Self-pay | Admitting: Hematology

## 2022-01-01 ENCOUNTER — Other Ambulatory Visit: Payer: Self-pay

## 2022-01-01 NOTE — Telephone Encounter (Signed)
Called pt to r/s per 2/6 inbasket, pt informed me she was recovering from Back surgery and is unable to come in this week anyways. Patient stated she is 5 years out from remission, PT will call to r/s if she feels the need, notified desk nurse.

## 2022-01-04 ENCOUNTER — Inpatient Hospital Stay: Payer: PPO | Admitting: Hematology

## 2022-01-04 ENCOUNTER — Inpatient Hospital Stay: Payer: PPO

## 2022-01-18 DIAGNOSIS — M5440 Lumbago with sciatica, unspecified side: Secondary | ICD-10-CM | POA: Diagnosis not present

## 2022-01-29 DIAGNOSIS — M4316 Spondylolisthesis, lumbar region: Secondary | ICD-10-CM | POA: Diagnosis not present

## 2022-02-06 DIAGNOSIS — M4316 Spondylolisthesis, lumbar region: Secondary | ICD-10-CM | POA: Diagnosis not present

## 2022-02-08 ENCOUNTER — Other Ambulatory Visit: Payer: Self-pay | Admitting: Student

## 2022-02-08 ENCOUNTER — Other Ambulatory Visit (HOSPITAL_COMMUNITY): Payer: Self-pay | Admitting: Student

## 2022-02-08 DIAGNOSIS — M4316 Spondylolisthesis, lumbar region: Secondary | ICD-10-CM | POA: Diagnosis not present

## 2022-02-08 DIAGNOSIS — M546 Pain in thoracic spine: Secondary | ICD-10-CM | POA: Diagnosis not present

## 2022-02-08 DIAGNOSIS — R0781 Pleurodynia: Secondary | ICD-10-CM | POA: Diagnosis not present

## 2022-02-08 DIAGNOSIS — R1010 Upper abdominal pain, unspecified: Secondary | ICD-10-CM | POA: Diagnosis not present

## 2022-02-13 DIAGNOSIS — M4316 Spondylolisthesis, lumbar region: Secondary | ICD-10-CM | POA: Diagnosis not present

## 2022-02-14 ENCOUNTER — Encounter: Payer: Self-pay | Admitting: Nurse Practitioner

## 2022-02-14 ENCOUNTER — Ambulatory Visit
Admission: RE | Admit: 2022-02-14 | Discharge: 2022-02-14 | Disposition: A | Discharge status: Home / Self Care | Payer: PPO | Source: Ambulatory Visit | Attending: Student | Admitting: Student

## 2022-02-14 ENCOUNTER — Other Ambulatory Visit: Payer: Self-pay

## 2022-02-14 DIAGNOSIS — S22080A Wedge compression fracture of T11-T12 vertebra, initial encounter for closed fracture: Secondary | ICD-10-CM | POA: Diagnosis not present

## 2022-02-14 DIAGNOSIS — S22070A Wedge compression fracture of T9-T10 vertebra, initial encounter for closed fracture: Secondary | ICD-10-CM | POA: Diagnosis not present

## 2022-02-14 DIAGNOSIS — M546 Pain in thoracic spine: Secondary | ICD-10-CM

## 2022-02-14 DIAGNOSIS — S22060A Wedge compression fracture of T7-T8 vertebra, initial encounter for closed fracture: Secondary | ICD-10-CM | POA: Diagnosis not present

## 2022-02-14 DIAGNOSIS — S22050A Wedge compression fracture of T5-T6 vertebra, initial encounter for closed fracture: Secondary | ICD-10-CM | POA: Diagnosis not present

## 2022-02-15 DIAGNOSIS — M4316 Spondylolisthesis, lumbar region: Secondary | ICD-10-CM | POA: Diagnosis not present

## 2022-02-19 DIAGNOSIS — I10 Essential (primary) hypertension: Secondary | ICD-10-CM | POA: Diagnosis not present

## 2022-02-19 DIAGNOSIS — M546 Pain in thoracic spine: Secondary | ICD-10-CM | POA: Diagnosis not present

## 2022-02-19 DIAGNOSIS — T23222A Burn of second degree of single left finger (nail) except thumb, initial encounter: Secondary | ICD-10-CM | POA: Diagnosis not present

## 2022-02-19 DIAGNOSIS — E7800 Pure hypercholesterolemia, unspecified: Secondary | ICD-10-CM | POA: Diagnosis not present

## 2022-02-19 DIAGNOSIS — L570 Actinic keratosis: Secondary | ICD-10-CM | POA: Diagnosis not present

## 2022-02-19 DIAGNOSIS — E039 Hypothyroidism, unspecified: Secondary | ICD-10-CM | POA: Diagnosis not present

## 2022-02-20 DIAGNOSIS — M4316 Spondylolisthesis, lumbar region: Secondary | ICD-10-CM | POA: Diagnosis not present

## 2022-02-22 DIAGNOSIS — M4316 Spondylolisthesis, lumbar region: Secondary | ICD-10-CM | POA: Diagnosis not present

## 2022-02-27 DIAGNOSIS — M4316 Spondylolisthesis, lumbar region: Secondary | ICD-10-CM | POA: Diagnosis not present

## 2022-03-01 DIAGNOSIS — M4316 Spondylolisthesis, lumbar region: Secondary | ICD-10-CM | POA: Diagnosis not present

## 2022-03-06 DIAGNOSIS — M4316 Spondylolisthesis, lumbar region: Secondary | ICD-10-CM | POA: Diagnosis not present

## 2022-03-06 DIAGNOSIS — R5383 Other fatigue: Secondary | ICD-10-CM | POA: Diagnosis not present

## 2022-03-06 DIAGNOSIS — M810 Age-related osteoporosis without current pathological fracture: Secondary | ICD-10-CM | POA: Diagnosis not present

## 2022-03-06 DIAGNOSIS — E559 Vitamin D deficiency, unspecified: Secondary | ICD-10-CM | POA: Diagnosis not present

## 2022-03-07 ENCOUNTER — Encounter: Payer: Self-pay | Admitting: Hematology

## 2022-03-07 DIAGNOSIS — Z1231 Encounter for screening mammogram for malignant neoplasm of breast: Secondary | ICD-10-CM | POA: Diagnosis not present

## 2022-03-13 DIAGNOSIS — M4316 Spondylolisthesis, lumbar region: Secondary | ICD-10-CM | POA: Diagnosis not present

## 2022-03-15 DIAGNOSIS — M8008XA Age-related osteoporosis with current pathological fracture, vertebra(e), initial encounter for fracture: Secondary | ICD-10-CM | POA: Diagnosis not present

## 2022-03-15 DIAGNOSIS — M4316 Spondylolisthesis, lumbar region: Secondary | ICD-10-CM | POA: Diagnosis not present

## 2022-03-16 DIAGNOSIS — Z981 Arthrodesis status: Secondary | ICD-10-CM | POA: Diagnosis not present

## 2022-03-16 DIAGNOSIS — S32000A Wedge compression fracture of unspecified lumbar vertebra, initial encounter for closed fracture: Secondary | ICD-10-CM | POA: Diagnosis not present

## 2022-03-21 ENCOUNTER — Telehealth: Payer: Self-pay | Admitting: Hematology

## 2022-03-21 NOTE — Telephone Encounter (Signed)
Called pt to sch appt per referral from Dr. Laurann Montana. Pt did not answer. Left msg for pt to call back to sch appt.  ?

## 2022-03-21 NOTE — Telephone Encounter (Signed)
Scheduled appt per referral from Dr. Laurann Montana. Pt is aware of appt date and time. Pt is aware to arrive 20 mins prior to appt time.  ?

## 2022-03-22 ENCOUNTER — Inpatient Hospital Stay: Payer: PPO | Attending: Hematology | Admitting: Hematology

## 2022-03-22 ENCOUNTER — Encounter: Payer: Self-pay | Admitting: Hematology

## 2022-03-22 ENCOUNTER — Inpatient Hospital Stay: Payer: PPO

## 2022-03-22 VITALS — BP 167/85 | HR 81 | Temp 98.1°F | Resp 17 | Wt 166.2 lb

## 2022-03-22 DIAGNOSIS — T148XXA Other injury of unspecified body region, initial encounter: Secondary | ICD-10-CM

## 2022-03-22 DIAGNOSIS — Z170 Estrogen receptor positive status [ER+]: Secondary | ICD-10-CM | POA: Diagnosis not present

## 2022-03-22 DIAGNOSIS — M797 Fibromyalgia: Secondary | ICD-10-CM | POA: Insufficient documentation

## 2022-03-22 DIAGNOSIS — K219 Gastro-esophageal reflux disease without esophagitis: Secondary | ICD-10-CM | POA: Insufficient documentation

## 2022-03-22 DIAGNOSIS — I10 Essential (primary) hypertension: Secondary | ICD-10-CM | POA: Diagnosis not present

## 2022-03-22 DIAGNOSIS — Z79899 Other long term (current) drug therapy: Secondary | ICD-10-CM | POA: Insufficient documentation

## 2022-03-22 DIAGNOSIS — E039 Hypothyroidism, unspecified: Secondary | ICD-10-CM | POA: Diagnosis not present

## 2022-03-22 DIAGNOSIS — Z791 Long term (current) use of non-steroidal anti-inflammatories (NSAID): Secondary | ICD-10-CM | POA: Diagnosis not present

## 2022-03-22 DIAGNOSIS — Z923 Personal history of irradiation: Secondary | ICD-10-CM | POA: Insufficient documentation

## 2022-03-22 DIAGNOSIS — C50412 Malignant neoplasm of upper-outer quadrant of left female breast: Secondary | ICD-10-CM | POA: Insufficient documentation

## 2022-03-22 DIAGNOSIS — R001 Bradycardia, unspecified: Secondary | ICD-10-CM | POA: Insufficient documentation

## 2022-03-22 DIAGNOSIS — M4316 Spondylolisthesis, lumbar region: Secondary | ICD-10-CM | POA: Diagnosis not present

## 2022-03-22 LAB — CBC WITH DIFFERENTIAL (CANCER CENTER ONLY)
Abs Immature Granulocytes: 0.01 10*3/uL (ref 0.00–0.07)
Basophils Absolute: 0 10*3/uL (ref 0.0–0.1)
Basophils Relative: 0 %
Eosinophils Absolute: 0.1 10*3/uL (ref 0.0–0.5)
Eosinophils Relative: 1 %
HCT: 38.4 % (ref 36.0–46.0)
Hemoglobin: 13.2 g/dL (ref 12.0–15.0)
Immature Granulocytes: 0 %
Lymphocytes Relative: 28 %
Lymphs Abs: 1.8 10*3/uL (ref 0.7–4.0)
MCH: 29.3 pg (ref 26.0–34.0)
MCHC: 34.4 g/dL (ref 30.0–36.0)
MCV: 85.3 fL (ref 80.0–100.0)
Monocytes Absolute: 0.5 10*3/uL (ref 0.1–1.0)
Monocytes Relative: 7 %
Neutro Abs: 4.1 10*3/uL (ref 1.7–7.7)
Neutrophils Relative %: 64 %
Platelet Count: 324 10*3/uL (ref 150–400)
RBC: 4.5 MIL/uL (ref 3.87–5.11)
RDW: 13.9 % (ref 11.5–15.5)
WBC Count: 6.5 10*3/uL (ref 4.0–10.5)
nRBC: 0 % (ref 0.0–0.2)

## 2022-03-22 LAB — CMP (CANCER CENTER ONLY)
ALT: 27 U/L (ref 0–44)
AST: 35 U/L (ref 15–41)
Albumin: 4.2 g/dL (ref 3.5–5.0)
Alkaline Phosphatase: 98 U/L (ref 38–126)
Anion gap: 10 (ref 5–15)
BUN: 13 mg/dL (ref 8–23)
CO2: 29 mmol/L (ref 22–32)
Calcium: 9.6 mg/dL (ref 8.9–10.3)
Chloride: 96 mmol/L — ABNORMAL LOW (ref 98–111)
Creatinine: 0.93 mg/dL (ref 0.44–1.00)
GFR, Estimated: >60 mL/min (ref >60)
Glucose, Bld: 119 mg/dL — ABNORMAL HIGH (ref 70–99)
Potassium: 3.3 mmol/L — ABNORMAL LOW (ref 3.5–5.1)
Sodium: 135 mmol/L (ref 135–145)
Total Bilirubin: 0.6 mg/dL (ref 0.3–1.2)
Total Protein: 7.2 g/dL (ref 6.5–8.1)

## 2022-03-22 NOTE — Progress Notes (Signed)
?[place]   ?[phone] [phone]   ?Clinic Follow up Note  ? ?Patient Care Team: ?Mckinley Jewel, MD as PCP - General (Internal Medicine) ?Sueanne Margarita, MD as Consulting Physician (Cardiology) ?Jodi Marble, MD as Consulting Physician (Otolaryngology) ?[name], MD as Consulting Physician (General Surgery) ?Truitt Merle, MD as Consulting Physician (Hematology) ?Kyung Rudd, MD as Consulting Physician (Radiation Oncology) ?Sylvan Cheese, NP as Nurse Practitioner (Hematology and Oncology) ?Aloha Gell, MD as Consulting Physician (Obstetrics and Gynecology) ? ?Date of Service:  03/22/2022 ? ?CHIEF COMPLAINT: multiple compression fractures, h/o breast cancer ? ?CURRENT THERAPY:  ?Surveillance ? ?ASSESSMENT & PLAN:  ?MARTHENA WHITMYER is a 81 y.o. post-menopausal female with  ? ?1. Vertebral Compression Fractures, Osteopenia ?-She reports having low bone density since her 30s ?-DEXA from 09/01/19 showing osteopenia with lowest T score -2.10 in left femur neck and FRAX score of major osteoporotic fracture of 17% and a hip fracture is 3.5% ?-DEXA from 01/12/21 shows improvement in the left femur neck to T score -1.4 but worsening bone density in the right femur neck now -2.0.  Her FRAX risks remain essentially the same with a 13% risk of major osteoporotic fracture and a 3.3% risk of hip fracture ?-she has a history of several back surgeries, the first being 30+ years ago for a ruptured cyst. She has four procedures documented in Epic, including use of bone cement and pedicle screws. ?-she presented to her orthopedist with bilateral rib pain. Thoracic spine CT on 02/14/22 showed: new compression fractures at T10 (50%) and T12 (10%); chronic compression fractures at T3, T4, T8, T12, and L1. ?-she was also found to have a slightly abnormal SPEP on lab work by her endocrinologist  ?-Due to her multiple fractures, which is out of proportion of her bone density, I  recommend further work-up to rule out multiple myeloma and metastatic cancer, due to her history of breast cancer.  I will order PET scan to rule out metastasis and multiple myeloma panel labs to be done today. ? ?2. 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  Oncotype RS of 7, low risk.  She declined antiestrogen therapy and continues surveillance ?-Genetic testing was negative ?-most recent mamm[facility] on 03/07/22 at [facility] was benign. ?-Continue surveillance ?  ?3. Arthritis, fibromyalgia, and body pain ?-She will continue follow-up with her primary care physician, and exercise ?-Recent MRI showed OA ?  ? ?Plan: ?-proceed to lab today including MM lab work  ?-PET scan to be done in the next few weeks ?-f/u after PET scan ? ? ?No problem-specific Assessment & Plan notes found for this encounter. ? ? ?SUMMARY OF ONCOLOGIC HISTORY: ?Oncology History Overview Note  ?Breast cancer of upper-outer quadrant of left female breast ([place]) ?  Staging form: Breast, AJCC 7th Edition ?    Pathologic stage from 11/29/2015: Stage IA (T1c, N0, cM0) - Signed by Truitt Merle, MD on 01/14/2016 ?    Clinical: Stage IA (T1c, N0, M0) - Unsigned ? ? ? ? ?  ?Breast cancer of upper-outer quadrant of left female breast ([place])  ?07/18/2014 Procedure  ? Hereditary cancer panel: no deleterious mutation at ATM, BARD1, BRCA1, BRCA2, BRIP1, CDH1, CHEK2, EPCAM, MLH1, MSH2, MSH6, NBN, PALB2, PMS2, PTEN, RAD51C, RAD51D, STK11, and TP53. ? ?  ?10/19/2015 Mammogram  ? 1.3 cm irregular focal asymmetry in the left breast ? ?  ?  10/25/2015 Initial Biopsy  ? Left breast core needle biopsy showed atypical ductal Hyperplasia ? ?  ?12/01/2015 Definitive Surgery  ? Left breast lumpectomy showed invasive ductal carcinoma, 1.2 cm, grade 2, and DCIS with necrosis and calcification, margins were negative.ER+ (90%), PR+ (20%), HER2/neu  negative, Ki67 5% ? ?  ?12/01/2015 Pathologic Stage  ? Stage IA: pT1c pN0 ? ?  ?12/01/2015 Oncotype testing  ? RS 7, which predicts 10- year distant recurrence risk of 6% with tamoxifen alone. ? ?  ?12/27/2015 Pathology Results  ? Second surgery with sentinel lymph node biopsy showed no malignant cells in 5 lymph nodes ? ?  ?01/23/2016 - 02/17/2016 Radiation Therapy  ? Adjuvant left Breast radiation: Left breast treated to 42.5 Gy in 17 fractions, Left breast boost treated to 7.5 Gy in 3 fractions  ? ?  ? Anti-estrogen oral therapy  ? She declined adjuvant AI or tamoxifen  ?  ?05/03/2016 Survivorship  ? SCP visit completed ? ?  ?10/31/2017 Mammogram  ? IMPRESSION: ?There is no mammographic evidence of malignancy.  ? ?  ? ? ? ?INTERVAL HISTORY:  ?LENNAN MALONE is here for a follow up of compression fractures. She was last seen by NP Lacie on 02/01/21. She presents to the clinic alone. ?She reports bilateral rib pain that prompted her to reach out to her orthopedist. She notes some tenderness to her mid back. ?  ?All other systems were reviewed with the patient and are negative. ? ?MEDICAL HISTORY:  ?Past Medical History:  ?Diagnosis Date  ? Allergy   ? Arthritis   ? Bladder infection   ? hx of last one approx 5 years ago   ? Breast cancer ([place]) 10/25/15  ? left breast  ADH   ? Cancer [facility])   ? breast  ? Complication of anesthesia   ? difficulty awakening   ? Diastolic dysfunction   ? Dyspnea   ? due to pain  ? Fibromyalgia   ? GERD (gastroesophageal reflux disease)   ? Tums if needed  ? 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 Hypertension   ? Hypothyroidism   ? Obesity   ? PONV (postoperative nausea and vomiting)   ? Reflux   ? buring and dysphagia sx resp to PPI; egd 3/06 neg for Barretts  ? Tachycardia    ? 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 Yeast infection   ? hx of   ? ? ?SURGICAL HISTORY: ?Past Surgical History:  ?Procedure Laterality Date  ? AXILLARY LYMPH NODE  BIOPSY Left 12/27/2015  ? Procedure: LEFT AXILLARY SENTINEL LYMPH NODE BIOPSY;  Surgeon: [name], MD;  Location: [place];  Service: General;  Laterality: Left;  ? BELPHAROPTOSIS REPAIR Bilateral   ? BREAST LUMPECTOMY WITH RADIOACTIVE SEED LOCALIZATION Left 12/01/2015  ? Procedure: LEFT BREAST [place] RADIOACTIVE SEED LOCALIZATION;  Surgeon: [name], MD;  Location: [place];  Service: General;  Laterality: Left;  ? BREAST SURGERY    ? CATARACT EXTRACTION, BILATERAL Bilateral 08/2018  ? COLONOSCOPY  01/2005  ? NEG  ? cortisone injections    ? right arm; back  ? EYE SURGERY    ? cosmetic; eyelid surgery bilat   ? FINGER SURGERY Left   ? left index finger/ cyst 02/2007  ? KNEE ARTHROSCOPY Right 04/13/2015  ? Procedure: ARTHROSCOPY RIGHT KNEE WITH MEDIAL MENISCAL DEBRIDEMENT AND CHONDROPLASTY;  Surgeon: Gaynelle Arabian, MD;  Location: [facility];  Service: Orthopedics;  Laterality: Right;  ?  KYPHOPLASTY N/A 03/16/2020  ? Procedure: KYPHOPLASTY THORACIC THREE, THORACIC FOUR, THORACIC SIX;  Surgeon: Jovita Gamma, MD;  Location: [place];  Service: Neurosurgery;  Laterality: N/A;  ? LAMINECTOMY WITH POSTERIOR LATERAL ARTHRODESIS LEVEL 4 N/A 12/20/2021  ? Procedure: Thoracic Ten-Eleven, Thoracic Eleven-Twelve, Thoracic Twelve-Lumbar One, Lumbar One-Two Posterior Lateral Fusion;  Surgeon: Kary Kos, MD;  Location: [place];  Service: Neurosurgery;  Laterality: N/A;  ? left rotator cuff repair Left   ? 12/2006  ? LUMBAR FUSION    ? x3  ? LUMBAR LAMINECTOMY    ? TONSILLECTOMY    ? ? ?I have reviewed the social history and family history with the patient and they are unchanged from previous note. ? ?ALLERGIES:  is allergic to contrast media [iodinated contrast media], gadobenate, other, cymbalta [duloxetine hcl], amoxicillin, and macrodantin [nitrofurantoin]. ? ?MEDICATIONS:  ?Current Outpatient Medications  ?Medication Sig Dispense Refill  ? acetaminophen (TYLENOL) 500 MG tablet Take 1,000 mg by mouth every 8  (eight) hours as needed for mild pain.    ? Calcium-Vitamin D-Vitamin K (CALCIUM + D) [phone] MG-UNT-MCG CHEW Chew 1 tablet by mouth daily.    ? cholecalciferol (VITAMIN D) 25 MCG (1000 UNIT) tablet Ta

## 2022-03-23 LAB — KAPPA/LAMBDA LIGHT CHAINS
Kappa free light chain: 24.4 mg/L — ABNORMAL HIGH (ref 3.3–19.4)
Kappa, lambda light chain ratio: 2 — ABNORMAL HIGH (ref 0.26–1.65)
Lambda free light chains: 12.2 mg/L (ref 5.7–26.3)

## 2022-03-26 LAB — MULTIPLE MYELOMA PANEL, SERUM
Albumin SerPl Elph-Mcnc: 3.7 g/dL (ref 2.9–4.4)
Albumin/Glob SerPl: 1.3 (ref 0.7–1.7)
Alpha 1: 0.3 g/dL (ref 0.0–0.4)
Alpha2 Glob SerPl Elph-Mcnc: 0.8 g/dL (ref 0.4–1.0)
B-Globulin SerPl Elph-Mcnc: 1 g/dL (ref 0.7–1.3)
Gamma Glob SerPl Elph-Mcnc: 0.8 g/dL (ref 0.4–1.8)
Globulin, Total: 2.9 g/dL (ref 2.2–3.9)
IgA: 226 mg/dL (ref 64–422)
IgG (Immunoglobin G), Serum: 775 mg/dL (ref 586–1602)
IgM (Immunoglobulin M), Srm: 115 mg/dL (ref 26–217)
Total Protein ELP: 6.6 g/dL (ref 6.0–8.5)

## 2022-03-28 ENCOUNTER — Telehealth: Payer: Self-pay | Admitting: Hematology

## 2022-03-28 NOTE — Telephone Encounter (Signed)
Scheduled follow-up appointment per 4/27 los. Patient is aware. 

## 2022-03-29 DIAGNOSIS — M4316 Spondylolisthesis, lumbar region: Secondary | ICD-10-CM | POA: Diagnosis not present

## 2022-04-04 ENCOUNTER — Ambulatory Visit (HOSPITAL_COMMUNITY)
Admission: RE | Admit: 2022-04-04 | Discharge: 2022-04-04 | Disposition: A | Discharge status: Home / Self Care | Payer: PPO | Source: Ambulatory Visit | Attending: Hematology | Admitting: Hematology

## 2022-04-04 DIAGNOSIS — C50412 Malignant neoplasm of upper-outer quadrant of left female breast: Secondary | ICD-10-CM | POA: Diagnosis not present

## 2022-04-04 DIAGNOSIS — T148XXA Other injury of unspecified body region, initial encounter: Secondary | ICD-10-CM | POA: Insufficient documentation

## 2022-04-04 DIAGNOSIS — Z170 Estrogen receptor positive status [ER+]: Secondary | ICD-10-CM | POA: Diagnosis not present

## 2022-04-04 DIAGNOSIS — C50919 Malignant neoplasm of unspecified site of unspecified female breast: Secondary | ICD-10-CM | POA: Diagnosis not present

## 2022-04-04 LAB — GLUCOSE, CAPILLARY: Glucose-Capillary: 111 mg/dL — ABNORMAL HIGH (ref 70–99)

## 2022-04-04 MED ORDER — FLUDEOXYGLUCOSE F - 18 (FDG) INJECTION
8.2000 | Freq: Once | INTRAVENOUS | Status: AC | PRN
  Administered 2022-04-04: 8.2 via INTRAVENOUS

## 2022-04-05 DIAGNOSIS — M8008XD Age-related osteoporosis with current pathological fracture, vertebra(e), subsequent encounter for fracture with routine healing: Secondary | ICD-10-CM | POA: Diagnosis not present

## 2022-04-10 ENCOUNTER — Encounter: Payer: Self-pay | Admitting: Hematology

## 2022-04-10 ENCOUNTER — Other Ambulatory Visit: Payer: Self-pay

## 2022-04-10 ENCOUNTER — Inpatient Hospital Stay: Payer: PPO | Attending: Hematology | Admitting: Hematology

## 2022-04-10 VITALS — BP 140/68 | HR 67 | Temp 98.1°F | Resp 18 | Ht <= 58 in | Wt 165.6 lb

## 2022-04-10 DIAGNOSIS — Z791 Long term (current) use of non-steroidal anti-inflammatories (NSAID): Secondary | ICD-10-CM | POA: Diagnosis not present

## 2022-04-10 DIAGNOSIS — K219 Gastro-esophageal reflux disease without esophagitis: Secondary | ICD-10-CM | POA: Diagnosis not present

## 2022-04-10 DIAGNOSIS — M810 Age-related osteoporosis without current pathological fracture: Secondary | ICD-10-CM | POA: Insufficient documentation

## 2022-04-10 DIAGNOSIS — Z170 Estrogen receptor positive status [ER+]: Secondary | ICD-10-CM | POA: Diagnosis not present

## 2022-04-10 DIAGNOSIS — Z79811 Long term (current) use of aromatase inhibitors: Secondary | ICD-10-CM | POA: Diagnosis not present

## 2022-04-10 DIAGNOSIS — E039 Hypothyroidism, unspecified: Secondary | ICD-10-CM | POA: Diagnosis not present

## 2022-04-10 DIAGNOSIS — M129 Arthropathy, unspecified: Secondary | ICD-10-CM | POA: Insufficient documentation

## 2022-04-10 DIAGNOSIS — C50412 Malignant neoplasm of upper-outer quadrant of left female breast: Secondary | ICD-10-CM | POA: Diagnosis not present

## 2022-04-10 DIAGNOSIS — Z79899 Other long term (current) drug therapy: Secondary | ICD-10-CM | POA: Insufficient documentation

## 2022-04-10 DIAGNOSIS — M797 Fibromyalgia: Secondary | ICD-10-CM | POA: Diagnosis not present

## 2022-04-10 DIAGNOSIS — R0781 Pleurodynia: Secondary | ICD-10-CM | POA: Diagnosis not present

## 2022-04-10 DIAGNOSIS — Z923 Personal history of irradiation: Secondary | ICD-10-CM | POA: Insufficient documentation

## 2022-04-10 DIAGNOSIS — C7951 Secondary malignant neoplasm of bone: Secondary | ICD-10-CM | POA: Diagnosis not present

## 2022-04-10 DIAGNOSIS — I10 Essential (primary) hypertension: Secondary | ICD-10-CM | POA: Insufficient documentation

## 2022-04-10 NOTE — Progress Notes (Addendum)
?[place]   ?[phone] [phone]   ?Clinic Follow up Note  ? ?Patient Care Team: ?Mckinley Jewel, MD as PCP - General (Internal Medicine) ?Sueanne Margarita, MD as Consulting Physician (Cardiology) ?Jodi Marble, MD as Consulting Physician (Otolaryngology) ?[name], MD as Consulting Physician (General Surgery) ?Truitt Merle, MD as Consulting Physician (Hematology) ?Kyung Rudd, MD as Consulting Physician (Radiation Oncology) ?Sylvan Cheese, NP as Nurse Practitioner (Hematology and Oncology) ?Aloha Gell, MD as Consulting Physician (Obstetrics and Gynecology) ? ?Date of Service:  04/10/2022 ? ?CHIEF COMPLAINT: f/u of multiple compression fractures, h/o breast cancer ? ?CURRENT THERAPY:  ?Surveillance ? ?ASSESSMENT & PLAN:  ?Audrey [name] is a 81 y.o. female with  ? ?1. Vertebral Compression Fractures, Osteoporosis  ?-She reports having low bone density since her 30s ?-DEXA from 01/12/21 showed improvement in the left femur neck to T score -1.4 but worsening bone density in the right femur neck now -2.0.  Her FRAX risks remain essentially the same with a 13% risk of major osteoporotic fracture and a 3.3% risk of hip fracture ?-she has a history of several back surgeries, the first being 30+ years ago for a ruptured cyst. She has four procedures documented in Epic, including use of bone cement and pedicle screws. ?-she presented to her orthopedist with bilateral rib pain. Thoracic spine CT on 02/14/22 showed: new compression fractures at T10 (50%) and T12 (10%); chronic compression fractures at T3, T4, T8, T12, and L1. ?-she was also found to have a slightly abnormal SPEP on lab work by her endocrinologist  ?-multiple myeloma panel obtained on 03/22/22 was WNL. Light chains showed a slightly elevated kappa free light chain. ?-PET scan on 04/04/22 showed no findings to suggest metastatic disease, especially bone mets. A 2.5 cm mildly hypermetabolic left chest  wall lesion was noted and felt to represent chronic scar/granulation tissue which is corresponding to the surgical scar in left breast on exam; recent mammogram on 03/07/22 was benign. ?-I reviewed her lab work and PET scan results with her today.  Her SPEP was negative for M protein, immunofixation was negative, also her kappa light chain was slightly elevated, given the negative immunofixation, this is likely nonspecific.  There is no clinical concern for metastatic disease or myeloma. ?-Continue osteoporosis treatment per her endocrinologist. ?-f/u in 6 months ?  ?2. 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  Oncotype RS of 7, low risk.  She declined antiestrogen therapy and continues surveillance ?-Genetic testing was negative ?-most recent mammogram on 03/07/22 at [facility] was benign. ?-Continue surveillance ?  ?3. Arthritis, fibromyalgia, and right lateral rib pain ?-She will continue follow-up with her primary care physician, and exercise ?-She is most bothered by her left side rib pain, recent x-ray and PET scan were negative for rib fracture or bone lesions, exam was unremarkable ?-She will continue follow-up with her orthopedic surgeon ?  ?  ?Plan: ?-Recent labs and PET scan results reviewed with patient, no evidence for metastatic cancer or multiple myeloma  ?-lab and f/u in 6 months ?-She will follow-up with her PCP, orthopedic surgeon, and endocrinologist Dr. Layne Benton  ? ? ?No problem-specific Assessment & Plan notes found for this encounter. ? ? ?SUMMARY OF ONCOLOGIC HISTORY: ?Oncology History Overview Note  ?Breast cancer of upper-outer quadrant of left female breast ([place]) ?  Staging form: Breast, AJCC 7th Edition ?  Pathologic stage from 11/29/2015: Stage IA (T1c, N0, cM0) - Signed by Truitt Merle, MD on 01/14/2016 ?    Clinical: Stage IA (T1c, N0, M0) -  Unsigned ? ? ? ? ?  ?Breast cancer of upper-outer quadrant of left female breast ([place])  ?07/18/2014 Procedure  ? Hereditary cancer panel: no deleterious mutation at ATM, BARD1, BRCA1, BRCA2, BRIP1, CDH1, CHEK2, EPCAM, MLH1, MSH2, MSH6, NBN, PALB2, PMS2, PTEN, RAD51C, RAD51D, STK11, and TP53. ? ?  ?10/19/2015 Mammogram  ? 1.3 cm irregular focal asymmetry in the left breast ? ?  ?10/25/2015 Initial Biopsy  ? Left breast core needle biopsy showed atypical ductal Hyperplasia ? ?  ?12/01/2015 Definitive Surgery  ? Left breast lumpectomy showed invasive ductal carcinoma, 1.2 cm, grade 2, and DCIS with necrosis and calcification, margins were negative.ER+ (90%), PR+ (20%), HER2/neu negative, Ki67 5% ? ?  ?12/01/2015 Pathologic Stage  ? Stage IA: pT1c pN0 ? ?  ?12/01/2015 Oncotype testing  ? RS 7, which predicts 10- year distant recurrence risk of 6% with tamoxifen alone. ? ?  ?12/27/2015 Pathology Results  ? Second surgery with sentinel lymph node biopsy showed no malignant cells in 5 lymph nodes ? ?  ?01/23/2016 - 02/17/2016 Radiation Therapy  ? Adjuvant left Breast radiation: Left breast treated to 42.5 Gy in 17 fractions, Left breast boost treated to 7.5 Gy in 3 fractions  ? ?  ? Anti-estrogen oral therapy  ? She declined adjuvant AI or tamoxifen  ?  ?05/03/2016 Survivorship  ? SCP visit completed ? ?  ?10/31/2017 Mammogram  ? IMPRESSION: ?There is no mammographic evidence of malignancy.  ? ?  ? ? ? ?INTERVAL HISTORY:  ?Audrey [name] is here for a follow up of compression fractures. She was last seen by me on 03/22/22. She presents to the clinic alone. ?She reports she is doing well overall except for continued rib pain. She again explains that this has been ongoing since her back surgery. ?  ?All other systems were reviewed with the patient and are negative. ? ?MEDICAL HISTORY:  ?Past Medical History:  ?Diagnosis Date  ? Allergy   ? Arthritis   ? Bladder infection   ? hx of last one approx 5 years ago   ? Breast cancer ([place])  10/25/15  ? left breast  ADH   ? Cancer [facility])   ? breast  ? Complication of anesthesia   ? difficulty awakening   ? Diastolic dysfunction   ? Dyspnea   ? due to pain  ? Fibromyalgia   ? GERD (gastroesophageal reflux disease)   ? Tums if needed  ? 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 Hypertension   ? Hypothyroidism   ? Obesity   ? PONV (postoperative nausea and vomiting)   ? Reflux   ? buring and dysphagia sx resp to PPI; egd 3/06 neg for Barretts  ? Tachycardia    ? 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 Yeast infection   ? hx of   ? ? ?SURGICAL HISTORY: ?Past Surgical History:  ?Procedure Laterality Date  ? AXILLARY LYMPH NODE BIOPSY Left 12/27/2015  ? Procedure: LEFT AXILLARY SENTINEL LYMPH NODE BIOPSY;  Surgeon: [name], MD;  Location: [place];  Service: General;  Laterality: Left;  ? BELPHAROPTOSIS REPAIR Bilateral   ? BREAST LUMPECTOMY WITH RADIOACTIVE SEED LOCALIZATION Left 12/01/2015  ? Procedure: LEFT BREAST [place] RADIOACTIVE SEED LOCALIZATION;  Surgeon: [name], MD;  Location: [place];  Service: General;  Laterality: Left;  ? BREAST SURGERY    ? CATARACT EXTRACTION, BILATERAL Bilateral 08/2018  ? COLONOSCOPY  01/2005  ? NEG  ? cortisone injections    ? right arm; back  ? EYE SURGERY    ? cosmetic; eyelid surgery bilat   ? FINGER SURGERY Left   ? left index finger/ cyst 02/2007  ? KNEE ARTHROSCOPY Right 04/13/2015  ? Procedure: ARTHROSCOPY RIGHT KNEE WITH MEDIAL MENISCAL DEBRIDEMENT AND CHONDROPLASTY;  Surgeon: Gaynelle Arabian, MD;  Location: [facility];  Service: Orthopedics;  Laterality: Right;  ? KYPHOPLASTY N/A 03/16/2020  ? Procedure: KYPHOPLASTY THORACIC THREE, THORACIC FOUR, THORACIC SIX;  Surgeon: Jovita Gamma, MD;  Location: [place];  Service: Neurosurgery;  Laterality: N/A;  ? LAMINECTOMY WITH POSTERIOR LATERAL ARTHRODESIS LEVEL 4 N/A 12/20/2021  ? Procedure: Thoracic Ten-Eleven, Thoracic Eleven-Twelve, Thoracic  Twelve-Lumbar One, Lumbar One-Two Posterior Lateral Fusion;  Surgeon: Kary Kos, MD;  Location: [place];  Service: Neurosurgery;  Laterality: N/A;  ? left rotator cuff repair Left   ? 12/2006  ? LUMBAR FUSION    ? x3  ? LUMB

## 2022-04-11 ENCOUNTER — Telehealth: Payer: Self-pay

## 2022-04-11 NOTE — Telephone Encounter (Signed)
Faxed Dr. Ernestina Penna last office note to the following providers per Dr. Ernestina Penna request: ? ?Dr. Verner Chol [phone]  [phone] ?Dr. Kelton Pillar [phone]  [phone] ? ?Fax confirmation confirmed in Epic. ? ? ?(Faxed through Epic routing). ?

## 2022-04-17 ENCOUNTER — Other Ambulatory Visit (HOSPITAL_COMMUNITY): Payer: Self-pay | Admitting: Neurosurgery

## 2022-04-17 DIAGNOSIS — Z6833 Body mass index (BMI) 33.0-33.9, adult: Secondary | ICD-10-CM | POA: Diagnosis not present

## 2022-04-17 DIAGNOSIS — S22000A Wedge compression fracture of unspecified thoracic vertebra, initial encounter for closed fracture: Secondary | ICD-10-CM

## 2022-04-19 ENCOUNTER — Ambulatory Visit (HOSPITAL_COMMUNITY): Payer: PPO

## 2022-04-19 ENCOUNTER — Ambulatory Visit (HOSPITAL_COMMUNITY): Admission: RE | Admit: 2022-04-19 | Payer: PPO | Source: Ambulatory Visit

## 2022-04-19 DIAGNOSIS — G588 Other specified mononeuropathies: Secondary | ICD-10-CM | POA: Diagnosis not present

## 2022-04-21 ENCOUNTER — Ambulatory Visit (HOSPITAL_COMMUNITY): Payer: PPO

## 2022-04-21 ENCOUNTER — Encounter (HOSPITAL_COMMUNITY): Payer: Self-pay

## 2022-05-09 ENCOUNTER — Other Ambulatory Visit: Payer: Self-pay | Admitting: Physician Assistant

## 2022-05-09 DIAGNOSIS — M810 Age-related osteoporosis without current pathological fracture: Secondary | ICD-10-CM | POA: Diagnosis not present

## 2022-05-09 DIAGNOSIS — R079 Chest pain, unspecified: Secondary | ICD-10-CM | POA: Diagnosis not present

## 2022-05-09 DIAGNOSIS — M4850XD Collapsed vertebra, not elsewhere classified, site unspecified, subsequent encounter for fracture with routine healing: Secondary | ICD-10-CM | POA: Diagnosis not present

## 2022-05-09 DIAGNOSIS — Z853 Personal history of malignant neoplasm of breast: Secondary | ICD-10-CM | POA: Diagnosis not present

## 2022-05-09 DIAGNOSIS — R109 Unspecified abdominal pain: Secondary | ICD-10-CM | POA: Diagnosis not present

## 2022-05-09 DIAGNOSIS — I10 Essential (primary) hypertension: Secondary | ICD-10-CM | POA: Diagnosis not present

## 2022-05-11 ENCOUNTER — Other Ambulatory Visit (HOSPITAL_COMMUNITY): Payer: Self-pay | Admitting: Physician Assistant

## 2022-05-11 DIAGNOSIS — R079 Chest pain, unspecified: Secondary | ICD-10-CM

## 2022-05-11 DIAGNOSIS — R109 Unspecified abdominal pain: Secondary | ICD-10-CM

## 2022-05-14 ENCOUNTER — Other Ambulatory Visit: Payer: PPO

## 2022-05-15 ENCOUNTER — Encounter (HOSPITAL_COMMUNITY): Payer: Self-pay

## 2022-05-15 ENCOUNTER — Ambulatory Visit (HOSPITAL_COMMUNITY)
Admission: RE | Admit: 2022-05-15 | Discharge: 2022-05-15 | Disposition: A | Discharge status: Home / Self Care | Payer: PPO | Source: Ambulatory Visit | Attending: Physician Assistant | Admitting: Physician Assistant

## 2022-05-15 DIAGNOSIS — S2232XA Fracture of one rib, left side, initial encounter for closed fracture: Secondary | ICD-10-CM | POA: Diagnosis not present

## 2022-05-15 DIAGNOSIS — R079 Chest pain, unspecified: Secondary | ICD-10-CM | POA: Diagnosis not present

## 2022-05-15 DIAGNOSIS — K5730 Diverticulosis of large intestine without perforation or abscess without bleeding: Secondary | ICD-10-CM | POA: Diagnosis not present

## 2022-05-15 DIAGNOSIS — K7689 Other specified diseases of liver: Secondary | ICD-10-CM | POA: Diagnosis not present

## 2022-05-15 DIAGNOSIS — R109 Unspecified abdominal pain: Secondary | ICD-10-CM | POA: Insufficient documentation

## 2022-05-15 DIAGNOSIS — N289 Disorder of kidney and ureter, unspecified: Secondary | ICD-10-CM | POA: Diagnosis not present

## 2022-05-15 MED ORDER — SODIUM CHLORIDE (PF) 0.9 % IJ SOLN
INTRAMUSCULAR | Status: AC
  Filled 2022-05-15: qty 50

## 2022-05-15 MED ORDER — IOHEXOL 300 MG/ML  SOLN
100.0000 mL | Freq: Once | INTRAMUSCULAR | Status: AC | PRN
  Administered 2022-05-15: 100 mL via INTRAVENOUS

## 2022-05-22 DIAGNOSIS — Z6832 Body mass index (BMI) 32.0-32.9, adult: Secondary | ICD-10-CM | POA: Diagnosis not present

## 2022-05-22 DIAGNOSIS — S22000A Wedge compression fracture of unspecified thoracic vertebra, initial encounter for closed fracture: Secondary | ICD-10-CM | POA: Diagnosis not present

## 2022-05-25 ENCOUNTER — Other Ambulatory Visit: Payer: Self-pay | Admitting: Neurosurgery

## 2022-06-05 NOTE — Pre-Procedure Instructions (Signed)
Surgical Instructions    Your procedure is scheduled on June 13, 2022.  Report to [facility] Main Entrance "A" at 12:00 P.M., then check in with the Admitting office.  Call this number if you have problems the morning of surgery:  [phone]   If you have any questions prior to your surgery date call [phone]: Open Monday-Friday 8am-4pm    Remember:  Do not eat after midnight the night before your surgery  You may drink clear liquids until 11:00 AM the morning of your surgery.   Clear liquids allowed are: Water, Non-Citrus Juices (without pulp), Carbonated Beverages, Clear Tea, Black Coffee Only (NO MILK, CREAM OR POWDERED CREAMER of any kind), and Gatorade.    Take these medicines the morning of surgery with A SIP OF WATER:  famotidine (PEPCID)  gabapentin (NEURONTIN)  levothyroxine (SYNTHROID, LEVOTHROID)  nebivolol (BYSTOLIC)   Polyethyl Glyc-Propyl Glyc PF (SYSTANE ULTRA PF)   Take these medicines the morning of surgery AS NEEDED:  acetaminophen (TYLENOL)   loratadine (CLARITIN)   methocarbamol (ROBAXIN)    As of today, STOP taking any Aspirin (unless otherwise instructed by your surgeon) Aleve, Naproxen, Ibuprofen, Motrin, Advil, Goody's, BC's, all herbal medications, fish oil, and all vitamins.                     Do NOT Smoke (Tobacco/Vaping) for 24 hours prior to your procedure.  If you use a CPAP at night, you may bring your mask/headgear for your overnight stay.   Contacts, glasses, piercing's, hearing aid's, dentures or partials may not be worn into surgery, please bring cases for these belongings.    For patients admitted to the hospital, discharge time will be determined by your treatment team.   Patients discharged the day of surgery will not be allowed to drive home, and someone needs to stay with them for 24 hours.  SURGICAL WAITING ROOM VISITATION Patients having surgery or a procedure may have two support people in the waiting room. These visitors  may be switched out with other visitors if needed. Children under the age of 23 must have an adult accompany them who is not the patient. If the patient needs to stay at the hospital during part of their recovery, the visitor guidelines for inpatient rooms apply.  Please refer to the [facility] website for the visitor guidelines for Inpatients (after your surgery is over and you are in a regular room).    Special instructions:   [facility]- Preparing For Surgery  Before surgery, you can play an important role. Because skin is not sterile, your skin needs to be as free of germs as possible. You can reduce the number of germs on your skin by washing with CHG (chlorahexidine gluconate) Soap before surgery.  CHG is an antiseptic cleaner which kills germs and bonds with the skin to continue killing germs even after washing.    Oral Hygiene is also important to reduce your risk of infection.  Remember - BRUSH YOUR TEETH THE MORNING OF SURGERY WITH YOUR REGULAR TOOTHPASTE  Please do not use if you have an allergy to CHG or antibacterial soaps. If your skin becomes reddened/irritated stop using the CHG.  Do not shave (including legs and underarms) for at least 48 hours prior to first CHG shower. It is OK to shave your face.  Please follow these instructions carefully.   Shower the NIGHT BEFORE SURGERY and the MORNING OF SURGERY  If you chose to wash your hair, wash your  hair first as usual with your normal shampoo.  After you shampoo, rinse your hair and body thoroughly to remove the shampoo.  Use CHG Soap as you would any other liquid soap. You can apply CHG directly to the skin and wash gently with a scrungie or a clean washcloth.   Apply the CHG Soap to your body ONLY FROM THE NECK DOWN.  Do not use on open wounds or open sores. Avoid contact with your eyes, ears, mouth and genitals (private parts). Wash Face and genitals (private parts)  with your normal soap.   Wash thoroughly, paying  special attention to the area where your surgery will be performed.  Thoroughly rinse your body with warm water from the neck down.  DO NOT shower/wash with your normal soap after using and rinsing off the CHG Soap.  Pat yourself dry with a CLEAN TOWEL.  Wear CLEAN PAJAMAS to bed the night before surgery  Place CLEAN SHEETS on your bed the night before your surgery  DO NOT SLEEP WITH PETS.   Day of Surgery: Take a shower with CHG soap. Do not wear jewelry or makeup Do not wear lotions, powders, perfumes/colognes, or deodorant. Do not shave 48 hours prior to surgery.  Men may shave face and neck. Do not bring valuables to the hospital.  [facility] is not responsible for any belongings or valuables. Do not wear nail polish, gel polish, artificial nails, or any other type of covering on natural nails (fingers and toes) If you have artificial nails or gel coating that need to be removed by a nail salon, please have this removed prior to surgery. Artificial nails or gel coating may interfere with anesthesia's ability to adequately monitor your vital signs. Wear Clean/Comfortable clothing the morning of surgery Remember to brush your teeth WITH YOUR REGULAR TOOTHPASTE.   Please read over the following fact sheets that you were given.    If you received a COVID test during your pre-op visit  it is requested that you wear a mask when out in public, stay away from anyone that may not be feeling well and notify your surgeon if you develop symptoms. If you have been in contact with anyone that has tested positive in the last 10 days please notify you surgeon.

## 2022-06-06 ENCOUNTER — Encounter (HOSPITAL_COMMUNITY)
Admission: RE | Admit: 2022-06-06 | Discharge: 2022-06-06 | Disposition: A | Discharge status: Home / Self Care | Payer: PPO | Source: Ambulatory Visit | Attending: Neurosurgery | Admitting: Neurosurgery

## 2022-06-06 ENCOUNTER — Other Ambulatory Visit: Payer: Self-pay

## 2022-06-06 ENCOUNTER — Encounter (HOSPITAL_COMMUNITY): Payer: Self-pay

## 2022-06-06 VITALS — BP 151/86 | HR 80 | Temp 97.8°F | Resp 18 | Ht <= 58 in | Wt 159.9 lb

## 2022-06-06 DIAGNOSIS — Z01818 Encounter for other preprocedural examination: Secondary | ICD-10-CM

## 2022-06-06 DIAGNOSIS — Z01812 Encounter for preprocedural laboratory examination: Secondary | ICD-10-CM | POA: Insufficient documentation

## 2022-06-06 DIAGNOSIS — I10 Essential (primary) hypertension: Secondary | ICD-10-CM | POA: Diagnosis not present

## 2022-06-06 LAB — BASIC METABOLIC PANEL
Anion gap: 12 (ref 5–15)
BUN: 8 mg/dL (ref 8–23)
CO2: 27 mmol/L (ref 22–32)
Calcium: 9.6 mg/dL (ref 8.9–10.3)
Chloride: 99 mmol/L (ref 98–111)
Creatinine, Ser: 0.89 mg/dL (ref 0.44–1.00)
GFR, Estimated: >60 mL/min (ref >60)
Glucose, Bld: 96 mg/dL (ref 70–99)
Potassium: 3.8 mmol/L (ref 3.5–5.1)
Sodium: 138 mmol/L (ref 135–145)

## 2022-06-06 LAB — CBC
HCT: 39.3 % (ref 36.0–46.0)
Hemoglobin: 13.6 g/dL (ref 12.0–15.0)
MCH: 30.4 pg (ref 26.0–34.0)
MCHC: 34.6 g/dL (ref 30.0–36.0)
MCV: 87.9 fL (ref 80.0–100.0)
Platelets: 373 10*3/uL (ref 150–400)
RBC: 4.47 MIL/uL (ref 3.87–5.11)
RDW: 14.1 % (ref 11.5–15.5)
WBC: 7.9 10*3/uL (ref 4.0–10.5)
nRBC: 0 % (ref 0.0–0.2)

## 2022-06-06 LAB — SURGICAL PCR SCREEN
MRSA, PCR: NEGATIVE
Staphylococcus aureus: NEGATIVE

## 2022-06-06 NOTE — Progress Notes (Signed)
Orders received after initial progress note with instruction generated. Updated patient information on instructions to NPO at midnight before day of surgery   PCP - Dr. Doristine Bosworth Cardiologist - Not in many years, but yes Dr. Golden Hurter  Chest x-ray - [facility] EKG - 08/31/21 Stress Test - 04/16/12 ECHO - 04/16/12 Cardiac Cath - Denies  Sleep Study - Denies  DM - Denies  Blood Thinner Instructions: Denies Aspirin Instructions:Haven't taken for a week and a half  COVID TEST- [facility]   Anesthesia review: Yes cardiac history  Patient denies shortness of breath, fever, cough and chest pain at PAT appointment   All instructions explained to the patient, with a verbal understanding of the material. Patient agrees to go over the instructions while at home for a better understanding. The opportunity to ask questions was provided.

## 2022-06-08 NOTE — Anesthesia Preprocedure Evaluation (Signed)
Anesthesia Evaluation  Patient identified by MRN, date of birth, ID band Patient awake    Reviewed: Allergy & Precautions, NPO status , Patient's Chart, lab work & pertinent test results  History of Anesthesia Complications (+) PONV and history of anesthetic complications  Airway Mallampati: II  TM Distance: >3 FB Neck ROM: Full    Dental no notable dental hx.    Pulmonary neg pulmonary ROS,    Pulmonary exam normal        Cardiovascular hypertension, Pt. on medications and Pt. on home beta blockers  Rhythm:Regular Rate:Normal     Neuro/Psych negative neurological ROS  negative psych ROS   GI/Hepatic Neg liver ROS, GERD  ,  Endo/Other  Hypothyroidism   Renal/GU negative Renal ROS  negative genitourinary   Musculoskeletal  (+) Arthritis , Fibromyalgia -  Abdominal Normal abdominal exam  (+)   Peds  Hematology negative hematology ROS (+)   Anesthesia Other Findings   Reproductive/Obstetrics                            Anesthesia Physical Anesthesia Plan  ASA: 2  Anesthesia Plan: General   Post-op Pain Management:    Induction: Intravenous  PONV Risk Score and Plan: 4 or greater and Ondansetron, Dexamethasone, Treatment may vary due to age or medical condition and Amisulpride  Airway Management Planned: Mask and Oral ETT  Additional Equipment: None  Intra-op Plan:   Post-operative Plan: Extubation in OR  Informed Consent: I have reviewed the patients History and Physical, chart, labs and discussed the procedure including the risks, benefits and alternatives for the proposed anesthesia with the patient or authorized representative who has indicated his/her understanding and acceptance.     Dental advisory given  Plan Discussed with: CRNA  Anesthesia Plan Comments: (See APP note by Joslyn Hy, FNP   Lab Results      Component                Value               Date                       WBC                      7.9                 06/06/2022                HGB                      13.6                06/06/2022                HCT                      39.3                06/06/2022                MCV                      87.9                06/06/2022  PLT                      373                 06/06/2022           Lab Results      Component                Value               Date                      NA                       138                 06/06/2022                K                        3.8                 06/06/2022                CO2                      27                  06/06/2022                GLUCOSE                  96                  06/06/2022                BUN                      8                   06/06/2022                CREATININE               0.89                06/06/2022                CALCIUM                  9.6                 06/06/2022                EGFR                     70 (L)              11/30/2016                GFRNONAA                 >60                 06/06/2022          )       Anesthesia Quick Evaluation

## 2022-06-08 NOTE — Progress Notes (Signed)
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 This was suppressed with beta blockers but BB caused pt fatigue and she stopped them. Tachycardia did not recur and Dr. Theodosia Blender note 03/22/15 indicates tachycardia may have been caused by chronic pain. It appears pt restarted beta blocker at some point (as she is currently taking bystolic)    - Neurologist is Margette Fast, MD who sees pt for peripheral neuropathy - Urologist is Louis Meckel, MD - Oncologist is Truitt Merle, MD  LABS: Labs reviewed: Acceptable for surgery. (all labs ordered are listed, but only abnormal results are displayed)  Labs Reviewed  SURGICAL PCR SCREEN  BASIC METABOLIC PANEL  CBC     IMAGES: CT chest with contrast 05/15/22:  1. Unchanged appearance of thick-walled fluid collection in the deep left chest wall, hypermetabolic on May 4481 PET. This may represent postsurgical change, although recommend correlation with mammography as previously recommended. 2. Scattered areas of irregular subpleural opacity, reticulation and ground-glass throughout both lungs, which may be due to chronic interstitial lung disease. This can be further assessed with high-resolution chest CT as clinically indicated, three-month interval would allow for assessment of interval change. 3. Mild dilatation of the common bile duct without intrahepatic biliary ductal  dilatation. This is not significantly changed from 2020 abdominal CT, and is likely chronic. Stable cyst in the left lobe of the liver. 4. Multiple thoracic and lumbar compression fractures which are stable from prior imaging. Posterior fusion from T10 through S1. No acute compression fracture. 5. Left anterior seventh rib fracture with sclerotic margins suggesting delayed union, this appeared to be acute on 02/14/2022 thoracic spine CT. The previous left posterior tenth rib fracture has healed. 6. Small hiatal hernia.   EKG 08/31/21: NSR. ST & T wave abnormality, consider inferior ischemia. Appears stable when compared to EKG 03/16/20. Pt without active CV symptoms at pre-admission testing.    CV: Echo (mild diastolic dysfunction, trace MR and TR) and normal stress test from 2013 are in [place].    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   Hypertension    Hypothyroidism    Obesity    PONV (  postoperative nausea and vomiting)    Reflux    buring and dysphagia sx resp to PPI; egd 3/06 neg for Barretts   Tachycardia     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   Yeast infection    hx of     Past Surgical History:  Procedure Laterality Date   AXILLARY LYMPH NODE BIOPSY Left 12/27/2015   Procedure: LEFT AXILLARY SENTINEL LYMPH NODE BIOPSY;  Surgeon: [name], MD;  Location: [place];  Service: General;  Laterality: Left;   BELPHAROPTOSIS REPAIR Bilateral    BREAST LUMPECTOMY WITH RADIOACTIVE SEED  LOCALIZATION Left 12/01/2015   Procedure: LEFT BREAST [place] RADIOACTIVE SEED LOCALIZATION;  Surgeon: [name], MD;  Location: [place];  Service: General;  Laterality: Left;   BREAST SURGERY     CATARACT EXTRACTION, BILATERAL Bilateral 08/2018   COLONOSCOPY  01/2005   NEG   cortisone injections     right arm; back   EYE SURGERY     cosmetic; eyelid surgery bilat    FINGER SURGERY Left    left index finger/ cyst 02/2007   KNEE ARTHROSCOPY Right 04/13/2015   Procedure: ARTHROSCOPY RIGHT KNEE WITH MEDIAL MENISCAL DEBRIDEMENT AND CHONDROPLASTY;  Surgeon: Gaynelle Arabian, MD;  [facility];  Service: Orthopedics;  Laterality: Right;   KYPHOPLASTY N/A 03/16/2020   Procedure: KYPHOPLASTY THORACIC THREE, THORACIC FOUR, THORACIC SIX;  Surgeon: Jovita Gamma, MD;  Location: [place];  Service: Neurosurgery;  Laterality: N/A;   LAMINECTOMY WITH POSTERIOR LATERAL ARTHRODESIS LEVEL 4 N/A 12/20/2021   Procedure: Thoracic Ten-Eleven, Thoracic Eleven-Twelve, Thoracic Twelve-Lumbar One, Lumbar One-Two Posterior Lateral Fusion;  Surgeon: Kary Kos, MD;  Location: [place];  Service: Neurosurgery;  Laterality: N/A;   left rotator cuff repair Left    12/2006   LUMBAR FUSION     x3   LUMBAR LAMINECTOMY     TONSILLECTOMY      MEDICATIONS:  acetaminophen (TYLENOL) 500 MG tablet   Calcium-Vitamin D-Vitamin K (CALCIUM + D) [phone] MG-UNT-MCG CHEW   cholecalciferol (VITAMIN D) 25 MCG (1000 UNIT) tablet   CRANBERRY PO   Cyanocobalamin (VITAMIN B-12) 5000 MCG SUBL   famotidine (PEPCID) 20 MG tablet   gabapentin (NEURONTIN) 300 MG capsule   Glucos-Chondroit-Hyaluron-MSM (GLUCOSAMINE CHONDROITIN JOINT PO)   hydrochlorothiazide (HYDRODIURIL) 12.5 MG tablet   HYDROcodone-acetaminophen (NORCO/VICODIN) 5-325 MG tablet   Lactobacillus-Inulin (CULTURELLE DIGESTIVE DAILY PO)   levothyroxine (SYNTHROID, LEVOTHROID) 25 MCG tablet   loratadine (CLARITIN) 10 MG tablet   Magnesium Oxide 200 MG TABS    Menatetrenone (VITAMIN K2) 100 MCG TABS   methocarbamol (ROBAXIN) 500 MG tablet   Multiple Vitamins-Minerals (PRESERVISION AREDS 2) CAPS   naproxen sodium (ALEVE) 220 MG tablet   nebivolol (BYSTOLIC) 5 MG tablet   Omega-3 Fatty Acids (FISH OIL PO)   Polyethyl Glyc-Propyl Glyc PF (SYSTANE ULTRA PF) 0.4-0.3 % SOLN   potassium chloride (KLOR-CON) 10 MEQ tablet   pyridOXINE (VITAMIN B-6) 100 MG tablet   vitamin C (ASCORBIC ACID) 500 MG tablet   No current facility-administered medications for this encounter.    If no changes, I anticipate pt can proceed with surgery as scheduled.   Willeen Cass, PhD, FNP-BC [facility]/Anesthesiology Phone: [phone] 06/08/2022 11:16 AM

## 2022-06-13 ENCOUNTER — Inpatient Hospital Stay (HOSPITAL_COMMUNITY): Payer: PPO | Admitting: Certified Registered"

## 2022-06-13 ENCOUNTER — Other Ambulatory Visit: Payer: Self-pay

## 2022-06-13 ENCOUNTER — Inpatient Hospital Stay (HOSPITAL_COMMUNITY)
Admission: RE | Admit: 2022-06-13 | Discharge: 2022-06-14 | DRG: 496 | Disposition: A | Discharge status: Home / Self Care | Payer: PPO | Attending: Neurosurgery | Admitting: Neurosurgery

## 2022-06-13 ENCOUNTER — Encounter (HOSPITAL_COMMUNITY)
Admission: RE | Disposition: A | Discharge status: Home / Self Care | Payer: Self-pay | Source: Home / Self Care | Attending: Neurosurgery

## 2022-06-13 ENCOUNTER — Encounter (HOSPITAL_COMMUNITY): Payer: Self-pay | Admitting: Neurosurgery

## 2022-06-13 ENCOUNTER — Inpatient Hospital Stay (HOSPITAL_COMMUNITY): Payer: PPO | Admitting: Emergency Medicine

## 2022-06-13 ENCOUNTER — Inpatient Hospital Stay (HOSPITAL_COMMUNITY): Payer: PPO

## 2022-06-13 DIAGNOSIS — Z807 Family history of other malignant neoplasms of lymphoid, hematopoietic and related tissues: Secondary | ICD-10-CM

## 2022-06-13 DIAGNOSIS — Z79890 Hormone replacement therapy (postmenopausal): Secondary | ICD-10-CM

## 2022-06-13 DIAGNOSIS — E039 Hypothyroidism, unspecified: Secondary | ICD-10-CM | POA: Diagnosis present

## 2022-06-13 DIAGNOSIS — Z823 Family history of stroke: Secondary | ICD-10-CM | POA: Diagnosis not present

## 2022-06-13 DIAGNOSIS — I10 Essential (primary) hypertension: Secondary | ICD-10-CM | POA: Diagnosis not present

## 2022-06-13 DIAGNOSIS — K219 Gastro-esophageal reflux disease without esophagitis: Secondary | ICD-10-CM | POA: Diagnosis present

## 2022-06-13 DIAGNOSIS — M797 Fibromyalgia: Secondary | ICD-10-CM | POA: Diagnosis not present

## 2022-06-13 DIAGNOSIS — T84296A Other mechanical complication of internal fixation device of vertebrae, initial encounter: Secondary | ICD-10-CM | POA: Diagnosis not present

## 2022-06-13 DIAGNOSIS — Z811 Family history of alcohol abuse and dependence: Secondary | ICD-10-CM

## 2022-06-13 DIAGNOSIS — T84226A Displacement of internal fixation device of vertebrae, initial encounter: Secondary | ICD-10-CM

## 2022-06-13 DIAGNOSIS — Z91041 Radiographic dye allergy status: Secondary | ICD-10-CM | POA: Diagnosis not present

## 2022-06-13 DIAGNOSIS — Z9842 Cataract extraction status, left eye: Secondary | ICD-10-CM | POA: Diagnosis not present

## 2022-06-13 DIAGNOSIS — Z803 Family history of malignant neoplasm of breast: Secondary | ICD-10-CM | POA: Diagnosis not present

## 2022-06-13 DIAGNOSIS — Z79899 Other long term (current) drug therapy: Secondary | ICD-10-CM

## 2022-06-13 DIAGNOSIS — Z880 Allergy status to penicillin: Secondary | ICD-10-CM | POA: Diagnosis not present

## 2022-06-13 DIAGNOSIS — M4854XA Collapsed vertebra, not elsewhere classified, thoracic region, initial encounter for fracture: Secondary | ICD-10-CM

## 2022-06-13 DIAGNOSIS — Z9841 Cataract extraction status, right eye: Secondary | ICD-10-CM

## 2022-06-13 DIAGNOSIS — Z981 Arthrodesis status: Secondary | ICD-10-CM

## 2022-06-13 DIAGNOSIS — Z888 Allergy status to other drugs, medicaments and biological substances status: Secondary | ICD-10-CM | POA: Diagnosis not present

## 2022-06-13 DIAGNOSIS — Z808 Family history of malignant neoplasm of other organs or systems: Secondary | ICD-10-CM

## 2022-06-13 DIAGNOSIS — S22000A Wedge compression fracture of unspecified thoracic vertebra, initial encounter for closed fracture: Principal | ICD-10-CM | POA: Diagnosis present

## 2022-06-13 DIAGNOSIS — M5414 Radiculopathy, thoracic region: Secondary | ICD-10-CM | POA: Diagnosis present

## 2022-06-13 DIAGNOSIS — M8008XA Age-related osteoporosis with current pathological fracture, vertebra(e), initial encounter for fracture: Secondary | ICD-10-CM | POA: Diagnosis not present

## 2022-06-13 DIAGNOSIS — Z8041 Family history of malignant neoplasm of ovary: Secondary | ICD-10-CM | POA: Diagnosis not present

## 2022-06-13 DIAGNOSIS — M40204 Unspecified kyphosis, thoracic region: Secondary | ICD-10-CM | POA: Diagnosis not present

## 2022-06-13 DIAGNOSIS — Y752 Prosthetic and other implants, materials and neurological devices associated with adverse incidents: Secondary | ICD-10-CM | POA: Diagnosis present

## 2022-06-13 DIAGNOSIS — Z818 Family history of other mental and behavioral disorders: Secondary | ICD-10-CM

## 2022-06-13 DIAGNOSIS — Z833 Family history of diabetes mellitus: Secondary | ICD-10-CM

## 2022-06-13 HISTORY — PX: HARDWARE REMOVAL: SHX979

## 2022-06-13 HISTORY — PX: KYPHOPLASTY: SHX5884

## 2022-06-13 SURGERY — REMOVAL, HARDWARE
Anesthesia: General | Site: Back

## 2022-06-13 MED ORDER — ACETAMINOPHEN 325 MG PO TABS: 650.0000 mg | ORAL_TABLET | ORAL | Status: DC | PRN

## 2022-06-13 MED ORDER — DIPHENHYDRAMINE HCL 50 MG/ML IJ SOLN
INTRAMUSCULAR | Status: DC | PRN
  Administered 2022-06-13: 12.5 mg via INTRAVENOUS

## 2022-06-13 MED ORDER — ASCORBIC ACID 500 MG PO TABS
500.0000 mg | ORAL_TABLET | Freq: Every day | ORAL | Status: DC
  Administered 2022-06-14: 500 mg via ORAL
  Filled 2022-06-13: qty 1

## 2022-06-13 MED ORDER — THROMBIN (RECOMBINANT) 5000 UNITS EX SOLR
CUTANEOUS | Status: DC | PRN
  Administered 2022-06-13: 10 mL via TOPICAL

## 2022-06-13 MED ORDER — PHENOL 1.4 % MT LIQD: 1.0000 | OROMUCOSAL | Status: DC | PRN

## 2022-06-13 MED ORDER — PROPOFOL 10 MG/ML IV BOLUS
INTRAVENOUS | Status: AC
  Filled 2022-06-13: qty 20

## 2022-06-13 MED ORDER — OYSTER SHELL CALCIUM/D3 500-5 MG-MCG PO TABS
1.0000 | ORAL_TABLET | Freq: Every day | ORAL | Status: DC
  Administered 2022-06-14: 1 via ORAL
  Filled 2022-06-13 (×2): qty 1

## 2022-06-13 MED ORDER — 0.9 % SODIUM CHLORIDE (POUR BTL) OPTIME
TOPICAL | Status: DC | PRN
  Administered 2022-06-13: 1000 mL

## 2022-06-13 MED ORDER — ONDANSETRON HCL 4 MG/2ML IJ SOLN
4.0000 mg | Freq: Four times a day (QID) | INTRAMUSCULAR | Status: DC | PRN
Start: 2022-06-13 — End: 2022-06-14

## 2022-06-13 MED ORDER — PANTOPRAZOLE SODIUM 40 MG PO TBEC
40.0000 mg | DELAYED_RELEASE_TABLET | Freq: Every day | ORAL | Status: DC
  Administered 2022-06-13: 40 mg via ORAL
  Filled 2022-06-13: qty 1

## 2022-06-13 MED ORDER — ROCURONIUM BROMIDE 10 MG/ML (PF) SYRINGE
PREFILLED_SYRINGE | INTRAVENOUS | Status: DC | PRN
  Administered 2022-06-13: 50 mg via INTRAVENOUS

## 2022-06-13 MED ORDER — NAPROXEN 250 MG PO TABS
250.0000 mg | ORAL_TABLET | Freq: Every day | ORAL | Status: DC | PRN
Start: 2022-06-13 — End: 2022-06-14
  Filled 2022-06-13: qty 1

## 2022-06-13 MED ORDER — EPHEDRINE SULFATE-NACL 50-0.9 MG/10ML-% IV SOSY
PREFILLED_SYRINGE | INTRAVENOUS | Status: DC | PRN
  Administered 2022-06-13: 5 mg via INTRAVENOUS

## 2022-06-13 MED ORDER — SODIUM CHLORIDE 0.9% FLUSH
3.0000 mL | INTRAVENOUS | Status: DC | PRN
Start: 2022-06-13 — End: 2022-06-14

## 2022-06-13 MED ORDER — HYDROCODONE-ACETAMINOPHEN 5-325 MG PO TABS: 1.0000 | ORAL_TABLET | ORAL | Status: DC | PRN

## 2022-06-13 MED ORDER — VITAMIN K2 100 MCG PO TABS: 100.0000 ug | ORAL_TABLET | Freq: Every day | ORAL | Status: DC

## 2022-06-13 MED ORDER — CHLORHEXIDINE GLUCONATE 0.12 % MT SOLN
15.0000 mL | Freq: Once | OROMUCOSAL | Status: AC
  Administered 2022-06-13: 15 mL via OROMUCOSAL
  Filled 2022-06-13: qty 15

## 2022-06-13 MED ORDER — POLYVINYL ALCOHOL 1.4 % OP SOLN
1.0000 [drp] | Freq: Every day | OPHTHALMIC | Status: DC
  Filled 2022-06-13: qty 15

## 2022-06-13 MED ORDER — LIDOCAINE-EPINEPHRINE 1 %-1:100000 IJ SOLN
INTRAMUSCULAR | Status: AC
  Filled 2022-06-13: qty 1

## 2022-06-13 MED ORDER — HYDROCHLOROTHIAZIDE 12.5 MG PO TABS
12.5000 mg | ORAL_TABLET | Freq: Every day | ORAL | Status: DC
  Administered 2022-06-13 – 2022-06-14 (×2): 12.5 mg via ORAL
  Filled 2022-06-13 (×2): qty 1

## 2022-06-13 MED ORDER — CHLORHEXIDINE GLUCONATE CLOTH 2 % EX PADS: 6.0000 | MEDICATED_PAD | Freq: Once | CUTANEOUS | Status: DC

## 2022-06-13 MED ORDER — ORAL CARE MOUTH RINSE: 15.0000 mL | Freq: Once | OROMUCOSAL | Status: AC

## 2022-06-13 MED ORDER — PANTOPRAZOLE SODIUM 40 MG IV SOLR: 40.0000 mg | Freq: Every day | INTRAVENOUS | Status: DC

## 2022-06-13 MED ORDER — LORATADINE 10 MG PO TABS
10.0000 mg | ORAL_TABLET | Freq: Every day | ORAL | Status: DC | PRN
Start: 2022-06-13 — End: 2022-06-14

## 2022-06-13 MED ORDER — FENTANYL CITRATE (PF) 250 MCG/5ML IJ SOLN
INTRAMUSCULAR | Status: AC
  Filled 2022-06-13: qty 5

## 2022-06-13 MED ORDER — CYCLOBENZAPRINE HCL 10 MG PO TABS
10.0000 mg | ORAL_TABLET | Freq: Three times a day (TID) | ORAL | Status: DC | PRN
  Administered 2022-06-13: 10 mg via ORAL
  Filled 2022-06-13: qty 1

## 2022-06-13 MED ORDER — MENTHOL 3 MG MT LOZG: 1.0000 | LOZENGE | OROMUCOSAL | Status: DC | PRN

## 2022-06-13 MED ORDER — LACTATED RINGERS IV SOLN: INTRAVENOUS | Status: DC

## 2022-06-13 MED ORDER — NEBIVOLOL HCL 5 MG PO TABS
5.0000 mg | ORAL_TABLET | Freq: Every day | ORAL | Status: DC
  Administered 2022-06-14: 5 mg via ORAL
  Filled 2022-06-13: qty 1

## 2022-06-13 MED ORDER — ONDANSETRON HCL 4 MG PO TABS: 4.0000 mg | ORAL_TABLET | Freq: Four times a day (QID) | ORAL | Status: DC | PRN

## 2022-06-13 MED ORDER — CULTURELLE DIGESTIVE DAILY PO CAPS: ORAL_CAPSULE | Freq: Every day | ORAL | Status: DC

## 2022-06-13 MED ORDER — ACETAMINOPHEN 10 MG/ML IV SOLN
1000.0000 mg | Freq: Once | INTRAVENOUS | Status: DC | PRN
  Administered 2022-06-13: 1000 mg via INTRAVENOUS

## 2022-06-13 MED ORDER — HYDROMORPHONE HCL 1 MG/ML IJ SOLN
0.5000 mg | INTRAMUSCULAR | Status: DC | PRN
  Administered 2022-06-13: 0.5 mg via INTRAVENOUS
  Filled 2022-06-13: qty 0.5

## 2022-06-13 MED ORDER — CEFAZOLIN SODIUM-DEXTROSE 2-4 GM/100ML-% IV SOLN
2.0000 g | Freq: Three times a day (TID) | INTRAVENOUS | Status: AC
  Administered 2022-06-13 – 2022-06-14 (×2): 2 g via INTRAVENOUS
  Filled 2022-06-13 (×2): qty 100

## 2022-06-13 MED ORDER — MAGNESIUM OXIDE -MG SUPPLEMENT 400 (240 MG) MG PO TABS
200.0000 mg | ORAL_TABLET | Freq: Every day | ORAL | Status: DC
  Filled 2022-06-13: qty 1

## 2022-06-13 MED ORDER — VITAMIN D3 25 MCG (1000 UNIT) PO TABS
1000.0000 [IU] | ORAL_TABLET | Freq: Every day | ORAL | Status: DC
  Administered 2022-06-14: 1000 [IU] via ORAL
  Filled 2022-06-13 (×3): qty 1

## 2022-06-13 MED ORDER — METHOCARBAMOL 500 MG PO TABS: 500.0000 mg | ORAL_TABLET | Freq: Four times a day (QID) | ORAL | Status: DC | PRN

## 2022-06-13 MED ORDER — THROMBIN 5000 UNITS EX SOLR
CUTANEOUS | Status: AC
  Filled 2022-06-13: qty 10000

## 2022-06-13 MED ORDER — BUPIVACAINE HCL (PF) 0.25 % IJ SOLN
INTRAMUSCULAR | Status: AC
  Filled 2022-06-13: qty 30

## 2022-06-13 MED ORDER — SODIUM CHLORIDE 0.9 % IV SOLN
250.0000 mL | INTRAVENOUS | Status: DC
  Administered 2022-06-13: 250 mL via INTRAVENOUS

## 2022-06-13 MED ORDER — VANCOMYCIN HCL IN DEXTROSE 1-5 GM/200ML-% IV SOLN
1000.0000 mg | INTRAVENOUS | Status: AC
  Administered 2022-06-13: 1000 mg via INTRAVENOUS
  Filled 2022-06-13: qty 200

## 2022-06-13 MED ORDER — VITAMIN B-6 100 MG PO TABS
100.0000 mg | ORAL_TABLET | Freq: Every day | ORAL | Status: DC
  Administered 2022-06-14: 100 mg via ORAL
  Filled 2022-06-13 (×2): qty 1

## 2022-06-13 MED ORDER — FAMOTIDINE 20 MG PO TABS
20.0000 mg | ORAL_TABLET | Freq: Every day | ORAL | Status: DC
  Administered 2022-06-14: 20 mg via ORAL
  Filled 2022-06-13: qty 1

## 2022-06-13 MED ORDER — SUGAMMADEX SODIUM 200 MG/2ML IV SOLN
INTRAVENOUS | Status: DC | PRN
  Administered 2022-06-13: 200 mg via INTRAVENOUS

## 2022-06-13 MED ORDER — FENTANYL CITRATE (PF) 250 MCG/5ML IJ SOLN
INTRAMUSCULAR | Status: DC | PRN
Start: 2022-06-13 — End: 2022-06-13
  Administered 2022-06-13: 100 ug via INTRAVENOUS

## 2022-06-13 MED ORDER — POTASSIUM CHLORIDE ER 10 MEQ PO TBCR
10.0000 meq | EXTENDED_RELEASE_TABLET | Freq: Two times a day (BID) | ORAL | Status: DC
Start: 2022-06-13 — End: 2022-06-14
  Administered 2022-06-13 – 2022-06-14 (×2): 10 meq via ORAL
  Filled 2022-06-13 (×3): qty 1

## 2022-06-13 MED ORDER — SODIUM CHLORIDE 0.9% FLUSH: 3.0000 mL | Freq: Two times a day (BID) | INTRAVENOUS | Status: DC

## 2022-06-13 MED ORDER — RISAQUAD PO CAPS
1.0000 | ORAL_CAPSULE | Freq: Every day | ORAL | Status: DC
  Administered 2022-06-13 – 2022-06-14 (×2): 1 via ORAL
  Filled 2022-06-13 (×2): qty 1

## 2022-06-13 MED ORDER — PROPOFOL 10 MG/ML IV BOLUS
INTRAVENOUS | Status: DC | PRN
  Administered 2022-06-13: 120 mg via INTRAVENOUS

## 2022-06-13 MED ORDER — ACETAMINOPHEN 650 MG RE SUPP: 650.0000 mg | RECTAL | Status: DC | PRN

## 2022-06-13 MED ORDER — PROSIGHT PO TABS
1.0000 | ORAL_TABLET | Freq: Every day | ORAL | Status: DC
  Administered 2022-06-14: 1 via ORAL
  Filled 2022-06-13 (×2): qty 1

## 2022-06-13 MED ORDER — ACETAMINOPHEN 10 MG/ML IV SOLN
INTRAVENOUS | Status: AC
  Filled 2022-06-13: qty 100

## 2022-06-13 MED ORDER — ONDANSETRON HCL 4 MG/2ML IJ SOLN
INTRAMUSCULAR | Status: DC | PRN
  Administered 2022-06-13: 4 mg via INTRAVENOUS

## 2022-06-13 MED ORDER — VITAMIN B-12 1000 MCG PO TABS
5000.0000 ug | ORAL_TABLET | Freq: Every day | ORAL | Status: DC
  Filled 2022-06-13: qty 5

## 2022-06-13 MED ORDER — FENTANYL CITRATE (PF) 100 MCG/2ML IJ SOLN: 25.0000 ug | INTRAMUSCULAR | Status: DC | PRN

## 2022-06-13 MED ORDER — ALUM & MAG HYDROXIDE-SIMETH 200-200-20 MG/5ML PO SUSP: 30.0000 mL | Freq: Four times a day (QID) | ORAL | Status: DC | PRN

## 2022-06-13 MED ORDER — LEVOTHYROXINE SODIUM 25 MCG PO TABS: 25.0000 ug | ORAL_TABLET | ORAL | Status: DC

## 2022-06-13 MED ORDER — DEXAMETHASONE SODIUM PHOSPHATE 10 MG/ML IJ SOLN
INTRAMUSCULAR | Status: DC | PRN
  Administered 2022-06-13: 10 mg via INTRAVENOUS

## 2022-06-13 MED ORDER — GABAPENTIN 300 MG PO CAPS
300.0000 mg | ORAL_CAPSULE | Freq: Three times a day (TID) | ORAL | Status: DC
  Administered 2022-06-13 – 2022-06-14 (×2): 300 mg via ORAL
  Filled 2022-06-13 (×2): qty 1

## 2022-06-13 MED ORDER — LIDOCAINE-EPINEPHRINE 1 %-1:100000 IJ SOLN
INTRAMUSCULAR | Status: DC | PRN
  Administered 2022-06-13: 10 mL

## 2022-06-13 MED ORDER — AMISULPRIDE (ANTIEMETIC) 5 MG/2ML IV SOLN: 10.0000 mg | Freq: Once | INTRAVENOUS | Status: DC | PRN

## 2022-06-13 MED ORDER — ACETAMINOPHEN 500 MG PO TABS
1000.0000 mg | ORAL_TABLET | Freq: Four times a day (QID) | ORAL | Status: DC | PRN
  Administered 2022-06-13 – 2022-06-14 (×2): 1000 mg via ORAL
  Filled 2022-06-13 (×3): qty 2

## 2022-06-13 MED ORDER — LIDOCAINE 2% (20 MG/ML) 5 ML SYRINGE
INTRAMUSCULAR | Status: DC | PRN
  Administered 2022-06-13: 60 mg via INTRAVENOUS

## 2022-06-13 SURGICAL SUPPLY — 52 items
BAG COUNTER SPONGE SURGICOUNT (BAG) ×3 IMPLANT
BENZOIN TINCTURE PRP APPL 2/3 (GAUZE/BANDAGES/DRESSINGS) ×3 IMPLANT
BLADE CLIPPER SURG (BLADE) IMPLANT
BUR MATCHSTICK NEURO 3.0 LAGG (BURR) ×3 IMPLANT
BUR PRECISION FLUTE 6.0 (BURR) IMPLANT
CANISTER SUCT 3000ML PPV (MISCELLANEOUS) IMPLANT
CARTRIDGE OIL MAESTRO DRILL (MISCELLANEOUS) ×2 IMPLANT
CEMENT KYPHON CX01A KIT/MIXER (Cement) ×1 IMPLANT
DERMABOND ADVANCED (GAUZE/BANDAGES/DRESSINGS) ×1
DERMABOND ADVANCED .7 DNX12 (GAUZE/BANDAGES/DRESSINGS) ×2 IMPLANT
DIFFUSER DRILL AIR PNEUMATIC (MISCELLANEOUS) ×3 IMPLANT
DRAPE C-ARM 42X72 X-RAY (DRAPES) ×2 IMPLANT
DRAPE C-ARMOR (DRAPES) ×1 IMPLANT
DRAPE HALF SHEET 40X57 (DRAPES) IMPLANT
DRAPE LAPAROTOMY 100X72X124 (DRAPES) ×3 IMPLANT
DRAPE SURG 17X23 STRL (DRAPES) ×3 IMPLANT
DRSG OPSITE POSTOP 4X6 (GAUZE/BANDAGES/DRESSINGS) ×1 IMPLANT
ELECT REM PT RETURN 9FT ADLT (ELECTROSURGICAL) ×3
ELECTRODE REM PT RTRN 9FT ADLT (ELECTROSURGICAL) ×2 IMPLANT
GAUZE 4X4 16PLY ~~LOC~~+RFID DBL (SPONGE) IMPLANT
GAUZE SPONGE 4X4 12PLY STRL (GAUZE/BANDAGES/DRESSINGS) ×3 IMPLANT
GLOVE BIO SURGEON STRL SZ7 (GLOVE) IMPLANT
GLOVE BIO SURGEON STRL SZ8 (GLOVE) ×3 IMPLANT
GLOVE BIOGEL PI IND STRL 7.0 (GLOVE) IMPLANT
GLOVE BIOGEL PI INDICATOR 7.0 (GLOVE) ×1
GLOVE EXAM NITRILE XL STR (GLOVE) IMPLANT
GLOVE INDICATOR 8.5 STRL (GLOVE) ×6 IMPLANT
GOWN STRL REUS W/ TWL LRG LVL3 (GOWN DISPOSABLE) ×2 IMPLANT
GOWN STRL REUS W/ TWL XL LVL3 (GOWN DISPOSABLE) ×4 IMPLANT
GOWN STRL REUS W/TWL 2XL LVL3 (GOWN DISPOSABLE) IMPLANT
GOWN STRL REUS W/TWL LRG LVL3 (GOWN DISPOSABLE) ×12
GOWN STRL REUS W/TWL XL LVL3 (GOWN DISPOSABLE) ×6
KIT BASIN OR (CUSTOM PROCEDURE TRAY) ×3 IMPLANT
KIT TURNOVER KIT B (KITS) ×3 IMPLANT
NDL SPNL 22GX3.5 QUINCKE BK (NEEDLE) ×2 IMPLANT
NEEDLE HYPO 22GX1.5 SAFETY (NEEDLE) ×3 IMPLANT
NEEDLE SPNL 22GX3.5 QUINCKE BK (NEEDLE) ×3 IMPLANT
NS IRRIG 1000ML POUR BTL (IV SOLUTION) ×3 IMPLANT
OIL CARTRIDGE MAESTRO DRILL (MISCELLANEOUS) ×3
PACK LAMINECTOMY NEURO (CUSTOM PROCEDURE TRAY) ×3 IMPLANT
RASP 3.0MM (RASP) ×1 IMPLANT
SPIKE FLUID TRANSFER (MISCELLANEOUS) ×3 IMPLANT
SPONGE SURGIFOAM ABS GEL SZ50 (HEMOSTASIS) ×3 IMPLANT
STRIP CLOSURE SKIN 1/2X4 (GAUZE/BANDAGES/DRESSINGS) ×3 IMPLANT
SUT VIC AB 0 CT1 18XCR BRD8 (SUTURE) ×2 IMPLANT
SUT VIC AB 0 CT1 8-18 (SUTURE) ×3
SUT VIC AB 2-0 CT1 18 (SUTURE) ×3 IMPLANT
SUT VICRYL 4-0 PS2 18IN ABS (SUTURE) ×3 IMPLANT
TOWEL GREEN STERILE (TOWEL DISPOSABLE) ×3 IMPLANT
TOWEL GREEN STERILE FF (TOWEL DISPOSABLE) ×3 IMPLANT
TRAY KYPHOPAK 20/3 ONESTEP 1ST (MISCELLANEOUS) ×1 IMPLANT
WATER STERILE IRR 1000ML POUR (IV SOLUTION) ×3 IMPLANT

## 2022-06-13 NOTE — H&P (Signed)
Audrey [name] is an 81 y.o. female.   Chief Complaint: Right-sided back pain HPI: 81 year old female longstanding back pain undergone multiple surgeries and kyphoplasties who presented with right-sided back pain that has been intractable.  Work-up is revealed compression deformity at the top of her construct at T10 and that was consistent with where she would localize her back pain and she had some right lateral chest wall radiculopathy consistent with T10.  Remainder the hardware appears to be intact and solid fusion appears to be progressing below that but due to her intractable pain consistent with a T10 radicular pattern I felt it was referable to motion around loose screws that she has developed the top of her construct.  So I recommended cutting the rod removal of the screws injecting Kyphon through the screw holes for vertebral body augmentation.  Extensive review of the risks and benefits of the operation with her as well as perioperative course expectations of outcome and alternatives of surgery and she understood and agreed to proceed forward.  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   Hypertension    Hypothyroidism    Obesity    PONV (postoperative nausea and vomiting)    Reflux    buring and dysphagia sx resp to PPI; egd 3/06 neg for Barretts   Tachycardia     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   Yeast infection    hx of     Past Surgical History:  Procedure Laterality Date   AXILLARY LYMPH NODE BIOPSY  Left 12/27/2015   Procedure: LEFT AXILLARY SENTINEL LYMPH NODE BIOPSY;  Surgeon: [name], MD;  Location: [place];  Service: General;  Laterality: Left;   BELPHAROPTOSIS REPAIR Bilateral    BREAST LUMPECTOMY WITH RADIOACTIVE SEED LOCALIZATION Left 12/01/2015   Procedure: LEFT BREAST [place] RADIOACTIVE SEED LOCALIZATION;  Surgeon: [name], MD;  Location: [place];  Service: General;  Laterality: Left;   BREAST SURGERY     CATARACT EXTRACTION, BILATERAL Bilateral 08/2018   COLONOSCOPY  01/2005   NEG   cortisone injections     right arm; back   EYE SURGERY     cosmetic; eyelid surgery bilat    FINGER SURGERY Left    left index finger/ cyst 02/2007   KNEE ARTHROSCOPY Right 04/13/2015   Procedure: ARTHROSCOPY RIGHT KNEE WITH MEDIAL MENISCAL DEBRIDEMENT AND CHONDROPLASTY;  Surgeon: Gaynelle Arabian, MD;  [facility];  Service: Orthopedics;  Laterality: Right;   KYPHOPLASTY N/A 03/16/2020   Procedure: KYPHOPLASTY THORACIC THREE, THORACIC FOUR, THORACIC SIX;  Surgeon: Jovita Gamma, MD;  Location: [place];  Service: Neurosurgery;  Laterality: N/A;   LAMINECTOMY WITH POSTERIOR LATERAL ARTHRODESIS LEVEL 4 N/A 12/20/2021   Procedure: Thoracic Ten-Eleven, Thoracic Eleven-Twelve, Thoracic Twelve-Lumbar One, Lumbar One-Two Posterior Lateral Fusion;  Surgeon: Kary Kos, MD;  Location: [place];  Service: Neurosurgery;  Laterality: N/A;   left rotator cuff repair  Left    12/2006   LUMBAR FUSION     x3   LUMBAR LAMINECTOMY     TONSILLECTOMY      Family History  Problem Relation Age of Onset   Diabetes Mother    Breast cancer Mother        dx. late 26s-early 46s; "metastasis to throat"?   Hodgkin's lymphoma Father 9   Breast cancer Sister        maternal half-sister dx. w/ DCIS in her late 38s;   Other Sister        learning disabilities; lives on her own   Diabetes Sister    Alcohol abuse Brother    Breast cancer Maternal Aunt        dx. 80s   Ovarian cancer  Maternal Aunt 59   Breast cancer Paternal Aunt        dx. early 22s   Heart Problems Paternal Aunt    Stroke Maternal Grandfather    Depression Paternal Grandmother    Heart Problems Paternal Grandmother    Skin cancer Daughter        dx. early 103s   Other Daughter        insterstitial cystitis   Neuropathy Neg Hx    Social History:  reports that she has never smoked. She has never used smokeless tobacco. She reports that she does not currently use drugs. She reports that she does not drink alcohol.  Allergies:  Allergies  Allergen Reactions   Contrast Media [Iodinated Contrast Media] Shortness Of Breath   Gadobenate Anaphylaxis   Other Swelling    Cat gut sutures Cats and dogs                Derma bond- intolerance- itching- tolerates with Benadryl       Cymbalta [Duloxetine Hcl] Diarrhea   Amoxicillin Nausea And Vomiting and Rash   Macrodantin [Nitrofurantoin] Rash    Medications Prior to Admission  Medication Sig Dispense Refill   acetaminophen (TYLENOL) 500 MG tablet Take 1,000 mg by mouth every 6 (six) hours as needed for moderate pain.     Calcium-Vitamin D-Vitamin K (CALCIUM + D) [phone] MG-UNT-MCG CHEW Chew 1 tablet by mouth daily.     cholecalciferol (VITAMIN D) 25 MCG (1000 UNIT) tablet Take 1,000 Units by mouth daily.      CRANBERRY PO Take 2 tablets by mouth in the morning and at bedtime.     Cyanocobalamin (VITAMIN B-12) 5000 MCG SUBL Place 5,000 mcg under the tongue daily.      famotidine (PEPCID) 20 MG tablet Take 20 mg by mouth daily.     gabapentin (NEURONTIN) 300 MG capsule Take 1 tablet in the morning and 4 tablets at bedtime 450 capsule 3   Glucos-Chondroit-Hyaluron-MSM (GLUCOSAMINE CHONDROITIN JOINT PO) Take 1 tablet by mouth daily.     hydrochlorothiazide (HYDRODIURIL) 12.5 MG tablet Take 12.5 mg by mouth daily.     Lactobacillus-Inulin (CULTURELLE DIGESTIVE DAILY PO) Take 1 tablet by mouth daily.     levothyroxine (SYNTHROID, LEVOTHROID) 25 MCG  tablet Take 25 mcg by mouth See admin instructions. Take 25 mcg by mouth on Monday, Wednesday, Fri,day and Saturday only.     loratadine (CLARITIN) 10 MG tablet Take 10 mg by mouth daily as needed for allergies.      Menatetrenone (VITAMIN K2) 100 MCG TABS Take 100 mcg by mouth daily.     methocarbamol (ROBAXIN) 500 MG tablet Take 1 tablet (500 mg total) by mouth 4 (four)  times daily. (Patient taking differently: Take 500 mg by mouth every 6 (six) hours as needed for muscle spasms.) 45 tablet 0   Multiple Vitamins-Minerals (PRESERVISION AREDS 2) CAPS Take 1 tablet by mouth daily.     nebivolol (BYSTOLIC) 5 MG tablet Take 5 mg by mouth daily.     Polyethyl Glyc-Propyl Glyc PF (SYSTANE ULTRA PF) 0.4-0.3 % SOLN Place 1 drop into both eyes in the morning.     potassium chloride (KLOR-CON) 10 MEQ tablet Take 1 tablet (10 mEq total) by mouth daily. Take 2 tabs for one week then change to once daily (Patient taking differently: Take 10 mEq by mouth 2 (two) times daily.) 60 tablet 2   pyridOXINE (VITAMIN B-6) 100 MG tablet Take 100 mg by mouth daily.     vitamin C (ASCORBIC ACID) 500 MG tablet Take 500 mg by mouth daily.     HYDROcodone-acetaminophen (NORCO/VICODIN) 5-325 MG tablet Take 1 tablet by mouth every 4 (four) hours as needed for moderate pain. (Patient not taking: Reported on 05/30/2022) 20 tablet 0   Magnesium Oxide 200 MG TABS Take 200 mg by mouth daily.     naproxen sodium (ALEVE) 220 MG tablet Take 220 mg by mouth daily as needed (pain). (Patient not taking: Reported on 05/30/2022)     Omega-3 Fatty Acids (FISH OIL PO) Take 1 capsule by mouth daily. (Patient not taking: Reported on 05/30/2022)      No results found for this or any previous visit (from the past 48 hour(s)). No results found.  Review of Systems  Musculoskeletal:  Positive for back pain.  Neurological:  Positive for weakness and numbness.    Blood pressure (!) 162/90, pulse 82, temperature (!) 97.5 F (36.4 C), temperature  source Oral, resp. rate 18, height '4\' 10"'$  (1.473 m), weight 70.3 kg, SpO2 95 %. Physical Exam HENT:     Head: Normocephalic.     Right Ear: Tympanic membrane normal.     Nose: Nose normal.     Mouth/Throat:     Mouth: Mucous membranes are moist.  Cardiovascular:     Rate and Rhythm: Normal rate.  Pulmonary:     Effort: Pulmonary effort is normal.  Abdominal:     General: Abdomen is flat.  Musculoskeletal:        General: Normal range of motion.  Skin:    General: Skin is warm.  Neurological:     Mental Status: She is alert.     Comments: Strength is 5 out of 5 iliopsoas, quads, hamstrings, gastrocs, tibialis, and EHL.      Assessment/Plan 81 year old presents for removal hardware vertebral augmentation  Elaina Hoops, MD 06/13/2022, 2:34 PM

## 2022-06-13 NOTE — Progress Notes (Signed)
PHARMACIST - PHYSICIAN ORDER COMMUNICATION  CONCERNING: P&T Medication Policy on Herbal Medications  DESCRIPTION:  This patient's order for:  vitamin K2 100 mcg  has been noted.  This product(s) is classified as an "herbal" or natural product. Due to a lack of definitive safety studies or FDA approval, nonstandard manufacturing practices, plus the potential risk of unknown drug-drug interactions while on inpatient medications, the Pharmacy and Therapeutics Committee does not permit the use of "herbal" or natural products of this type within [facility].   ACTION TAKEN: The pharmacy department is unable to verify this order at this time and your patient has been informed of this safety policy. Please reevaluate patient's clinical condition at discharge and address if the herbal or natural product(s) should be resumed at that time.  Jeneen Rinks, Pharm.D PGY1 Pharmacy Resident 06/13/2022 6:17 PM

## 2022-06-13 NOTE — Op Note (Signed)
Preoperative diagnosis: T10 compression fracture and loose T10 screws  Postoperative diagnosis: Same  Procedure: Exploration of fusion removal of hardware with cutting the rod between T10 and T11 removal of bilateral T10 Stryker screws  2.  Vertebral augmentation, vertebroplasty,  utilizing the Kyphon cement with placement of cement through 2 previous pedicle holes created from the removed screws  Surgeon: Dominica Severin Evamaria Detore  Assistant: Nash Shearer  Anesthesia: General  EBL: Minimal  HPI: 82 year old female with right-sided back pain over the last several months work-up has revealed compression fractures in her lower thoracic spine symptomatic 1 was T10 where that she had loosened her right T10 and left T10 screw with the right T10 screw migrating of the disc base.  Due to patient progression of clinical syndrome imaging findings and failed conservative treatment I did recommended removal of bilateral T10 screws and subsequent vertebral augmentation for the compression fracture with kyphon.  I extensively went over the risks and benefits of that procedure with her as well as perioperative course expectations of outcome and alternatives to surgery and she understood and agreed to proceed forward.  Operative procedure: Patient was brought into the OR was [place] general anesthesia positioned prone the Wilson frame her back was prepped and draped in routine sterile fashion.  Superior aspect of roll incision was infiltrated with 10 cc lidocaine with epi and a midline incision was made and Bovie cautery was used to dissect set through the scar tissue exposing the hardware and the screws at T10 and T11.  I then cut the rod below the T10 screw removed the knots both screws were loose I then evaluated the pedicle holes and then fluoroscopy was brought into the wound and under fluoroscopy I injected Kyphon cement through the previous made pedicle holes.  The right T10 screw had migrated disc base I tried to  redirect this inferiorly and as the cement count of expanded I did dripped into the disc base a little bit but it was nowhere near the spinal canal.  The left side screw was competent and cement went in the left side without event.  We looked at it with both AP and lateral fluoroscopy to confirm good position.  And due to the migration I did not inject any more cement on the right side I put a little bit more on the left side and then we copiously irrigated the wound I waxed off the holes and closed the wound in layers with interrupted Vicryl and a running 4 subcuticular.  Dermabond benzoin Steri-Strips and a sterile dressing was applied patient recovery in stable condition.  At the end the case all needle counts and sponge counts were correct.

## 2022-06-13 NOTE — Anesthesia Postprocedure Evaluation (Signed)
Anesthesia Post Note  Patient: Audrey [name]  Procedure(s) Performed: Bilateral Thoracic Ten- Removal of Screws (Back) Vertobroplasty Thoracic Ten     Patient location during evaluation: PACU Anesthesia Type: General Level of consciousness: awake and alert Pain management: pain level controlled Vital Signs Assessment: post-procedure vital signs reviewed and stable Respiratory status: spontaneous breathing, nonlabored ventilation, respiratory function stable and patient connected to nasal cannula oxygen Cardiovascular status: blood pressure returned to baseline and stable Postop Assessment: no apparent nausea or vomiting Anesthetic complications: no   No notable events documented.  Last Vitals:  Vitals:   06/13/22 1714 06/13/22 1729  BP: (!) 124/99 (!) 146/94  Pulse: 72 70  Resp: 20 11  Temp:  36.8 C  SpO2: 95% 99%    Last Pain:  Vitals:   06/13/22 1729  TempSrc:   PainSc: 4                  Dashae Wilcher P Evelyn Moch

## 2022-06-13 NOTE — Transfer of Care (Signed)
Immediate Anesthesia Transfer of Care Note  Patient: Audrey [name]  Procedure(s) Performed: Bilateral Thoracic Ten- Removal of Screws (Back) Vertobroplasty Thoracic Ten  Patient Location: PACU  Anesthesia Type:General  Level of Consciousness: drowsy  Airway & Oxygen Therapy: Patient Spontanous Breathing  Post-op Assessment: Report given to RN and Post -op Vital signs reviewed and stable  Post vital signs: Reviewed and stable  Last Vitals:  Vitals Value Taken Time  BP 167/75 06/13/22 1700  Temp 36.8 C 06/13/22 1659  Pulse 80 06/13/22 1703  Resp 18 06/13/22 1703  SpO2 93 % 06/13/22 1703  Vitals shown include unvalidated device data.  Last Pain:  Vitals:   06/13/22 1659  TempSrc:   PainSc: Asleep         Complications: No notable events documented.

## 2022-06-13 NOTE — Anesthesia Procedure Notes (Signed)
Procedure Name: Intubation Date/Time: 06/13/2022 3:39 PM  Performed by: Georgia Duff, CRNAPre-anesthesia Checklist: Patient identified, Emergency Drugs available, Suction available and Patient being monitored Patient Re-evaluated:Patient Re-evaluated prior to induction Oxygen Delivery Method: Circle System Utilized Preoxygenation: Pre-oxygenation with 100% oxygen Induction Type: IV induction Ventilation: Mask ventilation without difficulty Laryngoscope Size: Miller and 2 Grade View: Grade I Tube type: Oral Tube size: 7.0 mm Number of attempts: 1 Airway Equipment and Method: Stylet and Oral airway Placement Confirmation: ETT inserted through vocal cords under direct vision, positive ETCO2 and breath sounds checked- equal and bilateral Secured at: 21 cm Tube secured with: Tape Dental Injury: Teeth and Oropharynx as per pre-operative assessment

## 2022-06-14 ENCOUNTER — Encounter (HOSPITAL_COMMUNITY): Payer: Self-pay | Admitting: Neurosurgery

## 2022-06-14 MED ORDER — METHOCARBAMOL 500 MG PO TABS
500.0000 mg | ORAL_TABLET | Freq: Four times a day (QID) | ORAL | Status: DC | PRN
  Administered 2022-06-14: 500 mg via ORAL
  Filled 2022-06-14 (×2): qty 1

## 2022-06-14 MED ORDER — METHYLPREDNISOLONE 4 MG PO TBPK: ORAL_TABLET | ORAL | 0 refills | Status: DC

## 2022-06-14 MED ORDER — DEXAMETHASONE SODIUM PHOSPHATE 10 MG/ML IJ SOLN
8.0000 mg | Freq: Once | INTRAMUSCULAR | Status: AC
Start: 2022-06-14 — End: 2022-06-14
  Administered 2022-06-14: 8 mg via INTRAMUSCULAR
  Filled 2022-06-14: qty 1

## 2022-06-14 MED ORDER — METHOCARBAMOL 500 MG PO TABS
500.0000 mg | ORAL_TABLET | Freq: Four times a day (QID) | ORAL | 1 refills | Status: DC | PRN
Start: 2022-06-14 — End: 2022-10-25

## 2022-06-14 MED FILL — Thrombin (Recombinant) For Soln 5000 Unit: CUTANEOUS | Qty: 2 | Status: AC

## 2022-06-14 NOTE — Progress Notes (Signed)
Patient alert and oriented, voiding adequately, skin clean, dry and intact without evidence of skin break down, or symptoms of complications - no redness or edema noted, only slight tenderness at site.  Patient states pain is manageable at time of discharge. Patient has an appointment with MD in 2 weeks 

## 2022-06-14 NOTE — Progress Notes (Signed)
Occupational Therapy Evaluation Patient Details Name: Audrey [name] MRN: [id number] DOB: 1941/04/14 Today's Date: 06/14/2022   History of Present Illness Pt is a 81yo F s/p removal of bilateral T10 screws and T10 vertebroplasty on 7/19. PMH: breast cancer, fibromyaglia, GERD, HTN, hypothyroidism, and multiple back surgeries.   Clinical Impression   PTA, pt lived with her husband and was independent with ADL. Pt with skill to recall 3/3 spinal precautions, with 1-2 reminders to adhere to precautions during session. Pt educated and demonstrating LB dressing, tub/shower transfer, toilet transfer, and oral care with mod I during session. All education provided. Recommend d/c home with no follow up OT. OT to sign off. Re-consult if change in status.      Recommendations for follow up therapy are one component of a multi-disciplinary discharge planning process, led by the attending physician.  Recommendations may be updated based on patient status, additional functional criteria and insurance authorization.   Follow Up Recommendations  No OT follow up    Assistance Recommended at Discharge Intermittent Supervision/Assistance  Patient can return home with the following Assistance with cooking/housework;Assist for transportation    Functional Status Assessment     Equipment Recommendations  None recommended by OT    Recommendations for Other Services       Precautions / Restrictions Precautions Precautions: Back Precaution Booklet Issued: Yes (comment) Precaution Comments: reviewed precautions for comfort Restrictions Weight Bearing Restrictions: No      Mobility Bed Mobility               General bed mobility comments: Seated at EOB upon arrival. Pt reporting extensive pain in R ribs with being in bed, and reporting that she will sleep in recliner at home.    Transfers Overall transfer level: Modified independent Equipment used: None               General  transfer comment: Mod I for increased time and occasional use of cane      Balance Overall balance assessment: Mild deficits observed, not formally tested                                         ADL either performed or assessed with clinical judgement   ADL Overall ADL's : Modified independent                                       General ADL Comments: Pt performing LB dressing, tub/shower transfer, toilet transfer, oral care, and functional mobility within precations.     Vision   Vision Assessment?: No apparent visual deficits     Perception     Praxis      Pertinent Vitals/Pain Pain Assessment Pain Assessment: Faces Faces Pain Scale: Hurts little more Pain Location: R ribs Pain Descriptors / Indicators: Discomfort, Constant, Guarding, Sharp Pain Intervention(s): Limited activity within patient's tolerance, Monitored during session     Hand Dominance Right   Extremity/Trunk Assessment Upper Extremity Assessment Upper Extremity Assessment: Overall WFL for tasks assessed   Lower Extremity Assessment Lower Extremity Assessment: Overall WFL for tasks assessed   Cervical / Trunk Assessment Cervical / Trunk Assessment: Back Surgery   Communication Communication Communication: No difficulties   Cognition Arousal/Alertness: Awake/alert Behavior During Therapy: WFL for tasks assessed/performed Overall Cognitive Status: Within Functional Limits for tasks  assessed                                 General Comments: Pt with skill to recall precautions. Benefitting from occasional verbal cues to follow precautions to avoid bending during session, however, overall very aware of precautions.     General Comments  VSS.    Exercises     Shoulder Instructions      Home Living Family/patient expects to be discharged to:: Private residence Living Arrangements: Spouse/significant other Available Help at Discharge:  Family;Available 24 hours/day Type of Home: House Home Access: Level entry     Home Layout: Multi-level;Able to live on main level with bedroom/bathroom Alternate Level Stairs-Number of Steps: 12-14 winding staircase to sewing room Alternate Level Stairs-Rails: Right;Left Bathroom Shower/Tub: Tub/shower unit;Walk-in shower;Tub only   Bathroom Toilet: Handicapped height     Home Equipment: Conservation officer, nature (2 wheels);Cane - single point;Grab bars - tub/shower   Additional Comments: spouse is supportive. Pt reports she feels comfortable going home.      Prior Functioning/Environment Prior Level of Function : Independent/Modified Independent             Mobility Comments: uses cane in community ADLs Comments: Pt reports she performs ADL independently.        OT Problem List: Decreased strength;Decreased activity tolerance;Impaired balance (sitting and/or standing)      OT Treatment/Interventions:      OT Goals(Current goals can be found in the care plan section) Acute Rehab OT Goals Patient Stated Goal: no pain OT Goal Formulation: With patient  OT Frequency:      Co-evaluation              AM-PAC OT "6 Clicks" Daily Activity     Outcome Measure Help from another person eating meals?: None Help from another person taking care of personal grooming?: None Help from another person toileting, which includes using toliet, bedpan, or urinal?: None Help from another person bathing (including washing, rinsing, drying)?: None Help from another person to put on and taking off regular upper body clothing?: None Help from another person to put on and taking off regular lower body clothing?: None 6 Click Score: 24   End of Session Equipment Utilized During Treatment: Gait belt Nurse Communication: Mobility status  Activity Tolerance: Patient tolerated treatment well Patient left: in bed;with call bell/phone within reach  OT Visit Diagnosis: Unsteadiness on feet  (R26.81);Muscle weakness (generalized) (M62.81);Pain Pain - part of body:  (R ribs)                Time: 4268-3419 OT Time Calculation (min): 18 min Charges:  OT General Charges $OT Visit: 1 Visit OT Evaluation $OT Eval Low Complexity: 1 Low  Audrey [name], OTR/L [facility] Acute Rehabilitation Office: [phone]   Audrey [name] 06/14/2022, 9:50 AM

## 2022-06-14 NOTE — Evaluation (Signed)
Physical Therapy Evaluation & Discharge  Patient Details Name: Audrey [name] MRN: [id number] DOB: 08/01/1941 Today's Date: 06/14/2022  History of Present Illness  Pt is a 81yo F s/p removal of bilateral T10 screws and T10 vertebroplasty on 7/19. PMH: breast cancer, fibromyaglia, GERD, HTN, hypothyroidism, and multiple back surgeries.  Clinical Impression  PTA, pt was independent with all ADLs/IADLs and using a cane for community ambulation. Pt is currently independent with transfers and supervision/min guard for ambulation of 122f with cane. Mobility limited due to reports of increased R rib pain with ambulation. Pt educated on back precautions for comfort, safe car transfers, and progression of mobility upon discharge. Pt with no further questions/concerns at this time. PT will sign off.     Recommendations for follow up therapy are one component of a multi-disciplinary discharge planning process, led by the attending physician.  Recommendations may be updated based on patient status, additional functional criteria and insurance authorization.  Follow Up Recommendations Follow physician's recommendations for discharge plan and follow up therapies      Assistance Recommended at Discharge PRN  Patient can return home with the following  Assistance with cooking/housework;Assist for transportation    Equipment Recommendations None recommended by PT  Recommendations for Other Services       Functional Status Assessment Patient has had a recent decline in their functional status and demonstrates the ability to make significant improvements in function in a reasonable and predictable amount of time.     Precautions / Restrictions Precautions Precautions: Back Precaution Booklet Issued: Yes (comment) Precaution Comments: reviewed precautions for comfort Restrictions Weight Bearing Restrictions: No      Mobility  Bed Mobility               General bed mobility comments:  seated at EOB upon arrival    Transfers Overall transfer level: Independent Equipment used: None                    Ambulation/Gait Ambulation/Gait assistance: Supervision, Min guard Gait Distance (Feet): 150 Feet Assistive device: None, Straight cane Gait Pattern/deviations: Step-through pattern, Decreased stride length, Trunk flexed Gait velocity: decreased Gait velocity interpretation: 1.31 - 2.62 ft/sec, indicative of limited community ambulator   General Gait Details: initally ambulating without AD but given cane due to reported increased R rib pain. PT suggested use of cane in L hand to relieve pressure from R side but pt stating R hand is more comfortable for her. Distance limited by increased rib pain. Occasional min guard required with increase in pain  Stairs            Wheelchair Mobility    Modified Rankin (Stroke Patients Only)       Balance Overall balance assessment: Mild deficits observed, not formally tested                                           Pertinent Vitals/Pain Pain Assessment Pain Assessment: Faces Faces Pain Scale: Hurts even more Pain Location: R ribs Pain Descriptors / Indicators: Constant, Grimacing, Guarding Pain Intervention(s): Limited activity within patient's tolerance, Monitored during session    Home Living Family/patient expects to be discharged to:: Private residence Living Arrangements: Spouse/significant other Available Help at Discharge: Family;Available 24 hours/day Type of Home: House Home Access: Level entry     Alternate Level Stairs-Number of Steps: 12-14 winding staircase to sewing room  Home Layout: Multi-level;Able to live on main level with bedroom/bathroom Home Equipment: Rolling Walker (2 wheels);Cane - single point;Grab bars - tub/shower Additional Comments: spouse is supportive. Pt reports she feels comfortable going home.    Prior Function Prior Level of Function :  Independent/Modified Independent             Mobility Comments: uses cane in community ADLs Comments: Pt reports she performs ADL independently.     Hand Dominance   Dominant Hand: Right    Extremity/Trunk Assessment   Upper Extremity Assessment Upper Extremity Assessment: Overall WFL for tasks assessed    Lower Extremity Assessment Lower Extremity Assessment: Overall WFL for tasks assessed    Cervical / Trunk Assessment Cervical / Trunk Assessment: Back Surgery  Communication   Communication: No difficulties  Cognition Arousal/Alertness: Awake/alert Behavior During Therapy: WFL for tasks assessed/performed Overall Cognitive Status: Within Functional Limits for tasks assessed                                          General Comments General comments (skin integrity, edema, etc.): VSS.    Exercises     Assessment/Plan    PT Assessment Patient does not need any further PT services  PT Problem List         PT Treatment Interventions      PT Goals (Current goals can be found in the Care Plan section)  Acute Rehab PT Goals Patient Stated Goal: to reduce pain and return home PT Goal Formulation: All assessment and education complete, DC therapy    Frequency       Co-evaluation               AM-PAC PT "6 Clicks" Mobility  Outcome Measure Help needed turning from your back to your side while in a flat bed without using bedrails?: None Help needed moving from lying on your back to sitting on the side of a flat bed without using bedrails?: None Help needed moving to and from a bed to a chair (including a wheelchair)?: None Help needed standing up from a chair using your arms (e.g., wheelchair or bedside chair)?: None Help needed to walk in hospital room?: A Little Help needed climbing 3-5 steps with a railing? : A Little 6 Click Score: 22    End of Session Equipment Utilized During Treatment: Gait belt Activity Tolerance: Patient  tolerated treatment well;Patient limited by pain Patient left:  (in bathroom with OT) Nurse Communication: Mobility status PT Visit Diagnosis: Other abnormalities of gait and mobility (R26.89);Difficulty in walking, not elsewhere classified (R26.2)    Time: 4944-9675 PT Time Calculation (min) (ACUTE ONLY): 18 min   Charges:   PT Evaluation $PT Eval Low Complexity: [place], SPT Acute Rehabilitation Services  Office: [phone]   Mackie Pai 06/14/2022, 10:00 AM

## 2022-06-14 NOTE — Discharge Summary (Signed)
Physician Discharge Summary  Patient ID: Audrey [name] MRN: [id number] DOB/AGE: 1941/07/18 81 y.o. Estimated body mass index is 32.4 kg/m as calculated from the following:   Height as of this encounter: '4\' 10"'$  (1.473 m).   Weight as of this encounter: 70.3 kg.   Admit date: 06/13/2022 Discharge date: 06/14/2022  Admission Diagnoses: Compression fracture loose hardware  Discharge Diagnoses: Same Principal Problem:   Compression fracture of body of thoracic vertebra [facility])   Discharged Condition: good  Hospital Course: Patient was admitted to hospital underwent removal of T10 screws with vertebroplasty of T10 postop patient did very well when covering the floor on the floor was ambulating and voiding spontaneously tolerating regular diet and stable for discharge home.  Consults: Significant Diagnostic Studies: Treatments: Removal of bilateral T10 screws vertebroplasty T10 Discharge Exam: Blood pressure (!) 149/77, pulse 78, temperature 97.8 F (36.6 C), temperature source Oral, resp. rate 18, height '4\' 10"'$  (1.473 m), weight 70.3 kg, SpO2 95 %. Strength 5 out of 5 and clean dry and intact  Disposition: Home   Allergies as of 06/14/2022       Reactions   Contrast Media [iodinated Contrast Media] Shortness Of Breath   Gadobenate Anaphylaxis   Other Swelling   Cat gut sutures Cats and dogs               Derma bond- intolerance- itching- tolerates with Benadryl       Cymbalta [duloxetine Hcl] Diarrhea   Amoxicillin Nausea And Vomiting, Rash   Macrodantin [nitrofurantoin] Rash        Medication List     TAKE these medications    acetaminophen 500 MG tablet Commonly known as: TYLENOL Take 1,000 mg by mouth every 6 (six) hours as needed for moderate pain.   Calcium + D [phone] MG-UNT-MCG Chew Generic drug: Calcium-Vitamin D-Vitamin K Chew 1 tablet by mouth daily.   cholecalciferol 25 MCG (1000 UNIT) tablet Commonly known as: VITAMIN D Take 1,000 Units by  mouth daily.   CRANBERRY PO Take 2 tablets by mouth in the morning and at bedtime.   CULTURELLE DIGESTIVE DAILY PO Take 1 tablet by mouth daily.   famotidine 20 MG tablet Commonly known as: PEPCID Take 20 mg by mouth daily.   FISH OIL PO Take 1 capsule by mouth daily.   gabapentin 300 MG capsule Commonly known as: NEURONTIN Take 1 tablet in the morning and 4 tablets at bedtime   GLUCOSAMINE CHONDROITIN JOINT PO Take 1 tablet by mouth daily.   hydrochlorothiazide 12.5 MG tablet Commonly known as: HYDRODIURIL Take 12.5 mg by mouth daily.   HYDROcodone-acetaminophen 5-325 MG tablet Commonly known as: NORCO/VICODIN Take 1 tablet by mouth every 4 (four) hours as needed for moderate pain.   levothyroxine 25 MCG tablet Commonly known as: SYNTHROID Take 25 mcg by mouth See admin instructions. Take 25 mcg by mouth on Monday, Wednesday, Fri,day and Saturday only.   loratadine 10 MG tablet Commonly known as: CLARITIN Take 10 mg by mouth daily as needed for allergies.   Magnesium Oxide -Mg Supplement 200 MG Tabs Take 200 mg by mouth daily.   methocarbamol 500 MG tablet Commonly known as: Robaxin Take 1 tablet (500 mg total) by mouth 4 (four) times daily. What changed:  when to take this reasons to take this   methocarbamol 500 MG tablet Commonly known as: ROBAXIN Take 1 tablet (500 mg total) by mouth every 6 (six) hours as needed for muscle spasms. What changed: You were already  taking a medication with the same name, and this prescription was added. Make sure you understand how and when to take each.   methylPREDNISolone 4 MG Tbpk tablet Commonly known as: MEDROL DOSEPAK As directed   naproxen sodium 220 MG tablet Commonly known as: ALEVE Take 220 mg by mouth daily as needed (pain).   nebivolol 5 MG tablet Commonly known as: BYSTOLIC Take 5 mg by mouth daily.   potassium chloride 10 MEQ tablet Commonly known as: KLOR-CON Take 1 tablet (10 mEq total) by mouth  daily. Take 2 tabs for one week then change to once daily What changed:  when to take this additional instructions   PreserVision AREDS 2 Caps Take 1 tablet by mouth daily.   pyridOXINE 100 MG tablet Commonly known as: VITAMIN B-6 Take 100 mg by mouth daily.   Systane Ultra PF 0.4-0.3 % Soln Generic drug: Polyethyl Glyc-Propyl Glyc PF Place 1 drop into both eyes in the morning.   Vitamin B-12 5000 MCG Subl Place 5,000 mcg under the tongue daily.   vitamin C 500 MG tablet Commonly known as: ASCORBIC ACID Take 500 mg by mouth daily.   Vitamin K2 100 MCG Tabs Take 100 mcg by mouth daily.         Signed: Elaina Hoops 06/14/2022, 7:58 AM

## 2022-06-14 NOTE — Discharge Instructions (Signed)
Wound Care  Keep the incision clean and dry remove the outer dressing in 3 days Do not put any creams, lotions, or ointments on incision. Leave steri-strips on back.  They will fall off by themselves.  Activity Walk each and every day, increasing distance each day. No lifting greater than 5 lbs.  No lifting no bending no twisting no driving or riding a car unless coming back and forth to see me.  Diet Resume your normal diet.   Call Your Doctor If Any of These Occur Redness, drainage, or swelling at the wound.  Temperature greater than 101 degrees. Severe pain not relieved by pain medication. Incision starts to come apart. Follow Up Appt Call [phone]) for problems.  If you have any hardware placed in your spine, you will need an x-ray before your appointment.

## 2022-07-19 DIAGNOSIS — S22000A Wedge compression fracture of unspecified thoracic vertebra, initial encounter for closed fracture: Secondary | ICD-10-CM | POA: Diagnosis not present

## 2022-07-20 ENCOUNTER — Other Ambulatory Visit: Payer: Self-pay | Admitting: Neurosurgery

## 2022-07-20 DIAGNOSIS — S22000A Wedge compression fracture of unspecified thoracic vertebra, initial encounter for closed fracture: Secondary | ICD-10-CM

## 2022-07-23 ENCOUNTER — Ambulatory Visit
Admission: RE | Admit: 2022-07-23 | Discharge: 2022-07-23 | Disposition: A | Discharge status: Home / Self Care | Payer: PPO | Source: Ambulatory Visit | Attending: Neurosurgery | Admitting: Neurosurgery

## 2022-07-23 ENCOUNTER — Encounter: Payer: Self-pay | Admitting: Nurse Practitioner

## 2022-07-23 DIAGNOSIS — M40204 Unspecified kyphosis, thoracic region: Secondary | ICD-10-CM | POA: Diagnosis not present

## 2022-07-23 DIAGNOSIS — M549 Dorsalgia, unspecified: Secondary | ICD-10-CM | POA: Diagnosis not present

## 2022-07-23 DIAGNOSIS — S22000A Wedge compression fracture of unspecified thoracic vertebra, initial encounter for closed fracture: Secondary | ICD-10-CM

## 2022-07-31 DIAGNOSIS — Z6831 Body mass index (BMI) 31.0-31.9, adult: Secondary | ICD-10-CM | POA: Diagnosis not present

## 2022-07-31 DIAGNOSIS — S22000A Wedge compression fracture of unspecified thoracic vertebra, initial encounter for closed fracture: Secondary | ICD-10-CM | POA: Diagnosis not present

## 2022-08-06 DIAGNOSIS — G588 Other specified mononeuropathies: Secondary | ICD-10-CM | POA: Diagnosis not present

## 2022-08-15 DIAGNOSIS — R0781 Pleurodynia: Secondary | ICD-10-CM | POA: Diagnosis not present

## 2022-08-15 DIAGNOSIS — M8008XD Age-related osteoporosis with current pathological fracture, vertebra(e), subsequent encounter for fracture with routine healing: Secondary | ICD-10-CM | POA: Diagnosis not present

## 2022-08-20 DIAGNOSIS — D3132 Benign neoplasm of left choroid: Secondary | ICD-10-CM | POA: Diagnosis not present

## 2022-08-20 DIAGNOSIS — Z961 Presence of intraocular lens: Secondary | ICD-10-CM | POA: Diagnosis not present

## 2022-08-20 DIAGNOSIS — H52203 Unspecified astigmatism, bilateral: Secondary | ICD-10-CM | POA: Diagnosis not present

## 2022-08-20 DIAGNOSIS — H353111 Nonexudative age-related macular degeneration, right eye, early dry stage: Secondary | ICD-10-CM | POA: Diagnosis not present

## 2022-08-26 HISTORY — PX: OTHER SURGICAL HISTORY: SHX169

## 2022-08-28 DIAGNOSIS — M5136 Other intervertebral disc degeneration, lumbar region: Secondary | ICD-10-CM | POA: Diagnosis not present

## 2022-08-28 DIAGNOSIS — M4850XD Collapsed vertebra, not elsewhere classified, site unspecified, subsequent encounter for fracture with routine healing: Secondary | ICD-10-CM | POA: Diagnosis not present

## 2022-08-28 DIAGNOSIS — E559 Vitamin D deficiency, unspecified: Secondary | ICD-10-CM | POA: Diagnosis not present

## 2022-08-28 DIAGNOSIS — Z0000 Encounter for general adult medical examination without abnormal findings: Secondary | ICD-10-CM | POA: Diagnosis not present

## 2022-08-28 DIAGNOSIS — E7800 Pure hypercholesterolemia, unspecified: Secondary | ICD-10-CM | POA: Diagnosis not present

## 2022-08-28 DIAGNOSIS — Z23 Encounter for immunization: Secondary | ICD-10-CM | POA: Diagnosis not present

## 2022-08-28 DIAGNOSIS — R269 Unspecified abnormalities of gait and mobility: Secondary | ICD-10-CM | POA: Diagnosis not present

## 2022-08-28 DIAGNOSIS — I700 Atherosclerosis of aorta: Secondary | ICD-10-CM | POA: Diagnosis not present

## 2022-08-28 DIAGNOSIS — E039 Hypothyroidism, unspecified: Secondary | ICD-10-CM | POA: Diagnosis not present

## 2022-08-28 DIAGNOSIS — M797 Fibromyalgia: Secondary | ICD-10-CM | POA: Diagnosis not present

## 2022-08-28 DIAGNOSIS — Z853 Personal history of malignant neoplasm of breast: Secondary | ICD-10-CM | POA: Diagnosis not present

## 2022-08-28 DIAGNOSIS — M810 Age-related osteoporosis without current pathological fracture: Secondary | ICD-10-CM | POA: Diagnosis not present

## 2022-08-28 DIAGNOSIS — I10 Essential (primary) hypertension: Secondary | ICD-10-CM | POA: Diagnosis not present

## 2022-09-07 DIAGNOSIS — G588 Other specified mononeuropathies: Secondary | ICD-10-CM | POA: Diagnosis not present

## 2022-09-12 DIAGNOSIS — M8008XD Age-related osteoporosis with current pathological fracture, vertebra(e), subsequent encounter for fracture with routine healing: Secondary | ICD-10-CM | POA: Diagnosis not present

## 2022-09-26 DIAGNOSIS — G588 Other specified mononeuropathies: Secondary | ICD-10-CM | POA: Diagnosis not present

## 2022-10-10 ENCOUNTER — Other Ambulatory Visit: Payer: Self-pay

## 2022-10-10 DIAGNOSIS — E039 Hypothyroidism, unspecified: Secondary | ICD-10-CM | POA: Diagnosis not present

## 2022-10-10 DIAGNOSIS — M810 Age-related osteoporosis without current pathological fracture: Secondary | ICD-10-CM | POA: Diagnosis not present

## 2022-10-10 DIAGNOSIS — Z853 Personal history of malignant neoplasm of breast: Secondary | ICD-10-CM | POA: Diagnosis not present

## 2022-10-10 DIAGNOSIS — Z170 Estrogen receptor positive status [ER+]: Secondary | ICD-10-CM

## 2022-10-10 DIAGNOSIS — R194 Change in bowel habit: Secondary | ICD-10-CM | POA: Diagnosis not present

## 2022-10-11 ENCOUNTER — Encounter: Payer: Self-pay | Admitting: Hematology

## 2022-10-11 ENCOUNTER — Other Ambulatory Visit: Payer: Self-pay

## 2022-10-11 ENCOUNTER — Inpatient Hospital Stay: Payer: PPO

## 2022-10-11 ENCOUNTER — Inpatient Hospital Stay: Payer: PPO | Attending: Hematology | Admitting: Hematology

## 2022-10-11 VITALS — BP 164/86 | HR 75 | Temp 97.7°F | Resp 18 | Ht <= 58 in | Wt 159.9 lb

## 2022-10-11 DIAGNOSIS — Z170 Estrogen receptor positive status [ER+]: Secondary | ICD-10-CM

## 2022-10-11 DIAGNOSIS — M810 Age-related osteoporosis without current pathological fracture: Secondary | ICD-10-CM | POA: Insufficient documentation

## 2022-10-11 DIAGNOSIS — E039 Hypothyroidism, unspecified: Secondary | ICD-10-CM | POA: Diagnosis not present

## 2022-10-11 DIAGNOSIS — K219 Gastro-esophageal reflux disease without esophagitis: Secondary | ICD-10-CM | POA: Diagnosis not present

## 2022-10-11 DIAGNOSIS — Z79810 Long term (current) use of selective estrogen receptor modulators (SERMs): Secondary | ICD-10-CM | POA: Insufficient documentation

## 2022-10-11 DIAGNOSIS — I10 Essential (primary) hypertension: Secondary | ICD-10-CM | POA: Insufficient documentation

## 2022-10-11 DIAGNOSIS — R197 Diarrhea, unspecified: Secondary | ICD-10-CM | POA: Diagnosis not present

## 2022-10-11 DIAGNOSIS — Z923 Personal history of irradiation: Secondary | ICD-10-CM | POA: Diagnosis not present

## 2022-10-11 DIAGNOSIS — Z79899 Other long term (current) drug therapy: Secondary | ICD-10-CM | POA: Insufficient documentation

## 2022-10-11 DIAGNOSIS — M797 Fibromyalgia: Secondary | ICD-10-CM | POA: Insufficient documentation

## 2022-10-11 DIAGNOSIS — C50412 Malignant neoplasm of upper-outer quadrant of left female breast: Secondary | ICD-10-CM | POA: Diagnosis not present

## 2022-10-11 LAB — CMP (CANCER CENTER ONLY)
ALT: 15 U/L (ref 0–44)
AST: 20 U/L (ref 15–41)
Albumin: 4.3 g/dL (ref 3.5–5.0)
Alkaline Phosphatase: 78 U/L (ref 38–126)
Anion gap: 6 (ref 5–15)
BUN: 12 mg/dL (ref 8–23)
CO2: 32 mmol/L (ref 22–32)
Calcium: 9.7 mg/dL (ref 8.9–10.3)
Chloride: 96 mmol/L — ABNORMAL LOW (ref 98–111)
Creatinine: 0.84 mg/dL (ref 0.44–1.00)
GFR, Estimated: >60 mL/min (ref >60)
Glucose, Bld: 101 mg/dL — ABNORMAL HIGH (ref 70–99)
Potassium: 4.3 mmol/L (ref 3.5–5.1)
Sodium: 134 mmol/L — ABNORMAL LOW (ref 135–145)
Total Bilirubin: 0.7 mg/dL (ref 0.3–1.2)
Total Protein: 7.1 g/dL (ref 6.5–8.1)

## 2022-10-11 NOTE — Progress Notes (Addendum)
[place]   [phone] [phone]   Clinic Follow up Note   Patient Care Team: Mckinley Jewel, MD as PCP - General (Internal Medicine) Sueanne Margarita, MD as Consulting Physician (Cardiology) Jodi Marble, MD as Consulting Physician (Otolaryngology) [name], MD as Consulting Physician (General Surgery) Truitt Merle, MD as Consulting Physician (Hematology) Kyung Rudd, MD as Consulting Physician (Radiation Oncology) Aloha Gell, MD as Consulting Physician (Obstetrics and Gynecology)  Date of Service:  10/11/2022  CHIEF COMPLAINT: f/u of left breast cancer, vertebral compression fractures  CURRENT THERAPY:  Surveillance  ASSESSMENT & PLAN:  Audrey [name] is a 81 y.o. post-menopausal female with   1. 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  Oncotype RS of 7, low risk.  She declined antiestrogen therapy and continues surveillance -Genetic testing was negative -most recent mammogram on 03/07/22 at [facility] was benign. -she is clinically doing well from a breast cancer standpoint. I reviewed her recent labs from her PCP, as she provided me with a physical copy. Those labs and her CMP from today were reviewed, overall stable. Physical exam was unremarkable. There is no clinical concern for recurrence. -Continue surveillance. She is 7 years out form her cancer diagnosis, I am comfortable releasing her from f/u here.  2. Vertebral Compression Fractures, Osteoporosis  -She reports having low bone density since her 36s -h/o numerous back surgeries, including bone cement at T3-5 -DEXA from 01/12/21 showed osteopenia, with FRAX risk of 13% risk of major osteoporotic fracture and 3.3% risk of hip fracture -she developed thoracic spine compression fractures, initially seen on CT 12/18/21, worse on  repeat 02/14/22. -f/u labs showed: slightly abnormal SPEP, MM panel WNL, and slightly elevated kappa light chain. -PET scan on 04/04/22 showed no findings to suggest metastatic disease, especially bone mets.  -Continue osteoporosis treatment per her endocrinologist.  3. Arthritis, fibromyalgia, and right lateral rib pain -she has persistent right-sided rib pain, related to nerve damage from multiple back surgeries per orthopedist -She will continue follow-up with her orthopedic surgeon     Plan: -f/u as needed    No problem-specific Assessment & Plan notes found for this encounter.   SUMMARY OF ONCOLOGIC HISTORY: Oncology History Overview Note  Breast cancer of upper-outer quadrant of left female breast [facility])   Staging form: Breast, AJCC 7th Edition     Pathologic stage from 11/29/2015: Stage IA (T1c, N0, cM0) - Signed by Truitt Merle, MD on 01/14/2016     Clinical: Stage IA (T1c, N0, M0) - Unsigned      Breast cancer of upper-outer quadrant of left female breast ([place])  07/18/2014 Procedure   Hereditary cancer panel: no deleterious mutation at ATM, BARD1, BRCA1, BRCA2, BRIP1, CDH1, CHEK2, EPCAM, MLH1, MSH2, MSH6, NBN, PALB2, PMS2, PTEN, RAD51C, RAD51D, STK11, and TP53.   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   12/27/2015  Pathology Results   Second surgery with sentinel lymph node biopsy showed no malignant cells in 5 lymph nodes   01/23/2016 - 02/17/2016 Radiation Therapy    Adjuvant left Breast radiation: Left breast treated to 42.5 Gy in 17 fractions, Left breast boost treated to 7.5 Gy in 3 fractions     Anti-estrogen oral therapy   She declined adjuvant AI or tamoxifen    05/03/2016 Survivorship   SCP visit completed   10/31/2017 Mammogram   IMPRESSION: There is no mammographic evidence of malignancy.       INTERVAL HISTORY:  KEIRAN [name] is here for a follow up of compression fractures and breast cancer. She was last seen by me on 04/10/22. She presents to the clinic alone. She reports she is having new stomach issues. She explains she is having diarrhea, which she feels could be related to prednisone(?). She reports having varying diarrhea, between 2-6 BM per day. She also reports pain to her right lower rib cage area from her back surgeries (from damaged nerve, per her surgeon).   All other systems were reviewed with the patient and are negative.  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   Hypertension    Hypothyroidism    Obesity    PONV (postoperative nausea and vomiting)    Reflux    buring and dysphagia sx resp to PPI; egd 3/06 neg for [place]   Tachycardia     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   Yeast infection    hx of     SURGICAL HISTORY: Past Surgical History:  Procedure Laterality Date   AXILLARY LYMPH NODE BIOPSY Left 12/27/2015   Procedure: LEFT AXILLARY SENTINEL LYMPH NODE BIOPSY;  Surgeon: [name], MD;  Location: [place];  Service: General;  Laterality: Left;   [place] Bilateral    BREAST LUMPECTOMY WITH RADIOACTIVE SEED LOCALIZATION Left 12/01/2015   Procedure: LEFT BREAST [place] RADIOACTIVE SEED LOCALIZATION;  Surgeon: [name], MD;  Location: [place];  Service: General;  Laterality: Left;   BREAST SURGERY     CATARACT EXTRACTION, BILATERAL Bilateral 08/2018   COLONOSCOPY  01/2005   NEG   cortisone injections     right arm; back   EYE SURGERY     cosmetic; eyelid surgery bilat    FINGER SURGERY Left    left index finger/ cyst 02/2007   HARDWARE REMOVAL N/A 06/13/2022   Procedure: Bilateral Thoracic Ten- Removal of Screws;  Surgeon: Kary Kos, MD;  Location: [place];  Service: Neurosurgery;  Laterality: N/A;   KNEE ARTHROSCOPY Right 04/13/2015   Procedure: ARTHROSCOPY RIGHT KNEE WITH MEDIAL MENISCAL DEBRIDEMENT AND CHONDROPLASTY;  Surgeon: Gaynelle Arabian, MD;  Location: [facility];  Service: Orthopedics;  Laterality: Right;   KYPHOPLASTY N/A 03/16/2020   Procedure: KYPHOPLASTY THORACIC THREE, THORACIC FOUR, THORACIC SIX;  Surgeon: Jovita Gamma, MD;  [facility];  Service: Neurosurgery;  Laterality: N/A;   KYPHOPLASTY N/A 06/13/2022   Procedure: [place] Thoracic Ten;  Surgeon: Kary Kos, MD;  Location: [place];  Service: Neurosurgery;  Laterality: N/A;   LAMINECTOMY WITH POSTERIOR LATERAL ARTHRODESIS LEVEL 4 N/A 12/20/2021   Procedure: Thoracic Ten-Eleven, Thoracic Eleven-Twelve, Thoracic Twelve-Lumbar One, Lumbar One-Two Posterior Lateral Fusion;  Surgeon: Kary Kos, MD;  Location: [place];  Service: Neurosurgery;  Laterality: N/A;   left rotator cuff repair Left    12/2006   LUMBAR FUSION     x3   LUMBAR LAMINECTOMY     TONSILLECTOMY      I have reviewed the social history and family history with the patient and they are unchanged from previous note.  ALLERGIES:  is allergic to contrast media [iodinated contrast media], gadobenate, other, cymbalta [duloxetine  hcl], amoxicillin, and macrodantin [nitrofurantoin].  MEDICATIONS:  Current Outpatient Medications  Medication Sig Dispense Refill   acetaminophen (TYLENOL) 500 MG tablet Take 1,000 mg by mouth every 6 (six) hours as needed for moderate pain.     Calcium-Vitamin D-Vitamin K (CALCIUM + D) [phone] MG-UNT-MCG CHEW Chew 1 tablet by mouth daily.     cholecalciferol (VITAMIN D) 25 MCG (1000 UNIT) tablet Take 1,000 Units by mouth daily.      CRANBERRY PO Take 2 tablets by mouth in the morning and at bedtime.     Cyanocobalamin (VITAMIN B-12) 5000 MCG SUBL Place 5,000 mcg under the tongue daily.      famotidine (PEPCID) 20 MG tablet Take 20 mg by mouth daily.     gabapentin (NEURONTIN) 300 MG capsule Take 1 tablet in the morning and 4 tablets at bedtime 450 capsule 3   Glucos-Chondroit-Hyaluron-MSM (GLUCOSAMINE CHONDROITIN JOINT PO) Take 1 tablet by mouth daily.     hydrochlorothiazide (HYDRODIURIL) 12.5 MG tablet Take 12.5 mg by mouth daily.     HYDROcodone-acetaminophen (NORCO/VICODIN) 5-325 MG tablet Take 1 tablet by mouth every 4 (four) hours as needed for moderate pain. (Patient not taking: Reported on 05/30/2022) 20 tablet 0   Lactobacillus-Inulin (CULTURELLE DIGESTIVE DAILY PO) Take 1 tablet by mouth daily.     levothyroxine (SYNTHROID, LEVOTHROID) 25 MCG tablet Take 25 mcg by mouth See admin instructions. Take 25 mcg by mouth on Monday, Wednesday, Fri,day and Saturday only.     loratadine (CLARITIN) 10 MG tablet Take 10 mg by mouth daily as needed for allergies.      Magnesium Oxide 200 MG TABS Take 200 mg by mouth daily.     Menatetrenone (VITAMIN K2) 100 MCG TABS Take 100 mcg by mouth daily.     methocarbamol (ROBAXIN) 500 MG tablet Take 1 tablet (500 mg total) by mouth 4 (four) times daily. (Patient taking differently: Take 500 mg by mouth every 6 (six) hours as needed for muscle spasms.) 45 tablet 0   methocarbamol (ROBAXIN) 500 MG tablet Take 1 tablet (500 mg total) by mouth every 6 (six)  hours as needed for muscle spasms. 40 tablet 1   methylPREDNISolone (MEDROL DOSEPAK) 4 MG TBPK tablet As directed 1 each 0   Multiple Vitamins-Minerals (PRESERVISION AREDS 2) CAPS Take 1 tablet by mouth daily.     naproxen sodium (ALEVE) 220 MG tablet Take 220 mg by mouth daily as needed (pain). (Patient not taking: Reported on 05/30/2022)     nebivolol (BYSTOLIC) 5 MG tablet Take 5 mg by mouth daily.     Omega-3 Fatty Acids (FISH OIL PO) Take 1 capsule by mouth daily. (Patient not  taking: Reported on 05/30/2022)     Polyethyl Glyc-Propyl Glyc PF (SYSTANE ULTRA PF) 0.4-0.3 % SOLN Place 1 drop into both eyes in the morning.     potassium chloride (KLOR-CON) 10 MEQ tablet Take 1 tablet (10 mEq total) by mouth daily. Take 2 tabs for one week then change to once daily (Patient taking differently: Take 10 mEq by mouth 2 (two) times daily.) 60 tablet 2   pyridOXINE (VITAMIN B-6) 100 MG tablet Take 100 mg by mouth daily.     vitamin C (ASCORBIC ACID) 500 MG tablet Take 500 mg by mouth daily.     No current facility-administered medications for this visit.    PHYSICAL EXAMINATION: ECOG PERFORMANCE STATUS: 2 - Symptomatic, <50% confined to bed  Vitals:   10/11/22 1403  BP: (!) 164/86  Pulse: 75  Resp: 18  Temp: 97.7 F (36.5 C)  SpO2: 98%   Wt Readings from Last 3 Encounters:  10/11/22 159 lb 14.4 oz (72.5 kg)  06/13/22 155 lb (70.3 kg)  06/06/22 159 lb 14.4 oz (72.5 kg)     GENERAL:alert, no distress and comfortable SKIN: skin color, texture, turgor are normal, no rashes or significant lesions EYES: normal, Conjunctiva are pink and non-injected, sclera clear  NECK: supple, thyroid normal size, non-tender, without nodularity LYMPH:  no palpable lymphadenopathy in the cervical, axillary LUNGS: clear to auscultation and percussion with normal breathing effort HEART: regular rate & rhythm and no murmurs and no lower extremity edema ABDOMEN:abdomen soft, non-tender and normal bowel  sounds Musculoskeletal:no cyanosis of digits and no clubbing  NEURO: alert & oriented x 3 with fluent speech, no focal motor/sensory deficits BREAST: No palpable mass, nodules or adenopathy bilaterally. Breast exam benign.   LABORATORY DATA:  I have reviewed the data as listed    Latest Ref Rng & Units 06/06/2022    2:50 PM 03/22/2022    3:43 PM 12/15/2021   11:23 AM  CBC  WBC 4.0 - 10.5 K/uL 7.9  6.5  6.4   Hemoglobin 12.0 - 15.0 g/dL 13.6  13.2  14.5   Hematocrit 36.0 - 46.0 % 39.3  38.4  42.3   Platelets 150 - 400 K/uL 373  324  406         Latest Ref Rng & Units 10/11/2022    1:57 PM 06/06/2022    2:50 PM 03/22/2022    3:43 PM  CMP  Glucose 70 - 99 mg/dL 101  96  119   BUN 8 - 23 mg/dL _0 Creatinine 0.44 - 1.00 mg/dL 0.84  0.89  0.93   Sodium 135 - 145 mmol/L 134  138  135   Potassium 3.5 - 5.1 mmol/L 4.3  3.8  3.3   Chloride 98 - 111 mmol/L 96  99  96   CO2 22 - 32 mmol/L 32  27  29   Calcium 8.9 - 10.3 mg/dL 9.7  9.6  9.6   Total Protein 6.5 - 8.1 g/dL 7.1   7.2   Total Bilirubin 0.3 - 1.2 mg/dL 0.7   0.6   Alkaline Phos 38 - 126 U/L 78   98   AST 15 - 41 U/L 20   35   ALT 0 - 44 U/L 15   27       RADIOGRAPHIC STUDIES: I have personally reviewed the radiological images as listed and agreed with the findings in the report. No results found.    No orders  of the defined types were placed in this encounter.  All questions were answered. The patient knows to call the clinic with any problems, questions or concerns. No barriers to learning was detected. The total time spent in the appointment was 25 minutes.     Truitt Merle, MD 10/11/2022   I, Wilburn Mylar, am acting as scribe for Truitt Merle, MD.   I have reviewed the above documentation for accuracy and completeness, and I agree with the above.

## 2022-10-15 ENCOUNTER — Encounter: Payer: Self-pay | Admitting: Hematology

## 2022-10-25 ENCOUNTER — Ambulatory Visit: Payer: PPO | Admitting: Neurology

## 2022-10-25 ENCOUNTER — Encounter: Payer: Self-pay | Admitting: Neurology

## 2022-10-25 VITALS — BP 135/85 | HR 76 | Ht <= 58 in | Wt 157.5 lb

## 2022-10-25 DIAGNOSIS — G629 Polyneuropathy, unspecified: Secondary | ICD-10-CM

## 2022-10-25 DIAGNOSIS — M792 Neuralgia and neuritis, unspecified: Secondary | ICD-10-CM | POA: Diagnosis not present

## 2022-10-25 MED ORDER — OXCARBAZEPINE 150 MG PO TABS
150.0000 mg | ORAL_TABLET | Freq: Two times a day (BID) | ORAL | 6 refills | Status: DC
Start: 2022-10-25 — End: 2023-01-18

## 2022-10-25 NOTE — Progress Notes (Signed)
Chief Complaint  Patient presents with   Follow-up    Rm 14. Alone. States neuropathy sx have improved, worse at night. Gabapentin is helpful. C/o rib pain.      ASSESSMENT AND PLAN  Audrey [name] is a 81 y.o. female Right thoracic neuropathic pain  Following right thoracic dermatome, happened after removal of T10 screw with vertebroplasty of T10 for compression fracture of vertebral  Worsening right thoracic pain limiting her body towards the right side, improved left lean towards the left side, suggestive of right lower thoracic nerve root compression,  Already on fair dose of gabapentin for her neuropathy, restless leg symptoms, will add on Trileptal 150 mg twice a day, could not tolerate Cymbalta, significant GI side effect in the past,  Neuropathy, restless leg symptoms,  Overall under good control taking gabapentin 300 mg 1/1, 4 tablets at nighttime,  Mildly length-dependent sensory changes, fairly normal gait,     Return To Clinic With NP In 4 Months, please monitoring her sodium level, if she continues to have significant right thoracic pain, may consider pain management by Dr. Windy Carina office for right epidural injection to lower thoracic region   DIAGNOSTIC DATA (LABS, IMAGING, TESTING) - I reviewed patient records, labs, notes, testing and imaging myself where available.   MEDICAL HISTORY:  Audrey [name], is a 81 year old female, follow-up for neuropathy, her primary care is from [place] Dr.  Doristine Bosworth, Michell Heinrich, MD   I reviewed and summarized the referring note. PMHX Neuropathy. HTN Hypothyrodism History of left breast Cancer, s/p lobectomy, radiation therapy Lumbar decompression surgery.  She was patient of Dr. Jannifer Franklin in the past, carries a diagnosis of peripheral neuropathy, also have significant lumbar degenerative disease underwent multiple surgery, 3 since September 16, 2021, most recent one was by Dr. Saintclair Halsted on June 14 2022 underwent removal of T10 screws  with vertebroplasty of T10   Peripheral neuropathy restless leg symptoms overall under reasonable control taking gabapentin 300 mg 1/1/4 tablets at nighttime, but since her most recent surgery in July 2023, she had a significant right lower thoracic and radiating pain, initially as it knife cutting through, now with significant improvement, but it was sudden positional changes specially leaning towards the right side interval trigger sudden shooting pain  She has been using lidocaine patch, that was helpful 2 out of 10, constant right thoracic area discomfort  She denies bowel or bladder incontinence,  PHYSICAL EXAM:   Vitals:   10/25/22 1340  BP: 135/85  Pulse: 76  Weight: 157 lb 8 oz (71.4 kg)  Height: '4\' 10"'$  (1.473 m)   Not recorded     Body mass index is 32.92 kg/m.  PHYSICAL EXAMNIATION:  Gen: NAD, conversant, well nourised, well groomed                     Cardiovascular: Regular rate rhythm, no peripheral edema, warm, nontender. Eyes: Conjunctivae clear without exudates or hemorrhage Neck: Supple, no carotid bruits. Pulmonary: Clear to auscultation bilaterally   NEUROLOGICAL EXAM:  MENTAL STATUS: Speech/cognition: Awake, alert, oriented to history taking and casual conversation CRANIAL NERVES: CN II: Visual fields are full to confrontation. Pupils are round equal and briskly reactive to light. CN III, IV, VI: extraocular movement are normal. No ptosis. CN V: Facial sensation is intact to light touch CN VII: Face is symmetric with normal eye closure  CN VIII: Hearing is normal to causal conversation. CN IX, X: Phonation is normal. CN XI: Head turning and shoulder  shrug are intact  MOTOR: Limitation of bilateral upper extremity proximal motor strength due to bilateral shoulder pain, no significant muscle weakness  REFLEXES: Reflexes are hypoactive and symmetric at the biceps, triceps, knees, and ankles. Plantar responses are flexor.  SENSORY: Mildly  length-dependent decreased light touch, pinprick,  COORDINATION: There is no trunk or limb dysmetria noted.  GAIT/STANCE: She can get up from seated position arm crossed, fairly steady, swinging her body   REVIEW OF SYSTEMS:  Full 14 system review of systems performed and notable only for as above All other review of systems were negative.   ALLERGIES: Allergies  Allergen Reactions   Contrast Media [Iodinated Contrast Media] Shortness Of Breath   Gadobenate Anaphylaxis   Other Swelling    Cat gut sutures Cats and dogs                Derma bond- intolerance- itching- tolerates with Benadryl       Codeine Other (See Comments)    "Drives me crazy"   Cymbalta [Duloxetine Hcl] Diarrhea   Amoxicillin Nausea And Vomiting and Rash   Macrodantin [Nitrofurantoin] Rash    HOME MEDICATIONS: Current Outpatient Medications  Medication Sig Dispense Refill   acetaminophen (TYLENOL) 500 MG tablet Take 1,000 mg by mouth every 6 (six) hours as needed for moderate pain.     Calcium-Vitamin D-Vitamin K (CALCIUM + D) [phone] MG-UNT-MCG CHEW Chew 1 tablet by mouth daily.     cholecalciferol (VITAMIN D) 25 MCG (1000 UNIT) tablet Take 1,000 Units by mouth daily.      CRANBERRY PO Take 2 tablets by mouth in the morning and at bedtime.     Cyanocobalamin (VITAMIN B-12) 5000 MCG SUBL Place 5,000 mcg under the tongue daily.      gabapentin (NEURONTIN) 300 MG capsule Take 1 tablet in the morning and 4 tablets at bedtime (Patient taking differently: Take 1 tablet in the morning and 1 at lunch and 4 tablets at bedtime) 450 capsule 3   Glucos-Chondroit-Hyaluron-MSM (GLUCOSAMINE CHONDROITIN JOINT PO) Take 1 tablet by mouth daily.     hydrochlorothiazide (HYDRODIURIL) 12.5 MG tablet Take 12.5 mg by mouth daily.     Lactobacillus-Inulin (CULTURELLE DIGESTIVE DAILY PO) Take 1 tablet by mouth daily.     levothyroxine (SYNTHROID, LEVOTHROID) 25 MCG tablet Take 25 mcg by mouth See admin instructions. Take 25  mcg by mouth on Monday, Wednesday, Fri,day and Saturday only.     loratadine (CLARITIN) 10 MG tablet Take 10 mg by mouth daily as needed for allergies.      Menatetrenone (VITAMIN K2) 100 MCG TABS Take 100 mcg by mouth daily.     methocarbamol (ROBAXIN) 500 MG tablet Take 1 tablet (500 mg total) by mouth 4 (four) times daily. (Patient taking differently: Take 500 mg by mouth as needed for muscle spasms.) 45 tablet 0   Multiple Vitamins-Minerals (PRESERVISION AREDS 2) CAPS Take 1 tablet by mouth daily.     naproxen sodium (ALEVE) 220 MG tablet Take 220 mg by mouth daily as needed (pain).     nebivolol (BYSTOLIC) 5 MG tablet Take 5 mg by mouth daily.     Omega-3 Fatty Acids (FISH OIL PO) Take 1 capsule by mouth daily.     Polyethyl Glyc-Propyl Glyc PF (SYSTANE ULTRA PF) 0.4-0.3 % SOLN Place 1 drop into both eyes in the morning.     potassium chloride (KLOR-CON) 10 MEQ tablet Take 1 tablet (10 mEq total) by mouth daily. Take 2 tabs for one week then  change to once daily (Patient taking differently: Take 10 mEq by mouth 2 (two) times daily.) 60 tablet 2   pyridOXINE (VITAMIN B-6) 100 MG tablet Take 100 mg by mouth daily.     vitamin C (ASCORBIC ACID) 500 MG tablet Take 500 mg by mouth daily.     HYDROcodone-acetaminophen (NORCO/VICODIN) 5-325 MG tablet Take 1 tablet by mouth every 4 (four) hours as needed for moderate pain. (Patient not taking: Reported on 05/30/2022) 20 tablet 0   No current facility-administered medications for this visit.    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   Hypertension    Hypothyroidism    Obesity    PONV (postoperative nausea and vomiting)    Reflux    buring and dysphagia sx resp to PPI; egd 3/06 neg for Bar[place]ts   Tachycardia     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   Yeast infection    hx of     PAST SURGICAL HISTORY: Past Surgical History:  Procedure Laterality Date   AXILLARY LYMPH NODE BIOPSY Left 12/27/2015   Procedure: LEFT AXILLARY SENTINEL LYMPH NODE BIOPSY;  Surgeon: [name], MD;  Location: [place];  Service: General;  Laterality: Left;   [place] Bilateral    BREAST LUMPECTOMY WITH RADIOACTIVE SEED LOCALIZATION Left 12/01/2015   Procedure: LEFT BREAST [place] RADIOACTIVE SEED LOCALIZATION;  Surgeon: [name], MD;  Location: [place];  Service: General;  Laterality: Left;   BREAST SURGERY     CATARACT EXTRACTION, BILATERAL Bilateral 08/2018   COLONOSCOPY  01/2005   NEG   cortisone injections     right arm; back   EYE SURGERY     cosmetic; eyelid surgery bilat    FINGER SURGERY Left    left index finger/ cyst 02/2007   HARDWARE REMOVAL N/A 06/13/2022   Procedure: Bilateral Thoracic Ten- Removal of Screws;  Surgeon: Kary Kos, MD;  Location: [place];  Service: Neurosurgery;  Laterality: N/A;   KNEE ARTHROSCOPY Right 04/13/2015   Procedure: ARTHROSCOPY RIGHT KNEE WITH MEDIAL MENISCAL DEBRIDEMENT AND CHONDROPLASTY;  Surgeon: Gaynelle Arabian, MD;  Location: [facility];  Service: Orthopedics;  Laterality: Right;   KYPHOPLASTY N/A 03/16/2020   Procedure: KYPHOPLASTY THORACIC THREE, THORACIC FOUR, THORACIC SIX;  Surgeon: Jovita Gamma, MD;  Location: [facility];  Service: Neurosurgery;  Laterality: N/A;   KYPHOPLASTY N/A 06/13/2022   Procedure: Vertobroplasty Thoracic Ten;  Surgeon: Kary Kos, MD;  Location: [facility];  Service: Neurosurgery;  Laterality: N/A;   LAMINECTOMY WITH POSTERIOR LATERAL ARTHRODESIS LEVEL 4 N/A  12/20/2021   Procedure: Thoracic Ten-Eleven, Thoracic Eleven-Twelve, Thoracic Twelve-Lumbar One, Lumbar One-Two Posterior Lateral Fusion;  Surgeon: Kary Kos, MD;  Location: [place];  Service: Neurosurgery;  Laterality: N/A;   left rotator cuff repair Left    12/2006   LUMBAR FUSION     x3   LUMBAR LAMINECTOMY  removal of screws  08/2022   TONSILLECTOMY      FAMILY HISTORY: Family History  Problem Relation Age of Onset   Diabetes Mother    Breast cancer Mother        dx. late 38s-early 34s; "metastasis to throat"?   Hodgkin's lymphoma Father 38   Breast cancer Sister        maternal half-sister dx. w/ DCIS in her late 55s;   Other Sister        learning disabilities; lives on her own   Diabetes Sister    Alcohol abuse Brother    Breast cancer Maternal Aunt        dx. 55s   Ovarian cancer Maternal Aunt 59   Breast cancer Paternal Aunt        dx. early 65s   Heart Problems Paternal Aunt    Stroke Maternal Grandfather    Depression Paternal Grandmother    Heart Problems Paternal Grandmother    Skin cancer Daughter        dx. early 56s   Other Daughter        insterstitial cystitis   Neuropathy Neg Hx     SOCIAL HISTORY: Social History   Socioeconomic History   Marital status: Married    Spouse name: Joe   Number of children: 2   Years of education: 13   Highest education level: Not on file  Occupational History   Occupation: retired  Tobacco Use   Smoking status: Never   Smokeless tobacco: Never  Vaping Use   Vaping Use: Never used  Substance and Sexual Activity   Alcohol use: No   Drug use: Not Currently    Comment: CBD- last time -10/2021   Sexual activity: Yes  Other Topics Concern   Not on file  Social History Narrative   Patient is right handed.   Patient drinks 2-3 cups of caffeine per day.   Social Determinants of Health   Financial Resource Strain: Not on file  Food Insecurity: Not on file  Transportation Needs: Not on file  Physical  Activity: Not on file  Stress: Not on file  Social Connections: Not on file  Intimate Partner Violence: Not on file      Marcial Pacas, M.D. Ph.D.  [facility] [address] [place], [place] [address] Ph: [phone] Fax: [phone]  CC:  Kelton Pillar, MD [place] [facility] [address] [place],  [place] [address]  Mckinley Jewel, MD

## 2022-10-27 ENCOUNTER — Other Ambulatory Visit: Payer: Self-pay

## 2022-10-27 ENCOUNTER — Emergency Department (HOSPITAL_BASED_OUTPATIENT_CLINIC_OR_DEPARTMENT_OTHER)
Admission: EM | Admit: 2022-10-27 | Discharge: 2022-10-28 | Disposition: A | Discharge status: Home / Self Care | Payer: PPO | Attending: Emergency Medicine | Admitting: Emergency Medicine

## 2022-10-27 ENCOUNTER — Encounter (HOSPITAL_BASED_OUTPATIENT_CLINIC_OR_DEPARTMENT_OTHER): Payer: Self-pay | Admitting: *Deleted

## 2022-10-27 DIAGNOSIS — R0689 Other abnormalities of breathing: Secondary | ICD-10-CM | POA: Diagnosis not present

## 2022-10-27 DIAGNOSIS — R001 Bradycardia, unspecified: Secondary | ICD-10-CM | POA: Diagnosis not present

## 2022-10-27 DIAGNOSIS — E871 Hypo-osmolality and hyponatremia: Secondary | ICD-10-CM | POA: Diagnosis not present

## 2022-10-27 DIAGNOSIS — R1111 Vomiting without nausea: Secondary | ICD-10-CM | POA: Diagnosis not present

## 2022-10-27 DIAGNOSIS — R112 Nausea with vomiting, unspecified: Secondary | ICD-10-CM | POA: Diagnosis not present

## 2022-10-27 DIAGNOSIS — R110 Nausea: Secondary | ICD-10-CM | POA: Diagnosis not present

## 2022-10-27 DIAGNOSIS — I10 Essential (primary) hypertension: Secondary | ICD-10-CM | POA: Diagnosis not present

## 2022-10-27 LAB — COMPREHENSIVE METABOLIC PANEL
ALT: 17 U/L (ref 0–44)
AST: 21 U/L (ref 15–41)
Albumin: 4.4 g/dL (ref 3.5–5.0)
Alkaline Phosphatase: 67 U/L (ref 38–126)
Anion gap: 12 (ref 5–15)
BUN: 11 mg/dL (ref 8–23)
CO2: 28 mmol/L (ref 22–32)
Calcium: 9.2 mg/dL (ref 8.9–10.3)
Chloride: 83 mmol/L — ABNORMAL LOW (ref 98–111)
Creatinine, Ser: 0.7 mg/dL (ref 0.44–1.00)
GFR, Estimated: >60 mL/min (ref >60)
Glucose, Bld: 131 mg/dL — ABNORMAL HIGH (ref 70–99)
Potassium: 2.8 mmol/L — ABNORMAL LOW (ref 3.5–5.1)
Sodium: 123 mmol/L — ABNORMAL LOW (ref 135–145)
Total Bilirubin: 1 mg/dL (ref 0.3–1.2)
Total Protein: 7.2 g/dL (ref 6.5–8.1)

## 2022-10-27 LAB — CBC
HCT: 40.1 % (ref 36.0–46.0)
Hemoglobin: 14.8 g/dL (ref 12.0–15.0)
MCH: 31.6 pg (ref 26.0–34.0)
MCHC: 36.9 g/dL — ABNORMAL HIGH (ref 30.0–36.0)
MCV: 85.7 fL (ref 80.0–100.0)
Platelets: 246 10*3/uL (ref 150–400)
RBC: 4.68 MIL/uL (ref 3.87–5.11)
RDW: 12.2 % (ref 11.5–15.5)
WBC: 6.6 10*3/uL (ref 4.0–10.5)
nRBC: 0 % (ref 0.0–0.2)

## 2022-10-27 LAB — URINALYSIS, ROUTINE W REFLEX MICROSCOPIC
Bilirubin Urine: NEGATIVE
Glucose, UA: NEGATIVE mg/dL
Hgb urine dipstick: NEGATIVE
Ketones, ur: NEGATIVE mg/dL
Nitrite: NEGATIVE
Protein, ur: 30 mg/dL — AB
Specific Gravity, Urine: 1.018 (ref 1.005–1.030)
pH: 7 (ref 5.0–8.0)

## 2022-10-27 LAB — LIPASE, BLOOD: Lipase: 16 U/L (ref 11–51)

## 2022-10-27 LAB — MAGNESIUM: Magnesium: 0.9 mg/dL — CL (ref 1.7–2.4)

## 2022-10-27 MED ORDER — DIPHENHYDRAMINE HCL 50 MG/ML IJ SOLN
12.5000 mg | Freq: Once | INTRAMUSCULAR | Status: AC
  Administered 2022-10-27: 12.5 mg via INTRAVENOUS
  Filled 2022-10-27: qty 1

## 2022-10-27 MED ORDER — SODIUM CHLORIDE 0.9 % IV BOLUS
1000.0000 mL | Freq: Once | INTRAVENOUS | Status: AC
  Administered 2022-10-27: 1000 mL via INTRAVENOUS

## 2022-10-27 MED ORDER — METOCLOPRAMIDE HCL 5 MG/ML IJ SOLN
5.0000 mg | Freq: Once | INTRAMUSCULAR | Status: AC
  Administered 2022-10-27: 5 mg via INTRAVENOUS
  Filled 2022-10-27: qty 2

## 2022-10-27 MED ORDER — POTASSIUM CHLORIDE 10 MEQ/100ML IV SOLN
10.0000 meq | INTRAVENOUS | Status: AC
  Administered 2022-10-27 – 2022-10-28 (×3): 10 meq via INTRAVENOUS
  Filled 2022-10-27 (×3): qty 100

## 2022-10-27 NOTE — ED Notes (Signed)
Tyrone Nine, MD messaged due to patient NS rate increase with K+. Patient also wishing to speak with MD about admission versus going home and coming back in the AM.

## 2022-10-27 NOTE — ED Triage Notes (Signed)
Pt arrives by [facility] for nausea and vomiting for 2 days.  Pt has been constantly nauseated and this has not been relieved by zofran at home.  Pt reports lower abdominal pain.  Pt denies any pain or burning with urination  Pt reports generalized weakness.

## 2022-10-27 NOTE — ED Notes (Signed)
Roxanne Mins, MD at bedside for update on patient plan of care.

## 2022-10-27 NOTE — ED Notes (Signed)
Mg 0.9. Dr. Roxanne Mins aware.

## 2022-10-27 NOTE — ED Notes (Addendum)
CRITICAL VALUE STICKER  CRITICAL VALUE: K 2.8, Na 123  RECEIVER (on-site recipient of call):  DATE & TIME NOTIFIED: 9:15 PM    MD NOTIFIED: Deno Etienne MD  TIME OF NOTIFICATION: 9:15 PM   RESPONSE:  direct message

## 2022-10-27 NOTE — ED Provider Notes (Addendum)
[facility] EMERGENCY DEPT Provider Note   CSN: [id number] Arrival date & time: 10/27/22  1938     History  Chief Complaint  Patient presents with   Emesis    Audrey [name] is a 81 y.o. female.  81 yo F with a cc of nausea vomiting.  Going on for about 48 hours.  Not able to eat or drink anything not able to take any of her home medicines.  Abdominal pain with vomiting.  No diarrhea no suspicious food intake no known sick contacts.   Emesis      Home Medications Prior to Admission medications   Medication Sig Start Date End Date Taking? Authorizing Provider  acetaminophen (TYLENOL) 500 MG tablet Take 1,000 mg by mouth every 6 (six) hours as needed for moderate pain.    [provider]  Calcium-Vitamin D-Vitamin K (CALCIUM + D) [id number] MG-UNT-MCG CHEW Chew 1 tablet by mouth daily.    [provider]  cholecalciferol (VITAMIN D) 25 MCG (1000 UNIT) tablet Take 1,000 Units by mouth daily.     [provider]  CRANBERRY PO Take 2 tablets by mouth in the morning and at bedtime.    [provider]  Cyanocobalamin (VITAMIN B-12) 5000 MCG SUBL Place 5,000 mcg under the tongue daily.     [provider]  gabapentin (NEURONTIN) 300 MG capsule Take 1 tablet in the morning and 4 tablets at bedtime Patient taking differently: Take 1 tablet in the morning and 1 at lunch and 4 tablets at bedtime 10/25/21   Ward Givens, NP  Glucos-Chondroit-Hyaluron-MSM (GLUCOSAMINE CHONDROITIN JOINT PO) Take 1 tablet by mouth daily.    [provider]  hydrochlorothiazide (HYDRODIURIL) 12.5 MG tablet Take 12.5 mg by mouth daily. 07/09/21   [provider]  HYDROcodone-acetaminophen (NORCO/VICODIN) 5-325 MG tablet Take 1 tablet by mouth every 4 (four) hours as needed for moderate pain. Patient not taking: Reported on 05/30/2022 12/21/21 12/21/22  Meyran, Ocie Cornfield, NP  Lactobacillus-Inulin (CULTURELLE DIGESTIVE DAILY PO)  Take 1 tablet by mouth daily.    [provider]  levothyroxine (SYNTHROID, LEVOTHROID) 25 MCG tablet Take 25 mcg by mouth See admin instructions. Take 25 mcg by mouth on Monday, Wednesday, Fri,day and Saturday only.    [provider]  loratadine (CLARITIN) 10 MG tablet Take 10 mg by mouth daily as needed for allergies.     [provider]  Menatetrenone (VITAMIN K2) 100 MCG TABS Take 100 mcg by mouth daily.    [provider]  methocarbamol (ROBAXIN) 500 MG tablet Take 1 tablet (500 mg total) by mouth 4 (four) times daily. Patient taking differently: Take 500 mg by mouth as needed for muscle spasms. 12/21/21   Meyran, Ocie Cornfield, NP  Multiple Vitamins-Minerals (PRESERVISION AREDS 2) CAPS Take 1 tablet by mouth daily.    [provider]  naproxen sodium (ALEVE) 220 MG tablet Take 220 mg by mouth daily as needed (pain).    [provider]  nebivolol (BYSTOLIC) 5 MG tablet Take 5 mg by mouth daily.    [provider]  Omega-3 Fatty Acids (FISH OIL PO) Take 1 capsule by mouth daily.    [provider]  OXcarbazepine (TRILEPTAL) 150 MG tablet Take 1 tablet (150 mg total) by mouth 2 (two) times daily. 10/25/22   Marcial Pacas, MD  Polyethyl Glyc-Propyl Glyc PF (SYSTANE ULTRA PF) 0.4-0.3 % SOLN Place 1 drop into both eyes in the morning.    [provider]  potassium chloride (KLOR-CON) 10 MEQ tablet Take 1 tablet (10 mEq total) by mouth daily. Take 2 tabs for one week then change to once daily Patient taking differently: Take 10 mEq by mouth 2 (two) times daily. 01/05/21   Truitt Merle, MD  pyridOXINE (VITAMIN B-6) 100 MG tablet Take 100 mg by mouth daily.    [provider]  vitamin C (ASCORBIC ACID) 500 MG tablet Take 500 mg by mouth daily.    [provider]      Allergies    Contrast media [iodinated contrast media], Gadobenate, Other, Codeine, Cymbalta [duloxetine hcl], Amoxicillin, and Macrodantin  [nitrofurantoin]    Review of Systems   Review of Systems  Gastrointestinal:  Positive for vomiting.    Physical Exam Updated Vital Signs BP (!) 160/78   Pulse 72   Temp 98.4 F (36.9 C) (Oral)   Resp 20   SpO2 97%  Physical Exam Vitals and nursing note reviewed.  Constitutional:      General: She is not in acute distress.    Appearance: She is well-developed. She is not diaphoretic.  HENT:     Head: Normocephalic and atraumatic.  Eyes:     Pupils: Pupils are equal, round, and reactive to light.  Cardiovascular:     Rate and Rhythm: Normal rate and regular rhythm.     Heart sounds: No murmur heard.    No friction rub. No gallop.  Pulmonary:     Effort: Pulmonary effort is normal.     Breath sounds: No wheezing or rales.  Abdominal:     General: There is no distension.     Palpations: Abdomen is soft.     Tenderness: There is no abdominal tenderness.     Comments: Benign abdominal exam  Musculoskeletal:        General: No tenderness.     Cervical back: Normal range of motion and neck supple.  Skin:    General: Skin is warm and dry.  Neurological:     Mental Status: She is alert and oriented to person, place, and time.  Psychiatric:        Behavior: Behavior normal.     ED Results / Procedures / Treatments   Labs (all labs ordered are listed, but only abnormal results are displayed) Labs Reviewed  COMPREHENSIVE METABOLIC PANEL - Abnormal; Notable for the following components:      Result Value   Sodium 123 (*)    Potassium 2.8 (*)    Chloride 83 (*)    Glucose, Bld 131 (*)    All other components within normal limits  CBC - Abnormal; Notable for the following components:   MCHC 36.9 (*)    All other components within normal limits  URINALYSIS, ROUTINE W REFLEX MICROSCOPIC - Abnormal; Notable for the following components:   APPearance HAZY (*)    Protein, ur 30 (*)    Leukocytes,Ua SMALL (*)    Bacteria, UA FEW (*)    All other components within normal  limits  LIPASE, BLOOD  MAGNESIUM    EKG None  Radiology No results found.  Procedures .Critical Care  Performed by: Deno Etienne, DO Authorized by: Deno Etienne, DO   Critical care provider statement:    Critical care time (minutes):  35   Critical care time was exclusive of:  Separately billable procedures and treating other patients   Critical care was time spent personally by me on the following activities:  Development of treatment plan with patient or  surrogate, discussions with consultants, evaluation of patient's response to treatment, examination of patient, ordering and review of laboratory studies, ordering and review of radiographic studies, ordering and performing treatments and interventions, pulse oximetry, re-evaluation of patient's condition and review of old charts   Care discussed with: admitting provider       Medications Ordered in ED Medications  potassium chloride 10 mEq in 100 mL IVPB (10 mEq Intravenous New Bag/Given 10/27/22 2310)  sodium chloride 0.9 % bolus 1,000 mL (0 mLs Intravenous Stopped 10/27/22 2217)  metoCLOPramide (REGLAN) injection 5 mg (5 mg Intravenous Given 10/27/22 2212)  diphenhydrAMINE (BENADRYL) injection 12.5 mg (12.5 mg Intravenous Given 10/27/22 2209)    ED Course/ Medical Decision Making/ A&P                           Medical Decision Making Amount and/or Complexity of Data Reviewed Labs: ordered.  Risk Prescription drug management.   81 yo F with a chief complaints of intractable nausea and vomiting.  Going on for about 24 to 48 hours.  Patient is well-appearing nontoxic.  She has a benign abdominal exam.  She does have a sodium of 123.  She is on HCTZ.  Potassium also low at 2.8.  Will supplement here.  Treat nausea.  Bolus of IV fluids as patient likely is hypovolemic hyponatremia.  Will discuss with medicine.  The patients results and plan were reviewed and discussed.   Any x-rays performed were independently reviewed by  myself.   Differential diagnosis were considered with the presenting HPI.  Medications  potassium chloride 10 mEq in 100 mL IVPB (10 mEq Intravenous New Bag/Given 10/27/22 2310)  sodium chloride 0.9 % bolus 1,000 mL (0 mLs Intravenous Stopped 10/27/22 2217)  metoCLOPramide (REGLAN) injection 5 mg (5 mg Intravenous Given 10/27/22 2212)  diphenhydrAMINE (BENADRYL) injection 12.5 mg (12.5 mg Intravenous Given 10/27/22 2209)    Vitals:   10/27/22 1953 10/27/22 2148 10/27/22 2215 10/27/22 2315  BP: (!) 153/115 (!) 153/113 (!) 153/100 (!) 160/78  Pulse: 83 69 84 72  Resp: '18 19 15 20  '$ Temp: 98.4 F (36.9 C)     TempSrc: Oral     SpO2: 98% 98% 98% 97%    Final diagnoses:  Hyponatremia    Admission/ observation were discussed with the admitting physician, patient and/or family and they are comfortable with the plan.          Final Clinical Impression(s) / ED Diagnoses Final diagnoses:  Hyponatremia    Rx / DC Orders ED Discharge Orders     None         Deno Etienne, DO 10/27/22 Ogilvie, [place], DO 10/27/22 2332

## 2022-10-28 LAB — BASIC METABOLIC PANEL
Anion gap: 11 (ref 5–15)
BUN: 8 mg/dL (ref 8–23)
CO2: 28 mmol/L (ref 22–32)
Calcium: 8.7 mg/dL — ABNORMAL LOW (ref 8.9–10.3)
Chloride: 95 mmol/L — ABNORMAL LOW (ref 98–111)
Creatinine, Ser: 0.68 mg/dL (ref 0.44–1.00)
GFR, Estimated: >60 mL/min (ref >60)
Glucose, Bld: 93 mg/dL (ref 70–99)
Potassium: 2.9 mmol/L — ABNORMAL LOW (ref 3.5–5.1)
Sodium: 134 mmol/L — ABNORMAL LOW (ref 135–145)

## 2022-10-28 LAB — MAGNESIUM: Magnesium: 1.9 mg/dL (ref 1.7–2.4)

## 2022-10-28 MED ORDER — MAGNESIUM SULFATE 2 GM/50ML IV SOLN
2.0000 g | INTRAVENOUS | Status: AC
  Administered 2022-10-28 (×2): 2 g via INTRAVENOUS
  Filled 2022-10-28 (×2): qty 50

## 2022-10-28 MED ORDER — POTASSIUM CHLORIDE 20 MEQ PO PACK
40.0000 meq | PACK | Freq: Every day | ORAL | Status: DC
  Administered 2022-10-28: 40 meq via ORAL
  Filled 2022-10-28: qty 2

## 2022-10-28 MED ORDER — ONDANSETRON HCL 8 MG PO TABS: 8.0000 mg | ORAL_TABLET | Freq: Three times a day (TID) | ORAL | 0 refills | Status: DC | PRN

## 2022-10-28 NOTE — ED Provider Notes (Signed)
  Physical Exam  BP (!) 165/96 (BP Location: Right Arm)   Pulse 75   Temp 98.1 F (36.7 C) (Oral)   Resp 16   SpO2 98%   Physical Exam  Procedures  Procedures  ED Course / MDM    Medical Decision Making Amount and/or Complexity of Data Reviewed Labs: ordered.  Risk Prescription drug management. Decision regarding hospitalization.   Patient had nausea and vomiting.  Presented here secondary to that.  Labs were noted to be abnormal with significantly hypomagnesemic and hyponatremic and some decreased potassium.  She had IV repletion of her magnesium and IV fluids.  Patient feels improved.  I rechecked her labs.  Repeat magnesium has increased from 0.9-1.9 Sodium has improved from 1 23-1 34 Patient remains somewhat hypokalemic.  She states she generally has run low and her potassium and has potassium repletion at home.  She is no longer nauseated and is able to take p.o. without difficulty. Patient requests refill of her Zofran prescription She is advised regarding follow-up with her primary care physician and return precautions and voices understanding.        Pattricia Boss, MD 10/28/22 1053

## 2022-10-28 NOTE — Progress Notes (Signed)
Hospitalist Transfer Note:  Transferring facility: [facility] Requesting provider: Dr. Tyrone Nine (EDP at [facility]) Reason for transfer: admission for further evaluation and management of acute hyponatremia in the setting of intractable nausea, vomiting, with presentation also notable for dehydration, and hypokalemia.    81  year old female  who presented to [facility] ED complaining of 2 days of recurrent nausea//vomiting associated nonbloody, nonbilious emesis, and inability to keep anything down over that timeframe.  She reportedly appears dehydrated on exam, with labs notable for serum sodium of 123, compared to 134 last month.  It is noted that she is also on HCTZ as an outpatient.  Additional labs notable for low potassium of 2.8.  Vital signs in the ED were reported to be stable, and the patient is without additional reported complaint at this time.   Subsequently, I accepted this patient for transfer for inpatient admission to a med-tele bed at [facility] or [facility] (first available) for further work-up and management of the above .      Check [email or web address] for on-call coverage.   Nursing staff, Please call [place] number on Amion as soon as patient's arrival, so appropriate admitting provider can evaluate the pt.     Babs Bertin, DO Hospitalist

## 2022-10-28 NOTE — ED Notes (Signed)
Called Bed Placement, spoke to Margaret; was informed "waiting on discharges". 

## 2022-10-28 NOTE — Discharge Instructions (Signed)
Please continue to take fluids in by mouth Call your doctor tomorrow for recheck of your potassium, magnesium, and sodium. You may return if you are having worsening symptoms at any time

## 2022-10-28 NOTE — ED Notes (Signed)
Assisted pt to the bathroom. Pt's gait was steady and ambulatory independently to and from the bathroom. Pt states desire to remain in personal pajamas when offered a hospital gown. IV K continues to infuse. Pt aware she is awaiting hospital admission pending bed assignment and transfer. Pt repositioned in bed. Remains on VS monitoring. Call bell within reach. Denies any additional needs.

## 2022-10-28 NOTE — ED Notes (Signed)
She tells me she feels remarkably better; and hasn't vomited "for hours now, and I'm not nauseated". She is able to tolerate orange juice per her request. She ambulates without difficulty to b.r. and back.

## 2022-10-28 NOTE — ED Provider Notes (Signed)
Magnesium is come back critically low at 0.9.  I have ordered intravenous magnesium.  Patient asked me if she could be discharged home with oral electrolyte replacements, advised that that would be dangerous in light of how low both potassium and magnesium were and that she needed to be connected to cardiac monitor while these medications are being replaced.   Delora Fuel, MD [id number]/73 [id number]

## 2022-11-07 DIAGNOSIS — E876 Hypokalemia: Secondary | ICD-10-CM | POA: Diagnosis not present

## 2022-11-07 DIAGNOSIS — R112 Nausea with vomiting, unspecified: Secondary | ICD-10-CM | POA: Diagnosis not present

## 2022-11-07 DIAGNOSIS — I10 Essential (primary) hypertension: Secondary | ICD-10-CM | POA: Diagnosis not present

## 2022-11-07 DIAGNOSIS — E871 Hypo-osmolality and hyponatremia: Secondary | ICD-10-CM | POA: Diagnosis not present

## 2022-11-14 DIAGNOSIS — M8008XD Age-related osteoporosis with current pathological fracture, vertebra(e), subsequent encounter for fracture with routine healing: Secondary | ICD-10-CM | POA: Diagnosis not present

## 2022-11-28 DIAGNOSIS — R300 Dysuria: Secondary | ICD-10-CM | POA: Diagnosis not present

## 2022-11-29 DIAGNOSIS — S22000A Wedge compression fracture of unspecified thoracic vertebra, initial encounter for closed fracture: Secondary | ICD-10-CM | POA: Diagnosis not present

## 2022-11-29 DIAGNOSIS — Z6832 Body mass index (BMI) 32.0-32.9, adult: Secondary | ICD-10-CM | POA: Diagnosis not present

## 2022-12-10 DIAGNOSIS — G588 Other specified mononeuropathies: Secondary | ICD-10-CM | POA: Diagnosis not present

## 2022-12-12 ENCOUNTER — Ambulatory Visit: Payer: PPO | Admitting: Family Medicine

## 2022-12-12 VITALS — BP 168/102 | Ht <= 58 in | Wt 166.0 lb

## 2022-12-12 DIAGNOSIS — S22000A Wedge compression fracture of unspecified thoracic vertebra, initial encounter for closed fracture: Secondary | ICD-10-CM

## 2022-12-12 DIAGNOSIS — M818 Other osteoporosis without current pathological fracture: Secondary | ICD-10-CM

## 2022-12-13 NOTE — Progress Notes (Signed)
PCP: Mckinley Jewel, MD  Subjective:   HPI: Patient is a 82 y.o. female here for osteoporosis.  Patient here to establish care for her known osteoporosis. Unfortunately she's had multiple thoracic and L1 compression fractures of her back. History of surgeries as well including kyphooplasty (T3, 4, 6, 10) and lumbar spine fusion at L3-S1 levels No personal history of hip fractures.  Remotely had a wrist fracture in her early 64s. No history of heart disease or stroke. History of breast cancer. Denies GI ulcers, kidney disease, severe GERD.  No gastric bypass. Of note she had a couple doses of evenity and developed severe GI symptoms - uncertain if related to this but was quite hyponatremic and dehydrated though recovered in December. Menopause age 19 Has not had hysterectomy. Taking calcium and Vitamin D twice daily but uncertain of the dose. Not used hormone replacement therapy. Nonsmoker and no history of smoking.  Nondrinker. Unable to exercise much due to recent right side sharp pain - being evaluated and treated by neurosurgery. Last DEXA 12/2020 - T scores: R femoral neck -2.0, Left femoral neck -1.4, Spine - 0.7 Frax scores 13% any fracture, 3.3% hip fracture. She discussed starting Prolia with Dr. Layne Benton and is here today to establish and then start this treatment.  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     Calcium-Vitamin D-Vitamin K (CALCIUM + D) 7240915579-40 MG-UNT-MCG CHEW Chew 1 tablet by mouth daily.     cholecalciferol (VITAMIN D) 25 MCG (1000 UNIT) tablet Take 1,000 Units by mouth daily.      CRANBERRY PO Take 2 tablets by mouth in the morning and at bedtime.     Cyanocobalamin (VITAMIN B-12) 5000 MCG SUBL Place 5,000 mcg under the tongue daily.      gabapentin (NEURONTIN) 300 MG capsule Take 1 tablet in the morning and 4 tablets at bedtime (Patient taking differently: Take 1 tablet in the morning and 1 at lunch and 4 tablets at bedtime) 450 capsule 3   Glucos-Chondroit-Hyaluron-MSM (GLUCOSAMINE CHONDROITIN JOINT PO) Take 1 tablet by mouth daily.     hydrochlorothiazide (HYDRODIURIL) 12.5 MG tablet Take 12.5 mg by mouth daily.     HYDROcodone-acetaminophen (NORCO/VICODIN) 5-325 MG tablet Take 1 tablet by mouth every 4 (four) hours as needed for moderate pain. (Patient not taking: Reported on 05/30/2022)  20 tablet 0   Lactobacillus-Inulin (CULTURELLE DIGESTIVE DAILY PO) Take 1 tablet by mouth daily.     levothyroxine (SYNTHROID, LEVOTHROID) 25 MCG tablet Take 25 mcg by mouth See admin instructions. Take 25 mcg by mouth on Monday, Wednesday, Fri,day and Saturday only.     loratadine (CLARITIN) 10 MG tablet Take 10 mg by mouth daily as needed for allergies.      Menatetrenone (VITAMIN K2) 100 MCG TABS Take 100 mcg by mouth daily.     methocarbamol (ROBAXIN) 500 MG tablet Take 1 tablet (500 mg total) by mouth 4 (four) times daily. (Patient taking differently: Take 500 mg by mouth as needed for muscle spasms.) 45 tablet 0   Multiple  Vitamins-Minerals (PRESERVISION AREDS 2) CAPS Take 1 tablet by mouth daily.     naproxen sodium (ALEVE) 220 MG tablet Take 220 mg by mouth daily as needed (pain).     nebivolol (BYSTOLIC) 5 MG tablet Take 5 mg by mouth daily.     Omega-3 Fatty Acids (FISH OIL PO) Take 1 capsule by mouth daily.     ondansetron (ZOFRAN) 8 MG tablet Take 1 tablet (8 mg total) by mouth every 8 (eight) hours as needed for nausea or vomiting. 20 tablet 0   OXcarbazepine (TRILEPTAL) 150 MG tablet Take 1 tablet (150 mg total) by mouth 2 (two) times daily. 60 tablet 6   Polyethyl Glyc-Propyl Glyc PF (SYSTANE ULTRA PF) 0.4-0.3 % SOLN Place 1 drop into both eyes in the morning.     potassium chloride (KLOR-CON) 10 MEQ tablet Take 1 tablet (10 mEq total) by mouth daily. Take 2 tabs for one week then change to once daily (Patient taking differently: Take 10 mEq by mouth 2 (two) times daily.) 60 tablet 2   pyridOXINE (VITAMIN B-6) 100 MG tablet Take 100 mg by mouth daily.     vitamin C (ASCORBIC ACID) 500 MG tablet Take 500 mg by mouth daily.     No current facility-administered medications on file prior to visit.    Past Surgical History:  Procedure Laterality Date   AXILLARY LYMPH NODE BIOPSY Left 12/27/2015   Procedure: LEFT AXILLARY SENTINEL LYMPH NODE BIOPSY;  Surgeon: [name], MD;  Location: [facility];  Service: General;  Laterality: Left;   BELPHAROPTOSIS REPAIR Bilateral    BREAST LUMPECTOMY WITH RADIOACTIVE SEED LOCALIZATION Left 12/01/2015   Procedure: LEFT BREAST [place] RADIOACTIVE SEED LOCALIZATION;  Surgeon: [name], MD;  Location: [facility];  Service: General;  Laterality: Left;   BREAST SURGERY     CATARACT EXTRACTION, BILATERAL Bilateral 08/2018   COLONOSCOPY  01/2005   NEG   cortisone injections     right arm; back   EYE SURGERY     cosmetic; eyelid surgery bilat    FINGER SURGERY Left    left index finger/ cyst 02/2007   HARDWARE REMOVAL N/A 06/13/2022   Procedure:  Bilateral Thoracic Ten- Removal of Screws;  Surgeon: Kary Kos, MD;  Location: [facility];  Service: Neurosurgery;  Laterality: N/A;   KNEE ARTHROSCOPY Right 04/13/2015   Procedure: ARTHROSCOPY RIGHT KNEE WITH MEDIAL MENISCAL DEBRIDEMENT AND CHONDROPLASTY;  Surgeon: Gaynelle Arabian, MD;  Location: [facility];  Service: Orthopedics;  Laterality: Right;   KYPHOPLASTY N/A 03/16/2020   Procedure: KYPHOPLASTY THORACIC THREE, THORACIC FOUR, THORACIC SIX;  Surgeon: Jovita Gamma, MD;  Location: [facility];  Service: Neurosurgery;  Laterality: N/A;   KYPHOPLASTY N/A 06/13/2022   Procedure: [place] Thoracic Ten;  Surgeon: Kary Kos, MD;  Location:  [facility];  Service: Neurosurgery;  Laterality: N/A;   LAMINECTOMY WITH POSTERIOR LATERAL ARTHRODESIS LEVEL 4 N/A 12/20/2021   Procedure: Thoracic Ten-Eleven, Thoracic Eleven-Twelve, Thoracic Twelve-Lumbar One, Lumbar One-Two Posterior Lateral Fusion;  Surgeon: Kary Kos, MD;  Location: [facility];  Service: Neurosurgery;  Laterality: N/A;   left rotator cuff repair Left    12/2006   LUMBAR FUSION     x3   LUMBAR LAMINECTOMY     removal of screws  08/2022   TONSILLECTOMY      Allergies  Allergen Reactions   Contrast Media [Iodinated Contrast Media] Shortness Of Breath   Gadobenate Anaphylaxis   Other Swelling    Cat gut sutures Cats and dogs                Derma bond- intolerance- itching- tolerates with Benadryl       Codeine Other (See Comments)    "Drives me crazy"   Cymbalta [Duloxetine Hcl] Diarrhea   Amoxicillin Nausea And Vomiting and Rash   Macrodantin [Nitrofurantoin] Rash    BP (!) 168/102   Ht '4\' 10"'$  (1.473 m)   Wt 166 lb (75.3 kg)   BMI 34.69 kg/m       No data to display              No data to display              Objective:  Physical Exam:  Gen: NAD, comfortable in exam room   Assessment & Plan:  1. Osteoporosis - Unfortunately several compression fractures in past of thoracic spine and L1.  Possible GI symptoms on  evenity.  History of cancer so no forteo or tymlos.  With her recent GI issues will avoid bisphosphonates but can revisit in future.  Discussed risks/benefits of Prolia.  Calcium at PCP office was 9.7 most recently on 11/07/22.  She will be due for a DEXA soon as last one was 12/2020.  Other labwork requested from her PCP and will be scanned into chart.  Total visit time 40 minutes including visit, documentation, chart and record review.

## 2022-12-14 ENCOUNTER — Telehealth: Payer: Self-pay

## 2022-12-14 NOTE — Telephone Encounter (Addendum)
New start  Former Dr. Layne Benton pt Hx of cancer

## 2022-12-18 NOTE — Telephone Encounter (Signed)
Called and spoke with [place] with [employer], advised of the following:  Deductible does not apply 20% co-insurance for Prolia and admin OOP max of $3,000  Unable to provide documentation of coverage for Prolia specifically, will check on [employer] provider portal.

## 2022-12-19 NOTE — Telephone Encounter (Signed)
20% of Prolia ($1511.88) : $302.37   20% Administration ($130): $26 Pt would pay $328.37 at the visit for each Prolia injection.    Called and informed pt of the above figures for her treatment and pt has now decided, after talking it over with her family, to wait before starting Prolia therapy.  She is schedule to see her back surgeon mid-feb to discuss some issues and she would like to get his opinion before starting Prolia. Given her most recent side effect with Evenity, she wants to make sure and do more research before beginning this therapy.  She will contact us after she visits her back doctor if she wants to start Prolia.

## 2022-12-20 NOTE — Telephone Encounter (Addendum)
Pt ready for scheduling on or after 12/19/22  Out-of-pocket cost due at time of visit: $327  Primary: [employer] Adv Plan II PPO Prolia co-insurance: 20% (approximately $302) Admin fee co-insurance: 20% (approximately $25)  Deductible: does not apply  Secondary: n/a Prolia co-insurance:  Admin fee co-insurance:  Deductible:   Prior Auth: NOT required PA# Valid:   ** This summary of benefits is an estimation of the patient's out-of-pocket cost. Exact cost may vary based on individual plan coverage.

## 2022-12-21 ENCOUNTER — Telehealth: Payer: Self-pay | Admitting: *Deleted

## 2022-12-26 ENCOUNTER — Other Ambulatory Visit: Payer: Self-pay | Admitting: Adult Health

## 2023-01-03 DIAGNOSIS — S22000A Wedge compression fracture of unspecified thoracic vertebra, initial encounter for closed fracture: Secondary | ICD-10-CM | POA: Diagnosis not present

## 2023-01-18 ENCOUNTER — Ambulatory Visit: Payer: PPO | Attending: Cardiology | Admitting: Cardiology

## 2023-01-18 ENCOUNTER — Encounter: Payer: Self-pay | Admitting: Cardiology

## 2023-01-18 VITALS — BP 162/94 | HR 79 | Ht <= 58 in | Wt 162.4 lb

## 2023-01-18 DIAGNOSIS — I10 Essential (primary) hypertension: Secondary | ICD-10-CM | POA: Diagnosis not present

## 2023-01-18 DIAGNOSIS — I4711 Inappropriate sinus tachycardia, so stated: Secondary | ICD-10-CM | POA: Diagnosis not present

## 2023-01-18 DIAGNOSIS — Z79899 Other long term (current) drug therapy: Secondary | ICD-10-CM | POA: Diagnosis not present

## 2023-01-18 MED ORDER — NEBIVOLOL HCL 5 MG PO TABS: 5.0000 mg | ORAL_TABLET | Freq: Two times a day (BID) | ORAL | 3 refills | Status: DC

## 2023-01-18 NOTE — Addendum Note (Signed)
Addended by: Joni Reining on: 01/18/2023 02:29 PM   Modules accepted: Orders

## 2023-01-18 NOTE — Patient Instructions (Signed)
Medication Instructions:  Please change how you take your bystolic. Instead of taking 5 mg once a day, please take a 5 mg tablet twice a day.  *If you need a refill on your cardiac medications before your next appointment, please call your pharmacy*   Lab Work: Please complete a BMET and a Magnesium level in our lab before you leave today.   If you have labs (blood work) drawn today and your tests are completely normal, you will receive your results only by: [place] (if you have MyChart) OR A paper copy in the mail If you have any lab test that is abnormal or we need to change your treatment, we will call you to review the results.   Testing/Procedures: None.   Follow-Up: At [facility], you and your health needs are our priority.  As part of our continuing mission to provide you with exceptional heart care, we have created designated Provider Care Teams.  These Care Teams include your primary Cardiologist (physician) and Advanced Practice Providers (APPs -  Physician Assistants and Nurse Practitioners) who all work together to provide you with the care you need, when you need it.  We recommend signing up for the patient portal called "MyChart".  Sign up information is provided on this After Visit Summary.  MyChart is used to connect with patients for Virtual Visits (Telemedicine).  Patients are able to view lab/test results, encounter notes, upcoming appointments, etc.  Non-urgent messages can be sent to your provider as well.   To learn more about what you can do with MyChart, go to [email or web address].    Your next appointment:   1 year(s)  Provider:   Dr. Fransico Him, MD   Other Instructions Please keep a blood pressure log by checking your blood pressure once a day 2-3 hours after you take your morning medications. Write these values down for one week and then call our office at [phone] to report these results.

## 2023-01-18 NOTE — Progress Notes (Signed)
Cardiology CONSULT Note    Date:  01/18/2023   ID:  Audrey, [name] 1941/08/29, MRN [id number]  PCP:  Mckinley Jewel, MD  Cardiologist:  Fransico Him, MD   Chief Complaint  Patient presents with   New Patient (Initial Visit)    Inappropriate sinus tachycardia    History of Present Illness:  Audrey [name] is a 82 y.o. female who is being seen today for the evaluation of sinus tachycardia at the request of Audrey, Rinka R, MD.  This is a 82 year old female with a history of breast cancer, diastolic dysfunction, fibromyalgia, GERD, hypertension and inappropriate sinus tachycardia.  She was seen by me back in 2016 but was lost to follow-up and is now referred back.  When I saw her in 2016 she was on beta-blocker therapy which was stopped and she was started on losartan for treatment of her hypertension due to the beta-blockers causing fatigue and sleepiness.  Off the beta-blocker she was not having any problems with tachycardia at that time and heart rate was in the 80s.  Recently she has had some problems with elevated BP recently.  She has had some back problems and got a steroid injection recently.  Prior to the injection they took her BP and it ranged from 118/113 to 176/151mHg and then next day was 144/82 to 186/1069mg.  She saw her PCP and blood work was done.  She was tried on amlodipine which caused her to be very sleepy.  She denies any chest pain or pressure, SOB, DOE, LE edema, dizziness, palpitations or syncope.   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   Hypertension    Hypothyroidism    Obesity    PONV (postoperative nausea and vomiting)    Reflux    buring and dysphagia sx resp to PPI; egd 3/06 neg for Barretts   Tachycardia     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   Yeast infection    hx of     Past Surgical History:  Procedure Laterality Date   AXILLARY LYMPH NODE BIOPSY Left 12/27/2015   Procedure: LEFT AXILLARY SENTINEL LYMPH NODE BIOPSY;  Surgeon: DaAlphonsa OverallMD;  [place] [facility] Service[place];  Laterality: Left;   BELPHAROPTOSIS REPAIR Bilateral    BREAST LUMPECTOMY WITH RADIOACTIVE SEED LOCALIZATION Left 12/01/2015   Procedure: LEFT BREAST BIB[place]ADIOACTIVE SEED LOCALIZATION;  Surgeon: DaAlphonsa OverallMD;  [place] [facility] Service: General;  Laterality: Left;   BREAST SURGERY     CATARACT EXTRACTION, BILATERAL Bilateral 08/2018   COLONOSCOPY  01/2005   NEG   cortisone injections     right arm; back   EYE SURGERY     cosmetic; eyelid surgery bilat    FINGER SURGERY Left    left index finger/ cyst 02/2007   HARDWARE REMOVAL N/A 06/13/2022   Procedure: Bilateral Thoracic  Ten- Removal of Screws;  Surgeon: Kary Kos, MD;  Location: [place];  Service: Neurosurgery;  Laterality: N/A;   KNEE ARTHROSCOPY Right 04/13/2015   Procedure: ARTHROSCOPY RIGHT KNEE WITH MEDIAL MENISCAL DEBRIDEMENT AND CHONDROPLASTY;  Surgeon: Gaynelle Arabian, MD;  Location[facility];  Service: Orthopedics;  Laterality: Right;   KYPHOPLASTY N/A 03/16/2020   Procedure: KYPHOPLASTY THORACIC THREE, THORACIC FOUR, THORACIC SIX;  Surgeon: Jovita Gamma, MD;  Location: [place];  Service: Neurosurgery;  Laterality: N/A;   KYPHOPLASTY N/A 06/13/2022   Procedure: Vertobroplasty Thoracic Ten;  Surgeon: Kary Kos, MD;  Location: [facility];  Service: Neurosurgery;  Laterality: N/A;   LAMINECTOMY WITH POSTERIOR LATERAL ARTHRODESIS LEVEL 4 N/A 12/20/2021   Procedure: Thoracic  Ten-Eleven, Thoracic Eleven-Twelve, Thoracic Twelve-Lumbar One, Lumbar One-Two Posterior Lateral Fusion;  Surgeon: Kary Kos, MD;  Location: [facility];  Service: Neurosurgery;  Laterality: N/A;   left rotator cuff repair Left    12/2006   LUMBAR FUSION     x3   LUMBAR LAMINECTOMY     removal of screws  08/2022   TONSILLECTOMY      Current Medications: Current Meds  Medication Sig   acetaminophen (TYLENOL) 500 MG tablet Take 1,000 mg by mouth every 6 (six) hours as needed for moderate pain.   Calcium-Vitamin D-Vitamin K (CALCIUM + D) [phone] MG-UNT-MCG CHEW Chew 1 tablet by mouth daily.   cholecalciferol (VITAMIN D) 25 MCG (1000 UNIT) tablet Take 1,000 Units by mouth daily.    CRANBERRY PO Take 2 tablets by mouth in the morning and at bedtime.   Cyanocobalamin (VITAMIN B-12) 5000 MCG SUBL Place 5,000 mcg under the tongue daily.    gabapentin (NEURONTIN) 300 MG capsule Take 1 capsule by mouth in the morning and take 4 capsules at bedtime.   Glucos-Chondroit-Hyaluron-MSM (GLUCOSAMINE CHONDROITIN JOINT PO) Take 1 tablet by mouth daily.   hydrochlorothiazide (HYDRODIURIL) 12.5 MG tablet Take 12.5 mg by mouth daily.   Lactobacillus-Inulin (CULTURELLE DIGESTIVE DAILY PO) Take 1 tablet by mouth daily.   levothyroxine (SYNTHROID, LEVOTHROID) 25 MCG tablet Take 25 mcg by mouth See admin instructions. Take 25 mcg by mouth on Monday, Wednesday, Fri,day and Saturday only.   loratadine (CLARITIN) 10 MG tablet Take 10 mg by mouth daily as needed for allergies.    Menatetrenone (VITAMIN K2) 100 MCG TABS Take 100 mcg by mouth daily.   methocarbamol (ROBAXIN) 500 MG tablet Take 1 tablet (500 mg total) by mouth 4 (four) times daily. (Patient taking differently: Take 500 mg by mouth as needed for muscle spasms.)   Multiple Vitamins-Minerals (PRESERVISION AREDS 2) CAPS Take 1 tablet by mouth daily.   naproxen sodium (ALEVE) 220 MG tablet Take 220 mg by mouth daily as needed (pain).   nebivolol (BYSTOLIC) 5 MG  tablet Take 5 mg by mouth daily.   Omega-3 Fatty Acids (FISH OIL PO) Take 1 capsule by mouth daily.   Polyethyl Glyc-Propyl Glyc PF (SYSTANE ULTRA PF) 0.4-0.3 % SOLN Place 1 drop into both eyes in the morning.   potassium chloride (KLOR-CON) 10 MEQ tablet Take 1 tablet (10 mEq total) by mouth daily. Take 2 tabs for one week then change to once daily (Patient taking differently: Take 10 mEq by mouth 2 (two) times daily.)   pyridOXINE (VITAMIN B-6) 100 MG tablet Take 100 mg by mouth daily.   vitamin C (ASCORBIC ACID) 500 MG tablet Take 500 mg by mouth daily.    Allergies:   Contrast media [iodinated contrast media], Gadobenate, Other, Amlodipine, Codeine,  Cymbalta [duloxetine hcl], Amoxicillin, and Macrodantin [nitrofurantoin]   Social History   Socioeconomic History   Marital status: Married    Spouse name: Joe   Number of children: 2   Years of education: 13   Highest education level: Not on file  Occupational History   Occupation: retired  Tobacco Use   Smoking status: Never   Smokeless tobacco: Never  Vaping Use   Vaping Use: Never used  Substance and Sexual Activity   Alcohol use: No   Drug use: Not Currently    Comment: CBD- last time -10/2021   Sexual activity: Yes  Other Topics Concern   Not on file  Social History Narrative   Patient is right handed.   Patient drinks 2-3 cups of caffeine per day.   Social Determinants of Health   Financial Resource Strain: Not on file  Food Insecurity: Not on file  Transportation Needs: Not on file  Physical Activity: Not on file  Stress: Not on file  Social Connections: Not on file     Family History:  The patient's family history includes Alcohol abuse in her brother; Breast cancer in her maternal aunt, mother, paternal aunt, and sister; Depression in her paternal grandmother; Diabetes in her mother and sister; Heart Problems in her paternal aunt and paternal grandmother; Hodgkin's lymphoma (age of onset: 76) in her father;  Other in her daughter and sister; Ovarian cancer (age of onset: 33) in her maternal aunt; Skin cancer in her daughter; Stroke in her maternal grandfather.   ROS:   Please see the history of present illness.    ROS All other systems reviewed and are negative.      No data to display             PHYSICAL EXAM:   VS:  BP (!) 162/94   Pulse 79   Ht '4\' 10"'$  (1.473 m)   Wt 162 lb 6.4 oz (73.7 kg)   SpO2 96%   BMI 33.94 kg/m    GEN: Well nourished, well developed, in no acute distress  HEENT: normal  Neck: no JVD, carotid bruits, or masses Cardiac: RRR; no murmurs, rubs, or gallops,no edema.  Intact distal pulses bilaterally.  Respiratory:  clear to auscultation bilaterally, normal work of breathing GI: soft, nontender, nondistended, + BS MS: no deformity or atrophy  Skin: warm and dry, no rash Neuro:  Alert and Oriented x 3, Strength and sensation are intact Psych: euthymic mood, full affect  Wt Readings from Last 3 Encounters:  01/18/23 162 lb 6.4 oz (73.7 kg)  12/12/22 166 lb (75.3 kg)  10/25/22 157 lb 8 oz (71.4 kg)      Studies/Labs Reviewed:   EKG:  EKG is ordered today.  The ekg ordered today demonstrates NSR with low voltage and nonspecific ST abnormality  Recent Labs: 10/27/2022: ALT 17; Hemoglobin 14.8; Platelets 246 10/28/2022: BUN 8; Creatinine, Ser 0.68; Magnesium 1.9; Potassium 2.9; Sodium 134   Lipid Panel No results found for: "CHOL", "TRIG", "HDL", "CHOLHDL", "VLDL", "LDLCALC", "LDLDIRECT"   Additional studies/ records that were reviewed today include:  none    ASSESSMENT:    1. Inappropriate sinus tachycardia   2. Essential hypertension, benign      PLAN:  In order of problems listed above:  Inappropriate sinus tachycardia -She was on beta-blocker therapy when I saw her in 2016 but this was stopped because of fatigue and sleepiness and change to ARB/diuretic for treatment of her blood pressure.  She did not really  have any problems with  tachycardia after stopping the beta-blocker -She is now on Bystolic 5 mg daily and heart rate is good control today  Hypertension -BP is poorly controlled on exam today -Continue prescription drug management with HCTZ 12.5 mg daily -increase Bystolic to '5mg'$  BID -check BP and HR twice daily for a week and call with results -she is intolerant to Amlodipine  Hyponatremia -Na and K+ were low in ER in Dec 23 but was not admitted -she remains on HCTZ -check BMET and Mag  Time Spent: 20 minutes total time of encounter, including 15 minutes spent in face-to-face patient care on the date of this encounter. This time includes coordination of care and counseling regarding above mentioned problem list. Remainder of non-face-to-face time involved reviewing chart documents/testing relevant to the patient encounter and documentation in the medical record. I have independently reviewed documentation from referring provider  Medication Adjustments/Labs and Tests Ordered: Current medicines are reviewed at length with the patient today.  Concerns regarding medicines are outlined above.  Medication changes, Labs and Tests ordered today are listed in the Patient Instructions below.  There are no Patient Instructions on file for this visit.   Signed, Fransico Him, MD  01/18/2023 2:15 PM    [facility] [address], [place], [place]  [address] Phone: [phone]; Fax: [phone]

## 2023-01-19 LAB — BASIC METABOLIC PANEL
BUN/Creatinine Ratio: 13 (ref 12–28)
BUN: 11 mg/dL (ref 8–27)
CO2: 24 mmol/L (ref 20–29)
Calcium: 9.8 mg/dL (ref 8.7–10.3)
Chloride: 95 mmol/L — ABNORMAL LOW (ref 96–106)
Creatinine, Ser: 0.86 mg/dL (ref 0.57–1.00)
Glucose: 84 mg/dL (ref 70–99)
Potassium: 3.8 mmol/L (ref 3.5–5.2)
Sodium: 137 mmol/L (ref 134–144)
eGFR: 69 mL/min/{1.73_m2} (ref >59)

## 2023-01-19 LAB — MAGNESIUM: Magnesium: 1.2 mg/dL — ABNORMAL LOW (ref 1.6–2.3)

## 2023-01-21 ENCOUNTER — Telehealth: Payer: Self-pay

## 2023-01-21 DIAGNOSIS — Z79899 Other long term (current) drug therapy: Secondary | ICD-10-CM

## 2023-01-21 MED ORDER — MAGNESIUM OXIDE 400 MG PO CAPS: 400.0000 mg | ORAL_CAPSULE | Freq: Two times a day (BID) | ORAL | 3 refills | Status: DC

## 2023-01-21 NOTE — Telephone Encounter (Signed)
The patient has been notified of the result and verbalized understanding.  All questions (if any) were answered. [name], RN 01/21/2023 4:33 PM  Rx has been sent in. Labs have been scheduled.

## 2023-01-21 NOTE — Telephone Encounter (Signed)
-----   Message from Sueanne Margarita, MD sent at 01/19/2023  8:00 AM EST ----- Magnesium very low - start Magnesium oxide 437m BID and check Mag level in 1 week>>forward to PCP

## 2023-01-28 ENCOUNTER — Telehealth: Payer: Self-pay | Admitting: Cardiology

## 2023-01-28 NOTE — Telephone Encounter (Signed)
Pt c/o medication issue:  1. Name of Medication:  nebivolol (BYSTOLIC) 5 MG tablet   2. How are you currently taking this medication (dosage and times per day)?   3. Are you having a reaction (difficulty breathing--STAT)?   4. What is your medication issue?   Caller wants to confirm patient's dosage for this medication

## 2023-01-28 NOTE — Telephone Encounter (Signed)
Spoke with pharmacist and confirmed nebivolol prescription for 5 mg twice daily

## 2023-01-29 ENCOUNTER — Ambulatory Visit: Payer: PPO | Attending: Cardiology

## 2023-01-29 DIAGNOSIS — Z79899 Other long term (current) drug therapy: Secondary | ICD-10-CM

## 2023-01-30 ENCOUNTER — Telehealth: Payer: Self-pay

## 2023-01-30 DIAGNOSIS — Z79899 Other long term (current) drug therapy: Secondary | ICD-10-CM

## 2023-01-30 DIAGNOSIS — G894 Chronic pain syndrome: Secondary | ICD-10-CM | POA: Diagnosis not present

## 2023-01-30 DIAGNOSIS — Z9889 Other specified postprocedural states: Secondary | ICD-10-CM | POA: Diagnosis not present

## 2023-01-30 DIAGNOSIS — Z6832 Body mass index (BMI) 32.0-32.9, adult: Secondary | ICD-10-CM | POA: Diagnosis not present

## 2023-01-30 LAB — MAGNESIUM: Magnesium: 1.1 mg/dL — ABNORMAL LOW (ref 1.6–2.3)

## 2023-01-30 NOTE — Telephone Encounter (Signed)
-----   Message from Freada Bergeron, MD sent at 01/30/2023  3:47 PM EST ----- Her magnesium is very low. We need to stop the HCTZ and increase her magnesium supplementation to '800mg'$  twice daily for 5 days and then back to '400mg'$  BID thereafter. Repeat BMET next week to ensure better.   If the magnesium causes diarrhea at the higher dose, then she can take '400mg'$  TID for 5 days and then back to '400mg'$  BID thereafter.

## 2023-01-30 NOTE — Telephone Encounter (Signed)
Called patient to review labs and discuss Dr. Jacolyn Reedy recommendations. Informed patient her magnesium is very low. Patient agrees to to stop the HCTZ and repeat BMET next week. Orders placed, labs scheduled.   Spoke with patient about increasing her magnesium dose, patient states she could not tolerate increased dose of magnesium as it gave her diarrhea. She states she only took one dose of previously ordered 400 mg magnesium because it gave her diarrhea. Reviewed Dr. Jacolyn Reedy suggestion to take '400mg'$  TID for 5 days and then back to '400mg'$  BID thereafter if she feels she cannot tolerate higher doses, patient states she does not feel she can tolerate any dose of magnesium over 250 mg. Forwarded to Dr. Johney Frame.

## 2023-01-31 NOTE — Telephone Encounter (Signed)
Per Dr Johney Frame- Can she manage to take the 250 twice per day? We just need her to take more than she is taking now.   LVM for patient to return call to clinic.

## 2023-02-01 NOTE — Telephone Encounter (Signed)
Called patient to discuss Dr. Jacolyn Reedy recommendation that she try 250 mg BID. Patient states she had tried this dose before and she did have diarrhea. She states she would be willing to try again if she wasn't a caregiver for her spouse right now as he has recently had surgery and needs transportation to PT as well as assistance with ADL's. She states she has been taking 250 mg once a day for the past 2 days with no diarrhea and she has also been eating foods high in magnesium such as almonds and salmon trying to increase her levels. Patient verbalizes signs and symptoms of critically low magnesium such as muscle tremors, spasms and weakness, nausea/vomiting, heart arrhythmias and verbalizes understanding to report to ED if these occur over the weekend. Patient also verbalizes understanding to complete labs scheduled for Wednesday February 06, 2023.

## 2023-02-02 NOTE — Telephone Encounter (Signed)
Holding per pt preference

## 2023-02-06 ENCOUNTER — Other Ambulatory Visit: Payer: PPO

## 2023-02-11 ENCOUNTER — Telehealth: Payer: Self-pay | Admitting: Cardiology

## 2023-02-11 DIAGNOSIS — Z79899 Other long term (current) drug therapy: Secondary | ICD-10-CM

## 2023-02-11 NOTE — Telephone Encounter (Signed)
Received patient schedule message:  "This is the second time I was supposed to come for just blood work. I told Danae Chen that I was having trouble with taking magnesium and causing stomach upsets and that I would continue to try to take magnesium. It was still bothering me and there is no reason to take a test if all I am supposed to take magnesium and I can not take the medicine. Also it is hard to get messages to [place] with the phone system that you have. I have been sick for over a week with coughing and a terrible cold. My husband has had this cold for over 2 weeks and we have been to the doctor over 6 times due to his knee replacement and problems that he has had from the surgery. I am also still having problems with higher blood pressure and I do not know what to do. I would be glad to talk to [place] if I could get through the phone system. I need help but I do not know what to do. If you still want me to come in for just magnesium I will be glad to now that the cough is under control but I cannot take magnesium. I need to know what to do. Thank you! Audrey [name] "

## 2023-02-11 NOTE — Telephone Encounter (Signed)
Called patient to reschedule labs. Advised patient that she needs to complete magnesium labs as soon as possible as low magnesium can cause heart arrhythmias, nausea and weakness. Patient denies any palpitations, weakness, nausea, chest pain, or SOB at this time and states she is feeling better. She states she has had no diarrhea for the past week as she has avoided magnesium supplements, but she has adhered to her diet items that are high in magnesium in an attempt to get her levels up. She states she will be able to make it to the lab tomorrow to complete labs. Orders placed, lab visit scheduled. Advised patient to report to ER if chest pain, SOB, palpitations or weakness develop.

## 2023-02-12 ENCOUNTER — Ambulatory Visit: Payer: PPO | Attending: Cardiology

## 2023-02-12 DIAGNOSIS — Z79899 Other long term (current) drug therapy: Secondary | ICD-10-CM | POA: Diagnosis not present

## 2023-02-13 ENCOUNTER — Telehealth: Payer: Self-pay

## 2023-02-13 LAB — BASIC METABOLIC PANEL
BUN/Creatinine Ratio: 11 — ABNORMAL LOW (ref 12–28)
BUN: 9 mg/dL (ref 8–27)
CO2: 24 mmol/L (ref 20–29)
Calcium: 9.7 mg/dL (ref 8.7–10.3)
Chloride: 102 mmol/L (ref 96–106)
Creatinine, Ser: 0.82 mg/dL (ref 0.57–1.00)
Glucose: 87 mg/dL (ref 70–99)
Potassium: 4.3 mmol/L (ref 3.5–5.2)
Sodium: 141 mmol/L (ref 134–144)
eGFR: 73 mL/min/{1.73_m2} (ref >59)

## 2023-02-13 LAB — SPECIMEN STATUS REPORT

## 2023-02-13 LAB — MAGNESIUM: Magnesium: 1.3 mg/dL — ABNORMAL LOW (ref 1.6–2.3)

## 2023-02-13 NOTE — Telephone Encounter (Signed)
Patient states diarrhea started sometime last year.

## 2023-02-13 NOTE — Telephone Encounter (Signed)
-----   Message from Sueanne Margarita, MD sent at 02/13/2023 11:22 AM EDT ----- I would like her referred to Nephrology to determine why she is wasting potassium and is not on diuretics ----- Message ----- From: Nuala Alpha, LPN Sent: [id number]  11:07 AM EDT To: Sueanne Margarita, MD; Joni Reining, RN  Please advise and follow-up with the pt on any additional recommendations.  ----- Message ----- From: Freada Bergeron, MD Sent: 02/13/2023   7:23 AM EDT To: Nuala Alpha, LPN  Kidney function and electrolytes look great

## 2023-02-13 NOTE — Telephone Encounter (Signed)
Called patient to discuss recommendation that she be referred to nephrology to assess if low magnesium is related to renal dysfunction. Patient verbalizes understanding and agrees to plan. Referral placed.

## 2023-02-18 ENCOUNTER — Emergency Department (HOSPITAL_COMMUNITY): Payer: PPO

## 2023-02-18 ENCOUNTER — Telehealth: Payer: Self-pay | Admitting: Cardiology

## 2023-02-18 ENCOUNTER — Emergency Department (HOSPITAL_COMMUNITY)
Admission: EM | Admit: 2023-02-18 | Discharge: 2023-02-18 | Disposition: A | Discharge status: Home / Self Care | Payer: PPO | Attending: Emergency Medicine | Admitting: Emergency Medicine

## 2023-02-18 ENCOUNTER — Telehealth: Payer: Self-pay

## 2023-02-18 DIAGNOSIS — I160 Hypertensive urgency: Secondary | ICD-10-CM | POA: Diagnosis not present

## 2023-02-18 DIAGNOSIS — G4489 Other headache syndrome: Secondary | ICD-10-CM | POA: Diagnosis not present

## 2023-02-18 DIAGNOSIS — Z79899 Other long term (current) drug therapy: Secondary | ICD-10-CM | POA: Diagnosis not present

## 2023-02-18 DIAGNOSIS — E876 Hypokalemia: Secondary | ICD-10-CM | POA: Diagnosis not present

## 2023-02-18 DIAGNOSIS — I10 Essential (primary) hypertension: Secondary | ICD-10-CM | POA: Diagnosis not present

## 2023-02-18 DIAGNOSIS — R519 Headache, unspecified: Secondary | ICD-10-CM | POA: Diagnosis not present

## 2023-02-18 DIAGNOSIS — R030 Elevated blood-pressure reading, without diagnosis of hypertension: Secondary | ICD-10-CM | POA: Diagnosis not present

## 2023-02-18 LAB — CBC
HCT: 41.1 % (ref 36.0–46.0)
Hemoglobin: 14.2 g/dL (ref 12.0–15.0)
MCH: 30.1 pg (ref 26.0–34.0)
MCHC: 34.5 g/dL (ref 30.0–36.0)
MCV: 87.3 fL (ref 80.0–100.0)
Platelets: 383 10*3/uL (ref 150–400)
RBC: 4.71 MIL/uL (ref 3.87–5.11)
RDW: 12.5 % (ref 11.5–15.5)
WBC: 7.9 10*3/uL (ref 4.0–10.5)
nRBC: 0 % (ref 0.0–0.2)

## 2023-02-18 LAB — MAGNESIUM: Magnesium: 1.3 mg/dL — ABNORMAL LOW (ref 1.7–2.4)

## 2023-02-18 LAB — COMPREHENSIVE METABOLIC PANEL
ALT: 15 U/L (ref 0–44)
AST: 24 U/L (ref 15–41)
Albumin: 3.9 g/dL (ref 3.5–5.0)
Alkaline Phosphatase: 65 U/L (ref 38–126)
Anion gap: 12 (ref 5–15)
BUN: 11 mg/dL (ref 8–23)
CO2: 25 mmol/L (ref 22–32)
Calcium: 9.3 mg/dL (ref 8.9–10.3)
Chloride: 102 mmol/L (ref 98–111)
Creatinine, Ser: 0.77 mg/dL (ref 0.44–1.00)
GFR, Estimated: >60 mL/min (ref >60)
Glucose, Bld: 98 mg/dL (ref 70–99)
Potassium: 3.3 mmol/L — ABNORMAL LOW (ref 3.5–5.1)
Sodium: 139 mmol/L (ref 135–145)
Total Bilirubin: 0.9 mg/dL (ref 0.3–1.2)
Total Protein: 7 g/dL (ref 6.5–8.1)

## 2023-02-18 LAB — TROPONIN I (HIGH SENSITIVITY)
Troponin I (High Sensitivity): 6 ng/L (ref <18)
Troponin I (High Sensitivity): 7 ng/L (ref <18)

## 2023-02-18 MED ORDER — POTASSIUM CHLORIDE CRYS ER 20 MEQ PO TBCR
40.0000 meq | EXTENDED_RELEASE_TABLET | Freq: Once | ORAL | Status: AC
  Administered 2023-02-18: 40 meq via ORAL
  Filled 2023-02-18: qty 2

## 2023-02-18 MED ORDER — MAGNESIUM SULFATE 2 GM/50ML IV SOLN
2.0000 g | Freq: Once | INTRAVENOUS | Status: AC
  Administered 2023-02-18: 2 g via INTRAVENOUS
  Filled 2023-02-18: qty 50

## 2023-02-18 NOTE — Telephone Encounter (Signed)
Called patient to discuss a recent referral made by Dr. Radford Pax, patient stating, "Hello, you must have received my message." She states she had written in on Mychart requesting advice as her BP has been very high since yesterday, Sunday February 17, 2023. Patient states she had a headache for most of yesterday and when she checked her BP throughout the day it was consistently 200/100. She states she has not checked it today but she does have a headache. I asked her to go ahead and check her BP with me on the phone, she reported it was 216/125. She stated she had not taken any of her bystolic today. I advised her to go ahead and take her bystolic and then call EMS. Provided education that patient's BP was dangerously high and that her headache was of concern. Advised of risk of stroke. Patient verbalizes understanding and agrees to call EMS.

## 2023-02-18 NOTE — Discharge Instructions (Signed)
Please call and follow-up closely with your primary care provider and with your cardiologist for further managements of your elevated blood pressure as well as low magnesium level.  You would likely benefit from a second blood pressure medication for better blood pressure control.  Return to the ER if you have any concern.

## 2023-02-18 NOTE — ED Notes (Signed)
Pt ambulatory to and from the bathroom with a slow steady gait, pt states that she she was in the bathroom the floor was sticky and she stripped and bumped her head against the wall, states that she did not fall to the ground, but bumped her head against the wall, pt has a aprox 2inch linier hematoma to her L forehead, md notified, MD at bedside to eval pt, ice pack given, charge rn notified.

## 2023-02-18 NOTE — Telephone Encounter (Signed)
  Per MyChart scheduling message:  I spoke with Audrey [name] about the results of my tests, but on Sunday I had some blood pressure results that I am concerned about and I do not know what to do about them. My blood pressure has been high before but on Sunday at 5:00 it was 210/113. At 7:00 it was 200/110 and at 9:00 it was 204/109. I am also having a headache on the left side of my head. I do not know what to do so I am sending this message. I also have send this info to Dr. Doristine Bosworth at [facility]. I need help to know what I should do. Thank [name] [phone]

## 2023-02-18 NOTE — Telephone Encounter (Signed)
Called patient to discuss canceling renal consult, patient answered and unfortunately was reporting emergent symptoms. See telephone note from today for full details. Was not able to discuss canceling renal consult, advised patient to proceed to ED.

## 2023-02-18 NOTE — ED Provider Notes (Signed)
[place] EMERGENCY DEPARTMENT AT [facility] Provider Note   CSN: [id number] Arrival date & time: 02/18/23  1016     History  Chief Complaint  Patient presents with   Hypertension    Audrey [name] is a 82 y.o. female.  The history is provided by the patient and medical records. No language interpreter was used.  Hypertension    Patient is a 82 yo female with past medical history of HTN presenting with elevated blood pressure. She says she was checking her BP yesterday at home and it was 200/110. She messaged her doctor who told her to come to the ED today. She currently complains that she "feels off". She reports a L sided sharp headache over her eye last night that was short lived and resolved on its own. She has had high BP readings for 6 months usually running in the 160-170 range. She reports she was told to stop her HCTZ 1 week ago due to low magnesium. She has been getting her magnesium checked weekly with her cardiologist and says she can't take a supplement due to bad diarrhea. She reports she was sick two weeks ago with a cold and has a lingering cough. She has been taking Robitussin at night as needed for her cough to help her sleep. Patient denies fever, chills, congestion, shortness of breath, chest pain, palpitations, vision changes, nausea/vomiting, and urinary complaints.   Home Medications Prior to Admission medications   Medication Sig Start Date End Date Taking? Authorizing Provider  acetaminophen (TYLENOL) 500 MG tablet Take 1,000 mg by mouth every 6 (six) hours as needed for moderate pain.    [provider]  Calcium-Vitamin D-Vitamin K (CALCIUM + D) [phone] MG-UNT-MCG CHEW Chew 1 tablet by mouth daily.    [provider]  cholecalciferol (VITAMIN D) 25 MCG (1000 UNIT) tablet Take 1,000 Units by mouth daily.     [provider]  CRANBERRY PO Take 2 tablets by mouth in the morning and at bedtime.    [provider]  Cyanocobalamin (VITAMIN B-12) 5000 MCG SUBL Place 5,000 mcg under the tongue daily.     [provider]  gabapentin (NEURONTIN) 300 MG capsule Take 1 capsule by mouth in the morning and take 4 capsules at bedtime. 12/27/22   Ward Givens, NP  Glucos-Chondroit-Hyaluron-MSM (GLUCOSAMINE CHONDROITIN JOINT PO) Take 1 tablet by mouth daily.    [provider]  Lactobacillus-Inulin (CULTURELLE DIGESTIVE DAILY PO) Take 1 tablet by mouth daily.    [provider]  levothyroxine (SYNTHROID, LEVOTHROID) 25 MCG tablet Take 25 mcg by mouth See admin instructions. Take 25 mcg by mouth on Monday, Wednesday, Fri,day and Saturday only.    [provider]  loratadine (CLARITIN) 10 MG tablet Take 10 mg by mouth daily as needed for allergies.     [provider]  Magnesium Oxide 400 MG CAPS Take 1 capsule (400 mg total) by mouth in the morning and at bedtime. 01/21/23   Sueanne Margarita, MD  Menatetrenone (VITAMIN K2) 100 MCG TABS Take 100 mcg by mouth daily.    [provider]  methocarbamol (ROBAXIN) 500 MG tablet Take 1 tablet (500 mg total) by mouth 4 (four) times daily. Patient taking differently: Take 500 mg by mouth as needed for muscle spasms. 12/21/21   Meyran, Ocie Cornfield, NP  Multiple Vitamins-Minerals (PRESERVISION AREDS 2) CAPS Take 1 tablet by mouth daily.    [provider]  naproxen sodium (ALEVE) 220 MG  tablet Take 220 mg by mouth daily as needed (pain).    [provider]  nebivolol (BYSTOLIC) 5 MG tablet Take 1 tablet (5 mg total) by mouth in the morning and at bedtime. 01/18/23   Sueanne Margarita, MD  Omega-3 Fatty Acids (FISH OIL PO) Take 1 capsule by mouth daily.    [provider]  Polyethyl Glyc-Propyl Glyc PF (SYSTANE ULTRA PF) 0.4-0.3 % SOLN Place 1 drop into both eyes in the morning.    [provider]  potassium chloride (KLOR-CON) 10 MEQ tablet Take 1 tablet (10 mEq total) by mouth daily. Take 2  tabs for one week then change to once daily Patient taking differently: Take 10 mEq by mouth 2 (two) times daily. 01/05/21   Truitt Merle, MD  pyridOXINE (VITAMIN B-6) 100 MG tablet Take 100 mg by mouth daily.    [provider]  vitamin C (ASCORBIC ACID) 500 MG tablet Take 500 mg by mouth daily.    [provider]      Allergies    Contrast media [iodinated contrast media], Gadobenate, Other, Amlodipine, Codeine, Cymbalta [duloxetine hcl], Amoxicillin, and Macrodantin [nitrofurantoin]    Review of Systems   Review of Systems  All other systems reviewed and are negative.   Physical Exam Updated Vital Signs BP (!) 181/114   Pulse 80   Temp 97.8 F (36.6 C) (Oral)   Resp 16   SpO2 98%  Physical Exam Vitals and nursing note reviewed.  Constitutional:      General: She is not in acute distress.    Appearance: She is well-developed.  HENT:     Head: Atraumatic.  Eyes:     Conjunctiva/sclera: Conjunctivae normal.  Cardiovascular:     Rate and Rhythm: Normal rate and regular rhythm.     Pulses: Normal pulses.     Heart sounds: Normal heart sounds.  Pulmonary:     Effort: Pulmonary effort is normal.     Breath sounds: No wheezing, rhonchi or rales.  Abdominal:     Palpations: Abdomen is soft.     Tenderness: There is no abdominal tenderness.  Musculoskeletal:     Cervical back: Neck supple.     Right lower leg: No edema.     Left lower leg: No edema.  Skin:    Findings: No rash.  Neurological:     Mental Status: She is alert. Mental status is at baseline.  Psychiatric:        Mood and Affect: Mood normal.     ED Results / Procedures / Treatments   Labs (all labs ordered are listed, but only abnormal results are displayed) Labs Reviewed  COMPREHENSIVE METABOLIC PANEL - Abnormal; Notable for the following components:      Result Value   Potassium 3.3 (*)    All other components within normal limits  MAGNESIUM - Abnormal; Notable for the following  components:   Magnesium 1.3 (*)    All other components within normal limits  CBC  TROPONIN I (HIGH SENSITIVITY)  TROPONIN I (HIGH SENSITIVITY)    EKG EKG Interpretation  Date/Time:  Monday February 18 2023 11:08:38 EDT Ventricular Rate:  66 PR Interval:  135 QRS Duration: 88 QT Interval:  385 QTC Calculation: 404 R Axis:   44 Text Interpretation: Sinus rhythm Nonspecific T wave abnormality No significant change since last tracing Confirmed by Garnette Gunner [id number] on 02/18/2023 11:48:20 AM  Radiology CT Head Wo Contrast  Result Date: 02/18/2023 CLINICAL DATA:  Headache,  history of cancer. EXAM: CT HEAD WITHOUT CONTRAST TECHNIQUE: Contiguous axial images were obtained from the base of the skull through the vertex without intravenous contrast. RADIATION DOSE REDUCTION: This exam was performed according to the departmental dose-optimization program which includes automated exposure control, adjustment of the mA and/or kV according to patient size and/or use of iterative reconstruction technique. COMPARISON:  MRI brain 07/31/2018. FINDINGS: Brain: No acute intracranial hemorrhage. Severe chronic small-vessel disease. Cortical gray-white differentiation is preserved. No hydrocephalus or extra-axial collection. No mass effect or midline shift. Vascular: No hyperdense vessel or unexpected calcification. Skull: No calvarial fracture or suspicious bone lesion. Skull base is unremarkable. Sinuses/Orbits: Unremarkable. Other: None. IMPRESSION: 1. No acute intracranial abnormality. 2. Severe chronic small-vessel disease. Electronically Signed   By: Emmit Alexanders M.D.   On: 02/18/2023 13:03   DG Chest Port 1 View  Result Date: 02/18/2023 CLINICAL DATA:  Elevated blood pressure. EXAM: PORTABLE CHEST 1 VIEW COMPARISON:  Chest x-ray dated 02/08/2022 FINDINGS: Heart size and mediastinal contours are grossly stable. Lungs are clear. No pleural effusion or pneumothorax is seen. No acute-appearing osseous  abnormality. IMPRESSION: No active disease. No evidence of pneumonia or pulmonary edema. Electronically Signed   By: Franki Cabot M.D.   On: 02/18/2023 11:13    Procedures Procedures    Medications Ordered in ED Medications  magnesium sulfate IVPB 2 g 50 mL (2 g Intravenous New Bag/Given 02/18/23 1500)  potassium chloride SA (KLOR-CON M) CR tablet 40 mEq (40 mEq Oral Given 02/18/23 1510)    ED Course/ Medical Decision Making/ A&P                             Medical Decision Making Amount and/or Complexity of Data Reviewed Labs: ordered. Radiology: ordered.  Risk Prescription drug management.   BP (!) 161/100 (BP Location: Right Arm)   Pulse 65   Temp 97.7 F (36.5 C) (Oral)   Resp 13   SpO2 98%   This is a 82 year old female significant history of hypertension, GERD, fibromyalgia, neuropathy, presenting with concerns of elevated blood pressure.  Patient states she intermittently checks her blood pressure after she was hospitalized in the earlier part of the year due to having low sodium and chloride and magnesium level.  She mention within the past week she noticed her blood pressure has risen ranging from 0000000 systolic to now [id number] systolic this morning.  She was previously taken off a fluid pill, hydrochlorothiazide a week ago due to low magnesium level.  She also recently get over a cold.  She has been taking Delsym and Robitussin DM as needed for cough.  Last use was last night.  She denies any significant headache except for some mild transient discomfort to her left temporal yesterday that lasted less than a minute.  No complaints of lightheadedness, dizziness, chest pain, shortness of breath, abdominal pain, focal numbness or focal weakness.  She did reach out to her PCP via text message last night due to her elevated blood pressure and was recommended to come to the ER for further assessment.  On exam this is a well-appearing female resting comfortably in bed appears to be in no  acute discomfort.  No nuchal rigidity, no pulsatile carotid mass, heart with normal rate and rhythm lungs are clear to auscultation bilaterally abdomen is soft nontender, equal strength throughout, she is alert and oriented x 4.  Patient is resting comfortably talking on the phone with her  daughter prior to me entering the room.  Vitals are remarkable for elevated blood pressure of 188/91.  She is afebrile, no tachycardia, no fever.  Suspect headache is hypertensive urgency, low suspicion for hypertensive emergency however since patient has had electrolyte derangement in the past, we will recheck her labs including a magnesium level.  Patient gave me a sheet of blood pressures measure that she has been taking for the past week and her systolic is ranging from [id number] systolic to as high as [id number] systolic.  -Labs ordered, independently viewed and interpreted by me.  Labs remarkable for K+ 3.3, supplementation given.  Mg 1.3 similar to prior, will provide supplement -The patient was maintained on a cardiac monitor.  I personally viewed and interpreted the cardiac monitored which showed an underlying rhythm of: NSR -Imaging independently viewed and interpreted by me and I agree with radiologist's interpretation.  Result remarkable for portable CXR without acute changes, head CT scan unremarkable -This patient presents to the ED for concern of HTN, this involves an extensive number of treatment options, and is a complaint that carries with it a high risk of complications and morbidity.  The differential diagnosis includes drug induced, HTN, noncompliment with food,  -Co morbidities that complicate the patient evaluation includes HTN -Treatment includes magnesium and potassium supplementation -Reevaluation of the patient after these medicines showed that the patient improved -PCP office notes or outside notes reviewed -Discussion with attending Dr. Truett Mainland -Escalation to admission/observation considered:  patients feels much better, is comfortable with discharge, and will follow up with PCP -Prescription medication considered, patient comfortable with her BP medication -Social Determinant of Health considered   3:59 PM No evidence concerning for hypertensive emergency.  Blood pressure improved without blood pressure medication.  Patient received supplementation for magnesium and potassium.  Outpatient follow-up recommended.         Final Clinical Impression(s) / ED Diagnoses Final diagnoses:  Hypertensive urgency  Hypomagnesemia  Hypokalemia    Rx / DC Orders ED Discharge Orders     None         Domenic Moras, PA-C 02/18/23 1559    Cristie Hem, MD 02/19/23 1356

## 2023-02-18 NOTE — ED Triage Notes (Signed)
Pt reports being advised to check her BP daily from cardiologist as well as weekly blood draws for low magnesium. Pt states that yesterday her BP at home was >200/110. Pt endorsed a L sided headache at that time with no vision changes, CP, SOB.

## 2023-02-18 NOTE — ED Provider Notes (Signed)
  Physical Exam  BP (!) 161/100 (BP Location: Right Arm)   Pulse 65   Temp 97.7 F (36.5 C) (Oral)   Resp 13   SpO2 98%   I evaluated the patient bedside as I was informed by nursing staff that the patient had slipped and fallen in the bathroom prior to her discharge.  She had previously been evaluated earlier this afternoon and had negative CT imaging of the head.  She been complaining of mild headache associated with high blood pressure.  The patient states that she slipped on the wet floor in the bathroom and fell forward onto a mirror striking her left forehead on the mirror.  On exam, the patient was alert and oriented x 3, GCS 15, with a small hematoma to the left forehead without palpable skull depression.  Patient has no nausea and vomiting.  Given her age, offered repeat CT imaging of the head which the patient declined.  On neurologic exam, the patient has no cranial nerve deficit, 5 out of 5 strength in all 4 extremities with no sensory deficit.  No dysmetria.  She denies any syncope.  Per patient preference, will defer CT imaging of the head at this time.  The patient was advised strict return precautions the event of any worsening symptoms which the patient and husband bedside acknowledged.       Regan Lemming, MD 02/18/23 [phone]

## 2023-02-20 NOTE — Progress Notes (Signed)
Cardiology Clinic Note   Patient Name: Audrey [name] Date of Encounter: 02/22/2023  Primary Care Provider:  Mckinley Jewel, MD Primary Cardiologist:  Fransico Him, MD  Patient Profile    82 year old female with history of sinus tachycardia, hypertension, unable to tolerate BB due to fatigue and sleepiness, hypothyroidism,.   Most recent echocardiogram 04/16/2012 EF of normal with Grade I diastolic dysfunction. Seen in ED on 02/18/2023 for hypertension, BP at 161/100, after sustaining mechanical fall in her bathroom, hitting her head on the mirror. CT was negative for acute intracranial abnormality. Labs revealed hypomagnesia (1.3) and hypokalemia 3.3).   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   Hypertension    Hypothyroidism    Obesity    PONV (postoperative nausea and vomiting)    Reflux    buring and dysphagia sx resp to PPI; egd 3/06 neg for Barretts   Tachycardia     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   Yeast infection    hx of    Past Surgical History:  Procedure Laterality Date   AXILLARY LYMPH NODE BIOPSY Left 12/27/2015   Procedure: LEFT AXILLARY SENTINEL LYMPH NODE BIOPSY;  Surgeon: [name], MD;  Location: [place];  Service: General;  Laterality: Left;   BELPHAROPTOSIS REPAIR Bilateral    BREAST LUMPECTOMY WITH RADIOACTIVE SEED LOCALIZATION Left 12/01/2015   Procedure: LEFT BREAST [place] RADIOACTIVE SEED LOCALIZATION;  Surgeon: [name],  MD;  Location: [place];  Service: General;  Laterality: Left;   BREAST SURGERY     CATARACT EXTRACTION, BILATERAL Bilateral 08/2018   COLONOSCOPY  01/2005   NEG   cortisone injections     right arm; back   EYE SURGERY     cosmetic; eyelid surgery bilat    FINGER SURGERY Left    left index finger/ cyst 02/2007   HARDWARE REMOVAL N/A 06/13/2022   Procedure: Bilateral Thoracic Ten- Removal of Screws;  Surgeon: Kary Kos, MD;  Location: [place];  Service: Neurosurgery;  Laterality: N/A;   KNEE ARTHROSCOPY Right 04/13/2015   Procedure: ARTHROSCOPY RIGHT KNEE WITH MEDIAL MENISCAL DEBRIDEMENT AND CHONDROPLASTY;  Surgeon: Gaynelle Arabian, MD;  Location: [facility];  Service: Orthopedics;  Laterality: Right;   KYPHOPLASTY N/A 03/16/2020   Procedure: KYPHOPLASTY THORACIC THREE, THORACIC FOUR, THORACIC SIX;  Surgeon: Jovita Gamma, MD;  Location: [place];  Service: Neurosurgery;  Laterality: N/A;   KYPHOPLASTY N/A 06/13/2022   Procedure: Vertobroplasty Thoracic Ten;  Surgeon: Kary Kos, MD;  Location: [place];  Service: Neurosurgery;  Laterality: N/A;   LAMINECTOMY WITH POSTERIOR LATERAL ARTHRODESIS LEVEL 4 N/A 12/20/2021   Procedure: Thoracic Ten-Eleven, Thoracic Eleven-Twelve, Thoracic Twelve-Lumbar One, Lumbar One-Two Posterior Lateral Fusion;  Surgeon: Kary Kos, MD;  Location: [place];  Service: Neurosurgery;  Laterality: N/A;   left rotator cuff repair Left    12/2006   LUMBAR  FUSION     x3   LUMBAR LAMINECTOMY     removal of screws  08/2022   TONSILLECTOMY      Allergies  Allergies  Allergen Reactions   Contrast Media [Iodinated Contrast Media] Shortness Of Breath   Gadobenate Anaphylaxis   Other Swelling    Cat gut sutures Cats and dogs                Derma bond- intolerance- itching- tolerates with Benadryl       Amlodipine Other (See Comments)    Made pt overly sleepy stated "she could not function"   Codeine Other (See Comments)    "Drives me crazy"   Cymbalta [Duloxetine Hcl]  Diarrhea   Amoxicillin Nausea And Vomiting and Rash   Macrodantin [Nitrofurantoin] Rash    History of Present Illness    Audrey [name] is here for ongoing assessment and management of hypertension and inappropriate tachycardia unable to tolerate BB due to fatigue. She was seen in ED on 02/18/2023 for hypertension after having a mechanical fall in her bathroom, hitting her head on the mirror. No intracranial injury was noted on CT.   Audrey [name] comes today without any new complaints.  She has chronic complaints of back pain and left lateral pain below her ribs which has been an ongoing aggravation to her for many years after frequent back surgeries.  She also complains of explosive diarrhea and is being followed up with GI in the next couple of weeks for further evaluation.  She at this time is stopping gluten and dairy until being seen.  She has been taken off HCTZ due to hypokalemia and hypomagnesemia and remains only on Bystolic.  Home Medications    Current Outpatient Medications  Medication Sig Dispense Refill   acetaminophen (TYLENOL) 500 MG tablet Take 1,000 mg by mouth every 6 (six) hours as needed for moderate pain.     Calcium-Vitamin D-Vitamin K (CALCIUM + D) [phone] MG-UNT-MCG CHEW Chew 1 tablet by mouth daily.     cholecalciferol (VITAMIN D) 25 MCG (1000 UNIT) tablet Take 1,000 Units by mouth daily.      CRANBERRY PO Take 2 tablets by mouth in the morning and at bedtime.     Cyanocobalamin (VITAMIN B-12) 5000 MCG SUBL Place 5,000 mcg under the tongue daily.      gabapentin (NEURONTIN) 300 MG capsule Take 1 capsule by mouth in the morning and take 4 capsules at bedtime. 450 capsule 2   Glucos-Chondroit-Hyaluron-MSM (GLUCOSAMINE CHONDROITIN JOINT PO) Take 1 tablet by mouth daily.     Lactobacillus-Inulin (CULTURELLE DIGESTIVE DAILY PO) Take 1 tablet by mouth daily.     levothyroxine (SYNTHROID, LEVOTHROID) 25 MCG tablet Take 25 mcg by mouth See admin instructions. Take 25 mcg by  mouth on Monday, Wednesday, Fri,day and Saturday only.     loratadine (CLARITIN) 10 MG tablet Take 10 mg by mouth daily as needed for allergies.      Magnesium Oxide 400 MG CAPS Take 1 capsule (400 mg total) by mouth in the morning and at bedtime. 180 capsule 3   Menatetrenone (VITAMIN K2) 100 MCG TABS Take 100 mcg by mouth daily.     methocarbamol (ROBAXIN) 500 MG tablet Take 1 tablet (500 mg total) by mouth 4 (four) times daily. (Patient taking differently: Take 500 mg by mouth as needed for muscle spasms.) 45 tablet 0   Multiple Vitamins-Minerals (PRESERVISION AREDS 2) CAPS Take 1 tablet by mouth daily.     naproxen sodium (ALEVE)  220 MG tablet Take 220 mg by mouth daily as needed (pain).     nebivolol (BYSTOLIC) 5 MG tablet Take 1 tablet (5 mg total) by mouth in the morning and at bedtime. 180 tablet 3   Omega-3 Fatty Acids (FISH OIL PO) Take 1 capsule by mouth daily.     Polyethyl Glyc-Propyl Glyc PF (SYSTANE ULTRA PF) 0.4-0.3 % SOLN Place 1 drop into both eyes in the morning.     potassium chloride (KLOR-CON) 10 MEQ tablet Take 1 tablet (10 mEq total) by mouth daily. Take 2 tabs for one week then change to once daily (Patient taking differently: Take 10 mEq by mouth 2 (two) times daily.) 60 tablet 2   pyridOXINE (VITAMIN B-6) 100 MG tablet Take 100 mg by mouth daily.     spironolactone (ALDACTONE) 25 MG tablet Take 0.5 tablets (12.5 mg total) by mouth daily. 15 tablet 0   vitamin C (ASCORBIC ACID) 500 MG tablet Take 500 mg by mouth daily.     No current facility-administered medications for this visit.     Family History    Family History  Problem Relation Age of Onset   Diabetes Mother    Breast cancer Mother        dx. late 21s-early 38s; "metastasis to throat"?   Hodgkin's lymphoma Father 4   Breast cancer Sister        maternal half-sister dx. w/ DCIS in her late 16s;   Other Sister        learning disabilities; lives on her own   Diabetes Sister    Alcohol abuse Brother     Breast cancer Maternal Aunt        dx. 87s   Ovarian cancer Maternal Aunt 59   Breast cancer Paternal Aunt        dx. early 66s   Heart Problems Paternal Aunt    Stroke Maternal Grandfather    Depression Paternal Grandmother    Heart Problems Paternal Grandmother    Skin cancer Daughter        dx. early 26s   Other Daughter        insterstitial cystitis   Neuropathy Neg Hx    She indicated that her mother is deceased. She indicated that her father is deceased. She indicated that her sister is alive. She indicated that her brother is deceased. She indicated that her maternal grandmother is deceased. She indicated that her maternal grandfather is deceased. She indicated that her paternal grandmother is deceased. She indicated that her paternal grandfather is deceased. She indicated that her daughter is alive. She indicated that her son is alive. She indicated that all of her five maternal aunts are deceased. She indicated that her maternal uncle is deceased. She indicated that both of her paternal aunts are deceased. She indicated that her paternal uncle is deceased. She indicated that the status of her neg hx is unknown.  Social History    Social History   Socioeconomic History   Marital status: Married    Spouse name: Joe   Number of children: 2   Years of education: 13   Highest education level: Not on file  Occupational History   Occupation: retired  Tobacco Use   Smoking status: Never   Smokeless tobacco: Never  Vaping Use   Vaping Use: Never used  Substance and Sexual Activity   Alcohol use: No   Drug use: Not Currently    Comment: CBD- last time -10/2021   Sexual activity:  Yes  Other Topics Concern   Not on file  Social History Narrative   Patient is right handed.   Patient drinks 2-3 cups of caffeine per day.   Social Determinants of Health   Financial Resource Strain: Not on file  Food Insecurity: Not on file  Transportation Needs: Not on file  Physical  Activity: Not on file  Stress: Not on file  Social Connections: Not on file  Intimate Partner Violence: Not on file     Review of Systems    General:  No chills, fever, night sweats or weight changes.  Left lateral temporal ecchymosis, which is fading. Cardiovascular:  No chest pain, dyspnea on exertion, edema, orthopnea, palpitations, paroxysmal nocturnal dyspnea. Dermatological: No rash, lesions/masses Respiratory: No cough, dyspnea Urologic: No hematuria, dysuria Abdominal:   No nausea, vomiting, diarrhea, bright red blood per rectum, melena, or hematemesis Neurologic:  No visual changes, wkns, changes in mental status. All other systems reviewed and are otherwise negative except as noted above.     Physical Exam    VS:  BP (!) 156/92 (BP Location: Right Arm, Patient Position: Sitting, Cuff Size: Normal)   Pulse 76   Ht 4\' 10"  (1.473 m)   Wt 159 lb 9.6 oz (72.4 kg)   SpO2 96%   BMI 33.36 kg/m  , BMI Body mass index is 33.36 kg/m.     GEN: Well nourished, well developed, in no acute distress. HEENT: normal. Neck: Supple, no JVD, carotid bruits, or masses. Cardiac: RRR, no murmurs, rubs, or gallops. No clubbing, cyanosis, 1+ dependent edema.  Radials/DP/PT 2+ and equal bilaterally.  Respiratory:  Respirations regular and unlabored, clear to auscultation bilaterally. GI: Soft, nontender, nondistended, BS + x 4. MS: no deformity or atrophy. Skin: warm and dry, no rash. Neuro:  Strength and sensation are intact. Psych: Normal affect.  Accessory Clinical Findings    ECG personally reviewed by me today-not completed this office visit  Lab Results  Component Value Date   WBC 7.9 02/18/2023   HGB 14.2 02/18/2023   HCT 41.1 02/18/2023   MCV 87.3 02/18/2023   PLT 383 02/18/2023   Lab Results  Component Value Date   CREATININE 0.77 02/18/2023   BUN 11 02/18/2023   NA 139 02/18/2023   K 3.3 (L) 02/18/2023   CL 102 02/18/2023   CO2 25 02/18/2023   Lab Results   Component Value Date   ALT 15 02/18/2023   AST 24 02/18/2023   ALKPHOS 65 02/18/2023   BILITOT 0.9 02/18/2023   No results found for: "CHOL", "HDL", "LDLCALC", "LDLDIRECT", "TRIG", "CHOLHDL"  No results found for: "HGBA1C"  Review of Prior Studies:  See scanned documents from 2013. Assessment & Plan   1.  Hypertension: Has been difficult to control, does not tolerate a number of different medications.  Was hypertensive and seen in the ED a couple weeks ago, then took a fall while in the ED injuring her left temporal area.  No laceration only bruising seen.  Due to hypokalemia and hypomagnesemia she was taken off of HCTZ.  I will start her on spironolactone 12.5 mg daily and continue Bystolic as directed.  I will see her back in 62-month to evaluate her response to medications with a BMET in 2 weeks.   2.  Chronic diarrhea: Likely contributing to hypomagnesemia and hypokalemia along with prior use of HCTZ.  Hopefully starting spironolactone which is potassium sparing will help to equilibrate this.  She is to follow-up with GI as  directed.  3.  Chronic back pain: Followed by PCP and orthopedics.  Chronic pain may also be contributing to difficulty maintaining adequate blood pressure.  Will defer.      Signed, Phill Myron. West Pugh, ANP, AACC   02/22/2023 8:43 AM      Office [phone] Fax [phone]  Notice: This dictation was prepared with Dragon dictation along with smaller phrase technology. Any transcriptional errors that result from this process are unintentional and may not be corrected upon review.

## 2023-02-22 ENCOUNTER — Encounter: Payer: Self-pay | Admitting: Adult Health

## 2023-02-22 ENCOUNTER — Telehealth: Payer: Self-pay | Admitting: Adult Health

## 2023-02-22 ENCOUNTER — Ambulatory Visit: Payer: PPO | Attending: Adult Health | Admitting: Adult Health

## 2023-02-22 VITALS — BP 156/92 | HR 76 | Ht <= 58 in | Wt 159.6 lb

## 2023-02-22 DIAGNOSIS — K529 Noninfective gastroenteritis and colitis, unspecified: Secondary | ICD-10-CM

## 2023-02-22 DIAGNOSIS — M48062 Spinal stenosis, lumbar region with neurogenic claudication: Secondary | ICD-10-CM

## 2023-02-22 DIAGNOSIS — I10 Essential (primary) hypertension: Secondary | ICD-10-CM | POA: Diagnosis not present

## 2023-02-22 DIAGNOSIS — Z79899 Other long term (current) drug therapy: Secondary | ICD-10-CM | POA: Diagnosis not present

## 2023-02-22 MED ORDER — SPIRONOLACTONE 25 MG PO TABS: 12.5000 mg | ORAL_TABLET | Freq: Every day | ORAL | 0 refills | Status: DC

## 2023-02-22 NOTE — Telephone Encounter (Signed)
*  STAT* If patient is at the pharmacy, call can be transferred to refill team.   1. Which medications need to be refilled? (please list name of each medication and dose if known)   spironolactone (ALDACTONE) 25 MG tablet    2. Which pharmacy/location (including street and city if local pharmacy) is medication to be sent to?   [id number] [address], [place] Phone: [phone]  Fax: [phone]      3. Do they need a 30 day or 90 day supply? 30  (same amount sent first time)  Pt wants [facility] medication sent to [facility] instead of mail order

## 2023-02-22 NOTE — Patient Instructions (Signed)
Medication Instructions:  Start Spironolactone 12.5 MG daily  *If you need a refill on your cardiac medications before your next appointment, please call your pharmacy*   Lab Work: Your physician recommends that you return for lab work in: 2 weeks. BMET   If you have labs (blood work) drawn today and your tests are completely normal, you will receive your results only by: [place] (if you have MyChart) OR A paper copy in the mail If you have any lab test that is abnormal or we need to change your treatment, we will call you to review the results.   Testing/Procedures: None ordered   Follow-Up: At [facility], you and your health needs are our priority.  As part of our continuing mission to provide you with exceptional heart care, we have created designated Provider Care Teams.  These Care Teams include your primary Cardiologist (physician) and Advanced Practice Providers (APPs -  Physician Assistants and Nurse Practitioners) who all work together to provide you with the care you need, when you need it.  We recommend signing up for the patient portal called "MyChart".  Sign up information is provided on this After Visit Summary.  MyChart is used to connect with patients for Virtual Visits (Telemedicine).  Patients are able to view lab/test results, encounter notes, upcoming appointments, etc.  Non-urgent messages can be sent to your provider as well.   To learn more about what you can do with MyChart, go to [email or web address].    Your next appointment:   1 month(s)  Provider:   Jory Sims, DNP, ANP      Other Instructions

## 2023-02-22 NOTE — Telephone Encounter (Signed)
Pt's medication was sent to pt's pharmacy as requested. Confirmation received.  °

## 2023-02-26 NOTE — Telephone Encounter (Signed)
Spoke with patient who verbalizes understanding to proceed with GI appt as well as all other follow up, no need to proceed with nephrology referral at this time.

## 2023-02-26 NOTE — Telephone Encounter (Signed)
Called and spoke to patient about follow up care, patient has appointment w/ GI MD in may, agrees to cancel renal consult. Orders updated.

## 2023-02-28 ENCOUNTER — Telehealth: Payer: Self-pay | Admitting: Cardiology

## 2023-02-28 DIAGNOSIS — M25619 Stiffness of unspecified shoulder, not elsewhere classified: Secondary | ICD-10-CM | POA: Diagnosis not present

## 2023-02-28 DIAGNOSIS — I10 Essential (primary) hypertension: Secondary | ICD-10-CM

## 2023-02-28 DIAGNOSIS — E039 Hypothyroidism, unspecified: Secondary | ICD-10-CM | POA: Diagnosis not present

## 2023-02-28 DIAGNOSIS — R5382 Chronic fatigue, unspecified: Secondary | ICD-10-CM | POA: Diagnosis not present

## 2023-02-28 DIAGNOSIS — M4696 Unspecified inflammatory spondylopathy, lumbar region: Secondary | ICD-10-CM | POA: Diagnosis not present

## 2023-02-28 DIAGNOSIS — M79671 Pain in right foot: Secondary | ICD-10-CM | POA: Diagnosis not present

## 2023-02-28 DIAGNOSIS — E876 Hypokalemia: Secondary | ICD-10-CM | POA: Diagnosis not present

## 2023-02-28 NOTE — Telephone Encounter (Signed)
Pt c/o medication issue:  1. Name of Medication: spironolactone (ALDACTONE) 25 MG tablet  2. How are you currently taking this medication (dosage and times per day)? Take 0.5 tablets (12.5 mg total) by mouth daily.   3. Are you having a reaction (difficulty breathing--STAT)? No 4. What is your medication issue? Alena from [facility] would like to know if we can increase the quantity to 45 tablets to 90 days. This is the only way insurance will cover this medication for the pt.

## 2023-03-01 NOTE — Telephone Encounter (Signed)
Alena from [facility] needs a verbal ok from clinical over the phone. Please [phone]

## 2023-03-01 NOTE — Telephone Encounter (Signed)
Spoke with Alena from [employer], pt health insurance will cover Spironolactone if it's 90 day supply. Per Joni Reining, NP : She may have 90 day supply.  Alena from [employer] voiced understanding.

## 2023-03-04 ENCOUNTER — Ambulatory Visit: Payer: PPO | Admitting: Adult Health

## 2023-03-04 VITALS — BP 155/88 | HR 72 | Ht <= 58 in | Wt 160.4 lb

## 2023-03-04 DIAGNOSIS — M546 Pain in thoracic spine: Secondary | ICD-10-CM

## 2023-03-04 DIAGNOSIS — G609 Hereditary and idiopathic neuropathy, unspecified: Secondary | ICD-10-CM | POA: Diagnosis not present

## 2023-03-04 NOTE — Progress Notes (Signed)
PATIENT: Audrey [name] DOB: 13-Apr-1941  REASON FOR VISIT: follow up HISTORY FROM: patient  HISTORY OF PRESENT ILLNESS: Today 03/04/23: Audrey [name] is a 82 y.o. female with a history of peripheral neuropathy. Returns today for follow-up.  Patient states that her neuropathy is under relatively good control.  She does have pain across the center of the right foot and under the toes.  She also reports some joint pain in the right hand and shoulder.  Her primary care has referred her to rheumatology as her ANA and sedimentation rate was elevated.  . Balance is worse- has had 3 back surgeries last one in July 2023. Uses a cane. No recent falls. Still feels like she wants to lean forward.   Right thoracic pain- not stabbing pain but still uncomfortable. Doing dry needling and it seems to help. Has had ESI but it doesn't last- longest it last is 2.5 weeks. Trileptal made her Na low- had to go to the hospital. Na has slowly gotten back to normal.    04/12/21: Audrey [name] is a 82 year old female with a history of peripheral neuropathy.  She returns today for follow-up.  She reports that she has noticed more discomfort at nighttime.  She states that she does not have pain but describes it as itching.  In the feet.  She states that the cream helps but it takes a while before she notices the benefit.  Reports that she continues to have numbness in the feet.  States that her feet feel cold but are warm to the touch.  She states that she takes 1200 mg of gabapentin about 30 minutes before bedtime she tried to reduce to 900 mg but her discomfort increased.  She continues to take 300 mg in the morning denies any discomfort throughout the day just at bedtime.  Patient also reports that she has been having pain in the upper thighs in the groin region that radiates to the inner part of the thighs.  She states that she also has pain down the lateral aspect of both thighs.  The discomfort comes and goes.  She  followed up with Dr. Wynetta Emery and had an MRI of the back and hips.  I have not seen these images.  She reports that he told her there was no further work-up.  She also saw Dr. Lequita Halt and was told that it was spinal stenosis and advised that she should follow-up with her back surgeon.  I have not reviewed the notes from either doctors.  The patient is asking for my opinion.  10/11/20: Audrey [name] is a 82 year old female with a history of peripheral neuropathy.  She returns today for follow-up.  She states that there are some nights she is unable to sleep due to her neuropathy.  She states that most the time it feels as if her feet are itching.  This has always been how her neuropathy presented.  She does not have any significant pain associated with her neuropathy.  She reports that she adjusted her gabapentin and some nights will take 4 to 5 tablets at bedtime.  She reports that the increase in gabapentin was helpful.  She is currently taking gabapentin 300 mg in the morning and 1200 to 1500 mg at bedtime.  She denies any side effects from gabapentin.  Denies any changes with her gait or balance.  She does report back in March she tripped and fractured her neck and had to have surgery.  Since then she  is not had any additional falls.  She returns today for an evaluation.  HISTORY 07/09/19:   Audrey [name] is a 82 year old female with a history of peripheral neuropathy.  She returns today for follow-up.  She continues to take gabapentin 300 mg in the morning and at noon and 600 mg at bedtime.  She states that this continues to work well for her.  Over the last 6 months she has had 3 episodes where she will have a exacerbation of pain in her feet.  She describes it as a sharp shooting pain that can last for a while.  She states when this does happen she will take an extra gabapentin and it resolves the pain.  She denies any significant changes with her gait or balance.  She states that her balance is not as good as it  was because she is not able to go to yoga class to the pandemic.  She denies any new symptoms.  She returns today for an evaluation.  REVIEW OF SYSTEMS: Out of a complete 14 system review of symptoms, the patient complains only of the following symptoms, and all other reviewed systems are negative.  See HPI  ALLERGIES: Allergies  Allergen Reactions   Contrast Media [Iodinated Contrast Media] Shortness Of Breath   Gadobenate Anaphylaxis   Other Swelling    Cat gut sutures Cats and dogs                Derma bond- intolerance- itching- tolerates with Benadryl       Amlodipine Other (See Comments)    Made pt overly sleepy stated "she could not function"   Codeine Other (See Comments)    "Drives me crazy"   Cymbalta [Duloxetine Hcl] Diarrhea   Amoxicillin Nausea And Vomiting and Rash   Macrodantin [Nitrofurantoin] Rash    HOME MEDICATIONS: Outpatient Medications Prior to Visit  Medication Sig Dispense Refill   acetaminophen (TYLENOL) 500 MG tablet Take 1,000 mg by mouth every 6 (six) hours as needed for moderate pain.     Calcium-Vitamin D-Vitamin K (CALCIUM + D) [phone] MG-UNT-MCG CHEW Chew 1 tablet by mouth daily.     cholecalciferol (VITAMIN D) 25 MCG (1000 UNIT) tablet Take 1,000 Units by mouth daily.      CRANBERRY PO Take 2 tablets by mouth in the morning and at bedtime.     Cyanocobalamin (VITAMIN B-12) 5000 MCG SUBL Place 5,000 mcg under the tongue daily.      gabapentin (NEURONTIN) 300 MG capsule Take 1 capsule by mouth in the morning and take 4 capsules at bedtime. 450 capsule 2   Glucos-Chondroit-Hyaluron-MSM (GLUCOSAMINE CHONDROITIN JOINT PO) Take 1 tablet by mouth daily.     Lactobacillus-Inulin (CULTURELLE DIGESTIVE DAILY PO) Take 1 tablet by mouth daily.     levothyroxine (SYNTHROID, LEVOTHROID) 25 MCG tablet Take 25 mcg by mouth See admin instructions. Take 25 mcg by mouth on Monday, Wednesday, Fri,day and Saturday only.     loratadine (CLARITIN) 10 MG tablet Take  10 mg by mouth daily as needed for allergies.      Magnesium Oxide 400 MG CAPS Take 1 capsule (400 mg total) by mouth in the morning and at bedtime. 180 capsule 3   Menatetrenone (VITAMIN K2) 100 MCG TABS Take 100 mcg by mouth daily.     methocarbamol (ROBAXIN) 500 MG tablet Take 1 tablet (500 mg total) by mouth 4 (four) times daily. (Patient taking differently: Take 500 mg by mouth as needed for muscle spasms.) 45  tablet 0   Multiple Vitamins-Minerals (PRESERVISION AREDS 2) CAPS Take 1 tablet by mouth daily.     naproxen sodium (ALEVE) 220 MG tablet Take 220 mg by mouth daily as needed (pain).     nebivolol (BYSTOLIC) 5 MG tablet Take 1 tablet (5 mg total) by mouth in the morning and at bedtime. 180 tablet 3   Omega-3 Fatty Acids (FISH OIL PO) Take 1 capsule by mouth daily.     Polyethyl Glyc-Propyl Glyc PF (SYSTANE ULTRA PF) 0.4-0.3 % SOLN Place 1 drop into both eyes in the morning.     potassium chloride (KLOR-CON) 10 MEQ tablet Take 1 tablet (10 mEq total) by mouth daily. Take 2 tabs for one week then change to once daily (Patient taking differently: Take 10 mEq by mouth 2 (two) times daily.) 60 tablet 2   pyridOXINE (VITAMIN B-6) 100 MG tablet Take 100 mg by mouth daily.     spironolactone (ALDACTONE) 25 MG tablet Take 0.5 tablets (12.5 mg total) by mouth daily. 15 tablet 0   vitamin C (ASCORBIC ACID) 500 MG tablet Take 500 mg by mouth daily.     No facility-administered medications prior to visit.    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   Hypertension    Hypothyroidism    Obesity    PONV (postoperative nausea and vomiting)    Reflux    buring and dysphagia sx resp to PPI; egd 3/06 neg for Barretts   Tachycardia     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   Yeast infection    hx of     PAST SURGICAL HISTORY: Past Surgical History:  Procedure Laterality Date   AXILLARY LYMPH NODE BIOPSY Left 12/27/2015   Procedure: LEFT AXILLARY SENTINEL LYMPH NODE BIOPSY;  Surgeon: Ovidio Kin, MD;  Location: [facility];  Service: General;  Laterality: Left;   BELPHAROPTOSIS REPAIR Bilateral    BREAST LUMPECTOMY WITH RADIOACTIVE SEED LOCALIZATION Left 12/01/2015   Procedure: LEFT BREAST BIOPSYWITH RADIOACTIVE SEED LOCALIZATION;  Surgeon: Ovidio Kin, MD;  Location: [facility] OR;  Service: General;  Laterality: Left;   BREAST SURGERY     CATARACT EXTRACTION, BILATERAL Bilateral 08/2018   COLONOSCOPY  01/2005   NEG   cortisone injections     right arm; back   EYE SURGERY     cosmetic; eyelid surgery bilat    FINGER SURGERY Left    left index finger/ cyst 02/2007   HARDWARE REMOVAL N/A 06/13/2022   Procedure: Bilateral Thoracic Ten- Removal of Screws;  Surgeon: Donalee Citrin, MD;  Location: [facility] OR;  Service: Neurosurgery;  Laterality: N/A;   KNEE ARTHROSCOPY Right 04/13/2015   Procedure: ARTHROSCOPY RIGHT KNEE WITH MEDIAL MENISCAL DEBRIDEMENT AND CHONDROPLASTY;  Surgeon: Ollen Gross, MD;  Location: [facility];  Service: Orthopedics;  Laterality: Right;   KYPHOPLASTY N/A 03/16/2020   Procedure: KYPHOPLASTY THORACIC THREE, THORACIC  FOUR, THORACIC SIX;  Surgeon: Shirlean Kelly, MD;  Location: [facility];  Service: Neurosurgery;  Laterality: N/A;   KYPHOPLASTY N/A 06/13/2022   Procedure: Vertobroplasty Thoracic Ten;  Surgeon: Donalee Citrin, MD;  Location[facility];  Service: Neurosurgery;  Laterality: N/A;   LAMINECTOMY WITH POSTERIOR LATERAL ARTHRODESIS LEVEL 4 N/A 12/20/2021   Procedure:  Thoracic Ten-Eleven, Thoracic Eleven-Twelve, Thoracic Twelve-Lumbar One, Lumbar One-Two Posterior Lateral Fusion;  Surgeon: Donalee Citrin, MD;  Location: [facility] OR;  Service: Neurosurgery;  Laterality: N/A;   left rotator cuff repair Left    12/2006   LUMBAR FUSION     x3   LUMBAR LAMINECTOMY     removal of screws  08/2022   TONSILLECTOMY      FAMILY HISTORY: Family History  Problem Relation Age of Onset   Diabetes Mother    Breast cancer Mother        dx. late 76s-early 45s; "metastasis to throat"?   Hodgkin's lymphoma Father 67   Breast cancer Sister        maternal half-sister dx. w/ DCIS in her late 94s;   Other Sister        learning disabilities; lives on her own   Diabetes Sister    Alcohol abuse Brother    Breast cancer Maternal Aunt        dx. 50s   Ovarian cancer Maternal Aunt 59   Breast cancer Paternal Aunt        dx. early 9s   Heart Problems Paternal Aunt    Stroke Maternal Grandfather    Depression Paternal Grandmother    Heart Problems Paternal Grandmother    Skin cancer Daughter        dx. early 24s   Other Daughter        insterstitial cystitis   Neuropathy Neg Hx     SOCIAL HISTORY: Social History   Socioeconomic History   Marital status: Married    Spouse name: Joe   Number of children: 2   Years of education: 13   Highest education level: Not on file  Occupational History   Occupation: retired  Tobacco Use   Smoking status: Never   Smokeless tobacco: Never  Vaping Use   Vaping Use: Never used  Substance and Sexual Activity   Alcohol use: No   Drug use: Not Currently    Comment: CBD- last time -10/2021   Sexual activity: Yes  Other Topics Concern   Not on file  Social History Narrative   Patient is right handed.   Patient drinks 2-3 cups of caffeine per day.   Social Determinants of Health   Financial Resource Strain: Not on file  Food Insecurity: Not on file  Transportation Needs: Not on file  Physical Activity: Not on file   Stress: Not on file  Social Connections: Not on file  Intimate Partner Violence: Not on file      PHYSICAL EXAM  Vitals:   03/04/23 1141  BP: (!) 155/88  Pulse: 72  Weight: 160 lb 6.4 oz (72.8 kg)  Height: 4\' 10"  (1.473 m)    Body mass index is 33.52 kg/m.  Generalized: Well developed, in no acute distress   Neurological examination  Mentation: Alert oriented to time, place, history taking. Follows all commands speech and language fluent Cranial nerve II-XII: Pupils were equal round reactive to light. Extraocular movements were full, visual field were full on confrontational test. Facial sensation and strength were normal. Head turning and shoulder shrug  were normal  and symmetric. Motor: The motor testing reveals 5 over 5 strength of all 4 extremities. Good symmetric motor tone is noted throughout.  Sensory: Sensory testing is intact to soft touch on all 4 extremities.  Coordination: Cerebellar testing reveals good finger-nose-finger and heel-to-shin bilaterally.  Gait and station: uses Reader when ambulating.  Reflexes: Deep tendon reflexes are symmetric and normal bilaterally.   DIAGNOSTIC DATA (LABS, IMAGING, TESTING) - I reviewed patient records, labs, notes, testing and imaging myself where available.  Lab Results  Component Value Date   WBC 7.9 02/18/2023   HGB 14.2 02/18/2023   HCT 41.1 02/18/2023   MCV 87.3 02/18/2023   PLT 383 02/18/2023      Component Value Date/Time   NA 139 02/18/2023 1120   NA 141 02/12/2023 0000   NA 141 11/30/2016 1358   K 3.3 (L) 02/18/2023 1120   K 3.5 11/30/2016 1358   CL 102 02/18/2023 1120   CO2 25 02/18/2023 1120   CO2 29 11/30/2016 1358   GLUCOSE 98 02/18/2023 1120   GLUCOSE 103 11/30/2016 1358   BUN 11 02/18/2023 1120   BUN 9 02/12/2023 0000   BUN 14.2 11/30/2016 1358   CREATININE 0.77 02/18/2023 1120   CREATININE 0.84 10/11/2022 1357   CREATININE 0.8 11/30/2016 1358   CALCIUM 9.3 02/18/2023 1120   CALCIUM 9.5  11/30/2016 1358   PROT 7.0 02/18/2023 1120   PROT 7.1 11/30/2016 1358   ALBUMIN 3.9 02/18/2023 1120   ALBUMIN 4.0 11/30/2016 1358   AST 24 02/18/2023 1120   AST 20 10/11/2022 1357   AST 25 11/30/2016 1358   ALT 15 02/18/2023 1120   ALT 15 10/11/2022 1357   ALT 20 11/30/2016 1358   ALKPHOS 65 02/18/2023 1120   ALKPHOS 71 11/30/2016 1358   BILITOT 0.9 02/18/2023 1120   BILITOT 0.7 10/11/2022 1357   BILITOT 0.55 11/30/2016 1358   GFRNONAA >60 02/18/2023 1120   GFRNONAA >60 10/11/2022 1357   GFRAA >60 03/16/2020 0717      ASSESSMENT AND PLAN 82 y.o. year old female  has a past medical history of Allergy, Arthritis, Bladder infection, Breast cancer (HCC) (10/25/2015), Cancer (HCC), Complication of anesthesia, Diastolic dysfunction, Dyspnea, Fibromyalgia, GERD (gastroesophageal reflux disease), Hereditary and idiopathic peripheral neuropathy (02/10/2015), Hypertension, Hypothyroidism, Obesity, PONV (postoperative nausea and vomiting), Reflux, Tachycardia ( ), and Yeast infection. here with:  1.  Peripheral neuropathy 2. Right thoracic pain   -Continue gabapentin 300 mg in the morning and noon  and 1200 mg at bedtime.  -Dicussed potentially trying another medication after see follows up with Dr. Wynetta Emery -Advised if symptoms worsen or she develops new symptoms she should let us know -Follow-up in 1 year or sooner if needed   Butch Penny, MSN, NP-C 03/04/2023, 11:33 AM [facility] [address], [address] [place], [place] [address] [phone]

## 2023-03-05 MED ORDER — SPIRONOLACTONE 25 MG PO TABS: 12.5000 mg | ORAL_TABLET | Freq: Every day | ORAL | 0 refills | Status: DC

## 2023-03-08 DIAGNOSIS — Z79899 Other long term (current) drug therapy: Secondary | ICD-10-CM | POA: Diagnosis not present

## 2023-03-08 DIAGNOSIS — I10 Essential (primary) hypertension: Secondary | ICD-10-CM | POA: Diagnosis not present

## 2023-03-09 LAB — BASIC METABOLIC PANEL
BUN/Creatinine Ratio: 13 (ref 12–28)
BUN: 11 mg/dL (ref 8–27)
CO2: 23 mmol/L (ref 20–29)
Calcium: 10 mg/dL (ref 8.7–10.3)
Chloride: 100 mmol/L (ref 96–106)
Creatinine, Ser: 0.88 mg/dL (ref 0.57–1.00)
Glucose: 97 mg/dL (ref 70–99)
Potassium: 4.4 mmol/L (ref 3.5–5.2)
Sodium: 140 mmol/L (ref 134–144)
eGFR: 67 mL/min/{1.73_m2} (ref >59)

## 2023-03-11 ENCOUNTER — Telehealth: Payer: Self-pay

## 2023-03-11 ENCOUNTER — Telehealth: Payer: Self-pay | Admitting: Adult Health

## 2023-03-11 MED ORDER — SPIRONOLACTONE 25 MG PO TABS: 12.5000 mg | ORAL_TABLET | Freq: Every day | ORAL | 3 refills | Status: DC

## 2023-03-11 NOTE — Telephone Encounter (Addendum)
Called patient regarding results. Patient had understandingof reuslts.----- Message from [name], NP sent at 03/11/2023  1:54 PM EDT ----- I have reviewed her labs. I started her on spironolactone because she had low potassium. The spironolactone helps to keep potassium levels normal. This lab has shown improved potassium levels to normal. Continue the spironolactone 12.5 mg daily as directed.    KL

## 2023-03-12 DIAGNOSIS — M79671 Pain in right foot: Secondary | ICD-10-CM | POA: Diagnosis not present

## 2023-03-12 DIAGNOSIS — M1990 Unspecified osteoarthritis, unspecified site: Secondary | ICD-10-CM | POA: Diagnosis not present

## 2023-03-12 DIAGNOSIS — R7982 Elevated C-reactive protein (CRP): Secondary | ICD-10-CM | POA: Diagnosis not present

## 2023-03-12 DIAGNOSIS — M549 Dorsalgia, unspecified: Secondary | ICD-10-CM | POA: Diagnosis not present

## 2023-03-12 DIAGNOSIS — M0579 Rheumatoid arthritis with rheumatoid factor of multiple sites without organ or systems involvement: Secondary | ICD-10-CM | POA: Diagnosis not present

## 2023-03-12 DIAGNOSIS — M79641 Pain in right hand: Secondary | ICD-10-CM | POA: Diagnosis not present

## 2023-03-12 DIAGNOSIS — Z79899 Other long term (current) drug therapy: Secondary | ICD-10-CM | POA: Diagnosis not present

## 2023-03-12 DIAGNOSIS — M25561 Pain in right knee: Secondary | ICD-10-CM | POA: Diagnosis not present

## 2023-03-12 DIAGNOSIS — M79642 Pain in left hand: Secondary | ICD-10-CM | POA: Diagnosis not present

## 2023-03-12 DIAGNOSIS — M2550 Pain in unspecified joint: Secondary | ICD-10-CM | POA: Diagnosis not present

## 2023-03-12 DIAGNOSIS — M797 Fibromyalgia: Secondary | ICD-10-CM | POA: Diagnosis not present

## 2023-03-12 DIAGNOSIS — M25562 Pain in left knee: Secondary | ICD-10-CM | POA: Diagnosis not present

## 2023-03-12 DIAGNOSIS — M79672 Pain in left foot: Secondary | ICD-10-CM | POA: Diagnosis not present

## 2023-03-13 ENCOUNTER — Telehealth: Payer: Self-pay

## 2023-03-13 NOTE — Telephone Encounter (Addendum)
Returned call to patient regarding mediation management . Advised patient per K. Lawrence patient  can decide who will manage cardiac medication patient decided will have K. Lawrence manage medication.----- Message from [name], NP sent at 03/12/2023  8:46 AM EDT ----- Regarding: RE: I think only one person should do it. If she sees her PCP more maybe he should manage it. I am happy to manage it. It would be up to her. She should make the choice. She will need to have follow up labs to monitor this.  Samara Deist ----- Message ----- From: Donneta Romberg, CMA Sent: 03/11/2023   3:28 PM EDT To: [name], NP  Annabelle Harman;  Mrs. Hubley went to see her PCP and her PCP changed the dose on her Spironolactone to a full tablet from half a tablet . She wanted to know if she should have her PCP manage or should the cardiologist manage.  Thanks

## 2023-03-14 DIAGNOSIS — Z1231 Encounter for screening mammogram for malignant neoplasm of breast: Secondary | ICD-10-CM | POA: Diagnosis not present

## 2023-03-14 DIAGNOSIS — Z853 Personal history of malignant neoplasm of breast: Secondary | ICD-10-CM | POA: Diagnosis not present

## 2023-03-19 NOTE — Progress Notes (Signed)
Cardiology Clinic Note   Patient Name: Audrey [name] Date of Encounter: 03/22/2023  Primary Care Provider:  Ollen Bowl, MD Primary Cardiologist:  Armanda Magic, MD  Patient Profile    82 year old female with history of sinus tachycardia, hypertension, unable to tolerate BB due to fatigue and sleepiness, hypothyroidism,.    Most recent echocardiogram 04/16/2012 EF of normal with Grade I diastolic dysfunction. Seen in ED on 02/18/2023 for hypertension, BP at 161/100, after sustaining mechanical fall in her bathroom, hitting her head on the mirror. CT was negative for acute intracranial abnormality.She has chronic rib pain from the fall. She was started on spironolactone 12.5 mg daily with follow up with PCP who increased her dose to 25 mg daily. Repeat labs on 03/08/2023 revealed K+ of 4.4, Creatinine 0.88.  Due to frequent diarrhea she was to follow up with GI.   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   Hypertension    Hypothyroidism    Obesity    PONV (postoperative nausea and vomiting)    Reflux    buring and dysphagia sx resp to PPI; egd 3/06 neg for Barretts   Tachycardia     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   Yeast infection    hx of    Past Surgical History:  Procedure Laterality Date   AXILLARY LYMPH NODE BIOPSY Left 12/27/2015   Procedure: LEFT AXILLARY SENTINEL LYMPH NODE BIOPSY;  Surgeon: Ovidio Kin, MD;  Location: [facility];  Service: General;   Laterality: Left;   BELPHAROPTOSIS REPAIR Bilateral    BREAST LUMPECTOMY WITH RADIOACTIVE SEED LOCALIZATION Left 12/01/2015   Procedure: LEFT BREAST BIOPSYWITH RADIOACTIVE SEED LOCALIZATION;  Surgeon: Ovidio Kin, MD;  Location: [facility] OR;  Service: General;  Laterality: Left;   BREAST SURGERY     CATARACT EXTRACTION, BILATERAL Bilateral 08/2018   COLONOSCOPY  01/2005   NEG   cortisone injections     right arm; back   EYE SURGERY     cosmetic; eyelid surgery bilat    FINGER SURGERY Left    left index finger/ cyst 02/2007   HARDWARE REMOVAL N/A 06/13/2022   Procedure: Bilateral Thoracic Ten- Removal of Screws;  Surgeon: Donalee Citrin, MD;  Location: [facility] OR;  Service: Neurosurgery;  Laterality: N/A;   KNEE ARTHROSCOPY Right 04/13/2015   Procedure: ARTHROSCOPY RIGHT KNEE WITH MEDIAL MENISCAL DEBRIDEMENT AND CHONDROPLASTY;  Surgeon: Ollen Gross, MD;  Location: [facility];  Service: Orthopedics;  Laterality: Right;   KYPHOPLASTY N/A 03/16/2020   Procedure: KYPHOPLASTY THORACIC THREE, THORACIC FOUR, THORACIC SIX;  Surgeon: Shirlean Kelly, MD;  Location: [facility] OR;  Service: Neurosurgery;  Laterality: N/A;   KYPHOPLASTY N/A 06/13/2022   Procedure: Vertobroplasty Thoracic Ten;  Surgeon: Donalee Citrin, MD;  Location: [facility] OR;  Service: Neurosurgery;  Laterality: N/A;   LAMINECTOMY WITH POSTERIOR LATERAL ARTHRODESIS LEVEL 4 N/A 12/20/2021  Procedure: Thoracic Ten-Eleven, Thoracic Eleven-Twelve, Thoracic Twelve-Lumbar One, Lumbar One-Two Posterior Lateral Fusion;  Surgeon: Donalee Citrin, MD;  Location: [facility] OR;  Service: Neurosurgery;  Laterality: N/A;   left rotator cuff repair Left    12/2006   LUMBAR FUSION     x3   LUMBAR LAMINECTOMY     removal of screws  08/2022   TONSILLECTOMY      Allergies  Allergies  Allergen Reactions   Contrast Media [Iodinated Contrast Media] Shortness Of Breath   Gadobenate Anaphylaxis   Other Swelling    Cat gut sutures Cats and dogs                Derma bond- intolerance-  itching- tolerates with Benadryl       Amlodipine Other (See Comments)    Made pt overly sleepy stated "she could not function"   Codeine Other (See Comments)    "Drives me crazy"   Cymbalta [Duloxetine Hcl] Diarrhea   Trileptal [Oxcarbazepine]     Low Sodium    Amoxicillin Nausea And Vomiting and Rash   Macrodantin [Nitrofurantoin] Rash    History of Present Illness    Mrs. Laramee comes today with main complaints of neurologic pain on the right flank area.  She states that this can sometimes be debilitating.  She is being followed by neurology and pain management.  She believes a lot of the pain is causing elevations in her blood pressure.  She did begin to take spironolactone 25 mg daily as prescribed by PCP which was increased dose from the 12.5 mg daily.  She states that she did not feel well on it was more lightheaded.  However blood pressures remain slightly elevated.  She is otherwise doing well.  Home Medications    Current Outpatient Medications  Medication Sig Dispense Refill   acetaminophen (TYLENOL) 500 MG tablet Take 1,000 mg by mouth every 6 (six) hours as needed for moderate pain.     b complex vitamins capsule Take 1 capsule by mouth daily.     CRANBERRY PO Take 3 tablets by mouth in the morning and at bedtime.     famotidine (PEPCID) 20 MG tablet Take 20 mg by mouth daily.     gabapentin (NEURONTIN) 300 MG capsule Take 1 capsule by mouth in the morning and take 4 capsules at bedtime. (Patient taking differently: Take 1 capsule by mouth in the morning, lunch and take 4 capsules at bedtime.) 450 capsule 2   Glucosamine-Chondroit-Vit C-Mn (GLUCOSAMINE CHONDR 1500 COMPLX) CAPS Take 1 capsule by mouth daily. 1500/1200 tablet daily     levothyroxine (SYNTHROID) 25 MCG tablet Take 25 mcg by mouth. Take 1 tablet daily, on MWF and saturday     loratadine (CLARITIN) 10 MG tablet Take 10 mg by mouth daily as needed for allergies.      methocarbamol (ROBAXIN) 500 MG tablet Take 1  tablet (500 mg total) by mouth 4 (four) times daily. (Patient taking differently: Take 500 mg by mouth as needed for muscle spasms.) 45 tablet 0   Multiple Vitamins-Minerals (CENTRUM SILVER 50+WOMEN PO) Take 1 tablet by mouth daily.     naproxen sodium (ALEVE) 220 MG tablet Take 440 mg by mouth daily as needed (pain).     nebivolol (BYSTOLIC) 5 MG tablet Take 1 tablet (5 mg total) by mouth in the morning and at bedtime. 180 tablet 3   Polyethyl Glyc-Propyl Glyc PF (SYSTANE ULTRA PF) 0.4-0.3 % SOLN Place 1 drop into both eyes in the  morning.     predniSONE (DELTASONE) 10 MG tablet 2 tablets once a day for 14 days, 1 tablet once a day for 14 days Orally Once a day for 28 days     spironolactone (ALDACTONE) 25 MG tablet Take 0.5 tablets (12.5 mg total) by mouth daily. (Patient taking differently: Take 12.5 mg by mouth daily. Can take Additional 1/2 Tablet for Elevated Blood Pressure.) 90 tablet 3   No current facility-administered medications for this visit.     Family History    Family History  Problem Relation Age of Onset   Diabetes Mother    Breast cancer Mother        dx. late 25s-early 56s; "metastasis to throat"?   Hodgkin's lymphoma Father 46   Breast cancer Sister        maternal half-sister dx. w/ DCIS in her late 65s;   Other Sister        learning disabilities; lives on her own   Diabetes Sister    Alcohol abuse Brother    Breast cancer Maternal Aunt        dx. 50s   Ovarian cancer Maternal Aunt 59   Breast cancer Paternal Aunt        dx. early 40s   Heart Problems Paternal Aunt    Stroke Maternal Grandfather    Depression Paternal Grandmother    Heart Problems Paternal Grandmother    Skin cancer Daughter        dx. early 50s   Other Daughter        insterstitial cystitis   Neuropathy Neg Hx    She indicated that her mother is deceased. She indicated that her father is deceased. She indicated that her sister is alive. She indicated that her brother is deceased. She  indicated that her maternal grandmother is deceased. She indicated that her maternal grandfather is deceased. She indicated that her paternal grandmother is deceased. She indicated that her paternal grandfather is deceased. She indicated that her daughter is alive. She indicated that her son is alive. She indicated that all of her five maternal aunts are deceased. She indicated that her maternal uncle is deceased. She indicated that both of her paternal aunts are deceased. She indicated that her paternal uncle is deceased. She indicated that the status of her neg hx is unknown.  Social History    Social History   Socioeconomic History   Marital status: Married    Spouse name: Joe   Number of children: 2   Years of education: 13   Highest education level: Not on file  Occupational History   Occupation: retired  Tobacco Use   Smoking status: Never   Smokeless tobacco: Never  Vaping Use   Vaping Use: Never used  Substance and Sexual Activity   Alcohol use: No   Drug use: Not Currently    Comment: CBD- last time -10/2021   Sexual activity: Yes  Other Topics Concern   Not on file  Social History Narrative   Patient is right handed.   Patient drinks 2-3 cups of caffeine per day.   Social Determinants of Health   Financial Resource Strain: Not on file  Food Insecurity: Not on file  Transportation Needs: Not on file  Physical Activity: Not on file  Stress: Not on file  Social Connections: Not on file  Intimate Partner Violence: Not on file     Review of Systems    General:  No chills, fever, night sweats or weight changes.  Cardiovascular:  No chest pain, dyspnea on exertion, edema, orthopnea, palpitations, paroxysmal nocturnal dyspnea. Dermatological: No rash, lesions/masses Respiratory: No cough, dyspnea Urologic: No hematuria, dysuria Abdominal:   No nausea, vomiting, diarrhea, bright red blood per rectum, melena, or hematemesis Neurologic:  No visual changes, wkns,  changes in mental status.  Chronic pain right flank from back in dermatome area. All other systems reviewed and are otherwise negative except as noted above.     Physical Exam    VS:  BP 136/82   Pulse 67   Ht 4\' 10"  (1.473 m)   Wt 160 lb 3.2 oz (72.7 kg)   SpO2 99%   BMI 33.48 kg/m  , BMI Body mass index is 33.48 kg/m.     GEN: Well nourished, well developed, in no acute distress. HEENT: normal. Neck: Supple, no JVD, carotid bruits, or masses. Cardiac: RRR, no murmurs, rubs, or gallops. No clubbing, cyanosis, edema.  Radials/DP/PT 2+ and equal bilaterally.  Respiratory:  Respirations regular and unlabored, clear to auscultation bilaterally. GI: Soft, nontender, nondistended, BS + x 4. MS: no deformity or atrophy.  Kyphosis is noted Skin: warm and dry, no rash. Neuro:  Strength and sensation are intact. Psych: Normal affect.  Accessory Clinical Findings    ECG personally reviewed by me today-not completed this office visit  Lab Results  Component Value Date   WBC 7.9 02/18/2023   HGB 14.2 02/18/2023   HCT 41.1 02/18/2023   MCV 87.3 02/18/2023   PLT 383 02/18/2023   Lab Results  Component Value Date   CREATININE 0.88 03/08/2023   BUN 11 03/08/2023   NA 140 03/08/2023   K 4.4 03/08/2023   CL 100 03/08/2023   CO2 23 03/08/2023   Lab Results  Component Value Date   ALT 15 02/18/2023   AST 24 02/18/2023   ALKPHOS 65 02/18/2023   BILITOT 0.9 02/18/2023   No results found for: "CHOL", "HDL", "LDLCALC", "LDLDIRECT", "TRIG", "CHOLHDL"  No results found for: "HGBA1C"  Review of Prior Studies: See scanned document  Assessment & Plan   1. Hypertension: Blood pressure has been elevated lately, she believes is related to her pain which is not well-managed.  She has been placed on prednisone which has made a difference concerning her pain management and I have seen her trends and they are showing reduced blood pressure from 179/88 down to the 136/80 range.  However in  the evenings it is elevated at times.  I will keep her on spironolactone 12.5 mg daily, and she may take an additional 12.5 mg in the evening if blood pressure is greater then 160/90  2.  Hypokalemia: Potassium has normalized on spironolactone 12.5 mg daily.  Continue current medication regimen.  Follow-up labs every 6 months.  3.  Chronic diarrhea: Being followed by GI with appointment scheduled May 2024.  She is stopping gluten and dairy products.  4.  Chronic neuropathic/pain: Has been occurring since having lumbar back surgery.  Is to follow-up with pain management through neurology.         Signed, Bettey Mare. Liborio Nixon, ANP, AACC   03/22/2023 12:15 PM      Office [phone] Fax [phone]  Notice: This dictation was prepared with Dragon dictation along with smaller phrase technology. Any transcriptional errors that result from this process are unintentional and may not be corrected upon review.

## 2023-03-22 ENCOUNTER — Encounter: Payer: Self-pay | Admitting: Adult Health

## 2023-03-22 ENCOUNTER — Ambulatory Visit: Payer: PPO | Attending: Adult Health | Admitting: Adult Health

## 2023-03-22 VITALS — BP 136/82 | HR 67 | Ht <= 58 in | Wt 160.2 lb

## 2023-03-22 DIAGNOSIS — M792 Neuralgia and neuritis, unspecified: Secondary | ICD-10-CM | POA: Diagnosis not present

## 2023-03-22 DIAGNOSIS — I10 Essential (primary) hypertension: Secondary | ICD-10-CM

## 2023-03-22 NOTE — Patient Instructions (Signed)
Medication Instructions:  No Changes *If you need a refill on your cardiac medications before your next appointment, please call your pharmacy*   Lab Work: BMET If you have labs (blood work) drawn today and your tests are completely normal, you will receive your results only by: MyChart Message (if you have MyChart) OR A paper copy in the mail If you have any lab test that is abnormal or we need to change your treatment, we will call you to review the results.   Testing/Procedures: No Testing   Follow-Up: At [facility], you and your health needs are our priority.  As part of our continuing mission to provide you with exceptional heart care, we have created designated Provider Care Teams.  These Care Teams include your primary Cardiologist (physician) and Advanced Practice Providers (APPs -  Physician Assistants and Nurse Practitioners) who all work together to provide you with the care you need, when you need it.  We recommend signing up for the patient portal called "MyChart".  Sign up information is provided on this After Visit Summary.  MyChart is used to connect with patients for Virtual Visits (Telemedicine).  Patients are able to view lab/test results, encounter notes, upcoming appointments, etc.  Non-urgent messages can be sent to your provider as well.   To learn more about what you can do with MyChart, go to [email or web address].    Your next appointment:   3 month(s)  Provider:   Armanda Magic, MD

## 2023-03-28 ENCOUNTER — Telehealth: Payer: Self-pay | Admitting: Adult Health

## 2023-03-28 NOTE — Telephone Encounter (Signed)
Pt called stating that the provider requested her to call her. She states that this was mentioned in her last appt and does not remember what needed to be discussed.

## 2023-03-28 NOTE — Telephone Encounter (Signed)
I called pt back to see what her issue was, stating that we the nurses see if we can assist first then if needed speak to provider.  She last saw MM/NP and her concern for her husband and needing to be seen asap.  She did not really want to tell me what it was but after asking questions she said that her husband had knee replacement and now had MRI showing 2 mini strokes.  Needing to be seen.  I relayed that I was happy to send message to MM/NP but if referral is in the que then referrals would be working on (could be seen several months out but referrals are triaged) so cancellations are possible and possible could be seen sooner.

## 2023-03-28 NOTE — Telephone Encounter (Signed)
Perhaps this was to touch base after seeing Dr Wynetta Emery? Plan from last note is below:

## 2023-03-28 NOTE — Telephone Encounter (Signed)
Noted  

## 2023-04-02 ENCOUNTER — Telehealth: Payer: Self-pay | Admitting: Cardiology

## 2023-04-02 NOTE — Telephone Encounter (Signed)
Pt stated [facility] contacted her regarding a referral the received from MD and she's requesting a callback to see if she still needs to schedule to be seen at their office or not. Please advise

## 2023-04-02 NOTE — Telephone Encounter (Signed)
Returned pt call about referral. Pt stated she believed referral had been canceled after she went to the ED and had lab work there.  Confirmed to her that our referral was canceled after her ED visit per Dr. Mayford Knife. Instructed pt to possibly check with PCP for referral, as we can't see [facility] referrals, in case. Pt stated understanding and was grateful for the help.

## 2023-04-03 DIAGNOSIS — G894 Chronic pain syndrome: Secondary | ICD-10-CM | POA: Diagnosis not present

## 2023-04-03 DIAGNOSIS — Z9889 Other specified postprocedural states: Secondary | ICD-10-CM | POA: Diagnosis not present

## 2023-04-03 DIAGNOSIS — G588 Other specified mononeuropathies: Secondary | ICD-10-CM | POA: Diagnosis not present

## 2023-04-23 DIAGNOSIS — R1011 Right upper quadrant pain: Secondary | ICD-10-CM | POA: Diagnosis not present

## 2023-04-23 DIAGNOSIS — K529 Noninfective gastroenteritis and colitis, unspecified: Secondary | ICD-10-CM | POA: Diagnosis not present

## 2023-04-24 ENCOUNTER — Other Ambulatory Visit: Payer: Self-pay | Admitting: Internal Medicine

## 2023-04-24 DIAGNOSIS — R1011 Right upper quadrant pain: Secondary | ICD-10-CM

## 2023-04-29 DIAGNOSIS — H532 Diplopia: Secondary | ICD-10-CM | POA: Diagnosis not present

## 2023-04-30 DIAGNOSIS — M1990 Unspecified osteoarthritis, unspecified site: Secondary | ICD-10-CM | POA: Diagnosis not present

## 2023-04-30 DIAGNOSIS — M797 Fibromyalgia: Secondary | ICD-10-CM | POA: Diagnosis not present

## 2023-04-30 DIAGNOSIS — Z79899 Other long term (current) drug therapy: Secondary | ICD-10-CM | POA: Diagnosis not present

## 2023-04-30 DIAGNOSIS — R7982 Elevated C-reactive protein (CRP): Secondary | ICD-10-CM | POA: Diagnosis not present

## 2023-04-30 DIAGNOSIS — M0579 Rheumatoid arthritis with rheumatoid factor of multiple sites without organ or systems involvement: Secondary | ICD-10-CM | POA: Diagnosis not present

## 2023-04-30 DIAGNOSIS — N289 Disorder of kidney and ureter, unspecified: Secondary | ICD-10-CM | POA: Diagnosis not present

## 2023-05-02 ENCOUNTER — Telehealth: Payer: Self-pay | Admitting: Neurology

## 2023-05-02 ENCOUNTER — Encounter: Payer: Self-pay | Admitting: Neurology

## 2023-05-02 ENCOUNTER — Encounter: Payer: Self-pay | Admitting: Nurse Practitioner

## 2023-05-02 ENCOUNTER — Ambulatory Visit: Payer: PPO | Admitting: Neurology

## 2023-05-02 VITALS — BP 161/87 | HR 69 | Ht <= 58 in | Wt 167.0 lb

## 2023-05-02 DIAGNOSIS — H532 Diplopia: Secondary | ICD-10-CM | POA: Diagnosis not present

## 2023-05-02 NOTE — Telephone Encounter (Signed)
[employer] NPR sent to GI [phone] 

## 2023-05-02 NOTE — Patient Instructions (Signed)
MRI brain without contrast, I will contact you to go over the results Continue to wear your Fresnel prism eyeglasses Follow-up with ophthalmologist as scheduled

## 2023-05-02 NOTE — Progress Notes (Signed)
[facility]  PATIENT: Audrey [name] DOB: [birth year]  REQUESTING CLINICIAN: Manning Charity, OD HISTORY FROM: Patient  REASON FOR VISIT: Double vision    HISTORICAL  CHIEF COMPLAINT:  Chief Complaint  Patient presents with   Diplopia    Rm12, alone onset diplopia BLURRY DOUBLE VISION VISION TEST: OD 20/50, OS 20/40, OU 20/50     HISTORY OF PRESENT ILLNESS:  This is a 82 year old woman past medical history of hypothyroidism, hypertension, heart disease, peripheral neuropathy who is presenting with double vision.  Patient reports episode of double vision that occurred suddenly on Tuesday, May 28.  She did not think anything of it then she had another episode Friday, May 31.  She described episode as seeing 2 images side-by-side.  When she closes one eye, her vision will be back to normal.  Over the weekend it got worse and on Monday she went to her ophthalmologist.  She was given a Fresnel prism eyeglasses on the right which corrected her vision.  Patient reports when she removes her glasses she might see double but once looking with her glasses her vision is back to normal.  She denies any headaches, denies any pain with chewing, no pain with brushing her hair, denies any focal neurological deficit and no other complaints.    OTHER MEDICAL CONDITIONS: Hypothyroidism, Hypertension, Heart disease, peripheral neuropathy    REVIEW OF SYSTEMS: Full 14 system review of systems performed and negative with exception of: As noted in the HPI   ALLERGIES: Allergies  Allergen Reactions   Gadobenate Anaphylaxis and Shortness Of Breath   Iodinated Contrast Media Shortness Of Breath    Other Reaction(s): SOB and Nausea   Other Swelling    Cat gut sutures Cats and dogs                Derma bond- intolerance- itching- tolerates with Benadryl       Amlodipine Other (See Comments)    Made pt overly sleepy stated "she could not function"   Codeine Other (See Comments)     "Drives me crazy"   Cymbalta [Duloxetine Hcl] Diarrhea   Rosuvastatin     Other Reaction(s): myalgia   Trileptal [Oxcarbazepine]     Low Sodium    Amoxicillin Nausea And Vomiting and Rash    Other Reaction(s): rash, vomiting   Nitrofurantoin Rash and Itching    Other Reaction(s): Hives, Hives    HOME MEDICATIONS: Outpatient Medications Prior to Visit  Medication Sig Dispense Refill   acetaminophen (TYLENOL) 500 MG tablet Take 1,000 mg by mouth every 6 (six) hours as needed for moderate pain.     b complex vitamins capsule Take 1 capsule by mouth daily.     calcium carbonate (OS-CAL) 1250 (500 Ca) MG chewable tablet Chew 1 tablet by mouth daily.     CRANBERRY PO Take 3 tablets by mouth in the morning and at bedtime.     famotidine (PEPCID) 20 MG tablet Take 20 mg by mouth daily.     gabapentin (NEURONTIN) 300 MG capsule Take 1 capsule by mouth in the morning and take 4 capsules at bedtime. (Patient taking differently: Take 1 capsule by mouth in the morning, lunch and take 4 capsules at bedtime.) 450 capsule 2   Glucosamine-Chondroit-Vit C-Mn (GLUCOSAMINE CHONDR 1500 COMPLX) CAPS Take 1 capsule by mouth daily. 1500/1200 tablet daily     levothyroxine (SYNTHROID) 25 MCG tablet Take 25 mcg by mouth. Take 1 tablet daily, on MWF and saturday  loratadine (CLARITIN) 10 MG tablet Take 10 mg by mouth daily as needed for allergies.      methocarbamol (ROBAXIN) 500 MG tablet Take 1 tablet (500 mg total) by mouth 4 (four) times daily. (Patient taking differently: Take 500 mg by mouth as needed for muscle spasms.) 45 tablet 0   Multiple Vitamins-Minerals (CENTRUM SILVER 50+WOMEN PO) Take 1 tablet by mouth daily.     naproxen sodium (ALEVE) 220 MG tablet Take 440 mg by mouth daily as needed (pain).     nebivolol (BYSTOLIC) 5 MG tablet Take 1 tablet (5 mg total) by mouth in the morning and at bedtime. 180 tablet 3   Polyethyl Glyc-Propyl Glyc PF (SYSTANE ULTRA PF) 0.4-0.3 % SOLN Place 1 drop into  both eyes in the morning.     spironolactone (ALDACTONE) 25 MG tablet Take 0.5 tablets (12.5 mg total) by mouth daily. (Patient taking differently: Take 12.5 mg by mouth daily. Can take Additional 1/2 Tablet for Elevated Blood Pressure.) 90 tablet 3   predniSONE (DELTASONE) 10 MG tablet 2 tablets once a day for 14 days, 1 tablet once a day for 14 days Orally Once a day for 28 days     No facility-administered medications prior to visit.    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   Hypertension    Hypothyroidism    Obesity    PONV (postoperative nausea and vomiting)    Reflux    buring and dysphagia sx resp to PPI; egd 3/06 neg for Barretts   Tachycardia     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   Yeast infection    hx of     PAST SURGICAL HISTORY: Past Surgical History:  Procedure Laterality Date   AXILLARY LYMPH NODE BIOPSY Left 12/27/2015   Procedure: LEFT AXILLARY SENTINEL LYMPH NODE BIOPSY;  Surgeon: Ovidio Kin, MD;  Location: [facility];  Service: General;  Laterality: Left;   BELPHAROPTOSIS REPAIR Bilateral    BREAST LUMPECTOMY WITH RADIOACTIVE SEED LOCALIZATION Left 12/01/2015   Procedure: LEFT BREAST BIOPSYWITH RADIOACTIVE SEED LOCALIZATION;  Surgeon: Ovidio Kin, MD;  Location: [facility] OR;  Service: General;  Laterality: Left;   BREAST SURGERY     CATARACT EXTRACTION, BILATERAL Bilateral 08/2018   COLONOSCOPY  01/2005   NEG   cortisone injections     right arm; back    EYE SURGERY     cosmetic; eyelid surgery bilat    FINGER SURGERY Left    left index finger/ cyst 02/2007   HARDWARE REMOVAL N/A 06/13/2022   Procedure: Bilateral Thoracic Ten- Removal of Screws;  Surgeon: Donalee Citrin, MD;  Location: [facility] OR;  Service: Neurosurgery;  Laterality: N/A;   KNEE ARTHROSCOPY Right 04/13/2015   Procedure: ARTHROSCOPY RIGHT KNEE WITH MEDIAL MENISCAL DEBRIDEMENT AND CHONDROPLASTY;  Surgeon: Ollen Gross, MD;  Location: [facility]S;  Service: Orthopedics;  Laterality: Right;  KYPHOPLASTY N/A 03/16/2020   Procedure: KYPHOPLASTY THORACIC THREE, THORACIC FOUR, THORACIC SIX;  Surgeon: Shirlean Kelly, MD;  Location: [facility] OR;  Service: Neurosurgery;  Laterality: N/A;   KYPHOPLASTY N/A 06/13/2022   Procedure: Vertobroplasty Thoracic Ten;  Surgeon: Donalee Citrin, MD;  Location: [facility] OR;  Service: Neurosurgery;  Laterality: N/A;   LAMINECTOMY WITH POSTERIOR LATERAL ARTHRODESIS LEVEL 4 N/A 12/20/2021   Procedure: Thoracic Ten-Eleven, Thoracic Eleven-Twelve, Thoracic Twelve-Lumbar One, Lumbar One-Two Posterior Lateral Fusion;  Surgeon: Donalee Citrin, MD;  Location: [facility] OR;  Service: Neurosurgery;  Laterality: N/A;   left rotator cuff repair Left    12/2006   LUMBAR FUSION     x3   LUMBAR LAMINECTOMY     removal of screws  08/2022   TONSILLECTOMY      FAMILY HISTORY: Family History  Problem Relation Age of Onset   Diabetes Mother    Breast cancer Mother        dx. late 23s-early 31s; "metastasis to throat"?   Hodgkin's lymphoma Father 61   Breast cancer Sister        maternal half-sister dx. w/ DCIS in her late 15s;   Other Sister        learning disabilities; lives on her own   Diabetes Sister    Alcohol abuse Brother    Breast cancer Maternal Aunt        dx. 50s   Ovarian cancer Maternal Aunt 59   Breast cancer Paternal Aunt        dx. early 39s   Heart Problems Paternal Aunt    Stroke Maternal Grandfather    Depression Paternal Grandmother    Heart Problems Paternal  Grandmother    Skin cancer Daughter        dx. early 36s   Other Daughter        insterstitial cystitis   Neuropathy Neg Hx     SOCIAL HISTORY: Social History   Socioeconomic History   Marital status: Married    Spouse name: Joe   Number of children: 2   Years of education: 13   Highest education level: Not on file  Occupational History   Occupation: retired  Tobacco Use   Smoking status: Never   Smokeless tobacco: Never  Vaping Use   Vaping Use: Never used  Substance and Sexual Activity   Alcohol use: No   Drug use: Not Currently    Comment: CBD- last time -10/2021   Sexual activity: Yes  Other Topics Concern   Not on file  Social History Narrative   Patient is right handed.   Patient drinks 2-3 cups of caffeine per day.   Social Determinants of Health   Financial Resource Strain: Not on file  Food Insecurity: Not on file  Transportation Needs: Not on file  Physical Activity: Not on file  Stress: Not on file  Social Connections: Not on file  Intimate Partner Violence: Not on file    PHYSICAL EXAM  GENERAL EXAM/CONSTITUTIONAL: Vitals:  Vitals:   05/02/23 1505 05/02/23 1559  BP: (!) 152/94 (!) 161/87  Pulse: 71 69  Weight: 167 lb (75.8 kg)   Height: 4\' 10"  (1.473 m)    Body mass index is 34.9 kg/m. Wt Readings from Last 3 Encounters:  05/02/23 167 lb (75.8 kg)  03/22/23 160 lb 3.2 oz (72.7 kg)  03/04/23 160 lb 6.4 oz (72.8 kg)   Patient is in no distress; well developed, nourished and groomed; neck is supple  MUSCULOSKELETAL: Gait, strength, tone,  movements noted in Neurologic exam below  NEUROLOGIC: MENTAL STATUS:      No data to display         awake, alert, oriented to person, place and time recent and remote memory intact normal attention and concentration language fluent, comprehension intact, naming intact fund of knowledge appropriate  CRANIAL NERVE:  2nd, 3rd, 4th, 6th - Visual fields full to confrontation, extraocular muscles  intact, no nystagmus 5th - facial sensation symmetric 7th - facial strength symmetric 8th - hearing intact 9th - palate elevates symmetrically, uvula midline 11th - shoulder shrug symmetric 12th - tongue protrusion midline  MOTOR:  normal bulk and tone, full strength in the BUE, BLE  SENSORY:  normal and symmetric to light touch  COORDINATION:  finger-nose-finger, fine finger movements normal  REFLEXES:  deep tendon reflexes present and symmetric  GAIT/STATION:  normal   DIAGNOSTIC DATA (LABS, IMAGING, TESTING) - I reviewed patient records, labs, notes, testing and imaging myself where available.  Lab Results  Component Value Date   WBC 7.9 02/18/2023   HGB 14.2 02/18/2023   HCT 41.1 02/18/2023   MCV 87.3 02/18/2023   PLT 383 02/18/2023      Component Value Date/Time   NA 140 03/08/2023 1231   NA 141 11/30/2016 1358   K 4.4 03/08/2023 1231   K 3.5 11/30/2016 1358   CL 100 03/08/2023 1231   CO2 23 03/08/2023 1231   CO2 29 11/30/2016 1358   GLUCOSE 97 03/08/2023 1231   GLUCOSE 98 02/18/2023 1120   GLUCOSE 103 11/30/2016 1358   BUN 11 03/08/2023 1231   BUN 14.2 11/30/2016 1358   CREATININE 0.88 03/08/2023 1231   CREATININE 0.84 10/11/2022 1357   CREATININE 0.8 11/30/2016 1358   CALCIUM 10.0 03/08/2023 1231   CALCIUM 9.5 11/30/2016 1358   PROT 7.0 02/18/2023 1120   PROT 7.1 11/30/2016 1358   ALBUMIN 3.9 02/18/2023 1120   ALBUMIN 4.0 11/30/2016 1358   AST 24 02/18/2023 1120   AST 20 10/11/2022 1357   AST 25 11/30/2016 1358   ALT 15 02/18/2023 1120   ALT 15 10/11/2022 1357   ALT 20 11/30/2016 1358   ALKPHOS 65 02/18/2023 1120   ALKPHOS 71 11/30/2016 1358   BILITOT 0.9 02/18/2023 1120   BILITOT 0.7 10/11/2022 1357   BILITOT 0.55 11/30/2016 1358   GFRNONAA >60 02/18/2023 1120   GFRNONAA >60 10/11/2022 1357   GFRAA >60 03/16/2020 0717   No results found for: "CHOL", "HDL", "LDLCALC", "LDLDIRECT", "TRIG", "CHOLHDL" No results found for: "HGBA1C" Lab  Results  Component Value Date   VITAMINB12 >2000 (H) 02/02/2015   No results found for: "TSH"    ASSESSMENT AND PLAN  82 y.o. year old female with history of hypertension, hyperlipidemia, hypothyroidism, peripheral neuropathy who is presenting with new onset diplopia.  Diplopia described as seeing images by a side-by-side which resolved when closing 1 eye.  Her ophthalmologist gave her a Fresnel prism eyeglasses on the right which corrected her vision.  On exam she did not have any extraocular movement abnormality, there was no abnormal saccade. We were able to elicit double vision with eyeglasses removed but when looking the object with one eye closed, her vision is back to normal with no double vision.  I have informed patient that I suspect this is possibly due to an eye muscle weakness in which the Fresnel prism glasses will help but I will still obtain a MRI to rule out intracranial abnormality.  I will contact her  to go over the result and if normal she will need to follow-up with the ophthalmologist.  Continue to follow-up as scheduled.   1. Diplopia      Patient Instructions  MRI brain without contrast, I will contact you to go over the results Continue to wear your Fresnel prism eyeglasses Follow-up with ophthalmologist as scheduled  Orders Placed This Encounter  Procedures   MR BRAIN WO CONTRAST    No orders of the defined types were placed in this encounter.   No follow-ups on file.    Windell Norfolk, MD 05/02/2023, 6:57 PM  [facility] [address] [place], [place] [address] [phone]

## 2023-05-04 ENCOUNTER — Ambulatory Visit
Admission: RE | Admit: 2023-05-04 | Discharge: 2023-05-04 | Disposition: A | Discharge status: Home / Self Care | Payer: PPO | Source: Ambulatory Visit | Attending: Neurology | Admitting: Neurology

## 2023-05-04 DIAGNOSIS — H532 Diplopia: Secondary | ICD-10-CM | POA: Diagnosis not present

## 2023-05-06 DIAGNOSIS — H532 Diplopia: Secondary | ICD-10-CM | POA: Diagnosis not present

## 2023-05-07 ENCOUNTER — Ambulatory Visit
Admission: RE | Admit: 2023-05-07 | Discharge: 2023-05-07 | Disposition: A | Discharge status: Home / Self Care | Payer: PPO | Source: Ambulatory Visit | Attending: Internal Medicine | Admitting: Internal Medicine

## 2023-05-07 ENCOUNTER — Other Ambulatory Visit: Payer: Self-pay | Admitting: Neurology

## 2023-05-07 ENCOUNTER — Other Ambulatory Visit: Payer: PPO

## 2023-05-07 ENCOUNTER — Encounter: Payer: Self-pay | Admitting: Neurology

## 2023-05-07 DIAGNOSIS — D329 Benign neoplasm of meninges, unspecified: Secondary | ICD-10-CM

## 2023-05-07 DIAGNOSIS — R1011 Right upper quadrant pain: Secondary | ICD-10-CM | POA: Diagnosis not present

## 2023-05-08 ENCOUNTER — Telehealth: Payer: Self-pay | Admitting: Neurology

## 2023-05-08 NOTE — Telephone Encounter (Signed)
[employer] NPR sent to GI [phone] 

## 2023-05-09 DIAGNOSIS — H532 Diplopia: Secondary | ICD-10-CM | POA: Diagnosis not present

## 2023-05-09 DIAGNOSIS — K529 Noninfective gastroenteritis and colitis, unspecified: Secondary | ICD-10-CM | POA: Diagnosis not present

## 2023-05-09 DIAGNOSIS — H5340 Unspecified visual field defects: Secondary | ICD-10-CM | POA: Diagnosis not present

## 2023-05-14 MED ORDER — SPIRONOLACTONE 25 MG PO TABS: 12.5000 mg | ORAL_TABLET | Freq: Every day | ORAL | 0 refills | Status: DC

## 2023-05-15 ENCOUNTER — Ambulatory Visit
Admission: RE | Admit: 2023-05-15 | Discharge: 2023-05-15 | Disposition: A | Discharge status: Home / Self Care | Payer: PPO | Source: Ambulatory Visit | Attending: Neurology | Admitting: Neurology

## 2023-05-15 DIAGNOSIS — D329 Benign neoplasm of meninges, unspecified: Secondary | ICD-10-CM

## 2023-05-15 NOTE — Telephone Encounter (Signed)
[id number] ([facility]) informing the reason we cancelled pt appt for MRI. Need to reschedule with hospital due to severity of contrast allergy.

## 2023-05-15 NOTE — Telephone Encounter (Signed)
Can we make sure patient gets her MRI scheduled at the hospital. Thanks

## 2023-05-16 ENCOUNTER — Other Ambulatory Visit: Payer: Self-pay | Admitting: Neurology

## 2023-05-16 ENCOUNTER — Telehealth: Payer: Self-pay | Admitting: Neurology

## 2023-05-16 DIAGNOSIS — D329 Benign neoplasm of meninges, unspecified: Secondary | ICD-10-CM

## 2023-05-16 DIAGNOSIS — H532 Diplopia: Secondary | ICD-10-CM

## 2023-05-16 MED ORDER — DIPHENHYDRAMINE HCL 50 MG PO CAPS: ORAL_CAPSULE | ORAL | 0 refills | Status: DC

## 2023-05-16 MED ORDER — PREDNISONE 50 MG PO TABS
ORAL_TABLET | ORAL | 0 refills | Status: DC
Start: 2023-05-16 — End: 2023-05-16

## 2023-05-16 MED ORDER — PREDNISONE 50 MG PO TABS: ORAL_TABLET | ORAL | 0 refills | Status: AC

## 2023-05-16 MED ORDER — DIPHENHYDRAMINE HCL 50 MG PO CAPS: ORAL_CAPSULE | ORAL | 0 refills | Status: AC

## 2023-05-16 NOTE — Telephone Encounter (Signed)
Sent to [facility] [phone]

## 2023-05-16 NOTE — Telephone Encounter (Signed)
Pt is asking for a call to discuss the issue with her MRI and contrast.

## 2023-05-16 NOTE — Telephone Encounter (Signed)
Took over call from [facility] CMA. Patient seemed very frustrated with call and fact that messages were being exchanged in my chart instead of a phone call. I listened to patients concerns and frustration over trying to get MRI w/wo contrast and patient is tearful. She states she doesn't want to get this done but both Dr. Teresa Coombs and eye Dr. recommend. Explained that after I called her this morning the order was placed at [facility] and she stated it wasn't. I advised as soon as we hung up I would meet with the scheduler and Dr. Teresa Coombs and make sure order was placed. Patient expressed concerns over customer service she received on call with other staff. I offered apology for miscommunications. Patient appreciate of apology an attempt to get answers and MRI scheduled  Spoke with Val Eagle and Dr. Teresa Coombs, order placed at [facility] for MRI with and without contrast and allergy was noted in comment section. Dr. Teresa Coombs stated this was not a STAT MRI and that MRI with and with out contrast would need to be done on say for comparison readings.  Call to [facility] at [facility] radiology scheduling at [phone] to confirm order was received. Order verified.   Call back to patient and reviewed above information and number given. Patient apologized for any miscommunication and frustration on her end. Explain no apology necessary that I understand when we are worried our emotions can be heightened. Patient appreciative of call.  Return call from patient stating she had MRI scheduled for 05/27/23 at 2pm and Melissa told her she would need the "13 hr medication" prior to MRI due to allergy and she has it before. She stated it could be sen tot her regular pharmacy. Advised I would inform Dr. Teresa Coombs. Patient appreciative.   Dr. Teresa Coombs informed of need for medication.

## 2023-05-16 NOTE — Telephone Encounter (Signed)
Yes, MRI Brain with and without contrast.

## 2023-05-16 NOTE — Telephone Encounter (Signed)
During the phone call the pt mentioned that she didn't want to have mri but that she has to because dr.s recommend it. I stated that as the pt you always have the right to refuse and that seemed to infuriate her more before Audrey [name] took over the call

## 2023-05-16 NOTE — Telephone Encounter (Signed)
Returned call to pt who asked for one from a mychart msg who became irate at me and stated I should have called the 1st time and she stated that I was incompetent and became so hateful on the phone with me that Audrey jones rn had to take the call. Apparently she is highly allergic t contrast and stated to Audrey that she needed a different order put in. Audrey took over the phone call.

## 2023-05-16 NOTE — Telephone Encounter (Signed)
This encounter was created in error - please disregard.

## 2023-05-16 NOTE — Telephone Encounter (Signed)
Please inform patient that 13 hours mediations sent to her local [facility]

## 2023-05-16 NOTE — Telephone Encounter (Signed)
Call to patient, she states [facility] wont do MRI due to patient allergy to contrast dye. She will need referral to hospital for MRI brain with contrast. Dr. Teresa Coombs made aware

## 2023-05-27 ENCOUNTER — Ambulatory Visit (HOSPITAL_COMMUNITY)
Admission: RE | Admit: 2023-05-27 | Discharge: 2023-05-27 | Disposition: A | Discharge status: Home / Self Care | Payer: PPO | Source: Ambulatory Visit | Attending: Neurology | Admitting: Neurology

## 2023-05-27 DIAGNOSIS — D329 Benign neoplasm of meninges, unspecified: Secondary | ICD-10-CM | POA: Insufficient documentation

## 2023-05-27 DIAGNOSIS — H532 Diplopia: Secondary | ICD-10-CM | POA: Insufficient documentation

## 2023-05-27 DIAGNOSIS — D320 Benign neoplasm of cerebral meninges: Secondary | ICD-10-CM | POA: Diagnosis not present

## 2023-05-27 MED ORDER — GADOBUTROL 1 MMOL/ML IV SOLN
7.5000 mL | Freq: Once | INTRAVENOUS | Status: AC | PRN
  Administered 2023-05-27: 7.5 mL via INTRAVENOUS

## 2023-06-02 ENCOUNTER — Encounter: Payer: Self-pay | Admitting: Neurology

## 2023-06-06 ENCOUNTER — Other Ambulatory Visit (HOSPITAL_COMMUNITY): Payer: Self-pay | Admitting: Neurosurgery

## 2023-06-06 DIAGNOSIS — D320 Benign neoplasm of cerebral meninges: Secondary | ICD-10-CM

## 2023-06-11 DIAGNOSIS — M79643 Pain in unspecified hand: Secondary | ICD-10-CM | POA: Diagnosis not present

## 2023-06-11 DIAGNOSIS — M1990 Unspecified osteoarthritis, unspecified site: Secondary | ICD-10-CM | POA: Diagnosis not present

## 2023-06-11 DIAGNOSIS — M7989 Other specified soft tissue disorders: Secondary | ICD-10-CM | POA: Diagnosis not present

## 2023-06-11 DIAGNOSIS — Z79899 Other long term (current) drug therapy: Secondary | ICD-10-CM | POA: Diagnosis not present

## 2023-06-11 DIAGNOSIS — R7982 Elevated C-reactive protein (CRP): Secondary | ICD-10-CM | POA: Diagnosis not present

## 2023-06-11 DIAGNOSIS — N289 Disorder of kidney and ureter, unspecified: Secondary | ICD-10-CM | POA: Diagnosis not present

## 2023-06-11 DIAGNOSIS — M2550 Pain in unspecified joint: Secondary | ICD-10-CM | POA: Diagnosis not present

## 2023-06-11 DIAGNOSIS — M797 Fibromyalgia: Secondary | ICD-10-CM | POA: Diagnosis not present

## 2023-06-11 DIAGNOSIS — M0579 Rheumatoid arthritis with rheumatoid factor of multiple sites without organ or systems involvement: Secondary | ICD-10-CM | POA: Diagnosis not present

## 2023-06-18 NOTE — Progress Notes (Signed)
Cardiology Clinic Note   Patient Name: Audrey [name] Date of Encounter: 06/18/2023  Primary Care Provider:  Ollen Bowl, MD Primary Cardiologist:  Armanda Magic, MD  Patient Profile    82 year old female with history of sinus tachycardia, unable to tolerate beta-blocker due to fatigue and sleepiness, hypertension, and hypothyroidism.  She was seen in the ED in 02/18/2023 for hypertension after sustaining a mechanical fall in her bathroom and hitting her head on the mirror CT scan was negative for acute intracranial abnormality, she was found to have hypertension with a blood pressure of 161/100.   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   Hypertension    Hypothyroidism    Obesity    PONV (postoperative nausea and vomiting)    Reflux    buring and dysphagia sx resp to PPI; egd 3/06 neg for Barretts   Tachycardia     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   Yeast infection    hx of    Past Surgical History:  Procedure Laterality Date   AXILLARY LYMPH NODE BIOPSY Left 12/27/2015   Procedure: LEFT AXILLARY SENTINEL LYMPH NODE BIOPSY;  Surgeon: Ovidio Kin, MD;  Location: [facility];  Service: General;  Laterality: Left;   BELPHAROPTOSIS REPAIR Bilateral    BREAST LUMPECTOMY WITH RADIOACTIVE SEED LOCALIZATION Left 12/01/2015   Procedure: LEFT BREAST BIOPSYWITH RADIOACTIVE SEED LOCALIZATION;  Surgeon: Ovidio Kin, MD;  Location: [facility] OR;  Service: General;  Laterality:  Left;   BREAST SURGERY     CATARACT EXTRACTION, BILATERAL Bilateral 08/2018   COLONOSCOPY  01/2005   NEG   cortisone injections     right arm; back   EYE SURGERY     cosmetic; eyelid surgery bilat    FINGER SURGERY Left    left index finger/ cyst 02/2007   HARDWARE REMOVAL N/A 06/13/2022   Procedure: Bilateral Thoracic Ten- Removal of Screws;  Surgeon: Donalee Citrin, MD;  Location: [facility] OR;  Service: Neurosurgery;  Laterality: N/A;   KNEE ARTHROSCOPY Right 04/13/2015   Procedure: ARTHROSCOPY RIGHT KNEE WITH MEDIAL MENISCAL DEBRIDEMENT AND CHONDROPLASTY;  Surgeon: Ollen Gross, MD;  Location: [facility];  Service: Orthopedics;  Laterality: Right;   KYPHOPLASTY N/A 03/16/2020   Procedure: KYPHOPLASTY THORACIC THREE, THORACIC FOUR, THORACIC SIX;  Surgeon: Shirlean Kelly, MD;  Location: [facility] OR;  Service: Neurosurgery;  Laterality: N/A;   KYPHOPLASTY N/A 06/13/2022   Procedure: Vertobroplasty Thoracic Ten;  Surgeon: Donalee Citrin, MD;  Location: [facility] OR;  Service: Neurosurgery;  Laterality: N/A;   LAMINECTOMY WITH POSTERIOR LATERAL ARTHRODESIS LEVEL 4 N/A 12/20/2021   Procedure: Thoracic Ten-Eleven, Thoracic Eleven-Twelve, Thoracic Twelve-Lumbar One, Lumbar One-Two Posterior Lateral Fusion;  Surgeon: Donalee Citrin, MD;  Location: [facility] OR;  Service: Neurosurgery;  Laterality: N/A;   left rotator cuff repair Left    12/2006   LUMBAR FUSION     x3  LUMBAR LAMINECTOMY     removal of screws  08/2022   TONSILLECTOMY      Allergies  Allergies  Allergen Reactions   Gadobenate Anaphylaxis and Shortness Of Breath   Iodinated Contrast Media Shortness Of Breath    Other Reaction(s): SOB and Nausea   Other Swelling    Cat gut sutures Cats and dogs                Derma bond- intolerance- itching- tolerates with Benadryl       Amlodipine Other (See Comments)    Made pt overly sleepy stated "she could not function"   Codeine Other (See Comments)    "Drives me crazy"   Cymbalta [Duloxetine Hcl] Diarrhea    Rosuvastatin     Other Reaction(s): myalgia   Trileptal [Oxcarbazepine]     Low Sodium    Amoxicillin Nausea And Vomiting and Rash    Other Reaction(s): rash, vomiting   Nitrofurantoin Rash and Itching    Other Reaction(s): Hives, Hives    History of Present Illness    Mrs. Hinton returns today for ongoing assessment and management of hypertension, had recently been started on spironolactone  mg daily by primary care provider just prior to last office visit with me on 03/22/2023.    The patient had a fall prior to that time in March and continued to have rib pain from the fall.  She had been placed on prednisone by primary care with some improvement in symptoms.  Home blood pressures showed blood pressure improved to 136/80 range but was found to be elevated in the evenings.  She was advised to take spironolactone 12.5 mg in the morning and if blood pressure was elevated 160/90 she was to take the spironolactone 12.5 mg in the evening as well.  She comes today with list of blood pressures at home.  They are ranging very well without any significant elevations or areas of hypotension.  Blood pressures ranging from 136/84 to as high as 159/104 which was very transient she did retake it and it had come down.  When I checked her recent labs that showed that she was hyponatremic.  I stop the spironolactone temporarily.  We are rechecking labs today. Home Medications    Current Outpatient Medications  Medication Sig Dispense Refill   acetaminophen (TYLENOL) 500 MG tablet Take 1,000 mg by mouth every 6 (six) hours as needed for moderate pain.     b complex vitamins capsule Take 1 capsule by mouth daily.     calcium carbonate (OS-CAL) 1250 (500 Ca) MG chewable tablet Chew 1 tablet by mouth daily.     CRANBERRY PO Take 3 tablets by mouth in the morning and at bedtime.     diphenhydrAMINE (BENADRYL) 50 MG capsule Take 1 hour before contrast media injection. 1 capsule 0   famotidine (PEPCID) 20 MG  tablet Take 20 mg by mouth daily.     gabapentin (NEURONTIN) 300 MG capsule Take 1 capsule by mouth in the morning and take 4 capsules at bedtime. (Patient taking differently: Take 1 capsule by mouth in the morning, lunch and take 4 capsules at bedtime.) 450 capsule 2   Glucosamine-Chondroit-Vit C-Mn (GLUCOSAMINE CHONDR 1500 COMPLX) CAPS Take 1 capsule by mouth daily. 1500/1200 tablet daily     levothyroxine (SYNTHROID) 25 MCG tablet Take 25 mcg by mouth. Take 1 tablet daily, on MWF and saturday     loratadine (CLARITIN) 10 MG tablet Take 10 mg by mouth daily as needed  for allergies.      methocarbamol (ROBAXIN) 500 MG tablet Take 1 tablet (500 mg total) by mouth 4 (four) times daily. (Patient taking differently: Take 500 mg by mouth as needed for muscle spasms.) 45 tablet 0   Multiple Vitamins-Minerals (CENTRUM SILVER 50+WOMEN PO) Take 1 tablet by mouth daily.     naproxen sodium (ALEVE) 220 MG tablet Take 440 mg by mouth daily as needed (pain).     nebivolol (BYSTOLIC) 5 MG tablet Take 1 tablet (5 mg total) by mouth in the morning and at bedtime. 180 tablet 3   Polyethyl Glyc-Propyl Glyc PF (SYSTANE ULTRA PF) 0.4-0.3 % SOLN Place 1 drop into both eyes in the morning.     predniSONE (DELTASONE) 50 MG tablet Take 1 tablet by mouth at 13 hours, 7 hours and 1 hour before contrast media injection. 3 tablet 0   spironolactone (ALDACTONE) 25 MG tablet Take 0.5 tablets (12.5 mg total) by mouth daily. Can take Additional 1/2 Tablet for Elevated Blood Pressure. 90 tablet 0   No current facility-administered medications for this visit.     Family History    Family History  Problem Relation Age of Onset   Diabetes Mother    Breast cancer Mother        dx. late 2s-early 36s; "metastasis to throat"?   Hodgkin's lymphoma Father 40   Breast cancer Sister        maternal half-sister dx. w/ DCIS in her late 73s;   Other Sister        learning disabilities; lives on her own   Diabetes Sister    Alcohol  abuse Brother    Breast cancer Maternal Aunt        dx. 50s   Ovarian cancer Maternal Aunt 59   Breast cancer Paternal Aunt        dx. early 71s   Heart Problems Paternal Aunt    Stroke Maternal Grandfather    Depression Paternal Grandmother    Heart Problems Paternal Grandmother    Skin cancer Daughter        dx. early 75s   Other Daughter        insterstitial cystitis   Neuropathy Neg Hx    She indicated that her mother is deceased. She indicated that her father is deceased. She indicated that her sister is alive. She indicated that her brother is deceased. She indicated that her maternal grandmother is deceased. She indicated that her maternal grandfather is deceased. She indicated that her paternal grandmother is deceased. She indicated that her paternal grandfather is deceased. She indicated that her daughter is alive. She indicated that her son is alive. She indicated that all of her five maternal aunts are deceased. She indicated that her maternal uncle is deceased. She indicated that both of her paternal aunts are deceased. She indicated that her paternal uncle is deceased. She indicated that the status of her neg hx is unknown.  Social History    Social History   Socioeconomic History   Marital status: Married    Spouse name: Joe   Number of children: 2   Years of education: 13   Highest education level: Not on file  Occupational History   Occupation: retired  Tobacco Use   Smoking status: Never   Smokeless tobacco: Never  Vaping Use   Vaping status: Never Used  Substance and Sexual Activity   Alcohol use: No   Drug use: Not Currently    Comment: CBD- last time -  10/2021   Sexual activity: Yes  Other Topics Concern   Not on file  Social History Narrative   Patient is right handed.   Patient drinks 2-3 cups of caffeine per day.   Social Determinants of Health   Financial Resource Strain: Not on file  Food Insecurity: Not on file  Transportation Needs: Not on  file  Physical Activity: Not on file  Stress: Not on file  Social Connections: Not on file  Intimate Partner Violence: Not on file     Review of Systems    General:  No chills, fever, night sweats or weight changes.  Cardiovascular:  No chest pain, dyspnea on exertion, edema, orthopnea, palpitations, paroxysmal nocturnal dyspnea. Dermatological: No rash, lesions/masses Respiratory: No cough, dyspnea Urologic: No hematuria, dysuria Abdominal:   No nausea, vomiting, diarrhea, bright red blood per rectum, melena, or hematemesis Neurologic:  No visual changes, wkns, changes in mental status. All other systems reviewed and are otherwise negative except as noted above.       Physical Exam    VS:  There were no vitals taken for this visit. , BMI There is no height or weight on file to calculate BMI.     GEN: Well nourished, well developed, in no acute distress. HEENT: normal. Neck: Supple, no JVD, carotid bruits, or masses. Cardiac: RRR, no murmurs, rubs, or gallops. No clubbing, cyanosis, edema.  Radials/DP/PT 2+ and equal bilaterally.  Respiratory:  Respirations regular and unlabored, clear to auscultation bilaterally. GI: Soft, nontender, nondistended, BS + x 4. MS: no deformity or atrophy. Skin: warm and dry, no rash. Neuro:  Strength and sensation are intact. Psych: Normal affect.      Lab Results  Component Value Date   WBC 7.9 02/18/2023   HGB 14.2 02/18/2023   HCT 41.1 02/18/2023   MCV 87.3 02/18/2023   PLT 383 02/18/2023   Lab Results  Component Value Date   CREATININE 0.88 03/08/2023   BUN 11 03/08/2023   NA 140 03/08/2023   K 4.4 03/08/2023   CL 100 03/08/2023   CO2 23 03/08/2023   Lab Results  Component Value Date   ALT 15 02/18/2023   AST 24 02/18/2023   ALKPHOS 65 02/18/2023   BILITOT 0.9 02/18/2023   No results found for: "CHOL", "HDL", "LDLCALC", "LDLDIRECT", "TRIG", "CHOLHDL"  No results found for: "HGBA1C"   Review of Prior Studies    See  scanned report for echocardiogram on 04/16/2012    See scanned report for nuclear medicine study on 04/16/2012.  Assessment & Plan   1.  Hypertension: Blood pressures been very labile.  I had started her on spironolactone 12.5 mg daily she is intolerant to multiple antihypertensives.  Labs revealed a sodium of 131.  I did stop attempt primarily and rechecking her labs.  She avoids salt, she states that she does have a water softener at home but states she does have a filter on her refrigerator where she does get her water to drink from.  I will leave her alone on current medication regimen and may need to consider adding a different antihypertensive.  She is currently on nebivolol, 5 mg twice daily.  May need to consider hydralazine if blood pressure remains elevated.  2.  Rheumatoid arthritis: Newly diagnosed by PCP and is now on Plaquenil.         Signed, Bettey Mare. Liborio Nixon, ANP, AACC   06/18/2023 2:36 PM      Office [phone] Fax [phone]  Notice:  This dictation was prepared with Dragon dictation along with smaller phrase technology. Any transcriptional errors that result from this process are unintentional and may not be corrected upon review.

## 2023-06-19 DIAGNOSIS — I10 Essential (primary) hypertension: Secondary | ICD-10-CM | POA: Diagnosis not present

## 2023-06-19 LAB — BASIC METABOLIC PANEL
BUN/Creatinine Ratio: 19 (ref 12–28)
BUN: 21 mg/dL (ref 8–27)
CO2: 22 mmol/L (ref 20–29)
Calcium: 10.5 mg/dL — ABNORMAL HIGH (ref 8.7–10.3)
Chloride: 93 mmol/L — ABNORMAL LOW (ref 96–106)
Creatinine, Ser: 1.09 mg/dL — ABNORMAL HIGH (ref 0.57–1.00)
Glucose: 84 mg/dL (ref 70–99)
Potassium: 4.9 mmol/L (ref 3.5–5.2)
Sodium: 131 mmol/L — ABNORMAL LOW (ref 134–144)
eGFR: 52 mL/min/{1.73_m2} — ABNORMAL LOW (ref >59)

## 2023-06-20 ENCOUNTER — Telehealth: Payer: Self-pay

## 2023-06-20 NOTE — Telephone Encounter (Addendum)
Called patient regarding results. Left message for patient to call office.----- Message from Joni Reining sent at 06/20/2023  7:01 AM EDT ----- I have reviewed her labs. Creatinine is slightly elevated on spironolactone. Sodium is low. Discontinue spironolactone.  Repeat BMET in two weeks.   KL

## 2023-06-24 ENCOUNTER — Telehealth: Payer: Self-pay

## 2023-06-24 ENCOUNTER — Ambulatory Visit: Payer: PPO | Attending: Adult Health | Admitting: Adult Health

## 2023-06-24 ENCOUNTER — Encounter: Payer: Self-pay | Admitting: Adult Health

## 2023-06-24 VITALS — BP 128/80 | HR 67 | Ht <= 58 in | Wt 164.6 lb

## 2023-06-24 DIAGNOSIS — I10 Essential (primary) hypertension: Secondary | ICD-10-CM

## 2023-06-24 DIAGNOSIS — M0520 Rheumatoid vasculitis with rheumatoid arthritis of unspecified site: Secondary | ICD-10-CM | POA: Diagnosis not present

## 2023-06-24 NOTE — Telephone Encounter (Signed)
-----   Message from Audrey [name] sent at 06/20/2023  7:01 AM EDT ----- I have reviewed her labs. Creatinine is slightly elevated on spironolactone. Sodium is low. Discontinue spironolactone.  Repeat BMET in two weeks.   KL

## 2023-06-24 NOTE — Patient Instructions (Signed)
Medication Instructions:  Stop Spironolactone  *If you need a refill on your cardiac medications before your next appointment, please call your pharmacy*   Lab Work: BMET, Magnesium Level If you have labs (blood work) drawn today and your tests are completely normal, you will receive your results only by: MyChart Message (if you have MyChart) OR A paper copy in the mail If you have any lab test that is abnormal or we need to change your treatment, we will call you to review the results.   Testing/Procedures: No Testing   Follow-Up: At [facility], you and your health needs are our priority.  As part of our continuing mission to provide you with exceptional heart care, we have created designated Provider Care Teams.  These Care Teams include your primary Cardiologist (physician) and Advanced Practice Providers (APPs -  Physician Assistants and Nurse Practitioners) who all work together to provide you with the care you need, when you need it.  We recommend signing up for the patient portal called "MyChart".  Sign up information is provided on this After Visit Summary.  MyChart is used to connect with patients for Virtual Visits (Telemedicine).  Patients are able to view lab/test results, encounter notes, upcoming appointments, etc.  Non-urgent messages can be sent to your provider as well.   To learn more about what you can do with MyChart, go to [email or web address].    Your next appointment:   6 month(s)  Provider:   Armanda Magic, MD

## 2023-06-24 NOTE — Telephone Encounter (Addendum)
Called patient regarding results. Left detailed message for patient. Patient advised to discontinue Spironolactone and to repeat Labs in two week. Letter with Lab order mailed to patient .----- Message from Joni Reining sent at 06/20/2023  7:01 AM EDT ----- I have reviewed her labs. Creatinine is slightly elevated on spironolactone. Sodium is low. Discontinue spironolactone.  Repeat BMET in two weeks.   KL

## 2023-06-26 DIAGNOSIS — R194 Change in bowel habit: Secondary | ICD-10-CM | POA: Diagnosis not present

## 2023-06-27 ENCOUNTER — Telehealth: Payer: Self-pay

## 2023-06-27 NOTE — Telephone Encounter (Addendum)
Called patient regarding results. Patient had understanding of results.----- Message from Joni Reining sent at 06/25/2023  7:34 AM EDT ----- I have reviewed her labs.  Her kidney function has improved. The sodium level is improving. Magnesium is low normal.  No changers in her regimen. Stay off of spironolactone, keep up with BP readings. If BP > 150/90 she is to call us.   KL

## 2023-08-02 ENCOUNTER — Other Ambulatory Visit: Payer: Self-pay | Admitting: Oncology

## 2023-08-02 DIAGNOSIS — Z006 Encounter for examination for normal comparison and control in clinical research program: Secondary | ICD-10-CM

## 2023-08-14 ENCOUNTER — Telehealth: Payer: Self-pay | Admitting: Adult Health

## 2023-08-14 NOTE — Telephone Encounter (Signed)
Spoke with patient states she has been having swelling in her feet for over a month. She states she has now have swelling in her fingers. She can longer get her rings on and off. She does not think she has gained weight. She has compression stockings but has not worn them in 3 weeks. She does elevate her legs sometimes. Denies any SOB, chest pain, headache, nausea or vomiting. She states she has never had a fluid problem so this concerning for her. Did advise to wear compression stockings during the day and elevate legs as much as possible. She will start monitoring her weight every morning. She will give [place] a call if she gains 3lbs in 24 hours and 5lbs in a week. Will forward to provider.

## 2023-08-14 NOTE — Telephone Encounter (Signed)
Pt c/o swelling/edema: STAT if pt has developed SOB within 24 hours  If swelling, where is the swelling located? Feet and fingers  How much weight have you gained and in what time span? N/A  Have you gained 2 pounds in a day or 5 pounds in a week? N/A  Do you have a log of your daily weights (if so, list)? No   Are you currently taking a fluid pill? Was taken off of fluid pill around 2 months ago  Are you currently SOB? No; shaky  Have you traveled recently in a car or plane for an extended period of time? No

## 2023-08-21 DIAGNOSIS — M797 Fibromyalgia: Secondary | ICD-10-CM | POA: Diagnosis not present

## 2023-08-21 DIAGNOSIS — R7982 Elevated C-reactive protein (CRP): Secondary | ICD-10-CM | POA: Diagnosis not present

## 2023-08-21 DIAGNOSIS — M79643 Pain in unspecified hand: Secondary | ICD-10-CM | POA: Diagnosis not present

## 2023-08-21 DIAGNOSIS — M7989 Other specified soft tissue disorders: Secondary | ICD-10-CM | POA: Diagnosis not present

## 2023-08-21 DIAGNOSIS — Z79899 Other long term (current) drug therapy: Secondary | ICD-10-CM | POA: Diagnosis not present

## 2023-08-21 DIAGNOSIS — M1990 Unspecified osteoarthritis, unspecified site: Secondary | ICD-10-CM | POA: Diagnosis not present

## 2023-08-21 DIAGNOSIS — N289 Disorder of kidney and ureter, unspecified: Secondary | ICD-10-CM | POA: Diagnosis not present

## 2023-08-21 DIAGNOSIS — M2550 Pain in unspecified joint: Secondary | ICD-10-CM | POA: Diagnosis not present

## 2023-08-21 DIAGNOSIS — M0579 Rheumatoid arthritis with rheumatoid factor of multiple sites without organ or systems involvement: Secondary | ICD-10-CM | POA: Diagnosis not present

## 2023-08-22 DIAGNOSIS — H532 Diplopia: Secondary | ICD-10-CM | POA: Diagnosis not present

## 2023-08-22 DIAGNOSIS — H5340 Unspecified visual field defects: Secondary | ICD-10-CM | POA: Diagnosis not present

## 2023-08-22 DIAGNOSIS — D320 Benign neoplasm of cerebral meninges: Secondary | ICD-10-CM | POA: Diagnosis not present

## 2023-08-27 DIAGNOSIS — H26492 Other secondary cataract, left eye: Secondary | ICD-10-CM | POA: Diagnosis not present

## 2023-08-27 DIAGNOSIS — H52203 Unspecified astigmatism, bilateral: Secondary | ICD-10-CM | POA: Diagnosis not present

## 2023-08-27 DIAGNOSIS — H353112 Nonexudative age-related macular degeneration, right eye, intermediate dry stage: Secondary | ICD-10-CM | POA: Diagnosis not present

## 2023-08-27 DIAGNOSIS — D3132 Benign neoplasm of left choroid: Secondary | ICD-10-CM | POA: Diagnosis not present

## 2023-08-27 DIAGNOSIS — Z961 Presence of intraocular lens: Secondary | ICD-10-CM | POA: Diagnosis not present

## 2023-09-06 ENCOUNTER — Ambulatory Visit (HOSPITAL_COMMUNITY)
Admission: RE | Admit: 2023-09-06 | Discharge: 2023-09-06 | Disposition: A | Discharge status: Home / Self Care | Payer: PPO | Source: Ambulatory Visit | Attending: Neurosurgery | Admitting: Neurosurgery

## 2023-09-06 DIAGNOSIS — D320 Benign neoplasm of cerebral meninges: Secondary | ICD-10-CM | POA: Diagnosis not present

## 2023-09-06 MED ORDER — GADOBUTROL 1 MMOL/ML IV SOLN
7.0000 mL | Freq: Once | INTRAVENOUS | Status: AC | PRN
  Administered 2023-09-06: 7 mL via INTRAVENOUS

## 2023-09-11 DIAGNOSIS — E7800 Pure hypercholesterolemia, unspecified: Secondary | ICD-10-CM | POA: Diagnosis not present

## 2023-09-11 DIAGNOSIS — Z23 Encounter for immunization: Secondary | ICD-10-CM | POA: Diagnosis not present

## 2023-09-11 DIAGNOSIS — I700 Atherosclerosis of aorta: Secondary | ICD-10-CM | POA: Diagnosis not present

## 2023-09-11 DIAGNOSIS — Z0000 Encounter for general adult medical examination without abnormal findings: Secondary | ICD-10-CM | POA: Diagnosis not present

## 2023-09-11 DIAGNOSIS — E559 Vitamin D deficiency, unspecified: Secondary | ICD-10-CM | POA: Diagnosis not present

## 2023-09-11 DIAGNOSIS — I4719 Other supraventricular tachycardia: Secondary | ICD-10-CM | POA: Diagnosis not present

## 2023-09-11 DIAGNOSIS — D329 Benign neoplasm of meninges, unspecified: Secondary | ICD-10-CM | POA: Diagnosis not present

## 2023-09-11 DIAGNOSIS — E039 Hypothyroidism, unspecified: Secondary | ICD-10-CM | POA: Diagnosis not present

## 2023-09-11 DIAGNOSIS — I10 Essential (primary) hypertension: Secondary | ICD-10-CM | POA: Diagnosis not present

## 2023-09-11 DIAGNOSIS — I5189 Other ill-defined heart diseases: Secondary | ICD-10-CM | POA: Diagnosis not present

## 2023-09-11 DIAGNOSIS — M4696 Unspecified inflammatory spondylopathy, lumbar region: Secondary | ICD-10-CM | POA: Diagnosis not present

## 2023-09-15 ENCOUNTER — Other Ambulatory Visit: Payer: Self-pay | Admitting: Adult Health

## 2023-09-17 DIAGNOSIS — D320 Benign neoplasm of cerebral meninges: Secondary | ICD-10-CM | POA: Diagnosis not present

## 2023-09-26 DIAGNOSIS — R7982 Elevated C-reactive protein (CRP): Secondary | ICD-10-CM | POA: Diagnosis not present

## 2023-09-26 DIAGNOSIS — M797 Fibromyalgia: Secondary | ICD-10-CM | POA: Diagnosis not present

## 2023-09-26 DIAGNOSIS — M1990 Unspecified osteoarthritis, unspecified site: Secondary | ICD-10-CM | POA: Diagnosis not present

## 2023-09-26 DIAGNOSIS — M0579 Rheumatoid arthritis with rheumatoid factor of multiple sites without organ or systems involvement: Secondary | ICD-10-CM | POA: Diagnosis not present

## 2023-09-26 DIAGNOSIS — M7989 Other specified soft tissue disorders: Secondary | ICD-10-CM | POA: Diagnosis not present

## 2023-09-26 DIAGNOSIS — M2550 Pain in unspecified joint: Secondary | ICD-10-CM | POA: Diagnosis not present

## 2023-09-26 DIAGNOSIS — Z79899 Other long term (current) drug therapy: Secondary | ICD-10-CM | POA: Diagnosis not present

## 2023-09-26 DIAGNOSIS — N289 Disorder of kidney and ureter, unspecified: Secondary | ICD-10-CM | POA: Diagnosis not present

## 2023-09-26 DIAGNOSIS — M79643 Pain in unspecified hand: Secondary | ICD-10-CM | POA: Diagnosis not present

## 2023-10-07 ENCOUNTER — Encounter: Payer: Self-pay | Admitting: Adult Health

## 2023-10-07 ENCOUNTER — Ambulatory Visit: Payer: PPO | Admitting: Adult Health

## 2023-10-07 VITALS — BP 122/78 | HR 84 | Ht 59.0 in | Wt 167.0 lb

## 2023-10-07 DIAGNOSIS — M546 Pain in thoracic spine: Secondary | ICD-10-CM

## 2023-10-07 DIAGNOSIS — G609 Hereditary and idiopathic neuropathy, unspecified: Secondary | ICD-10-CM

## 2023-10-07 NOTE — Progress Notes (Signed)
PATIENT: Audrey [name] DOB: 11-13-1941  REASON FOR VISIT: follow up HISTORY FROM: patient  Chief Complaint  Patient presents with   RM 4    Patient is here alone for follow-up for neuropathy & back pain. She states back pain is worse some days and not so bad on others. The neuropathy is a little worse in her feet. A little number, but not constantly. She has had a lot of swelling but she feels that is under control with her medication.      HISTORY OF PRESENT ILLNESS: Today 10/07/23:  Audrey [name] is a 82 y.o. female with a history of peripheral neuropathy. Returns today for follow-up.  She remains on gabapentin taking 300 mg in the morning, at noon and 4 tablets at bedtime.  She states that this overall controls her discomfort.  She states that she still has pain in the right thoracic region.  She has followed up with Dr. Wynetta Emery but nothing further they can do at this time.  She states that it has gotten better but not completely resolved.  Denies any significant changes with her gait or balance.  She states that she did have a fall yesterday.  States that she was out in her yard and tripped over a [place].  She did hit her forehead but fortunately no significant bruising.  She does have a minor scrape.  Not on blood thinners.  Reports that she has recently been diagnosed with rheumatoid arthritis.   03/04/23: Audrey [name] is a 82 y.o. female with a history of peripheral neuropathy. Returns today for follow-up.  Patient states that her neuropathy is under relatively good control.  She does have pain across the center of the right foot and under the toes.  She also reports some joint pain in the right hand and shoulder.  Her primary care has referred her to rheumatology as her ANA and sedimentation rate was elevated.  . Balance is worse- has had 3 back surgeries last one in July 2023. Uses a cane. No recent falls. Still feels like she wants to lean forward.   Right thoracic pain-  not stabbing pain but still uncomfortable. Doing dry needling and it seems to help. Has had ESI but it doesn't last- longest it last is 2.5 weeks. Trileptal made her Na low- had to go to the hospital. Na has slowly gotten back to normal.    04/12/21: Audrey [name] is a 82 year old female with a history of peripheral neuropathy.  She returns today for follow-up.  She reports that she has noticed more discomfort at nighttime.  She states that she does not have pain but describes it as itching.  In the feet.  She states that the cream helps but it takes a while before she notices the benefit.  Reports that she continues to have numbness in the feet.  States that her feet feel cold but are warm to the touch.  She states that she takes 1200 mg of gabapentin about 30 minutes before bedtime she tried to reduce to 900 mg but her discomfort increased.  She continues to take 300 mg in the morning denies any discomfort throughout the day just at bedtime.  Patient also reports that she has been having pain in the upper thighs in the groin region that radiates to the inner part of the thighs.  She states that she also has pain down the lateral aspect of both thighs.  The discomfort comes and goes.  She  followed up with Dr. Wynetta Emery and had an MRI of the back and hips.  I have not seen these images.  She reports that he told her there was no further work-up.  She also saw Dr. Lequita Halt and was told that it was spinal stenosis and advised that she should follow-up with her back surgeon.  I have not reviewed the notes from either doctors.  The patient is asking for my opinion.  10/11/20: Audrey [name] is a 82 year old female with a history of peripheral neuropathy.  She returns today for follow-up.  She states that there are some nights she is unable to sleep due to her neuropathy.  She states that most the time it feels as if her feet are itching.  This has always been how her neuropathy presented.  She does not have any significant pain  associated with her neuropathy.  She reports that she adjusted her gabapentin and some nights will take 4 to 5 tablets at bedtime.  She reports that the increase in gabapentin was helpful.  She is currently taking gabapentin 300 mg in the morning and 1200 to 1500 mg at bedtime.  She denies any side effects from gabapentin.  Denies any changes with her gait or balance.  She does report back in March she tripped and fractured her neck and had to have surgery.  Since then she is not had any additional falls.  She returns today for an evaluation.  HISTORY 07/09/19:   Audrey [name] is a 82 year old female with a history of peripheral neuropathy.  She returns today for follow-up.  She continues to take gabapentin 300 mg in the morning and at noon and 600 mg at bedtime.  She states that this continues to work well for her.  Over the last 6 months she has had 3 episodes where she will have a exacerbation of pain in her feet.  She describes it as a sharp shooting pain that can last for a while.  She states when this does happen she will take an extra gabapentin and it resolves the pain.  She denies any significant changes with her gait or balance.  She states that her balance is not as good as it was because she is not able to go to yoga class to the pandemic.  She denies any new symptoms.  She returns today for an evaluation.  REVIEW OF SYSTEMS: Out of a complete 14 system review of symptoms, the patient complains only of the following symptoms, and all other reviewed systems are negative.  See HPI  ALLERGIES: Allergies  Allergen Reactions   Gadobenate Anaphylaxis and Shortness Of Breath   Iodinated Contrast Media Shortness Of Breath    Other Reaction(s): SOB and Nausea   Other Swelling    Cat gut sutures Cats and dogs                Derma bond- intolerance- itching- tolerates with Benadryl       Amlodipine Other (See Comments)    Made pt overly sleepy stated "she could not function"   Codeine Other (See  Comments)    "Drives me crazy"   Cymbalta [Duloxetine Hcl] Diarrhea   Rosuvastatin     Other Reaction(s): myalgia   Trileptal [Oxcarbazepine]     Low Sodium    Amoxicillin Nausea And Vomiting and Rash    Other Reaction(s): rash, vomiting   Nitrofurantoin Rash and Itching    Other Reaction(s): Hives, Hives    HOME MEDICATIONS: Outpatient Medications Prior  to Visit  Medication Sig Dispense Refill   acetaminophen (TYLENOL) 500 MG tablet Take 1,000 mg by mouth every 6 (six) hours as needed for moderate pain.     b complex vitamins capsule Take 1 capsule by mouth daily.     calcium carbonate (OS-CAL) 1250 (500 Ca) MG chewable tablet Chew 1 tablet by mouth daily.     CRANBERRY PO Take 3 tablets by mouth in the morning and at bedtime.     diphenhydrAMINE (BENADRYL) 50 MG capsule Take 1 hour before contrast media injection. 1 capsule 0   famotidine (PEPCID) 20 MG tablet Take 20 mg by mouth daily. (Patient not taking: Reported on 06/24/2023)     gabapentin (NEURONTIN) 300 MG capsule Take 1 capsule by mouth in the morning, lunch and take 4 capsules at bedtime. 180 capsule 0   Glucosamine-Chondroit-Vit C-Mn (GLUCOSAMINE CHONDR 1500 COMPLX) CAPS Take 1 capsule by mouth daily. 1500/1200 tablet daily     hydroxychloroquine (PLAQUENIL) 200 MG tablet Take 200 mg by mouth 2 (two) times daily.     levothyroxine (SYNTHROID) 25 MCG tablet Take 25 mcg by mouth. Take 1 tablet daily, on MWF and saturday     loratadine (CLARITIN) 10 MG tablet Take 10 mg by mouth daily as needed for allergies.      methocarbamol (ROBAXIN) 500 MG tablet Take 1 tablet (500 mg total) by mouth 4 (four) times daily. (Patient taking differently: Take 500 mg by mouth as needed for muscle spasms.) 45 tablet 0   Multiple Vitamins-Minerals (CENTRUM SILVER 50+WOMEN PO) Take 1 tablet by mouth daily.     naproxen sodium (ALEVE) 220 MG tablet Take 440 mg by mouth daily as needed (pain).     nebivolol (BYSTOLIC) 5 MG tablet Take 1 tablet (5 mg  total) by mouth in the morning and at bedtime. 180 tablet 3   Polyethyl Glyc-Propyl Glyc PF (SYSTANE ULTRA PF) 0.4-0.3 % SOLN Place 1 drop into both eyes in the morning.     predniSONE (DELTASONE) 50 MG tablet Take 1 tablet by mouth at 13 hours, 7 hours and 1 hour before contrast media injection. 3 tablet 0   No facility-administered medications prior to visit.    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   Hypertension    Hypothyroidism    Obesity    PONV (postoperative nausea and vomiting)    Reflux    buring and dysphagia sx resp to PPI; egd 3/06 neg for Barretts   Tachycardia     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   Yeast infection    hx of     PAST SURGICAL HISTORY: Past Surgical History:  Procedure Laterality Date   AXILLARY LYMPH NODE BIOPSY Left 12/27/2015   Procedure: LEFT AXILLARY SENTINEL LYMPH NODE BIOPSY;  Surgeon: Ovidio Kin, MD;  Location: [facility];  Service: General;  Laterality: Left;  BELPHAROPTOSIS REPAIR Bilateral    BREAST LUMPECTOMY WITH RADIOACTIVE SEED LOCALIZATION Left 12/01/2015   Procedure: LEFT BREAST BIOPSYWITH RADIOACTIVE SEED LOCALIZATION;  Surgeon: Ovidio Kin, MD;  Location: [facility] OR;  Service: General;  Laterality: Left;   BREAST SURGERY     CATARACT EXTRACTION, BILATERAL Bilateral 08/2018   COLONOSCOPY  01/2005   NEG   cortisone injections     right arm; back   EYE SURGERY     cosmetic; eyelid surgery bilat    FINGER SURGERY Left     left index finger/ cyst 02/2007   HARDWARE REMOVAL N/A 06/13/2022   Procedure: Bilateral Thoracic Ten- Removal of Screws;  Surgeon: Donalee Citrin, MD;  Location: [facility] OR;  Service: Neurosurgery;  Laterality: N/A;   KNEE ARTHROSCOPY Right 04/13/2015   Procedure: ARTHROSCOPY RIGHT KNEE WITH MEDIAL MENISCAL DEBRIDEMENT AND CHONDROPLASTY;  Surgeon: Ollen Gross, MD;  Location: [facility];  Service: Orthopedics;  Laterality: Right;   KYPHOPLASTY N/A 03/16/2020   Procedure: KYPHOPLASTY THORACIC THREE, THORACIC FOUR, THORACIC SIX;  Surgeon: Shirlean Kelly, MD;  Location: [facility] OR;  Service: Neurosurgery;  Laterality: N/A;   KYPHOPLASTY N/A 06/13/2022   Procedure: Vertobroplasty Thoracic Ten;  Surgeon: Donalee Citrin, MD;  Location: [facility] OR;  Service: Neurosurgery;  Laterality: N/A;   LAMINECTOMY WITH POSTERIOR LATERAL ARTHRODESIS LEVEL 4 N/A 12/20/2021   Procedure: Thoracic Ten-Eleven, Thoracic Eleven-Twelve, Thoracic Twelve-Lumbar One, Lumbar One-Two Posterior Lateral Fusion;  Surgeon: Donalee Citrin, MD;  Location: [facility] OR;  Service: Neurosurgery;  Laterality: N/A;   left rotator cuff repair Left    12/2006   LUMBAR FUSION     x3   LUMBAR LAMINECTOMY     removal of screws  08/2022   TONSILLECTOMY      FAMILY HISTORY: Family History  Problem Relation Age of Onset   Diabetes Mother    Breast cancer Mother        dx. late 32s-early 58s; "metastasis to throat"?   Hodgkin's lymphoma Father 64   Breast cancer Sister        maternal half-sister dx. w/ DCIS in her late 57s;   Other Sister        learning disabilities; lives on her own   Diabetes Sister    Alcohol abuse Brother    Breast cancer Maternal Aunt        dx. 50s   Ovarian cancer Maternal Aunt 59   Breast cancer Paternal Aunt        dx. early 27s   Heart Problems Paternal Aunt    Stroke Maternal Grandfather    Depression Paternal Grandmother    Heart Problems Paternal Grandmother    Skin cancer Daughter        dx. early 43s   Other Daughter         insterstitial cystitis   Neuropathy Neg Hx     SOCIAL HISTORY: Social History   Socioeconomic History   Marital status: Married    Spouse name: Joe   Number of children: 2   Years of education: 13   Highest education level: Not on file  Occupational History   Occupation: retired  Tobacco Use   Smoking status: Never   Smokeless tobacco: Never  Vaping Use   Vaping status: Never Used  Substance and Sexual Activity   Alcohol use: No   Drug use: Not Currently    Comment: CBD- last time -10/2021   Sexual activity: Yes  Other Topics Concern   Not on file  Social History Narrative  Patient is right handed.   Patient drinks 2-3 cups of caffeine per day.   Social Determinants of Health   Financial Resource Strain: Not on file  Food Insecurity: Not on file  Transportation Needs: Not on file  Physical Activity: Not on file  Stress: Not on file  Social Connections: Not on file  Intimate Partner Violence: Not on file      PHYSICAL EXAM  Vitals:   10/07/23 1306  BP: 122/78  Pulse: 84  SpO2: 97%  Weight: 167 lb (75.8 kg)  Height: 4\' 11"  (1.499 m)     Body mass index is 33.73 kg/m.  Generalized: Well developed, in no acute distress   Neurological examination  Mentation: Alert oriented to time, place, history taking. Follows all commands speech and language fluent Cranial nerve II-XII: Pupils were equal round reactive to light. Extraocular movements were full, visual field were full on confrontational test. Facial sensation and strength were normal. Head turning and shoulder shrug  were normal and symmetric. Motor: The motor testing reveals 5 over 5 strength of all 4 extremities. Good symmetric motor tone is noted throughout.  Sensory: Sensory testing is intact to soft touch on all 4 extremities.  Coordination: Cerebellar testing reveals good finger-nose-finger and heel-to-shin bilaterally.  Gait and station: uses [place] when ambulating.  Reflexes: Deep tendon  reflexes are symmetric and normal bilaterally.   DIAGNOSTIC DATA (LABS, IMAGING, TESTING) - I reviewed patient records, labs, notes, testing and imaging myself where available.  Lab Results  Component Value Date   WBC 7.9 02/18/2023   HGB 14.2 02/18/2023   HCT 41.1 02/18/2023   MCV 87.3 02/18/2023   PLT 383 02/18/2023      Component Value Date/Time   NA 133 (L) 06/24/2023 1205   NA 141 11/30/2016 1358   K 5.0 06/24/2023 1205   K 3.5 11/30/2016 1358   CL 92 (L) 06/24/2023 1205   CO2 27 06/24/2023 1205   CO2 29 11/30/2016 1358   GLUCOSE 74 06/24/2023 1205   GLUCOSE 98 02/18/2023 1120   GLUCOSE 103 11/30/2016 1358   BUN 18 06/24/2023 1205   BUN 14.2 11/30/2016 1358   CREATININE 0.98 06/24/2023 1205   CREATININE 0.84 10/11/2022 1357   CREATININE 0.8 11/30/2016 1358   CALCIUM 10.5 (H) 06/24/2023 1205   CALCIUM 9.5 11/30/2016 1358   PROT 7.0 02/18/2023 1120   PROT 7.1 11/30/2016 1358   ALBUMIN 3.9 02/18/2023 1120   ALBUMIN 4.0 11/30/2016 1358   AST 24 02/18/2023 1120   AST 20 10/11/2022 1357   AST 25 11/30/2016 1358   ALT 15 02/18/2023 1120   ALT 15 10/11/2022 1357   ALT 20 11/30/2016 1358   ALKPHOS 65 02/18/2023 1120   ALKPHOS 71 11/30/2016 1358   BILITOT 0.9 02/18/2023 1120   BILITOT 0.7 10/11/2022 1357   BILITOT 0.55 11/30/2016 1358   GFRNONAA >60 02/18/2023 1120   GFRNONAA >60 10/11/2022 1357   GFRAA >60 03/16/2020 0717      ASSESSMENT AND PLAN 82 y.o. year old female  has a past medical history of Allergy, Arthritis, Bladder infection, Breast cancer (HCC) (10/25/2015), Cancer (HCC), Complication of anesthesia, Diastolic dysfunction, Dyspnea, Fibromyalgia, GERD (gastroesophageal reflux disease), Hereditary and idiopathic peripheral neuropathy (02/10/2015), Hypertension, Hypothyroidism, Obesity, PONV (postoperative nausea and vomiting), Reflux, Tachycardia ( ), and Yeast infection. here with:  1.  Peripheral neuropathy 2. Right thoracic pain   -Continue  gabapentin 300 mg in the morning and noon  and 1200 mg at bedtime.  -  Advised if symptoms worsen or she develops new symptoms she should let us know -Follow-up in 8 months or sooner if needed   Butch Penny, MSN, NP-C 10/07/2023, 12:59 PM [facility] [address] [place], [place] [address] [phone]

## 2023-10-07 NOTE — Patient Instructions (Addendum)
Your Plan:  Continue gabapentin    Thank you for coming to see [place] at [facility]. I hope we have been able to provide you high quality care today.  You may receive a patient satisfaction survey over the next few weeks. We would appreciate your feedback and comments so that we may continue to improve ourselves and the health of our patients.

## 2023-11-30 NOTE — Telephone Encounter (Signed)
Pt archived in [email or web address].  Please advise if patient and/or provider wish to proceed with Prolia therpay.  

## 2024-01-15 DIAGNOSIS — R8290 Unspecified abnormal findings in urine: Secondary | ICD-10-CM | POA: Diagnosis not present

## 2024-01-16 ENCOUNTER — Ambulatory Visit: Payer: PPO | Attending: Cardiology | Admitting: Cardiology

## 2024-01-16 ENCOUNTER — Encounter: Payer: Self-pay | Admitting: Cardiology

## 2024-01-16 VITALS — BP 132/82 | HR 79 | Ht <= 58 in | Wt 158.6 lb

## 2024-01-16 DIAGNOSIS — I10 Essential (primary) hypertension: Secondary | ICD-10-CM

## 2024-01-16 DIAGNOSIS — I4711 Inappropriate sinus tachycardia, so stated: Secondary | ICD-10-CM | POA: Diagnosis not present

## 2024-01-16 NOTE — Progress Notes (Signed)
 Cardiology Note    Date:  01/16/2024   ID:  Audrey [name], Audrey [name] 1941/03/20, MRN [id number]  PCP:  Ollen Bowl, MD  Cardiologist:  Armanda Magic, MD   Chief Complaint  Patient presents with   New Patient (Initial Visit)    HTN, inappropriate sinus tachycardia    History of Present Illness:  Audrey [name] is a 83 y.o. female with a history of breast cancer, diastolic dysfunction, fibromyalgia, GERD, hypertension and inappropriate sinus tachycardia.  When I saw her in 2016 she was on beta-blocker therapy which was stopped and she was started on losartan for treatment of her hypertension due to the beta-blockers causing fatigue and sleepiness.  Off the beta-blocker she was not having any problems with tachycardia at that time and heart rate was in the 80s.  I saw her back 2024 she had had some problems with elevated BP that seemed to be related to steroid back injections. She was tried on amlodipine which caused her to be very sleepy.  She was started on Bystolic 5mg  daily which she seemed to tolerate well.  Her BP was up at that time and Bystolic was increased to 5mg  BID.    She is here today for followup and is doing well.  She denies any chest pain or pressure, SOB, DOE, PND, orthopnea, LE edema, dizziness, palpitations or syncope. She tells me that her BP was elevated at home and when she saw her PCP yesterday it was elevated with a DBP over .  Her spironolactone was stopped due to diarrhea.  She is compliant with her meds and is tolerating meds with no SE.    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   Hypertension    Hypothyroidism    Obesity    PONV (postoperative nausea and vomiting)    Reflux    buring and dysphagia sx resp to PPI; egd 3/06 neg for Barretts   Rheumatoid arthritis (HCC)    Tachycardia     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   Yeast infection    hx of     Past Surgical History:  Procedure Laterality Date   AXILLARY LYMPH NODE BIOPSY Left 12/27/2015   Procedure: LEFT AXILLARY SENTINEL LYMPH NODE BIOPSY;  Surgeon: Ovidio Kin, MD;  Location: [facility];  Service: General;  Laterality: Left;   BELPHAROPTOSIS REPAIR Bilateral    BREAST LUMPECTOMY WITH RADIOACTIVE SEED LOCALIZATION Left 12/01/2015   Procedure: LEFT BREAST BIOPSYWITH RADIOACTIVE SEED LOCALIZATION;  Surgeon: Ovidio Kin, MD;  Location: [facility] OR;  Service: General;  Laterality: Left;   BREAST SURGERY     CATARACT EXTRACTION, BILATERAL Bilateral 08/2018   COLONOSCOPY  01/2005   NEG   cortisone injections     right arm; back   EYE SURGERY     cosmetic; eyelid surgery bilat    FINGER SURGERY Left  left index finger/ cyst 02/2007   HARDWARE REMOVAL N/A 06/13/2022   Procedure: Bilateral Thoracic Ten- Removal of Screws;  Surgeon: Donalee Citrin, MD;  Location: [facility] OR;  Service: Neurosurgery;  Laterality: N/A;   KNEE ARTHROSCOPY Right 04/13/2015   Procedure: ARTHROSCOPY RIGHT KNEE WITH MEDIAL MENISCAL DEBRIDEMENT AND CHONDROPLASTY;  Surgeon: Ollen Gross, MD;  Location: [facility];  Service: Orthopedics;  Laterality: Right;   KYPHOPLASTY N/A 03/16/2020   Procedure: KYPHOPLASTY THORACIC THREE, THORACIC FOUR, THORACIC SIX;  Surgeon: Shirlean Kelly, MD;  Location: [facility] OR;  Service: Neurosurgery;  Laterality: N/A;   KYPHOPLASTY N/A 06/13/2022   Procedure: Vertobroplasty Thoracic Ten;  Surgeon: Donalee Citrin, MD;  Location: [facility] OR;  Service: Neurosurgery;  Laterality: N/A;   LAMINECTOMY WITH  POSTERIOR LATERAL ARTHRODESIS LEVEL 4 N/A 12/20/2021   Procedure: Thoracic Ten-Eleven, Thoracic Eleven-Twelve, Thoracic Twelve-Lumbar One, Lumbar One-Two Posterior Lateral Fusion;  Surgeon: Donalee Citrin, MD;  Location: [facility] OR;  Service: Neurosurgery;  Laterality: N/A;   left rotator cuff repair Left    12/2006   LUMBAR FUSION     x3   LUMBAR LAMINECTOMY     removal of screws  08/2022   TONSILLECTOMY      Current Medications: Current Meds  Medication Sig   acetaminophen (TYLENOL) 500 MG tablet Take 1,000 mg by mouth every 6 (six) hours as needed for moderate pain.   b complex vitamins capsule Take 1 capsule by mouth daily.   calcium carbonate (OS-CAL) 1250 (500 Ca) MG chewable tablet Chew 1 tablet by mouth daily.   colestipol (COLESTID) 1 g tablet Take 1 g by mouth daily.   CRANBERRY PO Take 3 tablets by mouth daily at 6 (six) AM.   diphenhydrAMINE (BENADRYL) 50 MG capsule Take 1 hour before contrast media injection.   gabapentin (NEURONTIN) 300 MG capsule Take 1 capsule by mouth in the morning, lunch and take 4 capsules at bedtime.   Glucosamine-Chondroit-Vit C-Mn (GLUCOSAMINE CHONDR 1500 COMPLX) CAPS Take 1 capsule by mouth daily. 1500/1200 tablet daily   levothyroxine (SYNTHROID) 25 MCG tablet Take 25 mcg by mouth. Take 1 tablet daily, on MWF and saturday   loratadine (CLARITIN) 10 MG tablet Take 10 mg by mouth daily as needed for allergies.    methocarbamol (ROBAXIN) 500 MG tablet Take 1 tablet (500 mg total) by mouth 4 (four) times daily. (Patient taking differently: Take 500 mg by mouth as needed for muscle spasms.)   Multiple Vitamins-Minerals (CENTRUM SILVER 50+WOMEN PO) Take 1 tablet by mouth daily.   nebivolol (BYSTOLIC) 5 MG tablet Take 1 tablet (5 mg total) by mouth in the morning and at bedtime.   Polyethyl Glyc-Propyl Glyc PF (SYSTANE ULTRA PF) 0.4-0.3 % SOLN Place 1 drop into both eyes in the morning.   predniSONE (DELTASONE) 5 MG tablet Take 5 mg by mouth daily with breakfast.  For 2 more weeks   predniSONE (DELTASONE) 50 MG tablet Take 1 tablet by mouth at 13 hours, 7 hours and 1 hour before contrast media injection.   spironolactone (ALDACTONE) 25 MG tablet Take 25 mg by mouth daily.   [DISCONTINUED] hydroxychloroquine (PLAQUENIL) 200 MG tablet Take 200 mg by mouth 2 (two) times daily.   [DISCONTINUED] leflunomide (ARAVA) 20 MG tablet Take 20 mg by mouth daily.    Allergies:   Gadobenate, Iodinated contrast media, Other, Amlodipine, Codeine, Cymbalta [duloxetine hcl], Rosuvastatin, Trileptal [oxcarbazepine], Amoxicillin, and Nitrofurantoin   Social History   Socioeconomic History   Marital status: Married    Spouse name: Gabriel Rung  Number of children: 2   Years of education: 13   Highest education level: Not on file  Occupational History   Occupation: retired  Tobacco Use   Smoking status: Never   Smokeless tobacco: Never  Vaping Use   Vaping status: Never Used  Substance and Sexual Activity   Alcohol use: No   Drug use: Not Currently    Comment: CBD- last time -10/2021   Sexual activity: Yes  Other Topics Concern   Not on file  Social History Narrative   Patient is right handed.   Patient drinks 2-3 cups of caffeine per week   Social Drivers of Corporate investment banker Strain: Not on file  Food Insecurity: Not on file  Transportation Needs: Not on file  Physical Activity: Not on file  Stress: Not on file  Social Connections: Not on file     Family History:  The patient's family history includes Alcohol abuse in her brother; Breast cancer in her maternal aunt, mother, paternal aunt, and sister; Depression in her paternal grandmother; Diabetes in her mother and sister; Heart Problems in her paternal aunt and paternal grandmother; Hodgkin's lymphoma (age of onset: 23) in her father; Other in her daughter and sister; Ovarian cancer (age of onset: 63) in her maternal aunt; Skin cancer in her daughter; Stroke in her maternal grandfather.   ROS:    Please see the history of present illness.    ROS All other systems reviewed and are negative.      No data to display             PHYSICAL EXAM:   VS:  BP 132/82   Pulse 79   Ht 4\' 10"  (1.473 m)   Wt 158 lb 9.6 oz (71.9 kg)   SpO2 97%   BMI 33.15 kg/m    GEN: Well nourished, well developed in no acute distress HEENT: Normal NECK: No JVD; No carotid bruits LYMPHATICS: No lymphadenopathy CARDIAC:RRR, no murmurs, rubs, gallops RESPIRATORY:  Clear to auscultation without rales, wheezing or rhonchi  ABDOMEN: Soft, non-tender, non-distended MUSCULOSKELETAL:  No edema; No deformity  SKIN: Warm and dry NEUROLOGIC:  Alert and oriented x 3 PSYCHIATRIC:  Normal affect  Wt Readings from Last 3 Encounters:  01/16/24 158 lb 9.6 oz (71.9 kg)  10/07/23 167 lb (75.8 kg)  06/24/23 164 lb 9.6 oz (74.7 kg)      Studies/Labs Reviewed:    Recent Labs: 02/18/2023: ALT 15; Hemoglobin 14.2; Platelets 383 06/24/2023: BUN 18; Creatinine, Ser 0.98; Magnesium 1.7; Potassium 5.0; Sodium 133   Lipid Panel No results found for: "CHOL", "TRIG", "HDL", "CHOLHDL", "VLDL", "LDLCALC", "LDLDIRECT"   Additional studies/ records that were reviewed today include:  none    ASSESSMENT:    1. Inappropriate sinus tachycardia (HCC)   2. Essential hypertension, benign       PLAN:  In order of problems listed above:  Inappropriate sinus tachycardia -HR well controlled on Bystolic -continue prescription drug management with Bystolic 5mg  BID with PRN refills  Hypertension -BP controlled on exam today -recently had Lisinopril added due to increased BP at PCP office but she has not picked it up yet -continue prescription drug management with Bystolic 5mg  BID and start Lisinopril   Time Spent: 20 minutes total time of encounter, including 15 minutes spent in face-to-face patient care on the date of this encounter. This time includes coordination of care and counseling regarding above  mentioned problem list. Remainder of non-face-to-face time involved reviewing chart documents/testing  relevant to the patient encounter and documentation in the medical record. I have independently reviewed documentation from referring provider  Medication Adjustments/Labs and Tests Ordered: Current medicines are reviewed at length with the patient today.  Concerns regarding medicines are outlined above.  Medication changes, Labs and Tests ordered today are listed in the Patient Instructions below.  There are no Patient Instructions on file for this visit.   Signed, Armanda Magic, MD  01/16/2024 1:12 PM    [facility] [address] [place], [place], [place]  [address] Phone: [phone]; Fax: [phone]

## 2024-01-16 NOTE — Patient Instructions (Signed)
Medication Instructions:  Your physician recommends that you continue on your current medications as directed. Please refer to the Current Medication list given to you today.  *If you need a refill on your cardiac medications before your next appointment, please call your pharmacy*  Follow-Up: At [facility], you and your health needs are our priority.  As part of our continuing mission to provide you with exceptional heart care, we have created designated Provider Care Teams.  These Care Teams include your primary Cardiologist (physician) and Advanced Practice Providers (APPs -  Physician Assistants and Nurse Practitioners) who all work together to provide you with the care you need, when you need it.  Your next appointment:   1 year(s)  The format for your next appointment:   In Person  Provider:   Traci Turner, MD  

## 2024-01-28 DIAGNOSIS — R197 Diarrhea, unspecified: Secondary | ICD-10-CM | POA: Diagnosis not present

## 2024-01-28 DIAGNOSIS — K52832 Lymphocytic colitis: Secondary | ICD-10-CM | POA: Diagnosis not present

## 2024-01-28 DIAGNOSIS — K5730 Diverticulosis of large intestine without perforation or abscess without bleeding: Secondary | ICD-10-CM | POA: Diagnosis not present

## 2024-01-30 DIAGNOSIS — I10 Essential (primary) hypertension: Secondary | ICD-10-CM | POA: Diagnosis not present

## 2024-01-30 DIAGNOSIS — R790 Abnormal level of blood mineral: Secondary | ICD-10-CM | POA: Diagnosis not present

## 2024-02-03 ENCOUNTER — Other Ambulatory Visit: Payer: Self-pay | Admitting: Neurology

## 2024-02-04 DIAGNOSIS — K5289 Other specified noninfective gastroenteritis and colitis: Secondary | ICD-10-CM | POA: Diagnosis not present

## 2024-02-05 DIAGNOSIS — H5340 Unspecified visual field defects: Secondary | ICD-10-CM | POA: Diagnosis not present

## 2024-02-14 DIAGNOSIS — I10 Essential (primary) hypertension: Secondary | ICD-10-CM | POA: Diagnosis not present

## 2024-02-20 DIAGNOSIS — I10 Essential (primary) hypertension: Secondary | ICD-10-CM | POA: Diagnosis not present

## 2024-03-06 DIAGNOSIS — E7800 Pure hypercholesterolemia, unspecified: Secondary | ICD-10-CM | POA: Diagnosis not present

## 2024-03-06 DIAGNOSIS — K5289 Other specified noninfective gastroenteritis and colitis: Secondary | ICD-10-CM | POA: Diagnosis not present

## 2024-03-06 DIAGNOSIS — M059 Rheumatoid arthritis with rheumatoid factor, unspecified: Secondary | ICD-10-CM | POA: Diagnosis not present

## 2024-03-06 DIAGNOSIS — I4719 Other supraventricular tachycardia: Secondary | ICD-10-CM | POA: Diagnosis not present

## 2024-03-06 DIAGNOSIS — M810 Age-related osteoporosis without current pathological fracture: Secondary | ICD-10-CM | POA: Diagnosis not present

## 2024-03-06 DIAGNOSIS — I700 Atherosclerosis of aorta: Secondary | ICD-10-CM | POA: Diagnosis not present

## 2024-03-06 DIAGNOSIS — M797 Fibromyalgia: Secondary | ICD-10-CM | POA: Diagnosis not present

## 2024-03-06 DIAGNOSIS — M19011 Primary osteoarthritis, right shoulder: Secondary | ICD-10-CM | POA: Diagnosis not present

## 2024-03-06 DIAGNOSIS — E039 Hypothyroidism, unspecified: Secondary | ICD-10-CM | POA: Diagnosis not present

## 2024-03-06 DIAGNOSIS — Z853 Personal history of malignant neoplasm of breast: Secondary | ICD-10-CM | POA: Diagnosis not present

## 2024-03-06 DIAGNOSIS — I10 Essential (primary) hypertension: Secondary | ICD-10-CM | POA: Diagnosis not present

## 2024-03-19 DIAGNOSIS — Z1231 Encounter for screening mammogram for malignant neoplasm of breast: Secondary | ICD-10-CM | POA: Diagnosis not present

## 2024-04-09 DIAGNOSIS — H532 Diplopia: Secondary | ICD-10-CM | POA: Diagnosis not present

## 2024-04-21 ENCOUNTER — Telehealth: Payer: Self-pay

## 2024-04-21 NOTE — Telephone Encounter (Signed)
 Audrey [name]

## 2024-04-21 NOTE — Telephone Encounter (Signed)
 Spoke with patient. She accepted sooner appt with Dr Samara Crest on 6/3 at 345 pm arrive 315. Patient will keep the f/u in July w/ Jacqlyn Matas NP for neuropathy. Her questions were answered and she thanked me for the call.

## 2024-04-28 ENCOUNTER — Encounter: Payer: Self-pay | Admitting: Neurology

## 2024-04-28 ENCOUNTER — Ambulatory Visit: Admitting: Neurology

## 2024-04-28 VITALS — BP 153/84 | HR 75 | Ht <= 58 in | Wt 157.0 lb

## 2024-04-28 DIAGNOSIS — H532 Diplopia: Secondary | ICD-10-CM | POA: Diagnosis not present

## 2024-04-28 NOTE — Patient Instructions (Signed)
 Check myasthenia gravis labs with TSH, I will contact you to go over the results. 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   Continue to follow your doctors  Return as needed.

## 2024-04-28 NOTE — Progress Notes (Signed)
 [facility]  PATIENT: Audrey [name] DOB: [birth year]  REQUESTING CLINICIAN: Pahwani, Regino Caprio, MD HISTORY FROM: Patient  REASON FOR VISIT: Double vision    HISTORICAL  CHIEF COMPLAINT:  Chief Complaint  Patient presents with   Myasthenia Gravis    RM 13. CONSULT 30 - work up for MG per Dr. Joanne Muckle. Pt reports double vision. Pt reports no other issues with MG besides vision. Pt reports aching all over everywhere.     INTERVAL HISTORY 04/28/2024:  Patient presents today at the request of ophthalmologist to rule out myasthenia gravis due to ongoing diplopia.  I first met her back in June 2024 where she was complaining of double vision, described as images side-by-side.  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  She did follow-up with a ophthalmologist who has put her on fresnel prism  glasses and her double vision resolved, she did well until a few weeks ago when the double vision returned.  Again same description, two images side-by-side, when she closes 1 of eye her vision clears.  She currently is using glasses with Fresnel prism  with resolution of the diplopia.  She was referred here to rule out myasthenia as she does complain of double vision worse in the afternoon and evening.  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  Again no headaches, no loss of vision, no other neurological deficit.   HISTORY OF PRESENT ILLNESS:  This is a 83 year old woman past medical history of hypothyroidism, hypertension, heart disease, peripheral neuropathy who is presenting with double vision.  Patient reports episode of double vision that occurred suddenly on Tuesday, May 28.  She did not think anything of it then she had another episode Friday, May 31.  She described episode as seeing 2 images side-by-side.   When she closes one eye, her vision will be back to normal.  Over the weekend it got worse and on Monday she went to her ophthalmologist.  She was given a Fresnel prism  eyeglasses on the right which corrected her vision.  Patient reports when she removes her glasses she might see double but once looking with her glasses her vision is back to normal.  She denies any headaches, denies any pain with chewing, no pain with brushing her hair, denies any focal neurological deficit and no other complaints.    OTHER MEDICAL CONDITIONS: Hypothyroidism, Hypertension, Heart disease, peripheral neuropathy    REVIEW OF SYSTEMS: Full 14 system review of systems performed and negative with exception of: As noted in the HPI   ALLERGIES: Allergies  Allergen Reactions   Gadobenate Anaphylaxis and Shortness Of Breath   Iodinated Contrast Media Shortness Of Breath    Other Reaction(s): SOB and Nausea   Other Swelling    Cat gut sutures Cats and dogs                Derma bond- intolerance- itching- tolerates with Benadryl        Amlodipine Other (See Comments)    Made pt overly sleepy stated "she could not function"   Codeine Other (See Comments)    "Drives me crazy"   Cymbalta [Duloxetine Hcl] Diarrhea   Rosuvastatin     Other Reaction(s): myalgia   Trileptal  [Oxcarbazepine ]     Low Sodium    Amoxicillin Nausea And Vomiting and Rash  Other Reaction(s): rash, vomiting   Nitrofurantoin Rash and Itching    Other Reaction(s): Hives, Hives    HOME MEDICATIONS: Outpatient Medications Prior to Visit  Medication Sig Dispense Refill   acetaminophen  (TYLENOL ) 500 MG tablet Take 1,000 mg by mouth every 6 (six) hours as needed for moderate pain.     b complex vitamins capsule Take 1 capsule by mouth daily.     budesonide (ENTOCORT EC) 3 MG 24 hr capsule Take 3 mg by mouth in the morning, at noon, and at bedtime.     calcium  carbonate (OS-CAL) 1250 (500 Ca) MG chewable tablet Chew 1 tablet by mouth daily.      carvedilol (COREG) 25 MG tablet Take 25 mg by mouth 2 (two) times daily with a meal.     CRANBERRY PO Take 3 tablets by mouth daily at 6 (six) AM.     diphenhydrAMINE  (BENADRYL ) 50 MG capsule Take 1 hour before contrast media injection. 1 capsule 0   gabapentin  (NEURONTIN ) 300 MG capsule TAKE 1 CAPSULE BY MOUTH IN THE MORNING, LUNCH AND TAKE 4 CAPSULES AT BEDTIME. 180 capsule 0   Glucosamine-Chondroit-Vit C-Mn (GLUCOSAMINE CHONDR 1500 COMPLX) CAPS Take 1 capsule by mouth daily. 1500/1200 tablet daily     levothyroxine  (SYNTHROID ) 25 MCG tablet Take 25 mcg by mouth. Take 1 tablet daily, on MWF and saturday     lisinopril (ZESTRIL) 20 MG tablet Take 20 mg by mouth daily.     loratadine  (CLARITIN ) 10 MG tablet Take 10 mg by mouth daily as needed for allergies.      methocarbamol  (ROBAXIN ) 500 MG tablet Take 1 tablet (500 mg total) by mouth 4 (four) times daily. (Patient taking differently: Take 500 mg by mouth as needed for muscle spasms.) 45 tablet 0   Multiple Vitamins-Minerals (CENTRUM SILVER 50+WOMEN PO) Take 1 tablet by mouth daily.     Polyethyl Glyc-Propyl Glyc PF (SYSTANE ULTRA PF) 0.4-0.3 % SOLN Place 1 drop into both eyes in the morning.     predniSONE  (DELTASONE ) 5 MG tablet Take 50 mg by mouth daily with breakfast. For 2 more weeks     colestipol (COLESTID) 1 g tablet Take 1 g by mouth daily. (Patient not taking: Reported on 04/28/2024)     nebivolol  (BYSTOLIC ) 5 MG tablet Take 1 tablet (5 mg total) by mouth in the morning and at bedtime. (Patient not taking: Reported on 04/28/2024) 180 tablet 3   predniSONE  (DELTASONE ) 50 MG tablet Take 1 tablet by mouth at 13 hours, 7 hours and 1 hour before contrast media injection. (Patient not taking: Reported on 04/28/2024) 3 tablet 0   No facility-administered medications prior to visit.    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   Hypertension    Hypothyroidism    Obesity    PONV (postoperative nausea and vomiting)    Reflux    buring and dysphagia sx  resp to PPI; egd 3/06 neg for Barretts   Rheumatoid arthritis (HCC)    Tachycardia     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   Yeast infection    hx of     PAST SURGICAL HISTORY: Past Surgical History:  Procedure Laterality Date   AXILLARY LYMPH NODE BIOPSY Left 12/27/2015   Procedure: LEFT AXILLARY SENTINEL LYMPH NODE BIOPSY;  Surgeon: Juanita Norlander, MD;  Location: [facility];  Service: General;  Laterality: Left;   BELPHAROPTOSIS REPAIR Bilateral    BREAST LUMPECTOMY WITH RADIOACTIVE SEED LOCALIZATION Left 12/01/2015   Procedure: LEFT BREAST BIOPSYWITH RADIOACTIVE SEED LOCALIZATION;  Surgeon: Juanita Norlander, MD;  Location: [facility] OR;  Service: General;  Laterality: Left;   BREAST SURGERY     CATARACT EXTRACTION, BILATERAL Bilateral 08/2018   COLONOSCOPY  01/2005   NEG   cortisone injections     right arm; back   EYE SURGERY     cosmetic; eyelid surgery bilat    FINGER SURGERY Left    left index finger/ cyst 02/2007   HARDWARE REMOVAL N/A 06/13/2022   Procedure: Bilateral Thoracic Ten- Removal of Screws;  Surgeon: Gearl Keens, MD;  Location: [facility] OR;  Service: Neurosurgery;  Laterality: N/A;   KNEE ARTHROSCOPY Right 04/13/2015   Procedure: ARTHROSCOPY RIGHT KNEE WITH MEDIAL MENISCAL DEBRIDEMENT AND CHONDROPLASTY;  Surgeon: Liliane Rei, MD;  Location: [facility];  Service: Orthopedics;  Laterality: Right;   KYPHOPLASTY N/A 03/16/2020   Procedure: KYPHOPLASTY THORACIC THREE,  THORACIC FOUR, THORACIC SIX;  Surgeon: Yvonna Herder, MD;  Location: [facility] OR;  Service: Neurosurgery;  Laterality: N/A;   KYPHOPLASTY N/A 06/13/2022   Procedure: Vertobroplasty Thoracic Ten;  Surgeon: Gearl Keens, MD;  Location: [facility] OR;  Service: Neurosurgery;  Laterality: N/A;   LAMINECTOMY WITH POSTERIOR LATERAL ARTHRODESIS LEVEL 4 N/A 12/20/2021   Procedure: Thoracic Ten-Eleven, Thoracic Eleven-Twelve, Thoracic Twelve-Lumbar One, Lumbar One-Two Posterior Lateral Fusion;  Surgeon: Gearl Keens, MD;  Location: [facility] OR;  Service: Neurosurgery;  Laterality: N/A;   left rotator cuff repair Left    12/2006   LUMBAR FUSION     x3   LUMBAR LAMINECTOMY     removal of screws  08/2022   TONSILLECTOMY      FAMILY HISTORY: Family History  Problem Relation Age of Onset   Diabetes Mother    Breast cancer Mother        dx. late 30s-early 12s; "metastasis to throat"?   Hodgkin's lymphoma Father 98   Breast cancer Sister        maternal half-sister dx. w/ DCIS in her late 35s;   Other Sister        learning disabilities; lives on her own   Diabetes Sister    Alcohol  abuse Brother    Breast cancer Maternal Aunt        dx. 50s   Ovarian cancer Maternal Aunt 59   Breast cancer Paternal Aunt        dx. early 39s   Heart Problems Paternal Aunt    Stroke Maternal Grandfather    Depression Paternal Grandmother    Heart Problems Paternal Grandmother    Skin cancer Daughter        dx. early 40s   Other Daughter        insterstitial cystitis   Neuropathy Neg Hx     SOCIAL HISTORY: Social History   Socioeconomic History   Marital status: Married    Spouse name: Joe   Number of  children: 2   Years of education: 13   Highest education level: Not on file  Occupational History   Occupation: retired  Tobacco Use   Smoking status: Never   Smokeless tobacco: Never  Vaping Use   Vaping status: Never Used  Substance and Sexual Activity   Alcohol  use: No   Drug use: Not Currently    Comment: CBD-  last time -10/2021   Sexual activity: Yes  Other Topics Concern   Not on file  Social History Narrative   Patient is right handed.   Patient drinks 2-3 cups of caffeine per week   Social Drivers of Corporate investment banker Strain: Not on file  Food Insecurity: Not on file  Transportation Needs: Not on file  Physical Activity: Not on file  Stress: Not on file  Social Connections: Not on file  Intimate Partner Violence: Not on file    PHYSICAL EXAM  GENERAL EXAM/CONSTITUTIONAL: Vitals:  Vitals:   04/28/24 1543  BP: (!) 153/84  Pulse: 75  Weight: 157 lb (71.2 kg)  Height: 4\' 10"  (1.473 m)   Body mass index is 32.81 kg/m. Wt Readings from Last 3 Encounters:  04/28/24 157 lb (71.2 kg)  01/16/24 158 lb 9.6 oz (71.9 kg)  10/07/23 167 lb (75.8 kg)   Patient is in no distress; well developed, nourished and groomed; neck is supple  MUSCULOSKELETAL: Gait, strength, tone, movements noted in Neurologic exam below  NEUROLOGIC: MENTAL STATUS:      No data to display         awake, alert, oriented to person, place and time recent and remote memory intact normal attention and concentration language fluent, comprehension intact, naming intact fund of knowledge appropriate  CRANIAL NERVE:  2nd, 3rd, 4th, 6th - Visual fields full to confrontation, extraocular muscles intact, no nystagmus, no double vision today 5th - facial sensation symmetric 7th - facial strength symmetric 8th - hearing intact 9th - palate elevates symmetrically, uvula midline 11th - shoulder shrug symmetric 12th - tongue protrusion midline  MOTOR:  normal bulk and tone, full strength in the BUE, BLE  SENSORY:  normal and symmetric to light touch  COORDINATION:  finger-nose-finger, fine finger movements normal  GAIT/STATION:  Antalgic, ambulate with a cane    DIAGNOSTIC DATA (LABS, IMAGING, TESTING) - I reviewed patient records, labs, notes, testing and imaging myself where  available.  Lab Results  Component Value Date   WBC 7.9 02/18/2023   HGB 14.2 02/18/2023   HCT 41.1 02/18/2023   MCV 87.3 02/18/2023   PLT 383 02/18/2023      Component Value Date/Time   NA 133 (L) 06/24/2023 1205   NA 141 11/30/2016 1358   K 5.0 06/24/2023 1205   K 3.5 11/30/2016 1358   CL 92 (L) 06/24/2023 1205   CO2 27 06/24/2023 1205   CO2 29 11/30/2016 1358   GLUCOSE 74 06/24/2023 1205   GLUCOSE 98 02/18/2023 1120   GLUCOSE 103 11/30/2016 1358   BUN 18 06/24/2023 1205   BUN 14.2 11/30/2016 1358   CREATININE 0.98 06/24/2023 1205   CREATININE 0.84 10/11/2022 1357   CREATININE 0.8 11/30/2016 1358   CALCIUM  10.5 (H) 06/24/2023 1205   CALCIUM  9.5 11/30/2016 1358   PROT 7.0 02/18/2023 1120   PROT 7.1 11/30/2016 1358   ALBUMIN  3.9 02/18/2023 1120   ALBUMIN  4.0 11/30/2016 1358   AST 24 02/18/2023 1120   AST 20 10/11/2022 1357   AST 25 11/30/2016 1358   ALT  15 02/18/2023 1120   ALT 15 10/11/2022 1357   ALT 20 11/30/2016 1358   ALKPHOS 65 02/18/2023 1120   ALKPHOS 71 11/30/2016 1358   BILITOT 0.9 02/18/2023 1120   BILITOT 0.7 10/11/2022 1357   BILITOT 0.55 11/30/2016 1358   GFRNONAA >60 02/18/2023 1120   GFRNONAA >60 10/11/2022 1357   GFRAA >60 03/16/2020 0717   No results found for: "CHOL", "HDL", "LDLCALC", "LDLDIRECT", "TRIG", "CHOLHDL" No results found for: "HGBA1C" Lab Results  Component Value Date   VITAMINB12 >2000 (H) 02/02/2015   No results found for: "TSH"  MRI Brain 05/04/2023 There is a 9 x 5 mm extra-axial dural based mass on the left anterior to the upper pons.  There is mild involvement of the posterior cavernous sinus and this finding could be related to her diplopia.  This most likely represents a meningioma.  Consider MRI with contrast to further evaluate if necessary.  The mass can also be seen on the CT scan from March 2024.  In retrospect there is slight thickening of the meninges in that region on MRI from 07/31/2018 though definite mass is not  appreciated. Moderately severe white matter changes most likely due to chronic microvascular ischemic change.  Could also be due to a genetic or acquired leukodystrophy. Mild generalized cortical atrophy, fairly stable compared to 2019. No acute findings.   MRI Brain 09/17/2023 Unchanged putative 1.4 cm meningioma, detailed above.   ASSESSMENT AND PLAN  83 y.o. year old female with history of hypertension, hyperlipidemia, hypothyroidism, peripheral neuropathy who is presenting with complaints of diplopia.  Diplopia resolves with Fresnel prism .  Due to ongoing symptoms, worse in the afternoon, I will obtain a myasthenia gravis labs with TSH.  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  If the myasthenia gravis lab workup come back negative, then patient diplopia might be related to the meningioma, currently her symptoms are controlled with the Fresnel prism  but if there is worsening of her symptoms, we will discuss possibility of surgical evaluation.  I will contact you to go over the lab results.    1. Diplopia      Patient Instructions  Check myasthenia gravis labs with TSH, I will contact you to go over the results. 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   Continue to follow your doctors  Return as needed.  Orders Placed This Encounter  Procedures   Acetylcholine receptor, binding   Thyroid  Panel With TSH    No orders of the defined types were placed in this encounter.   Return if symptoms worsen or fail to improve.    Cassandra Cleveland, MD 04/28/2024, 5:14 PM  [facility] [address], [address] [place], [place] [address] [phone]

## 2024-04-29 LAB — THYROID PANEL WITH TSH
Free Thyroxine Index: 2.2 (ref 1.2–4.9)
T3 Uptake Ratio: 32 % (ref 24–39)
T4, Total: 7 ug/dL (ref 4.5–12.0)
TSH: 2.12 u[IU]/mL (ref 0.450–4.500)

## 2024-04-29 LAB — ACETYLCHOLINE RECEPTOR, BINDING: AChR Binding Ab, Serum: <0.07 nmol/L (ref 0.00–0.24)

## 2024-04-30 ENCOUNTER — Emergency Department (HOSPITAL_COMMUNITY)

## 2024-04-30 ENCOUNTER — Emergency Department (HOSPITAL_COMMUNITY)
Admission: EM | Admit: 2024-04-30 | Discharge: 2024-04-30 | Disposition: A | Discharge status: Home / Self Care | Attending: Emergency Medicine | Admitting: Emergency Medicine

## 2024-04-30 ENCOUNTER — Encounter (HOSPITAL_COMMUNITY): Payer: Self-pay | Admitting: Emergency Medicine

## 2024-04-30 ENCOUNTER — Other Ambulatory Visit: Payer: Self-pay

## 2024-04-30 DIAGNOSIS — R231 Pallor: Secondary | ICD-10-CM | POA: Diagnosis not present

## 2024-04-30 DIAGNOSIS — S069X9A Unspecified intracranial injury with loss of consciousness of unspecified duration, initial encounter: Secondary | ICD-10-CM | POA: Diagnosis not present

## 2024-04-30 DIAGNOSIS — M50222 Other cervical disc displacement at C5-C6 level: Secondary | ICD-10-CM | POA: Diagnosis not present

## 2024-04-30 DIAGNOSIS — R55 Syncope and collapse: Secondary | ICD-10-CM | POA: Insufficient documentation

## 2024-04-30 DIAGNOSIS — W1830XA Fall on same level, unspecified, initial encounter: Secondary | ICD-10-CM | POA: Insufficient documentation

## 2024-04-30 DIAGNOSIS — S0181XA Laceration without foreign body of other part of head, initial encounter: Secondary | ICD-10-CM | POA: Diagnosis not present

## 2024-04-30 DIAGNOSIS — S0990XA Unspecified injury of head, initial encounter: Secondary | ICD-10-CM | POA: Diagnosis present

## 2024-04-30 DIAGNOSIS — I959 Hypotension, unspecified: Secondary | ICD-10-CM | POA: Diagnosis not present

## 2024-04-30 DIAGNOSIS — S199XXA Unspecified injury of neck, initial encounter: Secondary | ICD-10-CM | POA: Diagnosis not present

## 2024-04-30 DIAGNOSIS — M25511 Pain in right shoulder: Secondary | ICD-10-CM | POA: Diagnosis not present

## 2024-04-30 DIAGNOSIS — Z23 Encounter for immunization: Secondary | ICD-10-CM | POA: Insufficient documentation

## 2024-04-30 DIAGNOSIS — M50322 Other cervical disc degeneration at C5-C6 level: Secondary | ICD-10-CM | POA: Diagnosis not present

## 2024-04-30 DIAGNOSIS — M19011 Primary osteoarthritis, right shoulder: Secondary | ICD-10-CM | POA: Diagnosis not present

## 2024-04-30 DIAGNOSIS — W19XXXA Unspecified fall, initial encounter: Secondary | ICD-10-CM | POA: Diagnosis not present

## 2024-04-30 DIAGNOSIS — R58 Hemorrhage, not elsewhere classified: Secondary | ICD-10-CM | POA: Diagnosis not present

## 2024-04-30 DIAGNOSIS — M4854XA Collapsed vertebra, not elsewhere classified, thoracic region, initial encounter for fracture: Secondary | ICD-10-CM | POA: Diagnosis not present

## 2024-04-30 LAB — COMPREHENSIVE METABOLIC PANEL WITH GFR
ALT: 13 U/L (ref 0–44)
AST: 20 U/L (ref 15–41)
Albumin: 3.2 g/dL — ABNORMAL LOW (ref 3.5–5.0)
Alkaline Phosphatase: 44 U/L (ref 38–126)
Anion gap: 11 (ref 5–15)
BUN: 19 mg/dL (ref 8–23)
CO2: 26 mmol/L (ref 22–32)
Calcium: 9.5 mg/dL (ref 8.9–10.3)
Chloride: 102 mmol/L (ref 98–111)
Creatinine, Ser: 1.27 mg/dL — ABNORMAL HIGH (ref 0.44–1.00)
GFR, Estimated: 43 mL/min — ABNORMAL LOW (ref >60)
Glucose, Bld: 111 mg/dL — ABNORMAL HIGH (ref 70–99)
Potassium: 3.3 mmol/L — ABNORMAL LOW (ref 3.5–5.1)
Sodium: 139 mmol/L (ref 135–145)
Total Bilirubin: 0.7 mg/dL (ref 0.0–1.2)
Total Protein: 6.2 g/dL — ABNORMAL LOW (ref 6.5–8.1)

## 2024-04-30 LAB — CBC WITH DIFFERENTIAL/PLATELET
Abs Immature Granulocytes: 0.04 10*3/uL (ref 0.00–0.07)
Basophils Absolute: 0 10*3/uL (ref 0.0–0.1)
Basophils Relative: 0 %
Eosinophils Absolute: 0 10*3/uL (ref 0.0–0.5)
Eosinophils Relative: 0 %
HCT: 39.5 % (ref 36.0–46.0)
Hemoglobin: 13.6 g/dL (ref 12.0–15.0)
Immature Granulocytes: 0 %
Lymphocytes Relative: 21 %
Lymphs Abs: 2.2 10*3/uL (ref 0.7–4.0)
MCH: 32.8 pg (ref 26.0–34.0)
MCHC: 34.4 g/dL (ref 30.0–36.0)
MCV: 95.2 fL (ref 80.0–100.0)
Monocytes Absolute: 0.9 10*3/uL (ref 0.1–1.0)
Monocytes Relative: 8 %
Neutro Abs: 7.5 10*3/uL (ref 1.7–7.7)
Neutrophils Relative %: 71 %
Platelets: 291 10*3/uL (ref 150–400)
RBC: 4.15 MIL/uL (ref 3.87–5.11)
RDW: 13.2 % (ref 11.5–15.5)
WBC: 10.7 10*3/uL — ABNORMAL HIGH (ref 4.0–10.5)
nRBC: 0 % (ref 0.0–0.2)

## 2024-04-30 LAB — TROPONIN I (HIGH SENSITIVITY): Troponin I (High Sensitivity): 5 ng/L (ref <18)

## 2024-04-30 MED ORDER — TETANUS-DIPHTH-ACELL PERTUSSIS 5-2.5-18.5 LF-MCG/0.5 IM SUSY
0.5000 mL | PREFILLED_SYRINGE | Freq: Once | INTRAMUSCULAR | Status: AC
  Administered 2024-04-30: 0.5 mL via INTRAMUSCULAR
  Filled 2024-04-30: qty 0.5

## 2024-04-30 MED ORDER — LIDOCAINE-EPINEPHRINE (PF) 2 %-1:200000 IJ SOLN
10.0000 mL | Freq: Once | INTRAMUSCULAR | Status: AC
  Administered 2024-04-30: 10 mL via INTRADERMAL
  Filled 2024-04-30: qty 20

## 2024-04-30 MED ORDER — SODIUM CHLORIDE 0.9 % IV BOLUS
1000.0000 mL | Freq: Once | INTRAVENOUS | Status: AC
  Administered 2024-04-30: 1000 mL via INTRAVENOUS

## 2024-04-30 NOTE — ED Provider Notes (Signed)
 [place] EMERGENCY DEPARTMENT AT [facility] Provider Note   CSN: [id number] Arrival date & time: 04/30/24  1502     History  Chief Complaint  Patient presents with   Loss of Consciousness    Audrey [name] is a 83 y.o. female.  83 yo F with a chief complaints of fall.  Patient had gotten up to go to the bathroom and she felt not quite right.  She felt maybe dizzy.  She was able to go to the bathroom and got up and that sensation got worse.  She thought if she hold onto the wall that she should be able to make it back to where she had started.  She then woke up on the ground.  Had struck her head.  Denies other injury.  She reportedly passed out again when family went to go see her.  Then she passed out with EMS when she tried to sit up on the stretcher.  She felt like before that event she was fine.  She denies any chest pain difficulty breathing headache or neck pain before the event.  Denies abdominal pain nausea vomiting or diarrhea.  Feels like has been eating and drinking normally.  Had lunch just prior to this event.  She has been having some diplopia which has been an off-and-on problem for her for some time.  Has been going on this time for about 3 weeks.  Did not notice any significant issue with it today.   Loss of Consciousness      Home Medications Prior to Admission medications   Medication Sig Start Date End Date Taking? Authorizing Provider  acetaminophen  (TYLENOL ) 500 MG tablet Take 1,000 mg by mouth every 6 (six) hours as needed for moderate pain.    [provider]  b complex vitamins capsule Take 1 capsule by mouth daily.    [provider]  budesonide (ENTOCORT EC) 3 MG 24 hr capsule Take 3 mg by mouth in the morning, at noon, and at bedtime.    [provider]  calcium  carbonate (OS-CAL) 1250 (500 Ca) MG chewable tablet Chew 1 tablet by mouth daily.    [provider]  carvedilol (COREG) 25 MG tablet Take 25 mg  by mouth 2 (two) times daily with a meal.    [provider]  colestipol (COLESTID) 1 g tablet Take 1 g by mouth daily. Patient not taking: Reported on 04/28/2024 01/15/24   [provider]  CRANBERRY PO Take 3 tablets by mouth daily at 6 (six) AM.    [provider]  diphenhydrAMINE  (BENADRYL ) 50 MG capsule Take 1 hour before contrast media injection. 05/16/23   Cassandra Cleveland, MD  gabapentin  (NEURONTIN ) 300 MG capsule TAKE 1 CAPSULE BY MOUTH IN THE MORNING, LUNCH AND TAKE 4 CAPSULES AT BEDTIME. 02/03/24   Camara, Amadou, MD  Glucosamine-Chondroit-Vit C-Mn (GLUCOSAMINE CHONDR 1500 COMPLX) CAPS Take 1 capsule by mouth daily. 1500/1200 tablet daily    [provider]  levothyroxine  (SYNTHROID ) 25 MCG tablet Take 25 mcg by mouth. Take 1 tablet daily, on MWF and saturday    [provider]  lisinopril (ZESTRIL) 20 MG tablet Take 20 mg by mouth daily.    [provider]  loratadine  (CLARITIN ) 10 MG tablet Take 10 mg by mouth daily as needed for allergies.     [provider]  methocarbamol  (ROBAXIN ) 500 MG tablet Take 1 tablet (500 mg total) by mouth 4 (four) times daily. Patient taking differently: Take  500 mg by mouth as needed for muscle spasms. 12/21/21   Meyran, Kenard Paul, NP  Multiple Vitamins-Minerals (CENTRUM SILVER 50+WOMEN PO) Take 1 tablet by mouth daily.    [provider]  nebivolol  (BYSTOLIC ) 5 MG tablet Take 1 tablet (5 mg total) by mouth in the morning and at bedtime. Patient not taking: Reported on 04/28/2024 01/18/23   Jacqueline Matsu, MD  Polyethyl Glyc-Propyl Glyc PF (SYSTANE ULTRA PF) 0.4-0.3 % SOLN Place 1 drop into both eyes in the morning.    [provider]  predniSONE  (DELTASONE ) 5 MG tablet Take 50 mg by mouth daily with breakfast. For 2 more weeks    [provider]  predniSONE  (DELTASONE ) 50 MG tablet Take 1 tablet by mouth at 13 hours, 7 hours and 1 hour before contrast media  injection. Patient not taking: Reported on 04/28/2024 05/16/23   Camara, Amadou, MD      Allergies    Gadobenate, Iodinated contrast media, Other, Amlodipine, Codeine, Cymbalta [duloxetine hcl], Rosuvastatin, Trileptal  [oxcarbazepine ], Amoxicillin, and Nitrofurantoin    Review of Systems   Review of Systems  Cardiovascular:  Positive for syncope.    Physical Exam Updated Vital Signs BP (!) 159/87   Pulse 69   Temp 98.4 F (36.9 C)   Resp 14   Wt 72 kg   SpO2 100%   BMI 33.17 kg/m  Physical Exam Vitals and nursing note reviewed.  Constitutional:      General: She is not in acute distress.    Appearance: She is well-developed. She is not diaphoretic.  HENT:     Head: Normocephalic.     Comments: Left frontal laceration approximately 3 and half centimeters.  Gaping. Eyes:     Pupils: Pupils are equal, round, and reactive to light.  Cardiovascular:     Rate and Rhythm: Normal rate and regular rhythm.     Heart sounds: No murmur heard.    No friction rub. No gallop.  Pulmonary:     Effort: Pulmonary effort is normal.     Breath sounds: No wheezing or rales.  Abdominal:     General: There is no distension.     Palpations: Abdomen is soft.     Tenderness: There is no abdominal tenderness.  Musculoskeletal:        General: No tenderness.     Cervical back: Normal range of motion and neck supple.     Comments: Patient tells me she has a new tremor in her right upper arm.  Has pain with range of motion of the right shoulder.  Trapezius muscle tenderness and spasm.  No obvious midline spinal tenderness develops or deformities.  Able to rotate her head 45 degrees in either direction without discomfort.  Palpated from head to toe without any other obvious noted areas of bony tenderness.  Skin:    General: Skin is warm and dry.  Neurological:     Mental Status: She is alert and oriented to person, place, and time.  Psychiatric:        Behavior: Behavior normal.     ED Results  / Procedures / Treatments   Labs (all labs ordered are listed, but only abnormal results are displayed) Labs Reviewed  CBC WITH DIFFERENTIAL/PLATELET - Abnormal; Notable for the following components:      Result Value   WBC 10.7 (*)    All other components within normal limits  COMPREHENSIVE METABOLIC PANEL WITH GFR - Abnormal; Notable for the following components:   Potassium  3.3 (*)    Glucose, Bld 111 (*)    Creatinine, Ser 1.27 (*)    Total Protein 6.2 (*)    Albumin  3.2 (*)    GFR, Estimated 43 (*)    All other components within normal limits  TROPONIN I (HIGH SENSITIVITY)    EKG EKG Interpretation Date/Time:  Thursday April 30 2024 15:05:27 EDT Ventricular Rate:  68 PR Interval:  142 QRS Duration:  82 QT Interval:  386 QTC Calculation: 410 R Axis:   49  Text Interpretation: Normal sinus rhythm Normal ECG Baseline wander TECHNICALLY DIFFICULT flipped t waves in inferior and lateral leads seen on prior Otherwise no significant change Confirmed by Albertus Hughs [phone]) on 04/30/2024 3:15:06 PM  Radiology CT Cervical Spine Wo Contrast Result Date: 04/30/2024 CLINICAL DATA:  Neck trauma (Age >= 65y) Loss of consciousness, syncopal episodes. EXAM: CT CERVICAL SPINE WITHOUT CONTRAST TECHNIQUE: Multidetector CT imaging of the cervical spine was performed without intravenous contrast. Multiplanar CT image reconstructions were also generated. RADIATION DOSE REDUCTION: This exam was performed according to the departmental dose-optimization program which includes automated exposure control, adjustment of the mA and/or kV according to patient size and/or use of iterative reconstruction technique. COMPARISON:  None Available. FINDINGS: Alignment: No traumatic subluxation. Trace degenerative retrolisthesis of C5 on C6. mild broad-based dextroscoliotic curvature. Skull base and vertebrae: No acute fracture. Vertebral body heights are maintained. The dens and skull base are intact. Previous T3 and. T4  compression fracture with vertebral augmentation. Methylmethacrylate extends beyond the anterior aspect of the T3 vertebral body. Soft tissues and spinal canal: No prevertebral fluid or swelling. No visible canal hematoma. Disc levels: Multilevel degenerative disc disease is most prominent at C5-C6. Moderate multilevel facet hypertrophy. No high-grade canal stenosis. Upper chest: Branching hyperdensities in the upper lobes likely represent migrated methylmethacrylate. Acute findings. Other: None. IMPRESSION: 1. No acute fracture or subluxation of the cervical spine. 2. Multilevel degenerative disc disease and facet hypertrophy. 3. Previous T3 and T4 compression fracture post vertebral augmentation. Electronically Signed   By: Chadwick Colonel M.D.   On: 04/30/2024 16:24   CT Head Wo Contrast Result Date: 04/30/2024 CLINICAL DATA:  Head trauma, loss of consciousness. Syncopal episodes at home. EXAM: CT HEAD WITHOUT CONTRAST TECHNIQUE: Contiguous axial images were obtained from the base of the skull through the vertex without intravenous contrast. RADIATION DOSE REDUCTION: This exam was performed according to the departmental dose-optimization program which includes automated exposure control, adjustment of the mA and/or kV according to patient size and/or use of iterative reconstruction technique. COMPARISON:  Brain MRI 09/06/2023 FINDINGS: Brain: No acute intracranial hemorrhage. No subdural or extra-axial collection. 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 Stable degree of atrophy and chronic small vessel ischemic change. Remote lacunar infarct in the right basal ganglia versus perivascular space. No evidence of acute ischemia. Vascular: Atherosclerosis of skullbase vasculature without hyperdense vessel or abnormal calcification. Skull: No fracture or focal lesion. Sinuses/Orbits: No acute finding.  Bilateral cataract resection. Other: Mild  skin and scalp irregularity in the left frontal region. IMPRESSION: 1. No acute intracranial abnormality. No skull fracture. 2. Stable atrophy and chronic small vessel ischemic change. 3. 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 Electronically Signed   By: Chadwick Colonel M.D.   On: 04/30/2024 16:19   DG Shoulder Right Result Date: 04/30/2024 CLINICAL DATA:  Right shoulder pain after  fall. EXAM: RIGHT SHOULDER - 2+ VIEW COMPARISON:  None Available. FINDINGS: There is no evidence of fracture or dislocation. Severe degenerative changes seen involving the right acromioclavicular joint. Moderate degenerative changes seen involving the right glenohumeral joint. Soft tissues are unremarkable. 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 No acute abnormality seen. Electronically Signed   By: Rosalene Colon M.D.   On: 04/30/2024 16:03    Procedures .Laceration Repair  Date/Time: 04/30/2024 6:09 PM  Performed by: Albertus Hughs, DO Authorized by: Albertus Hughs, DO   Consent:    Consent obtained:  Verbal   Consent given by:  Patient   Risks, benefits, and alternatives were discussed: yes     Risks discussed:  Infection, pain, poor cosmetic result and poor wound healing   Alternatives discussed:  No treatment Universal protocol:    Procedure explained and questions answered to patient or proxy's satisfaction: yes     Immediately prior to procedure, a time out was called: yes     Patient identity confirmed:  Verbally with patient Anesthesia:    Anesthesia method:  Local infiltration   Local anesthetic:  Lidocaine  2% WITH epi Laceration details:    Location:  Face   Face location:  Forehead   Length (cm):  8.2 Pre-procedure details:    Preparation:  Patient was prepped and draped in usual sterile fashion Exploration:    Hemostasis achieved with:  Epinephrine  and direct pressure   Imaging obtained  comment:  CT   Imaging outcome: foreign body not noted     Wound exploration: entire depth of wound visualized     Wound extent: no underlying fracture   Treatment:    Area cleansed with:  Chlorhexidine    Amount of cleaning:  Standard   Irrigation solution:  Sterile saline   Irrigation volume:  50   Irrigation method:  Pressure wash   Debridement:  None   Undermining:  None   Scar revision: no   Skin repair:    Repair method:  Sutures   Suture size:  5-0   Suture material:  Fast-absorbing gut   Suture technique:  Simple interrupted   Number of sutures:  7 Approximation:    Approximation:  Close Repair type:    Repair type:  Simple Post-procedure details:    Dressing:  Antibiotic ointment and adhesive bandage   Procedure completion:  Tolerated well, no immediate complications     Medications Ordered in ED Medications  lidocaine -EPINEPHrine  (XYLOCAINE  W/EPI) 2 %-1:200000 (PF) injection 10 mL (10 mLs Intradermal Given 04/30/24 1605)  Tdap (BOOSTRIX) injection 0.5 mL (0.5 mLs Intramuscular Given 04/30/24 1606)  sodium chloride  0.9 % bolus 1,000 mL (0 mLs Intravenous Stopped 04/30/24 1711)    ED Course/ Medical Decision Making/ A&P                                 Medical Decision Making Amount and/or Complexity of Data Reviewed Labs: ordered. Radiology: ordered.  Risk Prescription drug management.   83 yo F with a chief complaints of a syncopal event.  Could be vasovagal by history.  Patient felt bad got up to go to the bathroom and then when she was walking back from the bathroom ended up collapsing.  Had a cluster of about 3 events.  Feeling better now.  Will obtain laboratory evaluation bolus of IV fluids CT head C-spine.  Plain film right shoulder.  Reassess.  Plain  film of the right shoulder independently interpreted by me without fracture.  CT of the head without obvious intracranial hemorrhage.  CT of C-spine without obvious acute fracture.  Patient is feeling  better on repeat assessment.  She has a mild bump in her creatinine.  No significant electrolyte abnormalities.  She had initially requested a magnesium  level to be drawn, as I was discussing logistics she will defer at this time and will follow-up with her doctor in the office.  6:11 PM:  I have discussed the diagnosis/risks/treatment options with the patient and family.  Evaluation and diagnostic testing in the emergency department does not suggest an emergent condition requiring admission or immediate intervention beyond what has been performed at this time.  They will follow up with PCP. We also discussed returning to the ED immediately if new or worsening sx occur. We discussed the sx which are most concerning (e.g., sudden worsening pain, fever, inability to tolerate by mouth) that necessitate immediate return. Medications administered to the patient during their visit and any new prescriptions provided to the patient are listed below.  Medications given during this visit Medications  lidocaine -EPINEPHrine  (XYLOCAINE  W/EPI) 2 %-1:200000 (PF) injection 10 mL (10 mLs Intradermal Given 04/30/24 1605)  Tdap (BOOSTRIX) injection 0.5 mL (0.5 mLs Intramuscular Given 04/30/24 1606)  sodium chloride  0.9 % bolus 1,000 mL (0 mLs Intravenous Stopped 04/30/24 1711)     The patient appears reasonably screen and/or stabilized for discharge and I doubt any other medical condition or other [facility] requiring further screening, evaluation, or treatment in the ED at this time prior to discharge.          Final Clinical Impression(s) / ED Diagnoses Final diagnoses:  Syncope and collapse  Laceration of forehead, initial encounter    Rx / DC Orders ED Discharge Orders     None         Albertus Hughs, DO 04/30/24 1811

## 2024-04-30 NOTE — Discharge Instructions (Signed)
 Follow up with your family doc in the office.  Let them know about your visit to the ED. they may want to change your medications for perhaps any to the cardiologist.  You looked a bit dehydrated on your blood work.  Try to eat and drink as well as you can for the next couple days.  Return for redness drainage or if you get a fever.  The sutures that were used are dissolvable that should dissolve between day 3 and day 5.  If they are still there after day 7 then you can gently plucked them out with tweezers.  The area can get wet but not fully immersed underwater.  No scrubbing.  If you really want to clean it you can apply a half-and-half hydrogen peroxide solution with water on a Q-tip.  You can apply an ointment a couple times a day this could be as simple as Vaseline but could also be an antibiotic ointment if you wish.  Once it is healed please try to avoid prolonged sun exposure use sunscreen.  Gells that have silicone antigens have been shown to reduce scarring in some research.

## 2024-04-30 NOTE — ED Notes (Signed)
 Patient to CT.

## 2024-04-30 NOTE — ED Triage Notes (Signed)
 Patient BIB [facility] c/o multiple syncopal episodes at home and with EMS.  At home, she fell forward during one episode, striking her forehead.  EMS VITALS 126/84 P 70 RR 18 96% RA RR 18 CBG 122 22 R FA

## 2024-05-01 ENCOUNTER — Other Ambulatory Visit: Payer: Self-pay | Admitting: Neurology

## 2024-05-04 DIAGNOSIS — N179 Acute kidney failure, unspecified: Secondary | ICD-10-CM | POA: Diagnosis not present

## 2024-05-04 DIAGNOSIS — Z9181 History of falling: Secondary | ICD-10-CM | POA: Diagnosis not present

## 2024-05-04 DIAGNOSIS — K52832 Lymphocytic colitis: Secondary | ICD-10-CM | POA: Diagnosis not present

## 2024-05-04 DIAGNOSIS — D72828 Other elevated white blood cell count: Secondary | ICD-10-CM | POA: Diagnosis not present

## 2024-05-04 DIAGNOSIS — S0990XD Unspecified injury of head, subsequent encounter: Secondary | ICD-10-CM | POA: Diagnosis not present

## 2024-05-04 DIAGNOSIS — R55 Syncope and collapse: Secondary | ICD-10-CM | POA: Diagnosis not present

## 2024-05-04 DIAGNOSIS — E876 Hypokalemia: Secondary | ICD-10-CM | POA: Diagnosis not present

## 2024-05-04 DIAGNOSIS — H532 Diplopia: Secondary | ICD-10-CM | POA: Diagnosis not present

## 2024-05-04 DIAGNOSIS — D329 Benign neoplasm of meninges, unspecified: Secondary | ICD-10-CM | POA: Diagnosis not present

## 2024-05-06 DIAGNOSIS — H5000 Unspecified esotropia: Secondary | ICD-10-CM | POA: Diagnosis not present

## 2024-05-06 DIAGNOSIS — H26492 Other secondary cataract, left eye: Secondary | ICD-10-CM | POA: Diagnosis not present

## 2024-05-07 ENCOUNTER — Ambulatory Visit: Payer: Self-pay | Admitting: Neurology

## 2024-05-11 DIAGNOSIS — K5289 Other specified noninfective gastroenteritis and colitis: Secondary | ICD-10-CM | POA: Diagnosis not present

## 2024-05-25 DIAGNOSIS — E7800 Pure hypercholesterolemia, unspecified: Secondary | ICD-10-CM | POA: Diagnosis not present

## 2024-05-25 DIAGNOSIS — Z853 Personal history of malignant neoplasm of breast: Secondary | ICD-10-CM | POA: Diagnosis not present

## 2024-05-25 DIAGNOSIS — I10 Essential (primary) hypertension: Secondary | ICD-10-CM | POA: Diagnosis not present

## 2024-05-25 DIAGNOSIS — M059 Rheumatoid arthritis with rheumatoid factor, unspecified: Secondary | ICD-10-CM | POA: Diagnosis not present

## 2024-05-27 DIAGNOSIS — H26492 Other secondary cataract, left eye: Secondary | ICD-10-CM | POA: Diagnosis not present

## 2024-05-27 DIAGNOSIS — R55 Syncope and collapse: Secondary | ICD-10-CM | POA: Diagnosis not present

## 2024-05-28 DIAGNOSIS — R55 Syncope and collapse: Secondary | ICD-10-CM | POA: Diagnosis not present

## 2024-06-01 ENCOUNTER — Ambulatory Visit: Payer: PPO | Admitting: Adult Health

## 2024-06-01 VITALS — BP 127/77 | HR 70 | Ht <= 58 in | Wt 157.0 lb

## 2024-06-01 DIAGNOSIS — R55 Syncope and collapse: Secondary | ICD-10-CM

## 2024-06-01 DIAGNOSIS — H532 Diplopia: Secondary | ICD-10-CM

## 2024-06-01 DIAGNOSIS — G629 Polyneuropathy, unspecified: Secondary | ICD-10-CM | POA: Diagnosis not present

## 2024-06-01 NOTE — Progress Notes (Unsigned)
 PATIENT: Audrey [name] DOB: 14-Jun-1941  REASON FOR VISIT: follow up HISTORY FROM: patient  Chief Complaint  Patient presents with   Follow-up    RM19--neuropathy-alone Pt stated--double vision, weakness lower b/l legs, and getting worse.     HISTORY OF PRESENT ILLNESS: Today 06/01/24:  Audrey [name] is a 83 y.o. female with a history of neuropathy. Returns today for follow-up.  She is here today for follow-up for her neuropathy.  She states overall it has remained relatively stable.  She has decreased her gabapentin  dose to 300 in the morning and 900 at bedtime.  She states that she reduced this because of dizziness during the day.  She states that she did have a syncopal event 4 weeks ago.  She states that she was in her home and moving down the hallway.  The only thing she remembers is waking up on the floor with a gash on her head.  Granddaughter was there to assist her.  Prior to this event she had a fall 3 to 4 months prior but does not remember losing consciousness.  Reports that her PCP sent over a referral to workup syncope.  She has an appointment in October with Dr. Gregg.  She reports that she was supposed to repeat MRI of the brain but this was put on hold due to kidney function.  She sees a kidney specialist this week and if she gets clearance she will schedule the scan.  Has saw Dr. Gregg twice for diplopia.  Blood work was negative for myasthenia gravis.  She states that her ophthalmologist feels that her diplopia is neurological.  Patient states that when she had diplopia in the past it only lasted for a week.  This time is lasting for 3 months.  She states that if she she blinks and wets her eyes the diplopia will resolve for a while.  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 Onetha.    10/07/23: Audrey [name] is a 83 y.o. female with a history of peripheral neuropathy. Returns today for follow-up.  She remains on gabapentin  taking 300 mg in the  morning, at noon and 4 tablets at bedtime.  She states that this overall controls her discomfort.  She states that she still has pain in the right thoracic region.  She has followed up with Dr. Onetha but nothing further they can do at this time.  She states that it has gotten better but not completely resolved.  Denies any significant changes with her gait or balance.  She states that she did have a fall yesterday.  States that she was out in her yard and tripped over a Vine.  She did hit her forehead but fortunately no significant bruising.  She does have a minor scrape.  Not on blood thinners.  Reports that she has recently been diagnosed with rheumatoid arthritis.   03/04/23: Audrey [name] is a 83 y.o. female with a history of peripheral neuropathy. Returns today for follow-up.  Patient states that her neuropathy is under relatively good control.  She does have pain across the center of the right foot and under the toes.  She also reports some joint pain in the right hand and shoulder.  Her primary care has referred her to rheumatology as her ANA and sedimentation rate was elevated.  . Balance is worse- has had 3 back surgeries last one in July 2023. Uses a cane. No recent falls. Still feels like she wants  to lean forward.   Right thoracic pain- not stabbing pain but still uncomfortable. Doing dry needling and it seems to help. Has had ESI but it doesn't last- longest it last is 2.5 weeks. Trileptal  made her Na low- had to go to the hospital. Na has slowly gotten back to normal.    04/12/21: Audrey [name] is a 83 year old female with a history of peripheral neuropathy.  She returns today for follow-up.  She reports that she has noticed more discomfort at nighttime.  She states that she does not have pain but describes it as itching.  In the feet.  She states that the cream helps but it takes a while before she notices the benefit.  Reports that she continues to have numbness in the feet.  States that her  feet feel cold but are warm to the touch.  She states that she takes 1200 mg of gabapentin  about 30 minutes before bedtime she tried to reduce to 900 mg but her discomfort increased.  She continues to take 300 mg in the morning denies any discomfort throughout the day just at bedtime.  Patient also reports that she has been having pain in the upper thighs in the groin region that radiates to the inner part of the thighs.  She states that she also has pain down the lateral aspect of both thighs.  The discomfort comes and goes.  She followed up with Dr. Onetha and had an MRI of the back and hips.  I have not seen these images.  She reports that he told her there was no further work-up.  She also saw Dr. Melodi and was told that it was spinal stenosis and advised that she should follow-up with her back surgeon.  I have not reviewed the notes from either doctors.  The patient is asking for my opinion.  10/11/20: Audrey [name] is a 83 year old female with a history of peripheral neuropathy.  She returns today for follow-up.  She states that there are some nights she is unable to sleep due to her neuropathy.  She states that most the time it feels as if her feet are itching.  This has always been how her neuropathy presented.  She does not have any significant pain associated with her neuropathy.  She reports that she adjusted her gabapentin  and some nights will take 4 to 5 tablets at bedtime.  She reports that the increase in gabapentin  was helpful.  She is currently taking gabapentin  300 mg in the morning and 1200 to 1500 mg at bedtime.  She denies any side effects from gabapentin .  Denies any changes with her gait or balance.  She does report back in March she tripped and fractured her neck and had to have surgery.  Since then she is not had any additional falls.  She returns today for an evaluation.  HISTORY 07/09/19:   Audrey [name] is a 83 year old female with a history of peripheral neuropathy.  She returns today  for follow-up.  She continues to take gabapentin  300 mg in the morning and at noon and 600 mg at bedtime.  She states that this continues to work well for her.  Over the last 6 months she has had 3 episodes where she will have a exacerbation of pain in her feet.  She describes it as a sharp shooting pain that can last for a while.  She states when this does happen she will take an extra gabapentin  and it resolves the pain.  She denies  any significant changes with her gait or balance.  She states that her balance is not as good as it was because she is not able to go to yoga class to the pandemic.  She denies any new symptoms.  She returns today for an evaluation.  REVIEW OF SYSTEMS: Out of a complete 14 system review of symptoms, the patient complains only of the following symptoms, and all other reviewed systems are negative.  See HPI  ALLERGIES: Allergies  Allergen Reactions   Gadobenate Anaphylaxis and Shortness Of Breath   Iodinated Contrast Media Shortness Of Breath    Other Reaction(s): SOB and Nausea   Other Swelling    Cat gut sutures Cats and dogs                Derma bond- intolerance- itching- tolerates with Benadryl        Amlodipine Other (See Comments)    Made pt overly sleepy stated she could not function   Codeine Other (See Comments)    Drives me crazy   Cymbalta [Duloxetine Hcl] Diarrhea   Rosuvastatin     Other Reaction(s): myalgia   Trileptal  [Oxcarbazepine ]     Low Sodium    Amoxicillin Nausea And Vomiting and Rash    Other Reaction(s): rash, vomiting   Nitrofurantoin Rash and Itching    Other Reaction(s): Hives, Hives    HOME MEDICATIONS: Outpatient Medications Prior to Visit  Medication Sig Dispense Refill   acetaminophen  (TYLENOL ) 500 MG tablet Take 1,000 mg by mouth every 6 (six) hours as needed for moderate pain.     b complex vitamins capsule Take 1 capsule by mouth daily.     budesonide (ENTOCORT EC) 3 MG 24 hr capsule Take 3 mg by mouth in the  morning, at noon, and at bedtime.     calcium  carbonate (OS-CAL) 1250 (500 Ca) MG chewable tablet Chew 1 tablet by mouth daily.     carvedilol (COREG) 25 MG tablet Take 25 mg by mouth 2 (two) times daily with a meal.     CRANBERRY PO Take 3 tablets by mouth daily at 6 (six) AM.     diphenhydrAMINE  (BENADRYL ) 50 MG capsule Take 1 hour before contrast media injection. 1 capsule 0   gabapentin  (NEURONTIN ) 300 MG capsule TAKE 1 CAPSULE BY MOUTH IN THE MORNING, LUNCH AND TAKE 4 CAPSULES AT BEDTIME. 180 capsule 0   Glucosamine-Chondroit-Vit C-Mn (GLUCOSAMINE CHONDR 1500 COMPLX) CAPS Take 1 capsule by mouth daily. 1500/1200 tablet daily     levothyroxine  (SYNTHROID ) 25 MCG tablet Take 25 mcg by mouth. Take 1 tablet daily, on MWF and saturday     lisinopril (ZESTRIL) 20 MG tablet Take 20 mg by mouth daily.     loratadine  (CLARITIN ) 10 MG tablet Take 10 mg by mouth daily as needed for allergies.      methocarbamol  (ROBAXIN ) 500 MG tablet Take 1 tablet (500 mg total) by mouth 4 (four) times daily. 45 tablet 0   Multiple Vitamins-Minerals (CENTRUM SILVER 50+WOMEN PO) Take 1 tablet by mouth daily.     nebivolol  (BYSTOLIC ) 5 MG tablet Take 1 tablet (5 mg total) by mouth in the morning and at bedtime. 180 tablet 3   Polyethyl Glyc-Propyl Glyc PF (SYSTANE ULTRA PF) 0.4-0.3 % SOLN Place 1 drop into both eyes in the morning.     colestipol (COLESTID) 1 g tablet Take 1 g by mouth daily. (Patient not taking: Reported on 04/28/2024)     predniSONE  (DELTASONE ) 5 MG tablet Take 50  mg by mouth daily with breakfast. For 2 more weeks     predniSONE  (DELTASONE ) 50 MG tablet Take 1 tablet by mouth at 13 hours, 7 hours and 1 hour before contrast media injection. (Patient not taking: Reported on 04/28/2024) 3 tablet 0   No facility-administered medications prior to visit.    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   Hypertension    Hypothyroidism    Obesity    PONV (postoperative nausea and vomiting)    Reflux    buring and dysphagia sx resp to PPI; egd 3/06 neg for Barretts   Rheumatoid arthritis (HCC)    Tachycardia     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   Yeast infection    hx of     PAST SURGICAL HISTORY: Past Surgical History:  Procedure Laterality Date   AXILLARY LYMPH NODE BIOPSY Left 12/27/2015   Procedure: LEFT AXILLARY SENTINEL LYMPH NODE BIOPSY;  Surgeon: Alm Angle, MD;  Location: [facility];  Service: General;  Laterality: Left;   BELPHAROPTOSIS REPAIR Bilateral    BREAST LUMPECTOMY WITH RADIOACTIVE SEED LOCALIZATION Left 12/01/2015   Procedure: LEFT BREAST BIOPSYWITH RADIOACTIVE SEED LOCALIZATION;  Surgeon: Alm Angle, MD;  Location: [facility] OR;  Service: General;  Laterality: Left;   BREAST SURGERY     CATARACT EXTRACTION, BILATERAL Bilateral 08/2018   COLONOSCOPY  01/2005   NEG   cortisone injections     right arm; back   EYE SURGERY     cosmetic; eyelid surgery bilat    FINGER SURGERY Left    left index finger/ cyst 02/2007   HARDWARE REMOVAL N/A 06/13/2022   Procedure: Bilateral Thoracic Ten- Removal of Screws;  Surgeon: Onetha Kuba, MD;  Location[facility] OR;  Service: Neurosurgery;  Laterality: N/A;   KNEE ARTHROSCOPY Right 04/13/2015   Procedure: ARTHROSCOPY RIGHT KNEE WITH MEDIAL MENISCAL DEBRIDEMENT AND CHONDROPLASTY;  Surgeon: Dempsey Moan, MD;  Location: [facility];  Service: Orthopedics;  Laterality: Right;   KYPHOPLASTY N/A 03/16/2020   Procedure: KYPHOPLASTY THORACIC  THREE, THORACIC FOUR, THORACIC SIX;  Surgeon: Alix Charleston, MD;  Location: [facility] OR;  Service: Neurosurgery;  Laterality: N/A;   KYPHOPLASTY N/A 06/13/2022   Procedure: Vertobroplasty Thoracic Ten;  Surgeon: Onetha Kuba, MD;  Location: [facility] OR;  Service: Neurosurgery;  Laterality: N/A;   LAMINECTOMY WITH POSTERIOR LATERAL ARTHRODESIS LEVEL 4 N/A 12/20/2021   Procedure: Thoracic Ten-Eleven, Thoracic Eleven-Twelve, Thoracic Twelve-Lumbar One, Lumbar One-Two Posterior Lateral Fusion;  Surgeon: Onetha Kuba, MD;  Location: [facility] OR;  Service: Neurosurgery;  Laterality: N/A;   left rotator cuff repair Left    12/2006   LUMBAR FUSION     x3   LUMBAR LAMINECTOMY     removal of screws  08/2022   TONSILLECTOMY      FAMILY HISTORY: Family History  Problem Relation Age of Onset   Diabetes Mother  Breast cancer Mother        dx. late 71s-early 83s; metastasis to throat?   Hodgkin's lymphoma Father 5   Breast cancer Sister        maternal half-sister dx. w/ DCIS in her late 52s;   Other Sister        learning disabilities; lives on her own   Diabetes Sister    Alcohol  abuse Brother    Breast cancer Maternal Aunt        dx. 50s   Ovarian cancer Maternal Aunt 59   Breast cancer Paternal Aunt        dx. early 45s   Heart Problems Paternal Aunt    Stroke Maternal Grandfather    Depression Paternal Grandmother    Heart Problems Paternal Grandmother    Skin cancer Daughter        dx. early 17s   Other Daughter        insterstitial cystitis   Neuropathy Neg Hx     SOCIAL HISTORY: Social History   Socioeconomic History   Marital status: Married    Spouse name: Joe   Number of children: 2   Years of education: 13   Highest education level: Not on file  Occupational History   Occupation: retired  Tobacco Use   Smoking status: Never   Smokeless tobacco: Never  Vaping Use   Vaping status: Never Used  Substance and Sexual Activity   Alcohol  use: No   Drug use: Not Currently     Comment: CBD- last time -10/2021   Sexual activity: Yes  Other Topics Concern   Not on file  Social History Narrative   Patient is right handed.   Patient drinks 2-3 cups of caffeine per week   Social Drivers of Corporate investment banker Strain: Not on file  Food Insecurity: Not on file  Transportation Needs: Not on file  Physical Activity: Not on file  Stress: Not on file  Social Connections: Not on file  Intimate Partner Violence: Not on file      PHYSICAL EXAM  Vitals:   06/01/24 1309  BP: 127/77  Pulse: 70  Weight: 157 lb (71.2 kg)  Height: 4' 10 (1.473 m)      Body mass index is 32.81 kg/m.  Generalized: Well developed, in no acute distress   Neurological examination  Mentation: Alert oriented to time, place, history taking. Follows all commands speech and language fluent Cranial nerve II-XII: Pupils were equal round reactive to light. Extraocular movements were full, visual field were full on confrontational test. Facial sensation and strength were normal. Head turning and shoulder shrug  were normal and symmetric. Motor: The motor testing reveals 5 over 5 strength of all 4 extremities. Good symmetric motor tone is noted throughout.  Sensory: Sensory testing is intact to soft touch on all 4 extremities.  Coordination: Cerebellar testing reveals good finger-nose-finger and heel-to-shin bilaterally.  Gait and station: uses Simpson when ambulating.  Reflexes: Deep tendon reflexes are symmetric and normal bilaterally.   DIAGNOSTIC DATA (LABS, IMAGING, TESTING) - I reviewed patient records, labs, notes, testing and imaging myself where available.  Lab Results  Component Value Date   WBC 10.7 (H) 04/30/2024   HGB 13.6 04/30/2024   HCT 39.5 04/30/2024   MCV 95.2 04/30/2024   PLT 291 04/30/2024      Component Value Date/Time   NA 139 04/30/2024 1603   NA 133 (L) 06/24/2023 1205   NA 141 11/30/2016 1358  K 3.3 (L) 04/30/2024 1603   K 3.5 11/30/2016 1358    CL 102 04/30/2024 1603   CO2 26 04/30/2024 1603   CO2 29 11/30/2016 1358   GLUCOSE 111 (H) 04/30/2024 1603   GLUCOSE 103 11/30/2016 1358   BUN 19 04/30/2024 1603   BUN 18 06/24/2023 1205   BUN 14.2 11/30/2016 1358   CREATININE 1.27 (H) 04/30/2024 1603   CREATININE 0.84 10/11/2022 1357   CREATININE 0.8 11/30/2016 1358   CALCIUM  9.5 04/30/2024 1603   CALCIUM  9.5 11/30/2016 1358   PROT 6.2 (L) 04/30/2024 1603   PROT 7.1 11/30/2016 1358   ALBUMIN  3.2 (L) 04/30/2024 1603   ALBUMIN  4.0 11/30/2016 1358   AST 20 04/30/2024 1603   AST 20 10/11/2022 1357   AST 25 11/30/2016 1358   ALT 13 04/30/2024 1603   ALT 15 10/11/2022 1357   ALT 20 11/30/2016 1358   ALKPHOS 44 04/30/2024 1603   ALKPHOS 71 11/30/2016 1358   BILITOT 0.7 04/30/2024 1603   BILITOT 0.7 10/11/2022 1357   BILITOT 0.55 11/30/2016 1358   GFRNONAA 43 (L) 04/30/2024 1603   GFRNONAA >60 10/11/2022 1357   GFRAA >60 03/16/2020 0717      ASSESSMENT AND PLAN 83 y.o. year old female  has a past medical history of Allergy, Arthritis, Bladder infection, Breast cancer (HCC) (10/25/2015), Cancer (HCC), Complication of anesthesia, Diastolic dysfunction, Dyspnea, Fibromyalgia, GERD (gastroesophageal reflux disease), Hereditary and idiopathic peripheral neuropathy (02/10/2015), Hypertension, Hypothyroidism, Obesity, PONV (postoperative nausea and vomiting), Reflux, Rheumatoid arthritis (HCC), Tachycardia ( ), and Yeast infection. here with:  1.  Peripheral neuropathy 2.  Diplopia 3.  Syncope  -Continue gabapentin  300 mg in the morning and 900 mg at bedtime -Discussed diplopia with Dr. Gregg.  We can do additional blood work MuSK antibodies to rule out myasthenia gravis.  -Patient will be seeing the kidney doctor 06/02/24.  If they clear her to do an MRI brain- I did advise that we can go ahead and order that for her. -Will do EEG due to syncopal event -Advised if symptoms worsen or she develops new symptoms she should let us   know -Follow-up in 8 months or sooner if needed   Duwaine Russell, MSN, NP-C 06/01/2024, 1:11 PM [facility] [address], [address] [place], [place] [address] [phone]

## 2024-06-02 ENCOUNTER — Other Ambulatory Visit: Payer: Self-pay | Admitting: Neurology

## 2024-06-02 ENCOUNTER — Telehealth: Payer: Self-pay | Admitting: Adult Health

## 2024-06-02 DIAGNOSIS — N1831 Chronic kidney disease, stage 3a: Secondary | ICD-10-CM | POA: Diagnosis not present

## 2024-06-02 DIAGNOSIS — E871 Hypo-osmolality and hyponatremia: Secondary | ICD-10-CM | POA: Diagnosis not present

## 2024-06-02 DIAGNOSIS — E876 Hypokalemia: Secondary | ICD-10-CM | POA: Diagnosis not present

## 2024-06-02 DIAGNOSIS — K529 Noninfective gastroenteritis and colitis, unspecified: Secondary | ICD-10-CM | POA: Diagnosis not present

## 2024-06-02 DIAGNOSIS — N1832 Chronic kidney disease, stage 3b: Secondary | ICD-10-CM | POA: Diagnosis not present

## 2024-06-02 NOTE — Progress Notes (Signed)
 I did call the patient on 06/02/2024.  Advise that I had discussed with Dr. Gregg and we reviewed plan of care as outlined in her note.

## 2024-06-02 NOTE — Telephone Encounter (Signed)
 LVM asking patient to call back and schedule EEG.

## 2024-06-02 NOTE — Patient Instructions (Signed)
 Your Plan:  Blood work ordered EKG ordered Continue gabapentin  300 mg in the morning and 900 mg in the evening If your symptoms worsen or you develop new symptoms please let us  know.   Thank you for coming to see us  at [facility]. I hope we have been able to provide you high quality care today.  You may receive a patient satisfaction survey over the next few weeks. We would appreciate your feedback and comments so that we may continue to improve ourselves and the health of our patients.

## 2024-06-03 ENCOUNTER — Telehealth: Payer: Self-pay | Admitting: Adult Health

## 2024-06-03 NOTE — Telephone Encounter (Signed)
 Pt called wanting to inform the provider that her provider Dr. Dennise at [facility] stated that it is ok for her to get an MRI with contrast. Please advise.

## 2024-06-04 ENCOUNTER — Other Ambulatory Visit: Payer: Self-pay | Admitting: Nephrology

## 2024-06-04 DIAGNOSIS — N1831 Chronic kidney disease, stage 3a: Secondary | ICD-10-CM

## 2024-06-07 ENCOUNTER — Encounter: Payer: Self-pay | Admitting: Adult Health

## 2024-06-09 ENCOUNTER — Ambulatory Visit
Admission: RE | Admit: 2024-06-09 | Discharge: 2024-06-09 | Disposition: A | Discharge status: Home / Self Care | Source: Ambulatory Visit | Attending: Nephrology

## 2024-06-09 DIAGNOSIS — N183 Chronic kidney disease, stage 3 unspecified: Secondary | ICD-10-CM | POA: Diagnosis not present

## 2024-06-09 DIAGNOSIS — N1831 Chronic kidney disease, stage 3a: Secondary | ICD-10-CM

## 2024-06-10 ENCOUNTER — Other Ambulatory Visit (HOSPITAL_COMMUNITY): Payer: Self-pay | Admitting: Neurosurgery

## 2024-06-10 ENCOUNTER — Encounter: Payer: Self-pay | Admitting: Adult Health

## 2024-06-10 DIAGNOSIS — D320 Benign neoplasm of cerebral meninges: Secondary | ICD-10-CM

## 2024-06-11 ENCOUNTER — Ambulatory Visit: Admitting: *Deleted

## 2024-06-11 DIAGNOSIS — R55 Syncope and collapse: Secondary | ICD-10-CM

## 2024-06-11 NOTE — Procedures (Signed)
    History:  83 year old woman with syncope   EEG classification: Awake and drowsy  Duration: 26 minutes   Technical aspects: This EEG study was done with scalp electrodes positioned according to the 10-20 International system of electrode placement. Electrical activity was reviewed with band pass filter of 1-70Hz , sensitivity of 7 uV/mm, display speed of 94mm/sec with a 60Hz  notched filter applied as appropriate. EEG data were recorded continuously and digitally stored.   Description of the recording: The background rhythms of this recording consists of a fairly well modulated medium amplitude alpha rhythm of 8.5 Hz that is reactive to eye opening and closure. Present in the anterior head region is a 15-20 Hz beta activity. Photic stimulation was performed, did not show any abnormalities. Hyperventilation was not performed. Drowsiness was not seen. No abnormal epileptiform discharges seen during this recording. There was no focal slowing. There were no electrographic seizure identified.   Abnormality: None   Impression: This is a normal awake EEG. No evidence of interictal epileptiform discharges. Normal EEGs, however, do not rule out epilepsy.    Trinidee Schrag, MD [facility]

## 2024-06-15 ENCOUNTER — Ambulatory Visit (HOSPITAL_COMMUNITY)
Admission: RE | Admit: 2024-06-15 | Discharge: 2024-06-15 | Disposition: A | Discharge status: Home / Self Care | Source: Ambulatory Visit | Attending: Neurosurgery | Admitting: Neurosurgery

## 2024-06-15 DIAGNOSIS — G319 Degenerative disease of nervous system, unspecified: Secondary | ICD-10-CM | POA: Diagnosis not present

## 2024-06-15 DIAGNOSIS — N1831 Chronic kidney disease, stage 3a: Secondary | ICD-10-CM | POA: Diagnosis not present

## 2024-06-15 DIAGNOSIS — I6782 Cerebral ischemia: Secondary | ICD-10-CM | POA: Diagnosis not present

## 2024-06-15 DIAGNOSIS — R220 Localized swelling, mass and lump, head: Secondary | ICD-10-CM | POA: Diagnosis not present

## 2024-06-15 DIAGNOSIS — E7800 Pure hypercholesterolemia, unspecified: Secondary | ICD-10-CM | POA: Diagnosis not present

## 2024-06-15 DIAGNOSIS — E039 Hypothyroidism, unspecified: Secondary | ICD-10-CM | POA: Diagnosis not present

## 2024-06-15 DIAGNOSIS — K52832 Lymphocytic colitis: Secondary | ICD-10-CM | POA: Diagnosis not present

## 2024-06-15 DIAGNOSIS — D320 Benign neoplasm of cerebral meninges: Secondary | ICD-10-CM | POA: Diagnosis not present

## 2024-06-15 DIAGNOSIS — Z713 Dietary counseling and surveillance: Secondary | ICD-10-CM | POA: Diagnosis not present

## 2024-06-15 DIAGNOSIS — E876 Hypokalemia: Secondary | ICD-10-CM | POA: Diagnosis not present

## 2024-06-15 DIAGNOSIS — I10 Essential (primary) hypertension: Secondary | ICD-10-CM | POA: Diagnosis not present

## 2024-06-15 MED ORDER — GADOBUTROL 1 MMOL/ML IV SOLN
7.0000 mL | Freq: Once | INTRAVENOUS | Status: AC | PRN
  Administered 2024-06-15: 7 mL via INTRAVENOUS

## 2024-06-18 ENCOUNTER — Ambulatory Visit: Payer: Self-pay | Admitting: *Deleted

## 2024-06-18 DIAGNOSIS — Z6832 Body mass index (BMI) 32.0-32.9, adult: Secondary | ICD-10-CM | POA: Diagnosis not present

## 2024-06-18 DIAGNOSIS — D320 Benign neoplasm of cerebral meninges: Secondary | ICD-10-CM | POA: Diagnosis not present

## 2024-07-04 ENCOUNTER — Other Ambulatory Visit: Payer: Self-pay | Admitting: Neurology

## 2024-07-07 DIAGNOSIS — K5289 Other specified noninfective gastroenteritis and colitis: Secondary | ICD-10-CM | POA: Diagnosis not present

## 2024-07-07 DIAGNOSIS — K529 Noninfective gastroenteritis and colitis, unspecified: Secondary | ICD-10-CM | POA: Diagnosis not present

## 2024-07-15 DIAGNOSIS — N1831 Chronic kidney disease, stage 3a: Secondary | ICD-10-CM | POA: Diagnosis not present

## 2024-07-17 DIAGNOSIS — M19011 Primary osteoarthritis, right shoulder: Secondary | ICD-10-CM | POA: Diagnosis not present

## 2024-07-22 ENCOUNTER — Ambulatory Visit: Admitting: Physical Medicine and Rehabilitation

## 2024-07-22 DIAGNOSIS — E876 Hypokalemia: Secondary | ICD-10-CM | POA: Diagnosis not present

## 2024-07-22 DIAGNOSIS — E871 Hypo-osmolality and hyponatremia: Secondary | ICD-10-CM | POA: Diagnosis not present

## 2024-07-22 DIAGNOSIS — I129 Hypertensive chronic kidney disease with stage 1 through stage 4 chronic kidney disease, or unspecified chronic kidney disease: Secondary | ICD-10-CM | POA: Diagnosis not present

## 2024-07-22 DIAGNOSIS — N1831 Chronic kidney disease, stage 3a: Secondary | ICD-10-CM | POA: Diagnosis not present

## 2024-07-22 DIAGNOSIS — N182 Chronic kidney disease, stage 2 (mild): Secondary | ICD-10-CM | POA: Diagnosis not present

## 2024-07-23 DIAGNOSIS — Z6833 Body mass index (BMI) 33.0-33.9, adult: Secondary | ICD-10-CM | POA: Diagnosis not present

## 2024-07-23 DIAGNOSIS — D320 Benign neoplasm of cerebral meninges: Secondary | ICD-10-CM | POA: Diagnosis not present

## 2024-07-24 DIAGNOSIS — M19011 Primary osteoarthritis, right shoulder: Secondary | ICD-10-CM | POA: Diagnosis not present

## 2024-08-03 ENCOUNTER — Inpatient Hospital Stay: Attending: Internal Medicine | Admitting: Internal Medicine

## 2024-08-03 VITALS — BP 154/75 | HR 77 | Temp 97.7°F | Resp 18 | Wt 161.0 lb

## 2024-08-03 DIAGNOSIS — Z8041 Family history of malignant neoplasm of ovary: Secondary | ICD-10-CM | POA: Diagnosis not present

## 2024-08-03 DIAGNOSIS — D329 Benign neoplasm of meninges, unspecified: Secondary | ICD-10-CM | POA: Insufficient documentation

## 2024-08-03 DIAGNOSIS — Z803 Family history of malignant neoplasm of breast: Secondary | ICD-10-CM | POA: Insufficient documentation

## 2024-08-03 DIAGNOSIS — G9389 Other specified disorders of brain: Secondary | ICD-10-CM | POA: Diagnosis not present

## 2024-08-03 DIAGNOSIS — Z807 Family history of other malignant neoplasms of lymphoid, hematopoietic and related tissues: Secondary | ICD-10-CM | POA: Diagnosis not present

## 2024-08-03 DIAGNOSIS — Z853 Personal history of malignant neoplasm of breast: Secondary | ICD-10-CM | POA: Insufficient documentation

## 2024-08-03 DIAGNOSIS — Z808 Family history of malignant neoplasm of other organs or systems: Secondary | ICD-10-CM | POA: Insufficient documentation

## 2024-08-03 MED ORDER — LEVETIRACETAM 500 MG PO TABS: 500.0000 mg | ORAL_TABLET | Freq: Two times a day (BID) | ORAL | 2 refills | Status: AC

## 2024-08-03 NOTE — Progress Notes (Signed)
 [facility] at [facility] [address]  [place], [place] [address] [phone]   New Patient Evaluation  Date of Service: 08/03/24 Patient Name: Audrey [name] Patient MRN: [id number] Patient DOB: [birth year] Provider: Arthea MARLA Manns, MD  Identifying Statement:  Audrey [name] is a 83 y.o. 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    Referring Provider: Vernon Velna SAUNDERS, MD [address] [place],  [place] [address]  Primary Cancer:  Oncologic History: Oncology History Overview Note  Breast cancer of upper-outer quadrant of left female breast [facility])   Staging form: Breast, AJCC 7th Edition     Pathologic stage from 11/29/2015: Stage IA (T1c, N0, cM0) - Signed by Onita Mattock, MD on 01/14/2016     Clinical: Stage IA (T1c, N0, M0) - Unsigned      Breast cancer of upper-outer quadrant of left female breast (HCC)  07/18/2014 Procedure   Hereditary cancer panel: no deleterious mutation at ATM, BARD1, BRCA1, BRCA2, BRIP1, CDH1, CHEK2, EPCAM, MLH1, MSH2, MSH6, NBN, PALB2, PMS2, PTEN, RAD51C, RAD51D, STK11, and TP53.   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   12/27/2015 Pathology Results   Second surgery with sentinel lymph node biopsy showed no malignant cells in 5 lymph nodes   01/23/2016 - 02/17/2016 Radiation Therapy   Adjuvant left Breast  radiation: Left breast treated to 42.5 Gy in 17 fractions, Left breast boost treated to 7.5 Gy in 3 fractions     Anti-estrogen oral therapy   She declined adjuvant AI or tamoxifen    05/03/2016 Survivorship   SCP visit completed   10/31/2017 Mammogram   IMPRESSION: There is no mammographic evidence of malignancy.     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  Audrey [name] presents today for evaluation of recently diagnosed meningioma.  She describes roughly one year history of double vision.  She sees objects side by side, worse when looking to the left.  This initially got better, but this summer it has been persistent and not improved despite prism  lenses.  Also describes several episodes this summer of blacking out, including one episode where she was down for unknown period of time (greater than a few seconds) and a more recent episode which was followed by notable confusion and word salad per her son.  No frank seizure or convulsions witnessed.  There have been soft tissue injuries as the result of these falls, currently has scar on forehead.  CNS imaging has demonstrated likely meningioma within skull base which may be growing slowly.    Medications: Current Outpatient Medications on File Prior to Visit  Medication Sig Dispense Refill   acetaminophen  (TYLENOL ) 500  MG tablet Take 1,000 mg by mouth every 6 (six) hours as needed for moderate pain.     b complex vitamins capsule Take 1 capsule by mouth daily.     Boswellia Serrata (BOSWELLIA PO) Take 500 mg by mouth daily.     budesonide (ENTOCORT EC) 3 MG 24 hr capsule Take 3 mg by mouth in the morning, at noon, and at bedtime.     calcium   carbonate (OS-CAL) 1250 (500 Ca) MG chewable tablet Chew 1 tablet by mouth daily.     carvedilol (COREG) 25 MG tablet Take 25 mg by mouth 2 (two) times daily with a meal.     CRANBERRY PO Take 3 tablets by mouth daily at 6 (six) AM.     CREATINE PO Take 5 mg by mouth daily.     diphenhydrAMINE  (BENADRYL ) 50 MG capsule Take 1 hour before contrast media injection. 1 capsule 0   gabapentin  (NEURONTIN ) 300 MG capsule TAKE 1 CAPSULE BY MOUTH IN THE MORNING AND LUNCH. TAKE 4 CAPSULES AT BEDTIME. 180 capsule 0   Glucosamine-Chondroit-Vit C-Mn (GLUCOSAMINE CHONDR 1500 COMPLX) CAPS Take 1 capsule by mouth daily. 1500/1200 tablet daily     levothyroxine  (SYNTHROID ) 25 MCG tablet Take 25 mcg by mouth. Take 1 tablet daily, on MWF and saturday     lisinopril (ZESTRIL) 20 MG tablet Take 20 mg by mouth daily.     loratadine  (CLARITIN ) 10 MG tablet Take 10 mg by mouth daily as needed for allergies.      Multiple Vitamins-Minerals (CENTRUM SILVER 50+WOMEN PO) Take 1 tablet by mouth daily.     Polyethyl Glyc-Propyl Glyc PF (SYSTANE ULTRA PF) 0.4-0.3 % SOLN Place 1 drop into both eyes in the morning.     Methocarbamol  (ROBAXIN  PO) Take 500 mg by mouth as needed (muscle spasms). (Patient not taking: Reported on 08/03/2024)     predniSONE  (DELTASONE ) 50 MG tablet Take 1 tablet by mouth at 13 hours, 7 hours and 1 hour before contrast media injection. (Patient not taking: Reported on 08/03/2024) 3 tablet 0   No current facility-administered medications on file prior to visit.    Allergies:  Allergies  Allergen Reactions   Gadobenate Anaphylaxis and Shortness Of Breath   Iodinated Contrast Media Shortness Of Breath    Other Reaction(s): SOB and Nausea   Other Swelling    Cat gut sutures Cats and dogs                Derma bond- intolerance- itching- tolerates with Benadryl        Amlodipine Other (See Comments)    Made pt overly sleepy stated she could not function   Codeine Other (See Comments)    Drives me  crazy   Cymbalta [Duloxetine Hcl] Diarrhea   Rosuvastatin     Other Reaction(s): myalgia   Trileptal  [Oxcarbazepine ]     Low Sodium    Amoxicillin Nausea And Vomiting and Rash    Other Reaction(s): rash, vomiting   Nitrofurantoin Rash and Itching    Other Reaction(s): Hives, Hives   Past Medical History:  Past Medical History:  Diagnosis Date   Allergy    Arthritis    Bladder infection    hx of last one approx 5 years ago    Breast cancer (HCC) 10/25/2015   left breast  ADH    Cancer (HCC)    breast   Complication of anesthesia    difficulty awakening, patient states she has a small throat   Diastolic dysfunction  Dyspnea    due to pain   Fibromyalgia    GERD (gastroesophageal reflux disease)    Tums if needed   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   Hypertension    Hypothyroidism    Obesity    PONV (postoperative nausea and vomiting)    Reflux    buring and dysphagia sx resp to PPI; egd 3/06 neg for Barretts   Rheumatoid arthritis (HCC)    Tachycardia     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   Yeast infection    hx of    Past Surgical History:  Past Surgical History:  Procedure Laterality Date   AXILLARY LYMPH NODE BIOPSY Left 12/27/2015   Procedure: LEFT AXILLARY SENTINEL LYMPH NODE BIOPSY;  Surgeon: Alm Angle, MD;  Location: [facility];  Service: General;  Laterality: Left;   BELPHAROPTOSIS REPAIR Bilateral    BREAST LUMPECTOMY WITH RADIOACTIVE SEED LOCALIZATION Left 12/01/2015   Procedure: LEFT BREAST BIOPSYWITH RADIOACTIVE SEED LOCALIZATION;  Surgeon: Alm Angle, MD;  Location: [facility] OR;  Service: General;  Laterality: Left;   BREAST SURGERY     CATARACT EXTRACTION, BILATERAL Bilateral 08/2018   COLONOSCOPY  01/2005   NEG   cortisone injections     right arm; back   EYE SURGERY     cosmetic; eyelid surgery bilat    FINGER SURGERY Left    left index finger/ cyst 02/2007   HARDWARE  REMOVAL N/A 06/13/2022   Procedure: Bilateral Thoracic Ten- Removal of Screws;  Surgeon: Onetha Kuba, MD;  Location: [facility] OR;  Service: Neurosurgery;  Laterality: N/A;   KNEE ARTHROSCOPY Right 04/13/2015   Procedure: ARTHROSCOPY RIGHT KNEE WITH MEDIAL MENISCAL DEBRIDEMENT AND CHONDROPLASTY;  Surgeon: Dempsey Moan, MD;  Location: [facility];  Service: Orthopedics;  Laterality: Right;   KYPHOPLASTY N/A 03/16/2020   Procedure: KYPHOPLASTY THORACIC THREE, THORACIC FOUR, THORACIC SIX;  Surgeon: Alix Charleston, MD;  Location: [facility] OR;  Service: Neurosurgery;  Laterality: N/A;   KYPHOPLASTY N/A 06/13/2022   Procedure: Vertobroplasty Thoracic Ten;  Surgeon: Onetha Kuba, MD;  Location: [facility] OR;  Service: Neurosurgery;  Laterality: N/A;   LAMINECTOMY WITH POSTERIOR LATERAL ARTHRODESIS LEVEL 4 N/A 12/20/2021   Procedure: Thoracic Ten-Eleven, Thoracic Eleven-Twelve, Thoracic Twelve-Lumbar One, Lumbar One-Two Posterior Lateral Fusion;  Surgeon: Onetha Kuba, MD;  Location: [facility] OR;  Service: Neurosurgery;  Laterality: N/A;   left rotator cuff repair Left    12/2006   LUMBAR FUSION     x3   LUMBAR LAMINECTOMY     removal of screws  08/2022   TONSILLECTOMY     Social History:  Social History   Socioeconomic History   Marital status: Married    Spouse name: Joe   Number of children: 2   Years of education: 13   Highest education level: Not on file  Occupational History   Occupation: retired  Tobacco Use   Smoking status: Never   Smokeless tobacco: Never  Vaping Use   Vaping status: Never Used  Substance and Sexual Activity   Alcohol  use: No   Drug use: Not Currently    Comment: CBD- last time -10/2021   Sexual activity: Yes  Other Topics Concern   Not on file  Social History Narrative   Patient is right handed.   Patient drinks 2-3 cups of caffeine per week   Social Drivers of Health   Financial Resource Strain: Not on file  Food Insecurity: No Food Insecurity (08/03/2024)   Hunger Vital Sign  Worried About Programme researcher, broadcasting/film/video in the Last Year: Never true    Ran Out of Food in the Last Year: Never true  Transportation Needs: No Transportation Needs (08/03/2024)   PRAPARE - Administrator, Civil Service (Medical): No    Lack of Transportation (Non-Medical): No  Physical Activity: Not on file  Stress: Not on file  Social Connections: Not on file  Intimate Partner Violence: Not At Risk (08/03/2024)   Humiliation, Afraid, Rape, and Kick questionnaire    Fear of Current or Ex-Partner: No    Emotionally Abused: No    Physically Abused: No    Sexually Abused: No   Family History:  Family History  Problem Relation Age of Onset   Diabetes Mother    Breast cancer Mother        dx. late 70s-early 31s; metastasis to throat?   Hodgkin's lymphoma Father 37   Breast cancer Sister        maternal half-sister dx. w/ DCIS in her late 78s;   Other Sister        learning disabilities; lives on her own   Diabetes Sister    Alcohol  abuse Brother    Breast cancer Maternal Aunt        dx. 50s   Ovarian cancer Maternal Aunt 59   Breast cancer Paternal Aunt        dx. early 8s   Heart Problems Paternal Aunt    Stroke Maternal Grandfather    Depression Paternal Grandmother    Heart Problems Paternal Grandmother    Skin cancer Daughter        dx. early 4s   Other Daughter        insterstitial cystitis   Neuropathy Neg Hx     Review of Systems: Constitutional: Doesn't report fevers, chills or abnormal weight loss Eyes: Doesn't report blurriness of vision Ears, nose, mouth, throat, and face: Doesn't report sore throat Respiratory: Doesn't report cough, dyspnea or wheezes Cardiovascular: Doesn't report palpitation, chest discomfort  Gastrointestinal:  Doesn't report nausea, constipation, diarrhea GU: Doesn't report incontinence Skin: Doesn't report skin rashes Neurological: Per HPI Musculoskeletal: Doesn't report joint pain Behavioral/Psych: Doesn't report  anxiety  Physical Exam: Vitals:   08/03/24 1428 08/03/24 1436  BP: (!) 161/81 (!) 154/75  Pulse: 77   Resp: 18   Temp: 97.7 F (36.5 C)   SpO2: 96%    KPS: 80. General: Alert, cooperative, pleasant, in no acute distress Head: Normal EENT: No conjunctival injection or scleral icterus.  Lungs: Resp effort normal Cardiac: Regular rate Abdomen: Non-distended abdomen Skin: No rashes cyanosis or petechiae. Extremities: No clubbing or edema  Neurologic Exam: Mental Status: Awake, alert, attentive to examiner. Oriented to self and environment. Language is fluent with intact comprehension.  Cranial Nerves: Visual acuity is grossly normal. Visual fields are full. Frank L abducens paresis with diplopia. No ptosis. Face is symmetric Motor: Tone and bulk are normal. Power is full in both arms and legs. Reflexes are symmetric, no pathologic reflexes present.  Sensory: Intact to light touch Gait: Orthopedic limitation   Labs: I have reviewed the data as listed    Component Value Date/Time   NA 139 04/30/2024 1603   NA 133 (L) 06/24/2023 1205   NA 141 11/30/2016 1358   K 3.3 (L) 04/30/2024 1603   K 3.5 11/30/2016 1358   CL 102 04/30/2024 1603   CO2 26 04/30/2024 1603   CO2 29 11/30/2016 1358   GLUCOSE 111 (H)  04/30/2024 1603   GLUCOSE 103 11/30/2016 1358   BUN 19 04/30/2024 1603   BUN 18 06/24/2023 1205   BUN 14.2 11/30/2016 1358   CREATININE 1.27 (H) 04/30/2024 1603   CREATININE 0.84 10/11/2022 1357   CREATININE 0.8 11/30/2016 1358   CALCIUM  9.5 04/30/2024 1603   CALCIUM  9.5 11/30/2016 1358   PROT 6.2 (L) 04/30/2024 1603   PROT 7.1 11/30/2016 1358   ALBUMIN  3.2 (L) 04/30/2024 1603   ALBUMIN  4.0 11/30/2016 1358   AST 20 04/30/2024 1603   AST 20 10/11/2022 1357   AST 25 11/30/2016 1358   ALT 13 04/30/2024 1603   ALT 15 10/11/2022 1357   ALT 20 11/30/2016 1358   ALKPHOS 44 04/30/2024 1603   ALKPHOS 71 11/30/2016 1358   BILITOT 0.7 04/30/2024 1603   BILITOT 0.7 10/11/2022  1357   BILITOT 0.55 11/30/2016 1358   GFRNONAA 43 (L) 04/30/2024 1603   GFRNONAA >60 10/11/2022 1357   GFRAA >60 03/16/2020 0717   Lab Results  Component Value Date   WBC 10.7 (H) 04/30/2024   NEUTROABS 7.5 04/30/2024   HGB 13.6 04/30/2024   HCT 39.5 04/30/2024   MCV 95.2 04/30/2024   PLT 291 04/30/2024    Imaging: CLINICAL DATA:  Provided history: Cerebral meningioma.   EXAM: MRI HEAD WITHOUT AND WITH CONTRAST   TECHNIQUE: Multiplanar, multiecho pulse sequences of the brain and surrounding structures were obtained without and with intravenous contrast.   CONTRAST:  7mL GADAVIST  GADOBUTROL  1 MMOL/ML IV SOLN   COMPARISON:  Head CT 04/30/2024.  Brain MRI 09/06/2023.   FINDINGS: Brain:   Mild generalized cerebral atrophy.   1.8 x 1.5 x 1.5 cm enhancing extra-axial mass within the prepontine cistern on the left, extending along the undersurface of the tentorium and along the petroclinoid ligament. This is most consistent with a meningioma. The mass has increased in size since the MRI of 09/06/2023 (previously 1.4 x 1.4 x 1.2 cm). The mass extends into [employer], as before. Mild mass effect upon the ventral aspect of the pons (without underlying parenchymal edema).   Multifocal T2 FLAIR hyperintense signal abnormality within the cerebral white matter, nonspecific but compatible with advanced chronic small vessel ischemic disease.   No cortical encephalomalacia is identified.   There is no acute infarct.   No chronic intracranial blood products.   No extra-axial fluid collection.   No midline shift.   Vascular: Maintained flow voids within the proximal large arterial vessels.   Skull and upper cervical spine: No focal worrisome marrow lesion. C4-C5 grade 1 anterolisthesis.   Sinuses/Orbits: No mass or acute finding within the imaged orbits. Prior bilateral ocular lens replacement. Small mucous retention cyst within the right maxillary sinus.    IMPRESSION: 1. 1.8 x 1.5 cm meningioma within the prepontine cistern on the left, extending along the undersurface of the tentorium and along the petroclinoid ligament. The mass has increased in size since the MRI of 09/06/2023 (previously 1.4 x 1.4 cm). Extension into Meckel's cave and mild mass effect upon the ventral pons (without underlying parenchymal edema). 2. Advanced chronic small vessel ischemic changes within the cerebral white matter. 3. Mild generalized cerebral atrophy. 4. Small right maxillary sinus mucous retention cyst.   Electronically Signed   By: Rockey Childs D.O.   On: 06/22/2024 07:27   Assessment/Plan Meningioma [facility])  Rilla HERO Toney presents with clinical and radiographic syndrome localizing to the skull base, left abducens nerve.  Etiology is likely meningioma, which is growing modestly  over the past 1 year.    There is an additional question regarding her episodes of LOC; there is reasonable suspicion these could be epileptic in etiology.  This is based on aspects of semiology (post event word salad), and tumor touching the dominant mesial temporal lobe.  If so, this would link the LOC episodes with the tumor syndrome and enlarge the total symptom burden.  We will review her case in CNS tumor board.  She understands because of the symptom burden and growing tumor, it is very likely that radiation will be recommended, either SRS or external beam.  Not likely a great surgical candidate based on tumor location, patient and age and comorbidity.   Also discussed trial of Keppra  500mg  BID given concern for seizures and morbidity of LOC events.  She is agreeable with a trial of this medication, understanding there is no diagnosis yet at this time of focal seizures.    We spent twenty additional minutes teaching regarding the natural history, biology, and historical experience in the treatment of neurologic complications of cancer.   We appreciate the  opportunity to participate in the care of Tenisha M Antu.  We will give her a call in 1 week to review tumor board discussion.  We are also happy [place], based on her plans to move there shortly.    All questions were answered. The patient knows to call the clinic with any problems, questions or concerns. No barriers to learning were detected.  The total time spent in the encounter was 60 minutes and more than 50% was on counseling and review of test results   Arthea MARLA Manns, MD Medical Director of Neuro-Oncology [facility] 08/03/24 3:55 PM

## 2024-08-04 ENCOUNTER — Other Ambulatory Visit: Payer: Self-pay | Admitting: Radiation Therapy

## 2024-08-06 NOTE — Telephone Encounter (Signed)
 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  Ok to schedule.

## 2024-08-08 ENCOUNTER — Other Ambulatory Visit: Payer: Self-pay | Admitting: Neurology

## 2024-08-10 ENCOUNTER — Inpatient Hospital Stay

## 2024-08-10 ENCOUNTER — Telehealth: Payer: Self-pay | Admitting: *Deleted

## 2024-08-10 ENCOUNTER — Inpatient Hospital Stay (HOSPITAL_BASED_OUTPATIENT_CLINIC_OR_DEPARTMENT_OTHER): Admitting: Internal Medicine

## 2024-08-10 DIAGNOSIS — D329 Benign neoplasm of meninges, unspecified: Secondary | ICD-10-CM

## 2024-08-10 NOTE — Telephone Encounter (Signed)
 Referral, patient demographic sheet, office notes from 08/03/24 & 08/10/24 faxed to [facility] - [phone].  Fax confirmation received.

## 2024-08-10 NOTE — Progress Notes (Signed)
 I connected with Audrey [name] on 08/10/24 at  2:30 PM EDT by telephone visit and verified that I am speaking with the correct person using two identifiers.  I discussed the limitations, risks, security and privacy concerns of performing an evaluation and management service by telemedicine and the availability of in-person appointments. I also discussed with the patient that there may be a patient responsible charge related to this service. The patient expressed understanding and agreed to proceed.  Other persons participating in the visit and their role in the encounter:  n/a   Patient's location:  Home Provider's location:  Office Chief Complaint:  Meningioma [facility])  History of Present Ilness: Audrey [name] reports no clinical changes today.  She did not tolerate the trial of Keppra  well, this led to significant dizziness, was self dc'd.  No further episodes of loss of consciousness since our visit.    Observations: Language and cognition at baseline  Assessment and Plan: Meningioma [facility])  Reviewed our discussion from CNS tumor board meeting this morning.  We did recommend proceeding with radiation therapy, either fractionated SRS or conventional external beam over 6 weeks.    Because of her upcoming move she would [place] get [place].  We are happy to refer her [facility] for radiation consult.  Follow Up Instructions: RTC as needed if returning to [place]  I discussed the assessment and treatment plan with the patient.  The patient was provided an opportunity to ask questions and all were answered.  The patient agreed with the plan and demonstrated understanding of the instructions.    The patient was advised to call back or seek an in-person evaluation if the symptoms worsen or if the condition fails to improve as anticipated.    Hyde Sires K Alireza Pollack, MD   I provided 20 minutes of non face-to-face telephone visit time during this encounter, and > 50% was spent  counseling as documented under my assessment & plan.

## 2024-08-11 DIAGNOSIS — N182 Chronic kidney disease, stage 2 (mild): Secondary | ICD-10-CM | POA: Diagnosis not present

## 2024-08-14 ENCOUNTER — Telehealth: Payer: Self-pay | Admitting: *Deleted

## 2024-08-14 NOTE — Telephone Encounter (Signed)
 Received vm message from pt stating she wanted to let Dr. Buckley know that she has an appt with Dr. Michaela on 08/18/24.

## 2024-08-18 DIAGNOSIS — D320 Benign neoplasm of cerebral meninges: Secondary | ICD-10-CM | POA: Diagnosis not present

## 2024-08-31 DIAGNOSIS — D320 Benign neoplasm of cerebral meninges: Secondary | ICD-10-CM | POA: Diagnosis not present

## 2024-08-31 DIAGNOSIS — G9389 Other specified disorders of brain: Secondary | ICD-10-CM | POA: Diagnosis not present

## 2024-09-10 DIAGNOSIS — M19011 Primary osteoarthritis, right shoulder: Secondary | ICD-10-CM | POA: Diagnosis not present

## 2024-09-10 DIAGNOSIS — E039 Hypothyroidism, unspecified: Secondary | ICD-10-CM | POA: Diagnosis not present

## 2024-09-10 DIAGNOSIS — Z0000 Encounter for general adult medical examination without abnormal findings: Secondary | ICD-10-CM | POA: Diagnosis not present

## 2024-09-10 DIAGNOSIS — Z1331 Encounter for screening for depression: Secondary | ICD-10-CM | POA: Diagnosis not present

## 2024-09-10 DIAGNOSIS — I10 Essential (primary) hypertension: Secondary | ICD-10-CM | POA: Diagnosis not present

## 2024-09-10 DIAGNOSIS — K5289 Other specified noninfective gastroenteritis and colitis: Secondary | ICD-10-CM | POA: Diagnosis not present

## 2024-09-10 DIAGNOSIS — Z23 Encounter for immunization: Secondary | ICD-10-CM | POA: Diagnosis not present

## 2024-09-10 DIAGNOSIS — E7800 Pure hypercholesterolemia, unspecified: Secondary | ICD-10-CM | POA: Diagnosis not present

## 2024-09-10 DIAGNOSIS — M810 Age-related osteoporosis without current pathological fracture: Secondary | ICD-10-CM | POA: Diagnosis not present

## 2024-09-10 DIAGNOSIS — M059 Rheumatoid arthritis with rheumatoid factor, unspecified: Secondary | ICD-10-CM | POA: Diagnosis not present

## 2024-09-15 DIAGNOSIS — D320 Benign neoplasm of cerebral meninges: Secondary | ICD-10-CM | POA: Diagnosis not present

## 2024-09-22 ENCOUNTER — Other Ambulatory Visit: Payer: Self-pay | Admitting: Medical Genetics

## 2024-09-22 DIAGNOSIS — D320 Benign neoplasm of cerebral meninges: Secondary | ICD-10-CM | POA: Diagnosis not present

## 2024-09-22 DIAGNOSIS — Z006 Encounter for examination for normal comparison and control in clinical research program: Secondary | ICD-10-CM

## 2024-09-23 DIAGNOSIS — D320 Benign neoplasm of cerebral meninges: Secondary | ICD-10-CM | POA: Diagnosis not present

## 2024-09-24 DIAGNOSIS — D320 Benign neoplasm of cerebral meninges: Secondary | ICD-10-CM | POA: Diagnosis not present

## 2024-09-25 DIAGNOSIS — D320 Benign neoplasm of cerebral meninges: Secondary | ICD-10-CM | POA: Diagnosis not present

## 2024-09-28 ENCOUNTER — Encounter: Payer: Self-pay | Admitting: Radiology

## 2024-09-28 DIAGNOSIS — D320 Benign neoplasm of cerebral meninges: Secondary | ICD-10-CM | POA: Diagnosis not present

## 2024-09-29 DIAGNOSIS — D320 Benign neoplasm of cerebral meninges: Secondary | ICD-10-CM | POA: Diagnosis not present

## 2024-09-30 DIAGNOSIS — D320 Benign neoplasm of cerebral meninges: Secondary | ICD-10-CM | POA: Diagnosis not present

## 2024-10-01 DIAGNOSIS — D320 Benign neoplasm of cerebral meninges: Secondary | ICD-10-CM | POA: Diagnosis not present

## 2024-10-02 DIAGNOSIS — D320 Benign neoplasm of cerebral meninges: Secondary | ICD-10-CM | POA: Diagnosis not present

## 2024-10-05 DIAGNOSIS — D320 Benign neoplasm of cerebral meninges: Secondary | ICD-10-CM | POA: Diagnosis not present

## 2024-10-06 DIAGNOSIS — D320 Benign neoplasm of cerebral meninges: Secondary | ICD-10-CM | POA: Diagnosis not present

## 2024-10-07 DIAGNOSIS — D320 Benign neoplasm of cerebral meninges: Secondary | ICD-10-CM | POA: Diagnosis not present

## 2024-10-08 DIAGNOSIS — D320 Benign neoplasm of cerebral meninges: Secondary | ICD-10-CM | POA: Diagnosis not present

## 2024-10-09 DIAGNOSIS — D320 Benign neoplasm of cerebral meninges: Secondary | ICD-10-CM | POA: Diagnosis not present

## 2024-10-12 DIAGNOSIS — D320 Benign neoplasm of cerebral meninges: Secondary | ICD-10-CM | POA: Diagnosis not present

## 2024-10-12 DIAGNOSIS — M19011 Primary osteoarthritis, right shoulder: Secondary | ICD-10-CM | POA: Diagnosis not present

## 2024-10-12 DIAGNOSIS — M25511 Pain in right shoulder: Secondary | ICD-10-CM | POA: Diagnosis not present

## 2024-10-13 DIAGNOSIS — D320 Benign neoplasm of cerebral meninges: Secondary | ICD-10-CM | POA: Diagnosis not present

## 2024-10-14 DIAGNOSIS — D320 Benign neoplasm of cerebral meninges: Secondary | ICD-10-CM | POA: Diagnosis not present

## 2024-10-15 DIAGNOSIS — D320 Benign neoplasm of cerebral meninges: Secondary | ICD-10-CM | POA: Diagnosis not present

## 2024-10-16 DIAGNOSIS — D320 Benign neoplasm of cerebral meninges: Secondary | ICD-10-CM | POA: Diagnosis not present

## 2024-10-18 DIAGNOSIS — D320 Benign neoplasm of cerebral meninges: Secondary | ICD-10-CM | POA: Diagnosis not present

## 2024-10-19 DIAGNOSIS — D320 Benign neoplasm of cerebral meninges: Secondary | ICD-10-CM | POA: Diagnosis not present

## 2024-10-20 DIAGNOSIS — D320 Benign neoplasm of cerebral meninges: Secondary | ICD-10-CM | POA: Diagnosis not present

## 2024-10-21 DIAGNOSIS — D320 Benign neoplasm of cerebral meninges: Secondary | ICD-10-CM | POA: Diagnosis not present

## 2024-10-26 DIAGNOSIS — D320 Benign neoplasm of cerebral meninges: Secondary | ICD-10-CM | POA: Diagnosis not present

## 2024-10-27 DIAGNOSIS — D320 Benign neoplasm of cerebral meninges: Secondary | ICD-10-CM | POA: Diagnosis not present

## 2024-10-28 DIAGNOSIS — D320 Benign neoplasm of cerebral meninges: Secondary | ICD-10-CM | POA: Diagnosis not present

## 2024-10-29 DIAGNOSIS — D320 Benign neoplasm of cerebral meninges: Secondary | ICD-10-CM | POA: Diagnosis not present

## 2024-10-30 DIAGNOSIS — D320 Benign neoplasm of cerebral meninges: Secondary | ICD-10-CM | POA: Diagnosis not present

## 2024-10-30 DIAGNOSIS — I509 Heart failure, unspecified: Secondary | ICD-10-CM | POA: Diagnosis not present

## 2024-10-30 DIAGNOSIS — G629 Polyneuropathy, unspecified: Secondary | ICD-10-CM | POA: Diagnosis not present

## 2024-11-02 ENCOUNTER — Telehealth: Payer: Self-pay

## 2024-11-02 NOTE — Telephone Encounter (Signed)
Faxed to [facility].

## 2024-11-05 DIAGNOSIS — R42 Dizziness and giddiness: Secondary | ICD-10-CM | POA: Diagnosis not present

## 2024-11-05 DIAGNOSIS — R55 Syncope and collapse: Secondary | ICD-10-CM | POA: Diagnosis not present

## 2024-11-05 DIAGNOSIS — M19011 Primary osteoarthritis, right shoulder: Secondary | ICD-10-CM | POA: Diagnosis not present

## 2024-11-05 DIAGNOSIS — R8290 Unspecified abnormal findings in urine: Secondary | ICD-10-CM | POA: Diagnosis not present

## 2024-11-09 ENCOUNTER — Telehealth: Payer: Self-pay

## 2024-11-09 DIAGNOSIS — M1612 Unilateral primary osteoarthritis, left hip: Secondary | ICD-10-CM | POA: Diagnosis not present

## 2024-11-09 DIAGNOSIS — W1839XA Other fall on same level, initial encounter: Secondary | ICD-10-CM | POA: Diagnosis not present

## 2024-11-09 DIAGNOSIS — R0789 Other chest pain: Secondary | ICD-10-CM | POA: Diagnosis not present

## 2024-11-09 DIAGNOSIS — S2242XA Multiple fractures of ribs, left side, initial encounter for closed fracture: Secondary | ICD-10-CM | POA: Diagnosis not present

## 2024-11-09 DIAGNOSIS — Y92092 Bedroom in other non-institutional residence as the place of occurrence of the external cause: Secondary | ICD-10-CM | POA: Diagnosis not present

## 2024-11-09 NOTE — Telephone Encounter (Signed)
 Received confirmation of successful fax transmission of signed surgical clearance.

## 2024-11-13 ENCOUNTER — Other Ambulatory Visit: Payer: Self-pay | Admitting: *Deleted

## 2024-11-13 ENCOUNTER — Telehealth: Payer: Self-pay | Admitting: Neurology

## 2024-11-13 MED ORDER — GABAPENTIN 300 MG PO CAPS: ORAL_CAPSULE | ORAL | 2 refills | Status: AC

## 2024-11-13 NOTE — Telephone Encounter (Signed)
 Pt is requesting a refill for gabapentin  (NEURONTIN ) 300 MG capsule.  Pharmacy: [facility] Store ID: [phone]

## 2024-11-18 ENCOUNTER — Other Ambulatory Visit (HOSPITAL_COMMUNITY): Payer: Self-pay | Admitting: Student

## 2024-11-18 DIAGNOSIS — D320 Benign neoplasm of cerebral meninges: Secondary | ICD-10-CM
